# Patient Record
Sex: Male | Born: 1960 | Race: White | Hispanic: No | Marital: Married | Smoking: Former smoker
Health system: Southern US, Community
[De-identification: ages and names within clinical notes are randomized; demographics above are authoritative.]

## PROBLEM LIST (undated history)

## (undated) DIAGNOSIS — C801 Malignant (primary) neoplasm, unspecified: Secondary | ICD-10-CM

## (undated) DIAGNOSIS — Z803 Family history of malignant neoplasm of breast: Secondary | ICD-10-CM

## (undated) DIAGNOSIS — Z800 Family history of malignant neoplasm of digestive organs: Secondary | ICD-10-CM

## (undated) DIAGNOSIS — Z8371 Family history of colonic polyps: Secondary | ICD-10-CM

## (undated) DIAGNOSIS — K219 Gastro-esophageal reflux disease without esophagitis: Secondary | ICD-10-CM

## (undated) DIAGNOSIS — Z83719 Family history of colon polyps, unspecified: Secondary | ICD-10-CM

## (undated) DIAGNOSIS — Z8042 Family history of malignant neoplasm of prostate: Secondary | ICD-10-CM

## (undated) DIAGNOSIS — F419 Anxiety disorder, unspecified: Secondary | ICD-10-CM

## (undated) HISTORY — DX: Family history of malignant neoplasm of prostate: Z80.42

## (undated) HISTORY — DX: Family history of colon polyps, unspecified: Z83.719

## (undated) HISTORY — DX: Family history of colonic polyps: Z83.71

## (undated) HISTORY — DX: Family history of malignant neoplasm of digestive organs: Z80.0

## (undated) HISTORY — PX: VASECTOMY: SHX75

## (undated) HISTORY — DX: Family history of malignant neoplasm of breast: Z80.3

---

## 2018-06-06 DIAGNOSIS — C801 Malignant (primary) neoplasm, unspecified: Secondary | ICD-10-CM

## 2018-06-06 HISTORY — DX: Malignant (primary) neoplasm, unspecified: C80.1

## 2018-06-07 ENCOUNTER — Inpatient Hospital Stay (HOSPITAL_COMMUNITY)
Admission: EM | Admit: 2018-06-07 | Discharge: 2018-06-11 | DRG: 435 | Disposition: A | Discharge status: Home / Self Care | Payer: Managed Care, Other (non HMO) | Attending: Family Medicine | Admitting: Family Medicine

## 2018-06-07 ENCOUNTER — Encounter (HOSPITAL_COMMUNITY): Payer: Self-pay | Admitting: Emergency Medicine

## 2018-06-07 ENCOUNTER — Emergency Department (HOSPITAL_COMMUNITY): Payer: Managed Care, Other (non HMO)

## 2018-06-07 DIAGNOSIS — F17210 Nicotine dependence, cigarettes, uncomplicated: Secondary | ICD-10-CM | POA: Diagnosis present

## 2018-06-07 DIAGNOSIS — L298 Other pruritus: Secondary | ICD-10-CM | POA: Diagnosis present

## 2018-06-07 DIAGNOSIS — Z720 Tobacco use: Secondary | ICD-10-CM

## 2018-06-07 DIAGNOSIS — C250 Malignant neoplasm of head of pancreas: Secondary | ICD-10-CM | POA: Diagnosis not present

## 2018-06-07 DIAGNOSIS — Z800 Family history of malignant neoplasm of digestive organs: Secondary | ICD-10-CM

## 2018-06-07 DIAGNOSIS — K831 Obstruction of bile duct: Secondary | ICD-10-CM | POA: Diagnosis present

## 2018-06-07 DIAGNOSIS — Z23 Encounter for immunization: Secondary | ICD-10-CM

## 2018-06-07 DIAGNOSIS — T508X5A Adverse effect of diagnostic agents, initial encounter: Secondary | ICD-10-CM | POA: Diagnosis present

## 2018-06-07 DIAGNOSIS — R17 Unspecified jaundice: Secondary | ICD-10-CM | POA: Diagnosis present

## 2018-06-07 DIAGNOSIS — K8689 Other specified diseases of pancreas: Secondary | ICD-10-CM

## 2018-06-07 DIAGNOSIS — K859 Acute pancreatitis without necrosis or infection, unspecified: Secondary | ICD-10-CM | POA: Diagnosis present

## 2018-06-07 LAB — GAMMA GT: GGT: 1038 U/L — ABNORMAL HIGH (ref 7–50)

## 2018-06-07 LAB — COMPREHENSIVE METABOLIC PANEL
ALT: 541 U/L — ABNORMAL HIGH (ref 0–44)
ANION GAP: 8 (ref 5–15)
AST: 232 U/L — ABNORMAL HIGH (ref 15–41)
Albumin: 3.7 g/dL (ref 3.5–5.0)
Alkaline Phosphatase: 306 U/L — ABNORMAL HIGH (ref 38–126)
BUN: 9 mg/dL (ref 6–20)
CHLORIDE: 108 mmol/L (ref 98–111)
CO2: 22 mmol/L (ref 22–32)
Calcium: 9.3 mg/dL (ref 8.9–10.3)
Creatinine, Ser: 0.77 mg/dL (ref 0.61–1.24)
GFR calc non Af Amer: >60 mL/min (ref >60)
Glucose, Bld: 115 mg/dL — ABNORMAL HIGH (ref 70–99)
Potassium: 3.9 mmol/L (ref 3.5–5.1)
SODIUM: 138 mmol/L (ref 135–145)
Total Bilirubin: 10.7 mg/dL — ABNORMAL HIGH (ref 0.3–1.2)
Total Protein: 7.2 g/dL (ref 6.5–8.1)

## 2018-06-07 LAB — CBC
HEMATOCRIT: 44.5 % (ref 39.0–52.0)
Hemoglobin: 14.6 g/dL (ref 13.0–17.0)
MCH: 30 pg (ref 26.0–34.0)
MCHC: 32.8 g/dL (ref 30.0–36.0)
MCV: 91.6 fL (ref 80.0–100.0)
NRBC: 0 % (ref 0.0–0.2)
Platelets: 174 10*3/uL (ref 150–400)
RBC: 4.86 MIL/uL (ref 4.22–5.81)
RDW: 14.2 % (ref 11.5–15.5)
WBC: 5 10*3/uL (ref 4.0–10.5)

## 2018-06-07 LAB — BILIRUBIN, FRACTIONATED(TOT/DIR/INDIR)
BILIRUBIN DIRECT: 6.9 mg/dL — AB (ref 0.0–0.2)
BILIRUBIN INDIRECT: 4.3 mg/dL — AB (ref 0.3–0.9)
Total Bilirubin: 11.2 mg/dL — ABNORMAL HIGH (ref 0.3–1.2)

## 2018-06-07 LAB — URINALYSIS, ROUTINE W REFLEX MICROSCOPIC
BACTERIA UA: NONE SEEN
Bilirubin Urine: NEGATIVE
Glucose, UA: NEGATIVE mg/dL
Hgb urine dipstick: NEGATIVE
Ketones, ur: NEGATIVE mg/dL
LEUKOCYTES UA: NEGATIVE
Nitrite: NEGATIVE
PROTEIN: NEGATIVE mg/dL
Specific Gravity, Urine: 1.004 — ABNORMAL LOW (ref 1.005–1.030)
pH: 5 (ref 5.0–8.0)

## 2018-06-07 LAB — PROTIME-INR
INR: 0.98
Prothrombin Time: 12.9 seconds (ref 11.4–15.2)

## 2018-06-07 LAB — LIPASE, BLOOD: LIPASE: 41 U/L (ref 11–51)

## 2018-06-07 MED ORDER — METHYLPREDNISOLONE SODIUM SUCC 125 MG IJ SOLR
125.0000 mg | Freq: Once | INTRAMUSCULAR | Status: AC
  Administered 2018-06-07: 125 mg via INTRAVENOUS
  Filled 2018-06-07: qty 2

## 2018-06-07 MED ORDER — DIPHENHYDRAMINE HCL 50 MG/ML IJ SOLN
50.0000 mg | Freq: Once | INTRAMUSCULAR | Status: AC
  Administered 2018-06-07: 50 mg via INTRAVENOUS
  Filled 2018-06-07: qty 1

## 2018-06-07 MED ORDER — SODIUM CHLORIDE 0.9 % IV BOLUS
1000.0000 mL | Freq: Once | INTRAVENOUS | Status: AC
  Administered 2018-06-07: 1000 mL via INTRAVENOUS

## 2018-06-07 MED ORDER — IOHEXOL 300 MG/ML  SOLN
100.0000 mL | Freq: Once | INTRAMUSCULAR | Status: AC | PRN
  Administered 2018-06-07: 100 mL via INTRAVENOUS

## 2018-06-07 NOTE — ED Notes (Signed)
Family at bedside. 

## 2018-06-07 NOTE — ED Provider Notes (Signed)
[facility] EMERGENCY DEPARTMENT Provider Note   CSN: [id number] Arrival date & time: 06/07/18  1524     History   Chief Complaint Chief Complaint  Patient presents with  . Abdominal Pain    HPI Ernest Wu is a 57 y.o. male.  Patient is a 57 year old male presenting with 1 week of orange-tinged urine and concerns of jaundice after being seen at a UC.  Patient has an unremarkable medical history.  Patient is presenting today with acute onset jaundice over the last 24 hours.  He reports 2 weeks of generalized fatigue in 1 week of vague abdominal cramping, diarrhea and orange-tinged urine.  Patient also reports a 10 pound weight loss over unspecified time since patient does not "check his weight often."  He has diarrhea about 4 times per day but denies nausea or vomiting.  He does report some subjective fevers and chills but denies cough or hemoptysis.  He recently established with a PCP but no prior care during the majority of his lifetime.  Patient does report significant alcohol intake with approximately 4-6 beers daily.  He denies history of withdrawal.  Patient is a current everyday smoker with 1 pack/day since age 31.  He denies current or prior history of illicit drug use or IV drug use.  Patient does not have history of tattoos.  He has no recent travel outside of [place].  He currently works as a Games developer.  He denies prior history of jaundice, even as a child.     History reviewed. No pertinent past medical history.  Patient Active Problem List   Diagnosis Date Noted  . Jaundice 06/07/2018    Past Surgical History:  Procedure Laterality Date  . VASECTOMY          Home Medications    Prior to Admission medications   Medication Sig Start Date End Date Taking? Authorizing Provider  ibuprofen (ADVIL,MOTRIN) 200 MG tablet Take 800 mg by mouth every 6 (six) hours as needed (for pain or headaches).   Yes [provider]  omeprazole  (PRILOSEC OTC) 20 MG tablet Take 20 mg by mouth daily as needed (for heartburn).   Yes [provider]    Family History History reviewed. No pertinent family history.  Social History Social History   Tobacco Use  . Smoking status: Current Every Day Smoker    Packs/day: 1.00  . Smokeless tobacco: Never Used  Substance Use Topics  . Alcohol use: Yes    Alcohol/week: 4.0 - 5.0 standard drinks    Types: 4 - 5 Cans of beer per week  . Drug use: Never     Allergies   Iohexol and Contrast media [iodinated diagnostic agents]   Review of Systems Review of Systems  Constitutional: Positive for appetite change, chills, fatigue, fever and unexpected weight change.  HENT: Negative for congestion and sore throat.   Eyes: Negative for photophobia and visual disturbance.  Respiratory: Negative for cough and shortness of breath.   Cardiovascular: Negative for chest pain and palpitations.  Gastrointestinal: Positive for abdominal pain, blood in stool and diarrhea. Negative for nausea and vomiting.  Genitourinary: Positive for frequency. Negative for dysuria and hematuria.  Musculoskeletal: Negative for myalgias and neck pain.  Skin: Positive for color change. Negative for rash.  Neurological: Positive for headaches. Negative for dizziness, syncope and light-headedness.     Physical Exam Updated Vital Signs BP 139/84   Pulse 87   Temp 98.2 F (36.8 C) (Oral)  Resp 18   SpO2 99%   Physical Exam  Constitutional: He is oriented to person, place, and time. He appears well-developed and well-nourished.  Non-toxic appearance. No distress.  HENT:  Head: Normocephalic and atraumatic.  Eyes: Conjunctivae are normal. Scleral icterus is present.  Neck: Neck supple.  Cardiovascular: Normal rate and regular rhythm.  No murmur heard. Pulmonary/Chest: Effort normal and breath sounds normal. No respiratory distress.  Abdominal: Soft. Bowel sounds are normal. He exhibits distension.  He exhibits no mass. There is no hepatosplenomegaly. There is no tenderness. There is no rebound, no guarding, no tenderness at McBurney's point and negative Murphy's sign.  Musculoskeletal: He exhibits no edema.  Neurological: He is alert and oriented to person, place, and time.  Skin: Skin is warm and dry.  Jaundice  Psychiatric: He has a normal mood and affect.  Nursing note and vitals reviewed.    ED Treatments / Results  Labs (all labs ordered are listed, but only abnormal results are displayed) Labs Reviewed  COMPREHENSIVE METABOLIC PANEL - Abnormal; Notable for the following components:      Result Value   Glucose, Bld 115 (*)    AST 232 (*)    ALT 541 (*)    Alkaline Phosphatase 306 (*)    Total Bilirubin 10.7 (*)    All other components within normal limits  URINALYSIS, ROUTINE W REFLEX MICROSCOPIC - Abnormal; Notable for the following components:   Color, Urine AMBER (*)    Specific Gravity, Urine 1.004 (*)    All other components within normal limits  BILIRUBIN, FRACTIONATED(TOT/DIR/INDIR) - Abnormal; Notable for the following components:   Total Bilirubin 11.2 (*)    Bilirubin, Direct 6.9 (*)    Indirect Bilirubin 4.3 (*)    All other components within normal limits  GAMMA GT - Abnormal; Notable for the following components:   GGT 1,038 (*)    All other components within normal limits  LIPASE, BLOOD  CBC  PROTIME-INR  HEPATITIS PANEL, ACUTE  HIV ANTIBODY (ROUTINE TESTING W REFLEX)    EKG None  Radiology No results found.  Procedures Procedures (including critical care time)  Medications Ordered in ED Medications  iohexol (OMNIPAQUE) 300 MG/ML solution 100 mL (100 mLs Intravenous Contrast Given 06/07/18 2239)  diphenhydrAMINE (BENADRYL) injection 50 mg (50 mg Intravenous Given 06/07/18 2303)  methylPREDNISolone sodium succinate (SOLU-MEDROL) 125 mg/2 mL injection 125 mg (125 mg Intravenous Given 06/07/18 2307)     Initial Impression / Assessment and  Plan / ED Course  I have reviewed the triage vital signs and the nursing notes.  Pertinent labs & imaging results that were available during my care of the patient were reviewed by me and considered in my medical decision making (see chart for details).  Patient is a 57 year old male presenting with 1 week of orange-tinged urine and concerns of jaundice after being seen at a UC.  Patient has an unremarkable medical history.  Patient clearly jaundiced with nontender abdomen without palpable masses or hepatosplenomegaly.  Mentation appropriate.  He appears euvolemic despite significant history of diarrhea over the last week is without nausea or vomiting at present.  CBC WNL without signs of infection, anemia or thrombocytopenia.  CMET significant for elevated transaminases at AST 232, ALT 541, alk phos 306, total bili 10.7, albumin 3.7.  Kidney function preserved with creatinine 0.77.  Electrolytes WNL.  Lipase negative at 41.  Work-up concerning for GI malignancy given significant alcohol and tobacco use along with nontender abdomen and jaundice.  Will evaluate with CT of abdomen and pelvis with contrast.  Potential also includes hepatitis.  Will check hepatic panel HIV.  Checking fractionated bilirubin to help differentiate source.  Elevated alk phos related to alcohol use with GGT 1038.  INR 0.98.  Bilirubin seems to be mixed with direct 6.9 and indirect 4.3 for a total of 11.2.  This is concerning for obstruction.  Awaiting CT/pelvis results.  HIV and hepatitis panel pending.    Patient did have reaction to CT contrast dye his symptoms of burning sensation throughout and pruritus.  Patient given IV fluid bolus, dose of Solu-Medrol, and Benadryl.    Patient reports improvement of contrast reaction but does have some associated pruritus.  Decision was made to admit due to concern for malignancy and possible GI work-up in the morning.  FPTS consulted for possible admission for direct hyperbilirubinemia  and jaundice.  Final Clinical Impressions(s) / ED Diagnoses   Final diagnoses:  Direct hyperbilirubinemia    ED Discharge Orders    None       Ellsworth Bing, DO 06/07/18 2338    Elnora Morrison, MD 06/07/18 2350

## 2018-06-07 NOTE — ED Notes (Signed)
Patient transported to CT 

## 2018-06-07 NOTE — ED Triage Notes (Addendum)
Pt presents to ED for assessment after going to [facility] this morning for orange colored urine x 1+ weeks.  Patient sent here for further evaluation of jaundice.  Patient's skin appears slightly orange.  Patient c/o diarrhea for sam period of time, some burning with urination.  Denies nausea or vomiting.

## 2018-06-07 NOTE — H&P (Signed)
[facility] Admission History and Physical Service Pager: [phone]  Patient name: Ernest Wu Medical record number: [id number] Date of birth: [birth year] Age: 57 y.o. Gender: male  Primary Care Provider: System, Pcp Not In Consultants: None Code Status: Full  Chief Complaint: jaundice   Assessment and Plan: Ernest Wu is a 57 y.o. male presenting with jaundice . No significant PMH.  Pancreatic mass Patient presenting with symptoms of jaundice, tea colored urine, clay colored stools, 10 pound weight loss.  Symptoms likely related to mass around the neck of the pancreas.  CT abdomen in ED revealed low-attenuation mass centered around the neck of the pancreas concerning for neoplasm, pancreatic adenocarcinoma favored.  CT also showed common bile duct obstruction with mild intrahepatic biliary ductal dilatation, and involvement of the portal venous confluence.  Lab work also consistent with these findings with elevated direct bilirubin of 6.9 and total bili elevated to 11.2 showing likely obstructed source.  GGT also elevated to 1038. PT/INR normal. UA negative but amber color.  CT findings giving source of likely obstructive jaundice. -admit to med-surg, attending Dr. Gwendlyn Deutscher -AM consult to GI, may need to involve IR as well -AM CBC, CMP -can consider non-contrast MRI pending GI consult  -vitals per routine  -NPO at midnight to prepare for possible AM procedures by GI/IR -ibuprofen PRN, avoid tylenol   Contrast allergy With allergy to contrast dye from CT scan.  Patient has never had contrast in the past.  I have added this to his allergy list.  Status post Benadryl, Solu-Medrol, fluid bolus in the ED. Vitals stable -monitor closely  -can give additional dose of benadryl if needed -can consider epi pen if worsening  Tobacco use 1ppd tobacco use since age 68  -nicotine patch -encourage cessation   Alcohol use Drinks 4-6 beers/day   -CIWA -thiamine -folic acid  -MVN  FEN/GI: NPO Prophylaxis: lovenox   Disposition: admit to med-surg, attending Dr. Gwendlyn Deutscher   History of Present Illness:  Ernest Wu is a 57 y.o. male presenting with jaundice  Patient presenting today with complaints of skin turning yellow since Friday morning.  Patient also presenting with dark urine for the past week and a half.  States that his urine looks like "a glass of tea".  Along with symptoms of change in urine color patient also had change in stool color.  States it looks like "like colored clay".  Patient notes that the inside of stool had a reddish color to it.  Patient reports associated abdominal pain as well.  States that it is in the lower center of his stomach.  No other past medical history.  Patient has never had any blood transfusions, no recent travel, no tattoos, no shared needles, no illicit drug use.  Patient does drink 4-6 beers a day and smokes 1 pack/day cigarettes since 57 years old.  In the ED patient reported that he had 10 pound weight loss.  Lab work in ED showed elevated bilirubin to 11.2 with direct elevated to 6.9 and indirect elevated to 4.3.  GGT elevated to 1038.  PT and INR within normal limits.  CT abdomen pelvis showed a low-attenuation mass centered around the neck of the pancreas concerning for neoplasm.  Pancreatic adenocarcinoma favored.  This results and common bile duct obstruction with mild intrahepatic biliary ductal dilation.  There was also involvement of the portal venous confluence.  Recommended nonemergent contrast-enhanced MRI of the pancreas. In ED patient had allergic reaction to contrast  from CT scan. S/p Benadryl, Solu-Medrol, fluid bolus in the ED.  Review Of Systems: Per HPI with the following additions:    Review of Systems  Constitutional: Positive for weight loss.  Respiratory: Negative for shortness of breath.   Cardiovascular: Negative for chest pain.  Gastrointestinal: Positive for  abdominal pain.  Genitourinary: Negative for dysuria.  Skin: Positive for itching.    Patient Active Problem List   Diagnosis Date Noted  . Jaundice 06/07/2018    Past Medical History: History reviewed. No pertinent past medical history.  Past Surgical History: Past Surgical History:  Procedure Laterality Date  . VASECTOMY      Social History: Social History   Tobacco Use  . Smoking status: Current Every Day Smoker    Packs/day: 1.00  . Smokeless tobacco: Never Used  Substance Use Topics  . Alcohol use: Yes    Alcohol/week: 4.0 - 5.0 standard drinks    Types: 4 - 5 Cans of beer per week  . Drug use: Never   Additional social history: 1ppd tobacco since 57 y.o., 4-6 beers/day, no illicit drug use   Please also refer to relevant sections of EMR.  Family History: History reviewed. No pertinent family history.  Allergies and Medications: Allergies  Allergen Reactions  . Iohexol Anaphylaxis, Itching and Rash  . Contrast Media [Iodinated Diagnostic Agents]     Burning sensation   No current facility-administered medications on file prior to encounter.    Current Outpatient Medications on File Prior to Encounter  Medication Sig Dispense Refill  . ibuprofen (ADVIL,MOTRIN) 200 MG tablet Take 800 mg by mouth every 6 (six) hours as needed (for pain or headaches).    Marland Kitchen omeprazole (PRILOSEC OTC) 20 MG tablet Take 20 mg by mouth daily as needed (for heartburn).      Objective: BP (!) 141/88 (BP Location: Right Arm)   Pulse 81   Temp 98.3 F (36.8 C) (Oral)   Resp 18   Ht 6' (1.829 m)   Wt 88.3 kg   SpO2 98%   BMI 26.40 kg/m  Exam: General: awake and alert, laying in bed, family at bedside  Eyes: PERRL, EOMI, scleral icterus  ENTM: moist mucous membranes Neck: supple, no LAD Cardiovascular: RRR, no MRG  Respiratory: CTAB, no wheezes, rales, or rhonchi  Gastrointestinal: soft, non tender, non distended, no HSM, bowel sounds normal  MSK: no edema, 5/5 muscle  strength, normal grip strength  Derm: jaundice throughout body, no rashes Neuro: no focal deficits, CN2-12 intact, oriented x3 Psych: normal affect   Labs and Imaging: CBC BMET  Recent Labs  Lab 06/07/18 1604  WBC 5.0  HGB 14.6  HCT 44.5  PLT 174   Recent Labs  Lab 06/07/18 1604  NA 138  K 3.9  CL 108  CO2 22  BUN 9  CREATININE 0.77  GLUCOSE 115*  CALCIUM 9.3     CMP Latest Ref Rng & Units 06/07/2018 06/07/2018  Glucose 70 - 99 mg/dL - 115(H)  BUN 6 - 20 mg/dL - 9  Creatinine 0.61 - 1.24 mg/dL - 0.77  Sodium 135 - 145 mmol/L - 138  Potassium 3.5 - 5.1 mmol/L - 3.9  Chloride 98 - 111 mmol/L - 108  CO2 22 - 32 mmol/L - 22  Calcium 8.9 - 10.3 mg/dL - 9.3  Total Protein 6.5 - 8.1 g/dL - 7.2  Total Bilirubin 0.3 - 1.2 mg/dL 11.2(H) 10.7(H)  Alkaline Phos 38 - 126 U/L - 306(H)  AST 15 -  41 U/L - 232(H)  ALT 0 - 44 U/L - 541(H)     Ref. Range 06/07/2018 19:15  Prothrombin Time Latest Ref Range: 11.4 - 15.2 seconds 12.9  INR Unknown 0.98     Ref. Range 06/07/2018 19:15  Bilirubin, Direct Latest Ref Range: 0.0 - 0.2 mg/dL 6.9 (H)  Indirect Bilirubin Latest Ref Range: 0.3 - 0.9 mg/dL 4.3 (H)  Total Bilirubin Latest Ref Range: 0.3 - 1.2 mg/dL 11.2 (H)  GGT Latest Ref Range: 7 - 50 U/L 1,038 (H)   Urinalysis    Component Value Date/Time   COLORURINE AMBER (A) 06/07/2018 1604   APPEARANCEUR CLEAR 06/07/2018 1604   LABSPEC 1.004 (L) 06/07/2018 1604   PHURINE 5.0 06/07/2018 1604   GLUCOSEU NEGATIVE 06/07/2018 1604   HGBUR NEGATIVE 06/07/2018 1604   BILIRUBINUR NEGATIVE 06/07/2018 1604   KETONESUR NEGATIVE 06/07/2018 1604   PROTEINUR NEGATIVE 06/07/2018 1604   NITRITE NEGATIVE 06/07/2018 1604   LEUKOCYTESUR NEGATIVE 06/07/2018 1604     Ct Abdomen Pelvis W Contrast  Result Date: 06/07/2018 CLINICAL DATA:  Oranged colored urine.  Jaundice EXAM: CT ABDOMEN AND PELVIS WITH CONTRAST TECHNIQUE: Multidetector CT imaging of the abdomen and pelvis was performed using the  standard protocol following bolus administration of intravenous contrast. CONTRAST:  12mL OMNIPAQUE IOHEXOL 300 MG/ML  SOLN COMPARISON:  None FINDINGS: Lower chest: No acute abnormality. Hepatobiliary: Small intermediate attenuating structure within segment 4a measures 8 mm, image 15/3. Indeterminate. There is mild intrahepatic bile duct dilatation. Moderate distension of the gallbladder. The common bile duct is increased in caliber measuring 1.6 cm. The dilated CBD terminates abruptly at the level of the head of pancreas, image 45/6. Pancreas: There is a mass centered around the neck of pancreas which measures 3.5 by 2.3 by 2.1 cm. Atrophy of the body and tail of pancreas with increase caliber of the main duct is noted. Encasement of the portal venous confluence is suspected. The main portal vein and the splenic vein remain patent. The celiac trunk and SMA appear uninvolved. Spleen: Normal in size without focal abnormality. Adrenals/Urinary Tract: The adrenal glands appear normal. Small hypodensity within the posterior cortex of the right kidney measures 6 mm, image 45/3. Too small to characterize. No hydronephrosis. Urinary bladder appears normal. Stomach/Bowel: The stomach and the small bowel loops are unremarkable. The appendix is visualized and appears normal. Unremarkable appearance of the colon. Vascular/Lymphatic: Aortic atherosclerosis without aneurysm. No adenopathy. Reproductive: Prostate is unremarkable. Other: No ascites.  No peritoneal nodule or mass Musculoskeletal: No acute or significant osseous findings. IMPRESSION: 1. There is a low attenuation mass centered around the neck of pancreas which is concerning for neoplasm. Pancreatic adenocarcinoma favored. This results in common bile duct obstruction with mild intrahepatic biliary ductal dilatation. There also is involvement of the portal venous confluence. Further evaluation with nonemergent contrast enhanced MRI of the pancreas is recommended. 2.  Small indeterminate low-attenuation structure is noted within segment 4 a of the liver. This could be better addressed at MRI. Electronically Signed   By: Kerby Moors M.D.   On: 06/07/2018 23:37     Caroline More, DO 06/08/2018, 12:44 AM PGY-2, [place] [facility] pager: [phone], text pages welcome

## 2018-06-08 ENCOUNTER — Other Ambulatory Visit: Payer: Self-pay

## 2018-06-08 DIAGNOSIS — R17 Unspecified jaundice: Secondary | ICD-10-CM

## 2018-06-08 LAB — CBC
HCT: 41.6 % (ref 39.0–52.0)
HEMOGLOBIN: 13.5 g/dL (ref 13.0–17.0)
MCH: 29.4 pg (ref 26.0–34.0)
MCHC: 32.5 g/dL (ref 30.0–36.0)
MCV: 90.6 fL (ref 80.0–100.0)
NRBC: 0 % (ref 0.0–0.2)
PLATELETS: 161 10*3/uL (ref 150–400)
RBC: 4.59 MIL/uL (ref 4.22–5.81)
RDW: 14.2 % (ref 11.5–15.5)
WBC: 3.5 10*3/uL — ABNORMAL LOW (ref 4.0–10.5)

## 2018-06-08 LAB — COMPREHENSIVE METABOLIC PANEL
ALK PHOS: 274 U/L — AB (ref 38–126)
ALT: 569 U/L — AB (ref 0–44)
AST: 277 U/L — AB (ref 15–41)
Albumin: 3.2 g/dL — ABNORMAL LOW (ref 3.5–5.0)
Anion gap: 8 (ref 5–15)
BUN: 7 mg/dL (ref 6–20)
CALCIUM: 8.8 mg/dL — AB (ref 8.9–10.3)
CHLORIDE: 107 mmol/L (ref 98–111)
CO2: 21 mmol/L — AB (ref 22–32)
CREATININE: 0.76 mg/dL (ref 0.61–1.24)
GFR calc Af Amer: >60 mL/min (ref >60)
GFR calc non Af Amer: >60 mL/min (ref >60)
GLUCOSE: 182 mg/dL — AB (ref 70–99)
Potassium: 3.7 mmol/L (ref 3.5–5.1)
SODIUM: 136 mmol/L (ref 135–145)
Total Bilirubin: 10 mg/dL — ABNORMAL HIGH (ref 0.3–1.2)
Total Protein: 6.3 g/dL — ABNORMAL LOW (ref 6.5–8.1)

## 2018-06-08 LAB — HIV ANTIBODY (ROUTINE TESTING W REFLEX): HIV Screen 4th Generation wRfx: NONREACTIVE

## 2018-06-08 MED ORDER — LORAZEPAM 2 MG/ML IJ SOLN: 0.5000 mg | Freq: Once | INTRAMUSCULAR | Status: DC

## 2018-06-08 MED ORDER — DIPHENHYDRAMINE HCL 50 MG/ML IJ SOLN: 50.0000 mg | Freq: Once | INTRAMUSCULAR | Status: AC

## 2018-06-08 MED ORDER — THIAMINE HCL 100 MG/ML IJ SOLN
100.0000 mg | Freq: Every day | INTRAMUSCULAR | Status: DC
  Filled 2018-06-08: qty 2

## 2018-06-08 MED ORDER — VITAMIN B-1 100 MG PO TABS
100.0000 mg | ORAL_TABLET | Freq: Every day | ORAL | Status: DC
  Administered 2018-06-08 – 2018-06-11 (×4): 100 mg via ORAL
  Filled 2018-06-08 (×4): qty 1

## 2018-06-08 MED ORDER — NICOTINE 7 MG/24HR TD PT24
7.0000 mg | MEDICATED_PATCH | Freq: Every day | TRANSDERMAL | Status: DC
  Filled 2018-06-08 (×4): qty 1

## 2018-06-08 MED ORDER — ADULT MULTIVITAMIN W/MINERALS CH
1.0000 | ORAL_TABLET | Freq: Every day | ORAL | Status: DC
  Administered 2018-06-08 – 2018-06-11 (×4): 1 via ORAL
  Filled 2018-06-08 (×4): qty 1

## 2018-06-08 MED ORDER — IBUPROFEN 400 MG PO TABS
400.0000 mg | ORAL_TABLET | Freq: Four times a day (QID) | ORAL | Status: DC | PRN
  Administered 2018-06-10 – 2018-06-11 (×2): 400 mg via ORAL
  Filled 2018-06-08 (×2): qty 1

## 2018-06-08 MED ORDER — PREDNISONE 20 MG PO TABS
50.0000 mg | ORAL_TABLET | Freq: Four times a day (QID) | ORAL | Status: AC
  Administered 2018-06-08 – 2018-06-09 (×3): 50 mg via ORAL
  Filled 2018-06-08 (×3): qty 2

## 2018-06-08 MED ORDER — DIPHENHYDRAMINE HCL 50 MG/ML IJ SOLN: 25.0000 mg | Freq: Once | INTRAMUSCULAR | Status: DC | PRN

## 2018-06-08 MED ORDER — DIPHENHYDRAMINE HCL 25 MG PO CAPS
50.0000 mg | ORAL_CAPSULE | Freq: Once | ORAL | Status: AC
  Administered 2018-06-09: 50 mg via ORAL
  Filled 2018-06-08: qty 2

## 2018-06-08 MED ORDER — ENOXAPARIN SODIUM 40 MG/0.4ML ~~LOC~~ SOLN
40.0000 mg | SUBCUTANEOUS | Status: DC
  Administered 2018-06-08 – 2018-06-09 (×2): 40 mg via SUBCUTANEOUS
  Filled 2018-06-08 (×2): qty 0.4

## 2018-06-08 MED ORDER — SODIUM CHLORIDE 0.9 % IV SOLN
INTRAVENOUS | Status: DC
  Administered 2018-06-08 – 2018-06-11 (×3): via INTRAVENOUS

## 2018-06-08 MED ORDER — POLYETHYLENE GLYCOL 3350 17 G PO PACK: 17.0000 g | PACK | Freq: Every day | ORAL | Status: DC | PRN

## 2018-06-08 MED ORDER — FOLIC ACID 1 MG PO TABS
1.0000 mg | ORAL_TABLET | Freq: Every day | ORAL | Status: DC
  Administered 2018-06-08 – 2018-06-11 (×4): 1 mg via ORAL
  Filled 2018-06-08 (×4): qty 1

## 2018-06-08 NOTE — Progress Notes (Signed)
FPTS Interim Progress Note  Spoke to Dr. Leonia Reeves, radiology, to discuss further imaging. Per Dr. Leonia Reeves patient will not need further imaging with MRI to avoid possible reaction from contrast. No further imaging needed at this time, future imaging per GI recommendations.   Caroline More, DO 06/08/2018, 7:36 AM PGY-2, [facility] Service pager [phone]

## 2018-06-08 NOTE — Progress Notes (Addendum)
Family Medicine Teaching Service Daily Progress Note Intern Pager: [phone]  Patient name: Ernest Wu Medical record number: [id number] Date of birth: January 27, 1961 Age: 57 y.o. Gender: male  Primary Care Provider: System, Pcp Not In Consultants: GI Code Status: full  Pt Overview and Major Events to Date:  11/2 Admitted, pancreatic mass visualized on CT  Assessment and Plan:  Ernest Wu is a 57 y.o. male presenting with jaundice . No significant PMH.  Pancreatic mass Likely pancreatic adenocarcinoma visualized on CT abdomen/pelvis with contrast on admission.  Patient has had weight loss and jaundice, likely due to obstruction of common bile duct from mass.  Mixed direct and indirect hyperbilirubinemia also indicative of obstructive process. -GI consult: recommend MRI and MRCP per conversation over the phone, will await note for further instructions -daily CBC, CMP -follow up HIV, hepatitis panel, although source of hyperbilirubinemia is known -vitals per routine  -continue NPO status pending plan for possible GI/IR procedure -ibuprofen PRN, avoid tylenol   Iodine contrast allergy Burning and pruritus shortly after administration of the CT contrast.  Patient given Solu-Medrol, fluid bolus, and Benadryl.  Contrast allergy added to problem list.  According to multiple literature sources, patient does not require premedication for gadolinium based contrast prior to his MRI.  Tobacco use 1ppd tobacco use since age 71  -nicotine patch -encourage cessation   Alcohol use Drinks 4-6 beers/day.  GGT 1038 on admission, indicating recent alcohol use.  CIWA scores have been 0 overnight -CIWA -thiamine -folic acid  -multivitamin  FEN/GI: NPO PPx: lovenox  Disposition: continue on med-surg  Subjective:  Patient has no complaints this morning.  He and his wife has been told that there is a spot on his pancreas that is concerning and that was the likely cause of his jaundice.   They understand that this spot may signify a cancerous lesion.  Objective: Temp:  [97.6 F (36.4 C)-98.3 F (36.8 C)] 97.6 F (36.4 C) (11/03 0550) Pulse Rate:  [61-95] 78 (11/03 0550) Resp:  [12-21] 16 (11/03 0550) BP: (120-157)/(75-100) 120/75 (11/03 0550) SpO2:  [97 %-99 %] 97 % (11/03 0550) Weight:  [88.3 kg] 88.3 kg (11/03 0010) Physical Exam: General: NAD, resting comfortably in bed, scleral icterus noted Cardiovascular: RRR, no MRG Respiratory: CTAP, no increased work of breathing Abdomen: Mildly tender in the RUQ, nondistended, soft, no hepatomegaly noted Extremities: Moves all extremities spontaneously, no pedal edema, warm and well-perfused  Laboratory: Recent Labs  Lab 06/07/18 1604 06/08/18 0550  WBC 5.0 3.5*  HGB 14.6 13.5  HCT 44.5 41.6  PLT 174 161   Recent Labs  Lab 06/07/18 1604 06/07/18 1915  NA 138  --   K 3.9  --   CL 108  --   CO2 22  --   BUN 9  --   CREATININE 0.77  --   CALCIUM 9.3  --   PROT 7.2  --   BILITOT 10.7* 11.2*  ALKPHOS 306*  --   ALT 541*  --   AST 232*  --   GLUCOSE 115*  --     Imaging/Diagnostic Tests: Ct Abdomen Pelvis W Contrast  Result Date: 06/07/2018 CLINICAL DATA:  Oranged colored urine.  Jaundice EXAM: CT ABDOMEN AND PELVIS WITH CONTRAST TECHNIQUE: Multidetector CT imaging of the abdomen and pelvis was performed using the standard protocol following bolus administration of intravenous contrast. CONTRAST:  116mL OMNIPAQUE IOHEXOL 300 MG/ML  SOLN COMPARISON:  None FINDINGS: Lower chest: No acute abnormality. Hepatobiliary: Small intermediate attenuating  structure within segment 4a measures 8 mm, image 15/3. Indeterminate. There is mild intrahepatic bile duct dilatation. Moderate distension of the gallbladder. The common bile duct is increased in caliber measuring 1.6 cm. The dilated CBD terminates abruptly at the level of the head of pancreas, image 45/6. Pancreas: There is a mass centered around the neck of pancreas which  measures 3.5 by 2.3 by 2.1 cm. Atrophy of the body and tail of pancreas with increase caliber of the main duct is noted. Encasement of the portal venous confluence is suspected. The main portal vein and the splenic vein remain patent. The celiac trunk and SMA appear uninvolved. Spleen: Normal in size without focal abnormality. Adrenals/Urinary Tract: The adrenal glands appear normal. Small hypodensity within the posterior cortex of the right kidney measures 6 mm, image 45/3. Too small to characterize. No hydronephrosis. Urinary bladder appears normal. Stomach/Bowel: The stomach and the small bowel loops are unremarkable. The appendix is visualized and appears normal. Unremarkable appearance of the colon. Vascular/Lymphatic: Aortic atherosclerosis without aneurysm. No adenopathy. Reproductive: Prostate is unremarkable. Other: No ascites.  No peritoneal nodule or mass Musculoskeletal: No acute or significant osseous findings. IMPRESSION: 1. There is a low attenuation mass centered around the neck of pancreas which is concerning for neoplasm. Pancreatic adenocarcinoma favored. This results in common bile duct obstruction with mild intrahepatic biliary ductal dilatation. There also is involvement of the portal venous confluence. Further evaluation with nonemergent contrast enhanced MRI of the pancreas is recommended. 2. Small indeterminate low-attenuation structure is noted within segment 4 a of the liver. This could be better addressed at MRI. Electronically Signed   By: Kerby Moors M.D.   On: 06/07/2018 23:37     Kathrene Alu, MD 06/08/2018, 7:29 AM PGY-2, [place] Intern pager: [phone], text pages welcome

## 2018-06-08 NOTE — Progress Notes (Signed)
Pt awake alert and denies c/o pain . Wife at bedside

## 2018-06-08 NOTE — Consult Note (Signed)
Referring Provider:  Dr. Ouida Sills Primary Care Physician:  System, Pcp Not In Primary Gastroenterologist: Althia Forts  Reason for Consultation: Pancreatic mass, obstructive jaundice  HPI: Ernest Wu is a 57 y.o. male presented to the hospital for further evaluation of jaundice.  Patient has been noticing abdominal bloating and some diarrhea for 2 weeks.  Subsequently started noticing dark urine around 1 week ago and yellowish discoloration of eyes 4 days ago.  He was seen in urgent care and was advised to come to the emergency room for further evaluation.  Upon initial evaluation, he was found to have jaundice with bilirubin of 10.7, and mild elevated AST ALT and alkaline phosphatase.  Normal lipase.  CT abdomen pelvis with IV contrast showed pancreatic neck mass 3.5 cm with possible encasement of the portal venous confluence, dilated CBD of up to 1.6 cm as well as indeterminant liver lesion.  GI is consulted for further evaluation.  Patient is a 8 now.  Diarrhea is resolved.  Now having 1-2 bowel movements.  Continues to have intermittent abdominal bloating.  He is complaining of 10 pound weight loss in the last 2 weeks.  He denies blood in the stool or black stool.  Family history of colon cancer or pancreatic cancer.  No previous colonoscopy.  History reviewed. No pertinent past medical history.  Past Surgical History:  Procedure Laterality Date  . VASECTOMY      Prior to Admission medications   Medication Sig Start Date End Date Taking? Authorizing Provider  ibuprofen (ADVIL,MOTRIN) 200 MG tablet Take 800 mg by mouth every 6 (six) hours as needed (for pain or headaches).   Yes [provider]  omeprazole (PRILOSEC OTC) 20 MG tablet Take 20 mg by mouth daily as needed (for heartburn).   Yes [provider]    Scheduled Meds: . enoxaparin (LOVENOX) injection  40 mg Subcutaneous Q24H  . folic acid  1 mg Oral Daily  . multivitamin with minerals  1 tablet Oral Daily   . nicotine  7 mg Transdermal Daily  . thiamine  100 mg Oral Daily   Or  . thiamine  100 mg Intravenous Daily   Continuous Infusions: . sodium chloride 75 mL/hr at 06/08/18 0557   PRN Meds:.diphenhydrAMINE, ibuprofen, polyethylene glycol  Allergies as of 06/07/2018 - Review Complete 06/07/2018  Allergen Reaction Noted  . Iohexol Anaphylaxis, Itching, and Rash 06/07/2018  . Contrast media [iodinated diagnostic agents]  06/07/2018    History reviewed. No pertinent family history.  Social History   Socioeconomic History  . Marital status: Married    Spouse name: Not on file  . Number of children: Not on file  . Years of education: Not on file  . Highest education level: Not on file  Occupational History  . Not on file  Social Needs  . Financial resource strain: Not on file  . Food insecurity:    Worry: Not on file    Inability: Not on file  . Transportation needs:    Medical: Not on file    Non-medical: Not on file  Tobacco Use  . Smoking status: Current Every Day Smoker    Packs/day: 1.00  . Smokeless tobacco: Never Used  Substance and Sexual Activity  . Alcohol use: Yes    Alcohol/week: 4.0 - 5.0 standard drinks    Types: 4 - 5 Cans of beer per week  . Drug use: Never  . Sexual activity: Not on file  Lifestyle  . Physical activity:  Days per week: Not on file    Minutes per session: Not on file  . Stress: Not on file  Relationships  . Social connections:    Talks on phone: Not on file    Gets together: Not on file    Attends religious service: Not on file    Active member of club or organization: Not on file    Attends meetings of clubs or organizations: Not on file    Relationship status: Not on file  . Intimate partner violence:    Fear of current or ex partner: Not on file    Emotionally abused: Not on file    Physically abused: Not on file    Forced sexual activity: Not on file  Other Topics Concern  . Not on file  Social History Narrative  .  Not on file    Review of Systems: Review of Systems  Constitutional: Positive for chills, malaise/fatigue and weight loss. Negative for fever.  HENT: Negative for hearing loss and tinnitus.   Eyes: Negative for blurred vision and double vision.  Respiratory: Negative for cough and hemoptysis.   Cardiovascular: Negative for chest pain and palpitations.  Gastrointestinal: Positive for diarrhea. Negative for blood in stool, constipation, heartburn, nausea and vomiting.  Genitourinary: Negative for dysuria and urgency.  Musculoskeletal: Positive for myalgias. Negative for neck pain.  Skin: Negative for itching and rash.  Neurological: Negative for sensory change and seizures.  Endo/Heme/Allergies: Does not bruise/bleed easily.  Psychiatric/Behavioral: Negative for hallucinations and suicidal ideas.    Physical Exam: Vital signs: Vitals:   06/08/18 0010 06/08/18 0550  BP: (!) 141/88 120/75  Pulse: 81 78  Resp: 18 16  Temp: 98.3 F (36.8 C) 97.6 F (36.4 C)  SpO2: 98% 97%   Last BM Date: 06/07/18 Physical Exam  Constitutional: He is oriented to person, place, and time. He appears well-developed and well-nourished. No distress.  HENT:  Head: Normocephalic and atraumatic.  Mouth/Throat: Oropharynx is clear and moist. No oropharyngeal exudate.  Eyes: EOM are normal. Scleral icterus is present.  Neck: Normal range of motion. Neck supple.  Cardiovascular: Normal rate, regular rhythm and normal heart sounds.  Pulmonary/Chest: Effort normal and breath sounds normal. No respiratory distress.  Abdominal: Soft. Bowel sounds are normal. He exhibits no distension. There is no tenderness. There is no rebound and no guarding.  Musculoskeletal: Normal range of motion. He exhibits no edema.  Neurological: He is alert and oriented to person, place, and time.  Skin: Skin is warm. No erythema.  Psychiatric: He has a normal mood and affect. Judgment and thought content normal.  Nursing note and  vitals reviewed.   GI:  Lab Results: Recent Labs    06/07/18 1604 06/08/18 0550  WBC 5.0 3.5*  HGB 14.6 13.5  HCT 44.5 41.6  PLT 174 161   BMET Recent Labs    06/07/18 1604 06/08/18 0550  NA 138 136  K 3.9 3.7  CL 108 107  CO2 22 21*  GLUCOSE 115* 182*  BUN 9 7  CREATININE 0.77 0.76  CALCIUM 9.3 8.8*   LFT Recent Labs    06/07/18 1915 06/08/18 0550  PROT  --  6.3*  ALBUMIN  --  3.2*  AST  --  277*  ALT  --  569*  ALKPHOS  --  274*  BILITOT 11.2* 10.0*  BILIDIR 6.9*  --   IBILI 4.3*  --    PT/INR Recent Labs    06/07/18 1915  LABPROT 12.9  INR 0.98     Studies/Results: Ct Abdomen Pelvis W Contrast  Result Date: 06/07/2018 CLINICAL DATA:  Oranged colored urine.  Jaundice EXAM: CT ABDOMEN AND PELVIS WITH CONTRAST TECHNIQUE: Multidetector CT imaging of the abdomen and pelvis was performed using the standard protocol following bolus administration of intravenous contrast. CONTRAST:  192mL OMNIPAQUE IOHEXOL 300 MG/ML  SOLN COMPARISON:  None FINDINGS: Lower chest: No acute abnormality. Hepatobiliary: Small intermediate attenuating structure within segment 4a measures 8 mm, image 15/3. Indeterminate. There is mild intrahepatic bile duct dilatation. Moderate distension of the gallbladder. The common bile duct is increased in caliber measuring 1.6 cm. The dilated CBD terminates abruptly at the level of the head of pancreas, image 45/6. Pancreas: There is a mass centered around the neck of pancreas which measures 3.5 by 2.3 by 2.1 cm. Atrophy of the body and tail of pancreas with increase caliber of the main duct is noted. Encasement of the portal venous confluence is suspected. The main portal vein and the splenic vein remain patent. The celiac trunk and SMA appear uninvolved. Spleen: Normal in size without focal abnormality. Adrenals/Urinary Tract: The adrenal glands appear normal. Small hypodensity within the posterior cortex of the right kidney measures 6 mm, image  45/3. Too small to characterize. No hydronephrosis. Urinary bladder appears normal. Stomach/Bowel: The stomach and the small bowel loops are unremarkable. The appendix is visualized and appears normal. Unremarkable appearance of the colon. Vascular/Lymphatic: Aortic atherosclerosis without aneurysm. No adenopathy. Reproductive: Prostate is unremarkable. Other: No ascites.  No peritoneal nodule or mass Musculoskeletal: No acute or significant osseous findings. IMPRESSION: 1. There is a low attenuation mass centered around the neck of pancreas which is concerning for neoplasm. Pancreatic adenocarcinoma favored. This results in common bile duct obstruction with mild intrahepatic biliary ductal dilatation. There also is involvement of the portal venous confluence. Further evaluation with nonemergent contrast enhanced MRI of the pancreas is recommended. 2. Small indeterminate low-attenuation structure is noted within segment 4 a of the liver. This could be better addressed at MRI. Electronically Signed   By: Kerby Moors M.D.   On: 06/07/2018 23:37    Impression/Plan: -Obstructive jaundice with pancreatic neck mass with CBD dilatation up to 1.6 cm and indeterminate liver lesion on CT scan.  Finding concerning for pancreatic cancer. -Allergic reaction to IV contrast used for CT scan  Recommendations ---------------------- -Start contrast allergy protocol with prednisone and Benadryl. -Get MRI MRCP with and without contrast for further evaluation of indeterminate liver lesion as well as further evaluation of common bile duct and pancreas. -He may need EUS as well as ERCP with possible stent placement which can be done as an outpatient. -CA 19-9. - GI will follow.    LOS: 0 days   Otis Brace  MD, FACP 06/08/2018, 9:21 AM  Contact #  [phone]

## 2018-06-09 ENCOUNTER — Encounter (HOSPITAL_COMMUNITY): Payer: Self-pay | Admitting: General Surgery

## 2018-06-09 ENCOUNTER — Observation Stay (HOSPITAL_COMMUNITY): Payer: Managed Care, Other (non HMO)

## 2018-06-09 DIAGNOSIS — L298 Other pruritus: Secondary | ICD-10-CM | POA: Diagnosis present

## 2018-06-09 DIAGNOSIS — Z720 Tobacco use: Secondary | ICD-10-CM | POA: Diagnosis not present

## 2018-06-09 DIAGNOSIS — R17 Unspecified jaundice: Secondary | ICD-10-CM | POA: Diagnosis not present

## 2018-06-09 DIAGNOSIS — K859 Acute pancreatitis without necrosis or infection, unspecified: Secondary | ICD-10-CM | POA: Diagnosis present

## 2018-06-09 DIAGNOSIS — Z23 Encounter for immunization: Secondary | ICD-10-CM | POA: Diagnosis not present

## 2018-06-09 DIAGNOSIS — D490 Neoplasm of unspecified behavior of digestive system: Secondary | ICD-10-CM | POA: Diagnosis not present

## 2018-06-09 DIAGNOSIS — C250 Malignant neoplasm of head of pancreas: Secondary | ICD-10-CM | POA: Diagnosis present

## 2018-06-09 DIAGNOSIS — Z800 Family history of malignant neoplasm of digestive organs: Secondary | ICD-10-CM | POA: Diagnosis not present

## 2018-06-09 DIAGNOSIS — T508X5A Adverse effect of diagnostic agents, initial encounter: Secondary | ICD-10-CM | POA: Diagnosis present

## 2018-06-09 DIAGNOSIS — F17210 Nicotine dependence, cigarettes, uncomplicated: Secondary | ICD-10-CM | POA: Diagnosis present

## 2018-06-09 DIAGNOSIS — K8689 Other specified diseases of pancreas: Secondary | ICD-10-CM | POA: Diagnosis not present

## 2018-06-09 DIAGNOSIS — K831 Obstruction of bile duct: Secondary | ICD-10-CM | POA: Diagnosis present

## 2018-06-09 LAB — COMPREHENSIVE METABOLIC PANEL
ALT: 584 U/L — AB (ref 0–44)
AST: 222 U/L — ABNORMAL HIGH (ref 15–41)
Albumin: 2.9 g/dL — ABNORMAL LOW (ref 3.5–5.0)
Alkaline Phosphatase: 242 U/L — ABNORMAL HIGH (ref 38–126)
Anion gap: 6 (ref 5–15)
BUN: <5 mg/dL — ABNORMAL LOW (ref 6–20)
CHLORIDE: 106 mmol/L (ref 98–111)
CO2: 25 mmol/L (ref 22–32)
CREATININE: 0.71 mg/dL (ref 0.61–1.24)
Calcium: 8.6 mg/dL — ABNORMAL LOW (ref 8.9–10.3)
GFR calc non Af Amer: >60 mL/min (ref >60)
Glucose, Bld: 150 mg/dL — ABNORMAL HIGH (ref 70–99)
Potassium: 3.3 mmol/L — ABNORMAL LOW (ref 3.5–5.1)
SODIUM: 137 mmol/L (ref 135–145)
Total Bilirubin: 6.3 mg/dL — ABNORMAL HIGH (ref 0.3–1.2)
Total Protein: 5.7 g/dL — ABNORMAL LOW (ref 6.5–8.1)

## 2018-06-09 LAB — CANCER ANTIGEN 19-9: CA 19-9: 105 U/mL — ABNORMAL HIGH (ref 0–35)

## 2018-06-09 MED ORDER — GADOBUTROL 1 MMOL/ML IV SOLN
8.0000 mL | Freq: Once | INTRAVENOUS | Status: AC | PRN
  Administered 2018-06-09: 8 mL via INTRAVENOUS

## 2018-06-09 MED ORDER — ENOXAPARIN SODIUM 40 MG/0.4ML ~~LOC~~ SOLN: 40.0000 mg | SUBCUTANEOUS | Status: DC

## 2018-06-09 MED ORDER — POTASSIUM CHLORIDE CRYS ER 20 MEQ PO TBCR
40.0000 meq | EXTENDED_RELEASE_TABLET | Freq: Two times a day (BID) | ORAL | Status: AC
  Administered 2018-06-09 (×2): 40 meq via ORAL
  Filled 2018-06-09 (×2): qty 2

## 2018-06-09 MED ORDER — SODIUM CHLORIDE 0.9 % IV SOLN: INTRAVENOUS | Status: DC

## 2018-06-09 NOTE — H&P (View-Only) (Signed)
Subjective: Patient was seen and examined at bedside. Denies abdominal pain, fever, chills or rigors.  Objective: Vital signs in last 24 hours: Temp:  [97.9 F (36.6 C)-98.5 F (36.9 C)] 98.1 F (36.7 C) (11/04 0848) Pulse Rate:  [64-69] 64 (11/04 0848) Resp:  [14-20] 20 (11/04 0848) BP: (118-133)/(74-83) 120/83 (11/04 0848) SpO2:  [94 %-96 %] 96 % (11/04 0848) Weight:  [88.7 kg] 88.7 kg (11/04 0540) Weight change: 0.4 kg Last BM Date: 06/08/18  ER:[id number] icterus GENERAL:not in distress ABDOMEN:soft, nondistended, nontender, normoactive bowel sounds EXTREMITIES:no deformity  Lab Results: Results for orders placed or performed during the hospital encounter of 06/07/18 (from the past 48 hour(s))  Lipase, blood     Status: None   Collection Time: 06/07/18  4:04 PM  Result Value Ref Range   Lipase 41 11 - 51 U/L    Comment: Performed at [facility], [place] [address] [place], [place] [address]  Comprehensive metabolic panel     Status: Abnormal   Collection Time: 06/07/18  4:04 PM  Result Value Ref Range   Sodium 138 135 - 145 mmol/L   Potassium 3.9 3.5 - 5.1 mmol/L   Chloride 108 98 - 111 mmol/L   CO2 22 22 - 32 mmol/L   Glucose, Bld 115 (H) 70 - 99 mg/dL   BUN 9 6 - 20 mg/dL   Creatinine, Ser 0.77 0.61 - 1.24 mg/dL   Calcium 9.3 8.9 - 10.3 mg/dL   Total Protein 7.2 6.5 - 8.1 g/dL   Albumin 3.7 3.5 - 5.0 g/dL   AST 232 (H) 15 - 41 U/L   ALT 541 (H) 0 - 44 U/L   Alkaline Phosphatase 306 (H) 38 - 126 U/L   Total Bilirubin 10.7 (H) 0.3 - 1.2 mg/dL   GFR calc non Af Amer >60 >60 mL/min   GFR calc Af Amer >60 >60 mL/min    Comment: (NOTE) The eGFR has been calculated using the CKD EPI equation. This calculation has not been validated in all clinical situations. eGFR's persistently <60 mL/min signify possible Chronic Kidney Disease.    Anion gap 8 5 - 15    Comment: Performed at [facility] [address], [place] [address]  CBC     Status: None    Collection Time: 06/07/18  4:04 PM  Result Value Ref Range   WBC 5.0 4.0 - 10.5 K/uL   RBC 4.86 4.22 - 5.81 MIL/uL   Hemoglobin 14.6 13.0 - 17.0 g/dL   HCT 44.5 39.0 - 52.0 %   MCV 91.6 80.0 - 100.0 fL   MCH 30.0 26.0 - 34.0 pg   MCHC 32.8 30.0 - 36.0 g/dL   RDW 14.2 11.5 - 15.5 %   Platelets 174 150 - 400 K/uL   nRBC 0.0 0.0 - 0.2 %    Comment: Performed at [facility], [place] [address], [place], [place] [address]  Urinalysis, Routine w reflex microscopic     Status: Abnormal   Collection Time: 06/07/18  4:04 PM  Result Value Ref Range   Color, Urine AMBER (A) YELLOW    Comment: BIOCHEMICALS MAY BE AFFECTED BY COLOR   APPearance CLEAR CLEAR   Specific Gravity, Urine 1.004 (L) 1.005 - 1.030   pH 5.0 5.0 - 8.0   Glucose, UA NEGATIVE NEGATIVE mg/dL   Hgb urine dipstick NEGATIVE NEGATIVE   Bilirubin Urine NEGATIVE NEGATIVE   Ketones, ur NEGATIVE NEGATIVE mg/dL   Protein, ur  NEGATIVE NEGATIVE mg/dL   Nitrite NEGATIVE NEGATIVE   Leukocytes, UA NEGATIVE NEGATIVE   RBC / HPF 0-5 0 - 5 RBC/hpf   WBC, UA 0-5 0 - 5 WBC/hpf   Bacteria, UA NONE SEEN NONE SEEN   Mucus PRESENT     Comment: Performed at [facility] [address] [place], [place] [address]  Bilirubin, fractionated(tot/dir/indir)     Status: Abnormal   Collection Time: 06/07/18  7:15 PM  Result Value Ref Range   Total Bilirubin 11.2 (H) 0.3 - 1.2 mg/dL   Bilirubin, Direct 6.9 (H) 0.0 - 0.2 mg/dL   Indirect Bilirubin 4.3 (H) 0.3 - 0.9 mg/dL    Comment: Performed at [facility] [address], [place], [place] [address]  Gamma GT     Status: Abnormal   Collection Time: 06/07/18  7:15 PM  Result Value Ref Range   GGT 1,038 (H) 7 - 50 U/L    Comment: Performed at [facility], [place] [address] [place], [place] [address]  HIV antibody     Status: None   Collection Time: 06/07/18  7:15 PM  Result Value Ref Range   HIV Screen 4th Generation wRfx Non Reactive Non Reactive    Comment:  (NOTE) Performed At: [facility] [place], [place] [id number] Rush Farmer MD SP:[id number]   Protime-INR     Status: None   Collection Time: 06/07/18  7:15 PM  Result Value Ref Range   Prothrombin Time 12.9 11.4 - 15.2 seconds   INR 0.98     Comment: Performed at [facility], [place] [address] [place], [place] [address]  CBC     Status: Abnormal   Collection Time: 06/08/18  5:50 AM  Result Value Ref Range   WBC 3.5 (L) 4.0 - 10.5 K/uL   RBC 4.59 4.22 - 5.81 MIL/uL   Hemoglobin 13.5 13.0 - 17.0 g/dL   HCT 41.6 39.0 - 52.0 %   MCV 90.6 80.0 - 100.0 fL   MCH 29.4 26.0 - 34.0 pg   MCHC 32.5 30.0 - 36.0 g/dL   RDW 14.2 11.5 - 15.5 %   Platelets 161 150 - 400 K/uL   nRBC 0.0 0.0 - 0.2 %    Comment: Performed at [facility], [place] [address] [place], [place] [address]  Comprehensive metabolic panel     Status: Abnormal   Collection Time: 06/08/18  5:50 AM  Result Value Ref Range   Sodium 136 135 - 145 mmol/L   Potassium 3.7 3.5 - 5.1 mmol/L   Chloride 107 98 - 111 mmol/L   CO2 21 (L) 22 - 32 mmol/L   Glucose, Bld 182 (H) 70 - 99 mg/dL   BUN 7 6 - 20 mg/dL   Creatinine, Ser 0.76 0.61 - 1.24 mg/dL   Calcium 8.8 (L) 8.9 - 10.3 mg/dL   Total Protein 6.3 (L) 6.5 - 8.1 g/dL   Albumin 3.2 (L) 3.5 - 5.0 g/dL   AST 277 (H) 15 - 41 U/L   ALT 569 (H) 0 - 44 U/L   Alkaline Phosphatase 274 (H) 38 - 126 U/L   Total Bilirubin 10.0 (H) 0.3 - 1.2 mg/dL   GFR calc non Af Amer >60 >60 mL/min   GFR calc Af Amer >60 >60 mL/min    Comment: (NOTE) The eGFR has been calculated using the CKD EPI equation. This calculation has not been validated in all clinical situations. eGFR's  persistently <60 mL/min signify possible Chronic Kidney Disease.    Anion gap 8 5 - 15    Comment: Performed at [facility] [address], [place], [place] [address]  Comprehensive metabolic panel     Status: Abnormal   Collection Time: 06/09/18  6:05 AM  Result Value Ref  Range   Sodium 137 135 - 145 mmol/L   Potassium 3.3 (L) 3.5 - 5.1 mmol/L   Chloride 106 98 - 111 mmol/L   CO2 25 22 - 32 mmol/L   Glucose, Bld 150 (H) 70 - 99 mg/dL   BUN <5 (L) 6 - 20 mg/dL   Creatinine, Ser 0.71 0.61 - 1.24 mg/dL   Calcium 8.6 (L) 8.9 - 10.3 mg/dL   Total Protein 5.7 (L) 6.5 - 8.1 g/dL   Albumin 2.9 (L) 3.5 - 5.0 g/dL   AST 222 (H) 15 - 41 U/L   ALT 584 (H) 0 - 44 U/L   Alkaline Phosphatase 242 (H) 38 - 126 U/L   Total Bilirubin 6.3 (H) 0.3 - 1.2 mg/dL   GFR calc non Af Amer >60 >60 mL/min   GFR calc Af Amer >60 >60 mL/min    Comment: (NOTE) The eGFR has been calculated using the CKD EPI equation. This calculation has not been validated in all clinical situations. eGFR's persistently <60 mL/min signify possible Chronic Kidney Disease.    Anion gap 6 5 - 15    Comment: Performed at [facility] [address] [place], [place] [address]    Studies/Results: Ct Abdomen Pelvis W Contrast  Result Date: 06/07/2018 CLINICAL DATA:  Oranged colored urine.  Jaundice EXAM: CT ABDOMEN AND PELVIS WITH CONTRAST TECHNIQUE: Multidetector CT imaging of the abdomen and pelvis was performed using the standard protocol following bolus administration of intravenous contrast. CONTRAST:  142m OMNIPAQUE IOHEXOL 300 MG/ML  SOLN COMPARISON:  None FINDINGS: Lower chest: No acute abnormality. Hepatobiliary: Small intermediate attenuating structure within segment 4a measures 8 mm, image 15/3. Indeterminate. There is mild intrahepatic bile duct dilatation. Moderate distension of the gallbladder. The common bile duct is increased in caliber measuring 1.6 cm. The dilated CBD terminates abruptly at the level of the head of pancreas, image 45/6. Pancreas: There is a mass centered around the neck of pancreas which measures 3.5 by 2.3 by 2.1 cm. Atrophy of the body and tail of pancreas with increase caliber of the main duct is noted. Encasement of the portal venous confluence is suspected. The  main portal vein and the splenic vein remain patent. The celiac trunk and SMA appear uninvolved. Spleen: Normal in size without focal abnormality. Adrenals/Urinary Tract: The adrenal glands appear normal. Small hypodensity within the posterior cortex of the right kidney measures 6 mm, image 45/3. Too small to characterize. No hydronephrosis. Urinary bladder appears normal. Stomach/Bowel: The stomach and the small bowel loops are unremarkable. The appendix is visualized and appears normal. Unremarkable appearance of the colon. Vascular/Lymphatic: Aortic atherosclerosis without aneurysm. No adenopathy. Reproductive: Prostate is unremarkable. Other: No ascites.  No peritoneal nodule or mass Musculoskeletal: No acute or significant osseous findings. IMPRESSION: 1. There is a low attenuation mass centered around the neck of pancreas which is concerning for neoplasm. Pancreatic adenocarcinoma favored. This results in common bile duct obstruction with mild intrahepatic biliary ductal dilatation. There also is involvement of the portal venous confluence. Further evaluation with nonemergent contrast enhanced MRI of the pancreas is recommended. 2. Small indeterminate low-attenuation structure is noted within segment 4 a  of the liver. This could be better addressed at MRI. Electronically Signed   By: Kerby Moors M.D.   On: 06/07/2018 23:37   Mr Abdomen Mrcp Moise Boring Contast  Addendum Date: 06/09/2018   ADDENDUM REPORT: 06/09/2018 09:47 ADDENDUM: Request made by Dr. Ronald Lobo GI service to evaluate for vascular involvement by the pancreatic mass. The pancreatic mass involves approximately 40 percent of the main portal vein circumference at the portal splenic venous confluence, with associated mild narrowing of the portal splenic venous confluence. The SMV, splenic vein and main, right and left portal veins remain patent. The celiac trunk and SMA are not involved by the pancreatic mass. These addended results were called  by telephone at the time of interpretation on 06/09/2018 at 9:45 am to Dr. Cristina Gong, who verbally acknowledged these results. Electronically Signed   By: Ilona Sorrel M.D.   On: 06/09/2018 09:47   Result Date: 06/09/2018 CLINICAL DATA:  Jaundice and weight loss. Pancreatic mass seen on CT. EXAM: MRI ABDOMEN WITHOUT AND WITH CONTRAST (INCLUDING MRCP) TECHNIQUE: Multiplanar multisequence MR imaging of the abdomen was performed both before and after the administration of intravenous contrast. Heavily T2-weighted images of the biliary and pancreatic ducts were obtained, and three-dimensional MRCP images were rendered by post processing. CONTRAST:  8 mL Gadavist COMPARISON:  CT abdomen and pelvis 06/07/2018 FINDINGS: Motion artifact limits examination. Lower chest: Atelectasis in the lung bases. Hepatobiliary: Intra and extrahepatic bile duct dilatation with abrupt change in caliber of the mid common bile duct suggesting an obstructing mass or stricture. Gallbladder is distended without wall thickening or edema. No stones identified. 9 mm focal lesion in segment 4 appears to enhance in the periphery. This could represent a small hemangioma. Metastasis less likely. Pancreas: Hypoenhancing mass at the junction of the head and neck of the pancreas measuring 2.3 cm diameter. Pancreatic ductal dilatation. Likely adenocarcinoma. No significant infiltration or vascular involvement. Spleen:  Within normal limits in size and appearance. Adrenals/Urinary Tract: No masses identified. No evidence of hydronephrosis. Stomach/Bowel: Visualized portions within the abdomen are unremarkable. Vascular/Lymphatic: No pathologically enlarged lymph nodes identified. No abdominal aortic aneurysm demonstrated. Other:  None. Musculoskeletal: No focal bone lesions identified. IMPRESSION: 2.3 cm diameter hypoenhancing mass at the junction of the head and neck of pancreas with pancreatic ductal dilatation, likely adenocarcinoma. Intra and  extrahepatic bile duct dilatation with abrupt change in caliber at the mid common bile duct. This could indicate an obstructing mass or stricture. Electronically Signed: By: Lucienne Capers M.D. On: 06/09/2018 05:15    Medications: I have reviewed the patient's current medications.  Assessment: Pancreatic neck mass suspicious for neoplasm with CBD obstruction and intrahepatic dilatation. 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 Paulita Fujita tomorrow at 1 PM, followed by ERCP with me with possible metal stent placement. The risks(infection, bleeding, perforation requiring surgery, pancreatitis and even death) and benefits of the procedure were discussed with the patient and his wife at bedside in details. They understand and verbalized consent. Patient will be started on regular diet today and kept nothing by mouth postmidnight. Will hold morning dose of Lovenox.   Ronnette Juniper 06/09/2018, 9:57 AM   Pager [phone] If no answer or after 5 PM call [phone]

## 2018-06-09 NOTE — Progress Notes (Signed)
Radiographic findings and options for diagnostic work-up were reviewed with the patient and his family earlier this morning.  Dr. Encarnacion Slates note and plans are noted.  In the meantime, I have requested surgical consultation.  The initial MRI report indicated that the liver lesion is probably nonmetastatic, but there was no comment on vascular involvement.  I spoke with the radiologist about this and an addendum has been made, which indicates that there is radiographic evidence for involvement of the portal vein at the confluence with the splenic vein, although it is not frankly obstructing.  I have requested surgical consultation to address whether they feel this patient is a surgical candidate which might help Dr. Therisa Doyne plan her approach tomorrow.  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  Cleotis Nipper, M.D. Pager [phone] If no answer or after 5 PM call [phone]

## 2018-06-09 NOTE — Progress Notes (Signed)
Subjective: Patient was seen and examined at bedside. Denies abdominal pain, fever, chills or rigors.  Objective: Vital signs in last 24 hours: Temp:  [97.9 F (36.6 C)-98.5 F (36.9 C)] 98.1 F (36.7 C) (11/04 0848) Pulse Rate:  [64-69] 64 (11/04 0848) Resp:  [14-20] 20 (11/04 0848) BP: (118-133)/(74-83) 120/83 (11/04 0848) SpO2:  [94 %-96 %] 96 % (11/04 0848) Weight:  [88.7 kg] 88.7 kg (11/04 0540) Weight change: 0.4 kg Last BM Date: 06/08/18  TK:[id number] icterus GENERAL:not in distress ABDOMEN:soft, nondistended, nontender, normoactive bowel sounds EXTREMITIES:no deformity  Lab Results: Results for orders placed or performed during the hospital encounter of 06/07/18 (from the past 48 hour(s))  Lipase, blood     Status: None   Collection Time: 06/07/18  4:04 PM  Result Value Ref Range   Lipase 41 11 - 51 U/L    Comment: Performed at [facility], [place] [address], [place], [place] [address]  Comprehensive metabolic panel     Status: Abnormal   Collection Time: 06/07/18  4:04 PM  Result Value Ref Range   Sodium 138 135 - 145 mmol/L   Potassium 3.9 3.5 - 5.1 mmol/L   Chloride 108 98 - 111 mmol/L   CO2 22 22 - 32 mmol/L   Glucose, Bld 115 (H) 70 - 99 mg/dL   BUN 9 6 - 20 mg/dL   Creatinine, Ser 0.77 0.61 - 1.24 mg/dL   Calcium 9.3 8.9 - 10.3 mg/dL   Total Protein 7.2 6.5 - 8.1 g/dL   Albumin 3.7 3.5 - 5.0 g/dL   AST 232 (H) 15 - 41 U/L   ALT 541 (H) 0 - 44 U/L   Alkaline Phosphatase 306 (H) 38 - 126 U/L   Total Bilirubin 10.7 (H) 0.3 - 1.2 mg/dL   GFR calc non Af Amer >60 >60 mL/min   GFR calc Af Amer >60 >60 mL/min    Comment: (NOTE) The eGFR has been calculated using the CKD EPI equation. This calculation has not been validated in all clinical situations. eGFR's persistently <60 mL/min signify possible Chronic Kidney Disease.    Anion gap 8 5 - 15    Comment: Performed at [facility] [address], [place] [address]  CBC     Status: None    Collection Time: 06/07/18  4:04 PM  Result Value Ref Range   WBC 5.0 4.0 - 10.5 K/uL   RBC 4.86 4.22 - 5.81 MIL/uL   Hemoglobin 14.6 13.0 - 17.0 g/dL   HCT 44.5 39.0 - 52.0 %   MCV 91.6 80.0 - 100.0 fL   MCH 30.0 26.0 - 34.0 pg   MCHC 32.8 30.0 - 36.0 g/dL   RDW 14.2 11.5 - 15.5 %   Platelets 174 150 - 400 K/uL   nRBC 0.0 0.0 - 0.2 %    Comment: Performed at [facility], [place] [address], [place], [place] [address]  Urinalysis, Routine w reflex microscopic     Status: Abnormal   Collection Time: 06/07/18  4:04 PM  Result Value Ref Range   Color, Urine AMBER (A) YELLOW    Comment: BIOCHEMICALS MAY BE AFFECTED BY COLOR   APPearance CLEAR CLEAR   Specific Gravity, Urine 1.004 (L) 1.005 - 1.030   pH 5.0 5.0 - 8.0   Glucose, UA NEGATIVE NEGATIVE mg/dL   Hgb urine dipstick NEGATIVE NEGATIVE   Bilirubin Urine NEGATIVE NEGATIVE   Ketones, ur NEGATIVE NEGATIVE mg/dL   Protein, ur  NEGATIVE NEGATIVE mg/dL   Nitrite NEGATIVE NEGATIVE   Leukocytes, UA NEGATIVE NEGATIVE   RBC / HPF 0-5 0 - 5 RBC/hpf   WBC, UA 0-5 0 - 5 WBC/hpf   Bacteria, UA NONE SEEN NONE SEEN   Mucus PRESENT     Comment: Performed at [facility] [address], [place], [place] [address]  Bilirubin, fractionated(tot/dir/indir)     Status: Abnormal   Collection Time: 06/07/18  7:15 PM  Result Value Ref Range   Total Bilirubin 11.2 (H) 0.3 - 1.2 mg/dL   Bilirubin, Direct 6.9 (H) 0.0 - 0.2 mg/dL   Indirect Bilirubin 4.3 (H) 0.3 - 0.9 mg/dL    Comment: Performed at [facility] [address] [place], [place] [address]  Gamma GT     Status: Abnormal   Collection Time: 06/07/18  7:15 PM  Result Value Ref Range   GGT 1,038 (H) 7 - 50 U/L    Comment: Performed at [facility], [place] [address], [place], [place] [address]  HIV antibody     Status: None   Collection Time: 06/07/18  7:15 PM  Result Value Ref Range   HIV Screen 4th Generation wRfx Non Reactive Non Reactive    Comment:  (NOTE) Performed At: [facility] [place], [place] [id number] Rush Farmer MD WY:[id number]   Protime-INR     Status: None   Collection Time: 06/07/18  7:15 PM  Result Value Ref Range   Prothrombin Time 12.9 11.4 - 15.2 seconds   INR 0.98     Comment: Performed at [facility], [place] [address], [place], [place] [address]  CBC     Status: Abnormal   Collection Time: 06/08/18  5:50 AM  Result Value Ref Range   WBC 3.5 (L) 4.0 - 10.5 K/uL   RBC 4.59 4.22 - 5.81 MIL/uL   Hemoglobin 13.5 13.0 - 17.0 g/dL   HCT 41.6 39.0 - 52.0 %   MCV 90.6 80.0 - 100.0 fL   MCH 29.4 26.0 - 34.0 pg   MCHC 32.5 30.0 - 36.0 g/dL   RDW 14.2 11.5 - 15.5 %   Platelets 161 150 - 400 K/uL   nRBC 0.0 0.0 - 0.2 %    Comment: Performed at [facility], [place] [address] [place], [place] [address]  Comprehensive metabolic panel     Status: Abnormal   Collection Time: 06/08/18  5:50 AM  Result Value Ref Range   Sodium 136 135 - 145 mmol/L   Potassium 3.7 3.5 - 5.1 mmol/L   Chloride 107 98 - 111 mmol/L   CO2 21 (L) 22 - 32 mmol/L   Glucose, Bld 182 (H) 70 - 99 mg/dL   BUN 7 6 - 20 mg/dL   Creatinine, Ser 0.76 0.61 - 1.24 mg/dL   Calcium 8.8 (L) 8.9 - 10.3 mg/dL   Total Protein 6.3 (L) 6.5 - 8.1 g/dL   Albumin 3.2 (L) 3.5 - 5.0 g/dL   AST 277 (H) 15 - 41 U/L   ALT 569 (H) 0 - 44 U/L   Alkaline Phosphatase 274 (H) 38 - 126 U/L   Total Bilirubin 10.0 (H) 0.3 - 1.2 mg/dL   GFR calc non Af Amer >60 >60 mL/min   GFR calc Af Amer >60 >60 mL/min    Comment: (NOTE) The eGFR has been calculated using the CKD EPI equation. This calculation has not been validated in all clinical situations. eGFR's  persistently <60 mL/min signify possible Chronic Kidney Disease.    Anion gap 8 5 - 15    Comment: Performed at [facility] [address], [place], [place] [address]  Comprehensive metabolic panel     Status: Abnormal   Collection Time: 06/09/18  6:05 AM  Result Value Ref  Range   Sodium 137 135 - 145 mmol/L   Potassium 3.3 (L) 3.5 - 5.1 mmol/L   Chloride 106 98 - 111 mmol/L   CO2 25 22 - 32 mmol/L   Glucose, Bld 150 (H) 70 - 99 mg/dL   BUN <5 (L) 6 - 20 mg/dL   Creatinine, Ser 0.71 0.61 - 1.24 mg/dL   Calcium 8.6 (L) 8.9 - 10.3 mg/dL   Total Protein 5.7 (L) 6.5 - 8.1 g/dL   Albumin 2.9 (L) 3.5 - 5.0 g/dL   AST 222 (H) 15 - 41 U/L   ALT 584 (H) 0 - 44 U/L   Alkaline Phosphatase 242 (H) 38 - 126 U/L   Total Bilirubin 6.3 (H) 0.3 - 1.2 mg/dL   GFR calc non Af Amer >60 >60 mL/min   GFR calc Af Amer >60 >60 mL/min    Comment: (NOTE) The eGFR has been calculated using the CKD EPI equation. This calculation has not been validated in all clinical situations. eGFR's persistently <60 mL/min signify possible Chronic Kidney Disease.    Anion gap 6 5 - 15    Comment: Performed at [facility] [address], [place], [place] [address]    Studies/Results: Ct Abdomen Pelvis W Contrast  Result Date: 06/07/2018 CLINICAL DATA:  Oranged colored urine.  Jaundice EXAM: CT ABDOMEN AND PELVIS WITH CONTRAST TECHNIQUE: Multidetector CT imaging of the abdomen and pelvis was performed using the standard protocol following bolus administration of intravenous contrast. CONTRAST:  118m OMNIPAQUE IOHEXOL 300 MG/ML  SOLN COMPARISON:  None FINDINGS: Lower chest: No acute abnormality. Hepatobiliary: Small intermediate attenuating structure within segment 4a measures 8 mm, image 15/3. Indeterminate. There is mild intrahepatic bile duct dilatation. Moderate distension of the gallbladder. The common bile duct is increased in caliber measuring 1.6 cm. The dilated CBD terminates abruptly at the level of the head of pancreas, image 45/6. Pancreas: There is a mass centered around the neck of pancreas which measures 3.5 by 2.3 by 2.1 cm. Atrophy of the body and tail of pancreas with increase caliber of the main duct is noted. Encasement of the portal venous confluence is suspected. The  main portal vein and the splenic vein remain patent. The celiac trunk and SMA appear uninvolved. Spleen: Normal in size without focal abnormality. Adrenals/Urinary Tract: The adrenal glands appear normal. Small hypodensity within the posterior cortex of the right kidney measures 6 mm, image 45/3. Too small to characterize. No hydronephrosis. Urinary bladder appears normal. Stomach/Bowel: The stomach and the small bowel loops are unremarkable. The appendix is visualized and appears normal. Unremarkable appearance of the colon. Vascular/Lymphatic: Aortic atherosclerosis without aneurysm. No adenopathy. Reproductive: Prostate is unremarkable. Other: No ascites.  No peritoneal nodule or mass Musculoskeletal: No acute or significant osseous findings. IMPRESSION: 1. There is a low attenuation mass centered around the neck of pancreas which is concerning for neoplasm. Pancreatic adenocarcinoma favored. This results in common bile duct obstruction with mild intrahepatic biliary ductal dilatation. There also is involvement of the portal venous confluence. Further evaluation with nonemergent contrast enhanced MRI of the pancreas is recommended. 2. Small indeterminate low-attenuation structure is noted within segment 4 a  of the liver. This could be better addressed at MRI. Electronically Signed   By: Kerby Moors M.D.   On: 06/07/2018 23:37   Mr Abdomen Mrcp Moise Boring Contast  Addendum Date: 06/09/2018   ADDENDUM REPORT: 06/09/2018 09:47 ADDENDUM: Request made by Dr. Ronald Lobo GI service to evaluate for vascular involvement by the pancreatic mass. The pancreatic mass involves approximately 40 percent of the main portal vein circumference at the portal splenic venous confluence, with associated mild narrowing of the portal splenic venous confluence. The SMV, splenic vein and main, right and left portal veins remain patent. The celiac trunk and SMA are not involved by the pancreatic mass. These addended results were called  by telephone at the time of interpretation on 06/09/2018 at 9:45 am to Dr. Cristina Gong, who verbally acknowledged these results. Electronically Signed   By: Ilona Sorrel M.D.   On: 06/09/2018 09:47   Result Date: 06/09/2018 CLINICAL DATA:  Jaundice and weight loss. Pancreatic mass seen on CT. EXAM: MRI ABDOMEN WITHOUT AND WITH CONTRAST (INCLUDING MRCP) TECHNIQUE: Multiplanar multisequence MR imaging of the abdomen was performed both before and after the administration of intravenous contrast. Heavily T2-weighted images of the biliary and pancreatic ducts were obtained, and three-dimensional MRCP images were rendered by post processing. CONTRAST:  8 mL Gadavist COMPARISON:  CT abdomen and pelvis 06/07/2018 FINDINGS: Motion artifact limits examination. Lower chest: Atelectasis in the lung bases. Hepatobiliary: Intra and extrahepatic bile duct dilatation with abrupt change in caliber of the mid common bile duct suggesting an obstructing mass or stricture. Gallbladder is distended without wall thickening or edema. No stones identified. 9 mm focal lesion in segment 4 appears to enhance in the periphery. This could represent a small hemangioma. Metastasis less likely. Pancreas: Hypoenhancing mass at the junction of the head and neck of the pancreas measuring 2.3 cm diameter. Pancreatic ductal dilatation. Likely adenocarcinoma. No significant infiltration or vascular involvement. Spleen:  Within normal limits in size and appearance. Adrenals/Urinary Tract: No masses identified. No evidence of hydronephrosis. Stomach/Bowel: Visualized portions within the abdomen are unremarkable. Vascular/Lymphatic: No pathologically enlarged lymph nodes identified. No abdominal aortic aneurysm demonstrated. Other:  None. Musculoskeletal: No focal bone lesions identified. IMPRESSION: 2.3 cm diameter hypoenhancing mass at the junction of the head and neck of pancreas with pancreatic ductal dilatation, likely adenocarcinoma. Intra and  extrahepatic bile duct dilatation with abrupt change in caliber at the mid common bile duct. This could indicate an obstructing mass or stricture. Electronically Signed: By: Lucienne Capers M.D. On: 06/09/2018 05:15    Medications: I have reviewed the patient's current medications.  Assessment: Pancreatic neck mass suspicious for neoplasm with CBD obstruction and intrahepatic dilatation. 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 Paulita Fujita tomorrow at 1 PM, followed by ERCP with me with possible metal stent placement. The risks(infection, bleeding, perforation requiring surgery, pancreatitis and even death) and benefits of the procedure were discussed with the patient and his wife at bedside in details. They understand and verbalized consent. Patient will be started on regular diet today and kept nothing by mouth postmidnight. Will hold morning dose of Lovenox.   Ernest Wu 06/09/2018, 9:57 AM   Pager [phone] If no answer or after 5 PM call [phone]

## 2018-06-09 NOTE — Consult Note (Signed)
   Ernest Wu 06/09/1961  [id number].    Requesting MD: Dr. Robert Buccini Chief Complaint/Reason for Consult: pancreatic mass  HPI:  This is a 57 yo otherwise healthy white male who smokes about a pack of cigarettes a day and drinks about 15 beers a week who has had some abdominal bloating dating back to July; however, intermittent.  About 2 weeks ago he has some upper abdominal pain along with this bloating again.  That seemed to improve after about a week.  He then noticed for the last week that he has had clay colored diarrhea, dark, amber urine, and yellowing of his skin and eyes.  He has been eating well with no nausea or vomiting.  He went to an urgent care on Saturday and his TB was found to be elevated.  He was sent to the ED where he was admitted after a CT scan revealed a mass around the neck of the pancreas concerning for a neoplasm with CBD obstruction.  His TB was 10 on admission.  He then underwent an MRI which confirmed CBD obstruction secondary to pancreatic head and neck mass measuring about 2.3cm.  He was found to have a lesion in segment 4 of the liver, but this was likely a hemangioma and not a met.  A portion of his portal vein was involved with this mass as well.  GI plans to proceed with EUS, Bx, and ERCP with stent placement tomorrow.  We have been asked to see the patient for surgical recommendations prior to these procedures.  Of note, his grandfather on his father's side died of pancreatic cancer.  ROS: ROS: Please see HPI, otherwise all other systems have been reviewed and are negative.  He doesn't weight, but does think he has lost a little bit of weight in the last week or so since having diarrhea.  History reviewed. No pertinent family history.  History reviewed. No pertinent past medical history.  Past Surgical History:  Procedure Laterality Date  . VASECTOMY      Social History:  reports that he has been smoking. He has been smoking about 1.00 pack per  day. He has never used smokeless tobacco. He reports that he drinks about 15.0 standard drinks of alcohol per week. He reports that he does not use drugs.  Allergies:  Allergies  Allergen Reactions  . Iohexol Anaphylaxis, Itching and Rash  . Contrast Media [Iodinated Diagnostic Agents]     Burning sensation    Medications Prior to Admission  Medication Sig Dispense Refill  . ibuprofen (ADVIL,MOTRIN) 200 MG tablet Take 800 mg by mouth every 6 (six) hours as needed (for pain or headaches).    . omeprazole (PRILOSEC OTC) 20 MG tablet Take 20 mg by mouth daily as needed (for heartburn).       Physical Exam: Blood pressure 120/83, pulse 64, temperature 98.1 F (36.7 C), temperature source Oral, resp. rate 20, height 6' (1.829 m), weight 88.7 kg, SpO2 96 %. General: pleasant, WD, WN white male who is laying in bed in NAD HEENT: head is normocephalic, atraumatic.  Sclera are icteric.  PERRL.  Ears and nose without any masses or lesions.  Mouth is pink and moist Heart: regular, rate, and rhythm.  Normal s1,s2. No obvious murmurs, gallops, or rubs noted.  Palpable radial and pedal pulses bilaterally Lungs: CTAB, no wheezes, rhonchi, or rales noted.  Respiratory effort nonlabored Abd: soft, NT, ND, +BS, no masses, hernias, or organomegaly MS: all 4 extremities are   symmetrical with no cyanosis, clubbing, or edema. Skin: warm and dry with no masses, lesions, or rashes.  He is tan so jaundice is hard to truly see well. Psych: A&Ox3 with an appropriate affect.   Results for orders placed or performed during the hospital encounter of 06/07/18 (from the past 48 hour(s))  Lipase, blood     Status: None   Collection Time: 06/07/18  4:04 PM  Result Value Ref Range   Lipase 41 11 - 51 U/L    Comment: Performed at [facility], [place] [address], [place], [place] [address]  Comprehensive metabolic panel     Status: Abnormal   Collection Time: 06/07/18  4:04 PM  Result Value Ref Range   Sodium  138 135 - 145 mmol/L   Potassium 3.9 3.5 - 5.1 mmol/L   Chloride 108 98 - 111 mmol/L   CO2 22 22 - 32 mmol/L   Glucose, Bld 115 (H) 70 - 99 mg/dL   BUN 9 6 - 20 mg/dL   Creatinine, Ser 0.77 0.61 - 1.24 mg/dL   Calcium 9.3 8.9 - 10.3 mg/dL   Total Protein 7.2 6.5 - 8.1 g/dL   Albumin 3.7 3.5 - 5.0 g/dL   AST 232 (H) 15 - 41 U/L   ALT 541 (H) 0 - 44 U/L   Alkaline Phosphatase 306 (H) 38 - 126 U/L   Total Bilirubin 10.7 (H) 0.3 - 1.2 mg/dL   GFR calc non Af Amer >60 >60 mL/min   GFR calc Af Amer >60 >60 mL/min    Comment: (NOTE) The eGFR has been calculated using the CKD EPI equation. This calculation has not been validated in all clinical situations. eGFR's persistently <60 mL/min signify possible Chronic Kidney Disease.    Anion gap 8 5 - 15    Comment: Performed at [facility] [address] [place] [address]  CBC     Status: None   Collection Time: 06/07/18  4:04 PM  Result Value Ref Range   WBC 5.0 4.0 - 10.5 K/uL   RBC 4.86 4.22 - 5.81 MIL/uL   Hemoglobin 14.6 13.0 - 17.0 g/dL   HCT 44.5 39.0 - 52.0 %   MCV 91.6 80.0 - 100.0 fL   MCH 30.0 26.0 - 34.0 pg   MCHC 32.8 30.0 - 36.0 g/dL   RDW 14.2 11.5 - 15.5 %   Platelets 174 150 - 400 K/uL   nRBC 0.0 0.0 - 0.2 %    Comment: Performed at [facility], [place] [address], [place], [place] [address]  Urinalysis, Routine w reflex microscopic     Status: Abnormal   Collection Time: 06/07/18  4:04 PM  Result Value Ref Range   Color, Urine AMBER (A) YELLOW    Comment: BIOCHEMICALS MAY BE AFFECTED BY COLOR   APPearance CLEAR CLEAR   Specific Gravity, Urine 1.004 (L) 1.005 - 1.030   pH 5.0 5.0 - 8.0   Glucose, UA NEGATIVE NEGATIVE mg/dL   Hgb urine dipstick NEGATIVE NEGATIVE   Bilirubin Urine NEGATIVE NEGATIVE   Ketones, ur NEGATIVE NEGATIVE mg/dL   Protein, ur NEGATIVE NEGATIVE mg/dL   Nitrite NEGATIVE NEGATIVE   Leukocytes, UA NEGATIVE NEGATIVE   RBC / HPF 0-5 0 - 5 RBC/hpf   WBC, UA 0-5 0 - 5  WBC/hpf   Bacteria, UA NONE SEEN NONE SEEN   Mucus PRESENT     Comment: Performed at [facility], [place] [address],  [place], [place] [address]  Bilirubin, fractionated(tot/dir/indir)     Status: Abnormal   Collection Time: 06/07/18  7:15 PM  Result Value Ref Range   Total Bilirubin 11.2 (H) 0.3 - 1.2 mg/dL   Bilirubin, Direct 6.9 (H) 0.0 - 0.2 mg/dL   Indirect Bilirubin 4.3 (H) 0.3 - 0.9 mg/dL    Comment: Performed at [facility], [address] [place], [place] [address]  Gamma GT     Status: Abnormal   Collection Time: 06/07/18  7:15 PM  Result Value Ref Range   GGT 1,038 (H) 7 - 50 U/L    Comment: Performed at [facility], [address] [place], [place] [address]  HIV antibody     Status: None   Collection Time: 06/07/18  7:15 PM  Result Value Ref Range   HIV Screen 4th Generation wRfx Non Reactive Non Reactive    Comment: (NOTE) Performed At: [facility] [address] [place], [place] [id number] Nagendra Sanjai MD Ph:[phone]   Protime-INR     Status: None   Collection Time: 06/07/18  7:15 PM  Result Value Ref Range   Prothrombin Time 12.9 11.4 - 15.2 seconds   INR 0.98     Comment: Performed at [facility], [address] [place], [place] [address]  CBC     Status: Abnormal   Collection Time: 06/08/18  5:50 AM  Result Value Ref Range   WBC 3.5 (L) 4.0 - 10.5 K/uL   RBC 4.59 4.22 - 5.81 MIL/uL   Hemoglobin 13.5 13.0 - 17.0 g/dL   HCT 41.6 39.0 - 52.0 %   MCV 90.6 80.0 - 100.0 fL   MCH 29.4 26.0 - 34.0 pg   MCHC 32.5 30.0 - 36.0 g/dL   RDW 14.2 11.5 - 15.5 %   Platelets 161 150 - 400 K/uL   nRBC 0.0 0.0 - 0.2 %    Comment: Performed at [facility], [address] [place], [place] [address]  Comprehensive metabolic panel     Status: Abnormal   Collection Time: 06/08/18  5:50 AM  Result Value Ref Range   Sodium 136 135 - 145 mmol/L   Potassium 3.7 3.5 - 5.1 mmol/L   Chloride 107 98 - 111 mmol/L   CO2 21 (L) 22 - 32  mmol/L   Glucose, Bld 182 (H) 70 - 99 mg/dL   BUN 7 6 - 20 mg/dL   Creatinine, Ser 0.76 0.61 - 1.24 mg/dL   Calcium 8.8 (L) 8.9 - 10.3 mg/dL   Total Protein 6.3 (L) 6.5 - 8.1 g/dL   Albumin 3.2 (L) 3.5 - 5.0 g/dL   AST 277 (H) 15 - 41 U/L   ALT 569 (H) 0 - 44 U/L   Alkaline Phosphatase 274 (H) 38 - 126 U/L   Total Bilirubin 10.0 (H) 0.3 - 1.2 mg/dL   GFR calc non Af Amer >60 >60 mL/min   GFR calc Af Amer >60 >60 mL/min    Comment: (NOTE) The eGFR has been calculated using the CKD EPI equation. This calculation has not been validated in all clinical situations. eGFR's persistently <60 mL/min signify possible Chronic Kidney Disease.    Anion gap 8 5 - 15    Comment: Performed at [facility], [address] [place], [place] [address]  Comprehensive metabolic panel     Status: Abnormal   Collection Time: 06/09/18  6:05 AM  Result Value Ref Range   Sodium   137 135 - 145 mmol/L   Potassium 3.3 (L) 3.5 - 5.1 mmol/L   Chloride 106 98 - 111 mmol/L   CO2 25 22 - 32 mmol/L   Glucose, Bld 150 (H) 70 - 99 mg/dL   BUN <5 (L) 6 - 20 mg/dL   Creatinine, Ser 0.71 0.61 - 1.24 mg/dL   Calcium 8.6 (L) 8.9 - 10.3 mg/dL   Total Protein 5.7 (L) 6.5 - 8.1 g/dL   Albumin 2.9 (L) 3.5 - 5.0 g/dL   AST 222 (H) 15 - 41 U/L   ALT 584 (H) 0 - 44 U/L   Alkaline Phosphatase 242 (H) 38 - 126 U/L   Total Bilirubin 6.3 (H) 0.3 - 1.2 mg/dL   GFR calc non Af Amer >60 >60 mL/min   GFR calc Af Amer >60 >60 mL/min    Comment: (NOTE) The eGFR has been calculated using the CKD EPI equation. This calculation has not been validated in all clinical situations. eGFR's persistently <60 mL/min signify possible Chronic Kidney Disease.    Anion gap 6 5 - 15    Comment: Performed at [facility], [address] [place], [place] [address]   Ct Abdomen Pelvis W Contrast  Result Date: 06/07/2018 CLINICAL DATA:  Oranged colored urine.  Jaundice EXAM: CT ABDOMEN AND PELVIS WITH CONTRAST TECHNIQUE:  Multidetector CT imaging of the abdomen and pelvis was performed using the standard protocol following bolus administration of intravenous contrast. CONTRAST:  100mL OMNIPAQUE IOHEXOL 300 MG/ML  SOLN COMPARISON:  None FINDINGS: Lower chest: No acute abnormality. Hepatobiliary: Small intermediate attenuating structure within segment 4a measures 8 mm, image 15/3. Indeterminate. There is mild intrahepatic bile duct dilatation. Moderate distension of the gallbladder. The common bile duct is increased in caliber measuring 1.6 cm. The dilated CBD terminates abruptly at the level of the head of pancreas, image 45/6. Pancreas: There is a mass centered around the neck of pancreas which measures 3.5 by 2.3 by 2.1 cm. Atrophy of the body and tail of pancreas with increase caliber of the main duct is noted. Encasement of the portal venous confluence is suspected. The main portal vein and the splenic vein remain patent. The celiac trunk and SMA appear uninvolved. Spleen: Normal in size without focal abnormality. Adrenals/Urinary Tract: The adrenal glands appear normal. Small hypodensity within the posterior cortex of the right kidney measures 6 mm, image 45/3. Too small to characterize. No hydronephrosis. Urinary bladder appears normal. Stomach/Bowel: The stomach and the small bowel loops are unremarkable. The appendix is visualized and appears normal. Unremarkable appearance of the colon. Vascular/Lymphatic: Aortic atherosclerosis without aneurysm. No adenopathy. Reproductive: Prostate is unremarkable. Other: No ascites.  No peritoneal nodule or mass Musculoskeletal: No acute or significant osseous findings. IMPRESSION: 1. There is a low attenuation mass centered around the neck of pancreas which is concerning for neoplasm. Pancreatic adenocarcinoma favored. This results in common bile duct obstruction with mild intrahepatic biliary ductal dilatation. There also is involvement of the portal venous confluence. Further evaluation  with nonemergent contrast enhanced MRI of the pancreas is recommended. 2. Small indeterminate low-attenuation structure is noted within segment 4 a of the liver. This could be better addressed at MRI. Electronically Signed   By: Taylor  Stroud M.D.   On: 06/07/2018 23:37   Mr Abdomen Mrcp W Wo Contast  Addendum Date: 06/09/2018   ADDENDUM REPORT: 06/09/2018 09:47 ADDENDUM: Request made by Dr. Robert Buccini GI service to evaluate for vascular involvement by the pancreatic mass. The pancreatic   mass involves approximately 40 percent of the main portal vein circumference at the portal splenic venous confluence, with associated mild narrowing of the portal splenic venous confluence. The SMV, splenic vein and main, right and left portal veins remain patent. The celiac trunk and SMA are not involved by the pancreatic mass. These addended results were called by telephone at the time of interpretation on 06/09/2018 at 9:45 am to Dr. BUCCINI, who verbally acknowledged these results. Electronically Signed   By: Jason A Poff M.D.   On: 06/09/2018 09:47   Result Date: 06/09/2018 CLINICAL DATA:  Jaundice and weight loss. Pancreatic mass seen on CT. EXAM: MRI ABDOMEN WITHOUT AND WITH CONTRAST (INCLUDING MRCP) TECHNIQUE: Multiplanar multisequence MR imaging of the abdomen was performed both before and after the administration of intravenous contrast. Heavily T2-weighted images of the biliary and pancreatic ducts were obtained, and three-dimensional MRCP images were rendered by post processing. CONTRAST:  8 mL Gadavist COMPARISON:  CT abdomen and pelvis 06/07/2018 FINDINGS: Motion artifact limits examination. Lower chest: Atelectasis in the lung bases. Hepatobiliary: Intra and extrahepatic bile duct dilatation with abrupt change in caliber of the mid common bile duct suggesting an obstructing mass or stricture. Gallbladder is distended without wall thickening or edema. No stones identified. 9 mm focal lesion in segment 4  appears to enhance in the periphery. This could represent a small hemangioma. Metastasis less likely. Pancreas: Hypoenhancing mass at the junction of the head and neck of the pancreas measuring 2.3 cm diameter. Pancreatic ductal dilatation. Likely adenocarcinoma. No significant infiltration or vascular involvement. Spleen:  Within normal limits in size and appearance. Adrenals/Urinary Tract: No masses identified. No evidence of hydronephrosis. Stomach/Bowel: Visualized portions within the abdomen are unremarkable. Vascular/Lymphatic: No pathologically enlarged lymph nodes identified. No abdominal aortic aneurysm demonstrated. Other:  None. Musculoskeletal: No focal bone lesions identified. IMPRESSION: 2.3 cm diameter hypoenhancing mass at the junction of the head and neck of pancreas with pancreatic ductal dilatation, likely adenocarcinoma. Intra and extrahepatic bile duct dilatation with abrupt change in caliber at the mid common bile duct. This could indicate an obstructing mass or stricture. Electronically Signed: By: William  Stevens M.D. 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 Byerly who has reviewed the patient's imaging.  She does feel that the possibility of resection is present despite some involvement of the portal vein.  She states that either a plastic or metal stent is fine and would not interfere with surgical resection if able to perform.  However, because of this invasion, she would recommend oncology to see him for neoadjuvant therapy prior to any type of resection, if his biopsies come back positive, which is highly suspected.  She will see him in the office about 1-2 weeks after discharge and after oncology sees the patient to discuss neoadjuvant treatment.  No plans for surgery during this hospitalization.  All of this has been discussed with the patient and his family (wife and son) who  are present.  All questions were answered.  Tobacco abuse - needs to stop smoking!  Kelly E Osborne, PA-C [facility] 06/09/2018, 2:12 PM Pager: [phone]  

## 2018-06-09 NOTE — Progress Notes (Addendum)
Family Medicine Teaching Service Daily Progress Note Intern Pager: [phone]  Patient name: Ernest Wu Medical record number: [id number] Date of birth: 05-20-1961 Age: 57 y.o. Gender: male  Primary Care Provider: System, Pcp Not In Consultants: GI Code Status: full  Pt Overview and Major Events to Date:  11/2 Admitted, pancreatic mass visualized on CT  Assessment and Plan:  Ernest Wu a 57 y.o.malepresenting with jaundice. History of tobacco use disorder.  Pancreatic mass Likely pancreatic adenocarcinoma visualized on CT abdomen/pelvis with contrast on admission.  Patient has had weight loss and jaundice, likely due to obstruction of common bile duct from mass.  Mixed direct and indirect hyperbilirubinemia also indicative of obstructive process. MRCP (11/4) shows mass (most likely adenocarcinoma) of Pancreatic head and neck with common bile duct obstruction.  -GI consult: recommend MRI and MRCP, planned for EUS at 1pm with ERCP on 11/5. - appreciate recs and guidance! -daily CBC, CMP -NPO at midnight -vitals per routine  -ibuprofen PRN, avoid tylenol  Iodine contrast allergy, stable, chronic: Burning and pruritus shortly after administration of the CT contrast.  Patient given Solu-Medrol, fluid bolus, and Benadryl.  Contrast allergy added to problem list.  According to multiple literature sources, patient does not require premedication for gadolinium based contrast prior to his MRI. - No report of reaction to MRI/MCRP.  Tobacco use: 1ppd tobacco use since age 60  -nicotine patch -encourage cessation   Alcohol use: Drinks 4-6 beers/day. GGT 1038 on admission, indicating recent alcohol use. CIWA scores have been 0 overnight (11/4). -CIWA, continue to monitor -thiamine -folic acid  -multivitamin  FEN/GI: clears PPx: lovenox  Disposition: continue on med-surg  Subjective:  Patient was in MRI this morning. He and his wife has been told that there is a spot  on his pancreas that is concerning and that was the likely cause of his jaundice.  They understand that this spot may signify a cancerous lesion. He is very tearful this morning since the results of the scan. He has continued minor RUQ abdominal pain but otherwise reports feeling well. He denies nausea, vomiting, abdominal pain, and diarrhea.    Objective: Temp:  [97.9 F (36.6 C)-98.5 F (36.9 C)] 98.1 F (36.7 C) (11/04 0540) Pulse Rate:  [64-69] 64 (11/04 0540) Resp:  [14-17] 14 (11/04 0540) BP: (118-133)/(74-81) 118/81 (11/04 0540) SpO2:  [94 %-96 %] 94 % (11/04 0540) Weight:  [88.7 kg] 88.7 kg (11/04 0540)  Physical Exam  Constitutional: He appears well-developed and well-nourished.  HENT:  Head: Normocephalic.  Eyes: EOM are normal.  Cardiovascular: Normal rate and regular rhythm.  Pulmonary/Chest: Effort normal.  Abdominal: Normal appearance.  Mild tenderness to palpation of RUQ  Neurological: He is alert.  Skin: Skin is warm and dry. Capillary refill takes less than 2 seconds.  Psychiatric: He has a normal mood and affect.   Laboratory: Recent Labs  Lab 06/07/18 1604 06/08/18 0550  WBC 5.0 3.5*  HGB 14.6 13.5  HCT 44.5 41.6  PLT 174 161   Recent Labs  Lab 06/07/18 1604 06/07/18 1915 06/08/18 0550 06/09/18 0605  NA 138  --  136 137  K 3.9  --  3.7 3.3*  CL 108  --  107 106  CO2 22  --  21* 25  BUN 9  --  7 <5*  CREATININE 0.77  --  0.76 0.71  CALCIUM 9.3  --  8.8* 8.6*  PROT 7.2  --  6.3* 5.7*  BILITOT 10.7* 11.2* 10.0* 6.3*  ALKPHOS  306*  --  274* 242*  ALT 541*  --  569* 584*  AST 232*  --  277* 222*  GLUCOSE 115*  --  182* 150*   CA 19-9: pending HIV: non-reactive  Imaging/Diagnostic Tests: Ct Abdomen Pelvis W Contrast  Result Date: 06/07/2018 CLINICAL DATA:  Oranged colored urine.  Jaundice EXAM: CT ABDOMEN AND PELVIS WITH CONTRAST TECHNIQUE: Multidetector CT imaging of the abdomen and pelvis was performed using the standard protocol following  bolus administration of intravenous contrast. CONTRAST:  166mL OMNIPAQUE IOHEXOL 300 MG/ML  SOLN COMPARISON:  None FINDINGS: Lower chest: No acute abnormality. Hepatobiliary: Small intermediate attenuating structure within segment 4a measures 8 mm, image 15/3. Indeterminate. There is mild intrahepatic bile duct dilatation. Moderate distension of the gallbladder. The common bile duct is increased in caliber measuring 1.6 cm. The dilated CBD terminates abruptly at the level of the head of pancreas, image 45/6. Pancreas: There is a mass centered around the neck of pancreas which measures 3.5 by 2.3 by 2.1 cm. Atrophy of the body and tail of pancreas with increase caliber of the main duct is noted. Encasement of the portal venous confluence is suspected. The main portal vein and the splenic vein remain patent. The celiac trunk and SMA appear uninvolved. Spleen: Normal in size without focal abnormality. Adrenals/Urinary Tract: The adrenal glands appear normal. Small hypodensity within the posterior cortex of the right kidney measures 6 mm, image 45/3. Too small to characterize. No hydronephrosis. Urinary bladder appears normal. Stomach/Bowel: The stomach and the small bowel loops are unremarkable. The appendix is visualized and appears normal. Unremarkable appearance of the colon. Vascular/Lymphatic: Aortic atherosclerosis without aneurysm. No adenopathy. Reproductive: Prostate is unremarkable. Other: No ascites.  No peritoneal nodule or mass Musculoskeletal: No acute or significant osseous findings. IMPRESSION: 1. There is a low attenuation mass centered around the neck of pancreas which is concerning for neoplasm. Pancreatic adenocarcinoma favored. This results in common bile duct obstruction with mild intrahepatic biliary ductal dilatation. There also is involvement of the portal venous confluence. Further evaluation with nonemergent contrast enhanced MRI of the pancreas is recommended. 2. Small indeterminate  low-attenuation structure is noted within segment 4 a of the liver. This could be better addressed at MRI. Electronically Signed   By: Kerby Moors M.D.   On: 06/07/2018 23:37   Mr Abdomen Mrcp Moise Boring Contast  Result Date: 06/09/2018 CLINICAL DATA:  Jaundice and weight loss. Pancreatic mass seen on CT. EXAM: MRI ABDOMEN WITHOUT AND WITH CONTRAST (INCLUDING MRCP) TECHNIQUE: Multiplanar multisequence MR imaging of the abdomen was performed both before and after the administration of intravenous contrast. Heavily T2-weighted images of the biliary and pancreatic ducts were obtained, and three-dimensional MRCP images were rendered by post processing. CONTRAST:  8 mL Gadavist COMPARISON:  CT abdomen and pelvis 06/07/2018 FINDINGS: Motion artifact limits examination. Lower chest: Atelectasis in the lung bases. Hepatobiliary: Intra and extrahepatic bile duct dilatation with abrupt change in caliber of the mid common bile duct suggesting an obstructing mass or stricture. Gallbladder is distended without wall thickening or edema. No stones identified. 9 mm focal lesion in segment 4 appears to enhance in the periphery. This could represent a small hemangioma. Metastasis less likely. Pancreas: Hypoenhancing mass at the junction of the head and neck of the pancreas measuring 2.3 cm diameter. Pancreatic ductal dilatation. Likely adenocarcinoma. No significant infiltration or vascular involvement. Spleen:  Within normal limits in size and appearance. Adrenals/Urinary Tract: No masses identified. No evidence of hydronephrosis. Stomach/Bowel: Visualized portions  within the abdomen are unremarkable. Vascular/Lymphatic: No pathologically enlarged lymph nodes identified. No abdominal aortic aneurysm demonstrated. Other:  None. Musculoskeletal: No focal bone lesions identified. IMPRESSION: 2.3 cm diameter hypoenhancing mass at the junction of the head and neck of pancreas with pancreatic ductal dilatation, likely adenocarcinoma.  Intra and extrahepatic bile duct dilatation with abrupt change in caliber at the mid common bile duct. This could indicate an obstructing mass or stricture. Electronically Signed   By: Lucienne Capers M.D.   On: 06/09/2018 05:15   Daisy Floro, DO 06/09/2018, 8:03 AM PGY-1, [facility] Intern pager: [phone], text pages welcome

## 2018-06-10 ENCOUNTER — Inpatient Hospital Stay (HOSPITAL_COMMUNITY): Payer: Managed Care, Other (non HMO) | Admitting: Certified Registered Nurse Anesthetist

## 2018-06-10 ENCOUNTER — Encounter (HOSPITAL_COMMUNITY): Payer: Self-pay | Admitting: *Deleted

## 2018-06-10 ENCOUNTER — Inpatient Hospital Stay (HOSPITAL_COMMUNITY): Payer: Managed Care, Other (non HMO)

## 2018-06-10 ENCOUNTER — Encounter (HOSPITAL_COMMUNITY)
Admission: EM | Disposition: A | Discharge status: Home / Self Care | Payer: Self-pay | Source: Home / Self Care | Attending: Family Medicine

## 2018-06-10 DIAGNOSIS — D490 Neoplasm of unspecified behavior of digestive system: Secondary | ICD-10-CM

## 2018-06-10 DIAGNOSIS — Z720 Tobacco use: Secondary | ICD-10-CM

## 2018-06-10 DIAGNOSIS — K8689 Other specified diseases of pancreas: Secondary | ICD-10-CM

## 2018-06-10 HISTORY — PX: ESOPHAGOGASTRODUODENOSCOPY (EGD) WITH PROPOFOL: SHX5813

## 2018-06-10 HISTORY — PX: SPHINCTEROTOMY: SHX5544

## 2018-06-10 HISTORY — PX: ERCP: SHX5425

## 2018-06-10 HISTORY — PX: UPPER ESOPHAGEAL ENDOSCOPIC ULTRASOUND (EUS): SHX6562

## 2018-06-10 HISTORY — PX: FINE NEEDLE ASPIRATION: SHX5430

## 2018-06-10 HISTORY — PX: BILIARY STENT PLACEMENT: SHX5538

## 2018-06-10 LAB — COMPREHENSIVE METABOLIC PANEL
ALBUMIN: 2.9 g/dL — AB (ref 3.5–5.0)
ALT: 561 U/L — ABNORMAL HIGH (ref 0–44)
ANION GAP: 5 (ref 5–15)
AST: 207 U/L — ABNORMAL HIGH (ref 15–41)
Alkaline Phosphatase: 211 U/L — ABNORMAL HIGH (ref 38–126)
BILIRUBIN TOTAL: 7.8 mg/dL — AB (ref 0.3–1.2)
BUN: 11 mg/dL (ref 6–20)
CO2: 25 mmol/L (ref 22–32)
Calcium: 8.5 mg/dL — ABNORMAL LOW (ref 8.9–10.3)
Chloride: 107 mmol/L (ref 98–111)
Creatinine, Ser: 0.74 mg/dL (ref 0.61–1.24)
GFR calc Af Amer: >60 mL/min (ref >60)
GFR calc non Af Amer: >60 mL/min (ref >60)
GLUCOSE: 110 mg/dL — AB (ref 70–99)
POTASSIUM: 4 mmol/L (ref 3.5–5.1)
Sodium: 137 mmol/L (ref 135–145)
TOTAL PROTEIN: 5.5 g/dL — AB (ref 6.5–8.1)

## 2018-06-10 LAB — HEPATITIS PANEL, ACUTE
HCV Ab: <0.1 s/co ratio (ref 0.0–0.9)
HEP A IGM: NEGATIVE
Hep B C IgM: NEGATIVE
Hepatitis B Surface Ag: NEGATIVE

## 2018-06-10 SURGERY — UPPER ESOPHAGEAL ENDOSCOPIC ULTRASOUND (EUS)
Anesthesia: General

## 2018-06-10 MED ORDER — DIPHENHYDRAMINE HCL 50 MG/ML IJ SOLN
INTRAMUSCULAR | Status: AC
  Filled 2018-06-10: qty 1

## 2018-06-10 MED ORDER — INDOMETHACIN 50 MG RE SUPP
RECTAL | Status: AC
  Filled 2018-06-10: qty 2

## 2018-06-10 MED ORDER — IOPAMIDOL (ISOVUE-300) INJECTION 61%
INTRAVENOUS | Status: AC
  Filled 2018-06-10: qty 100

## 2018-06-10 MED ORDER — MIDAZOLAM HCL 5 MG/5ML IJ SOLN
INTRAMUSCULAR | Status: DC | PRN
  Administered 2018-06-10: 2 mg via INTRAVENOUS

## 2018-06-10 MED ORDER — LIDOCAINE 2% (20 MG/ML) 5 ML SYRINGE
INTRAMUSCULAR | Status: DC | PRN
  Administered 2018-06-10: 10 mg via INTRAVENOUS

## 2018-06-10 MED ORDER — FENTANYL CITRATE (PF) 100 MCG/2ML IJ SOLN
INTRAMUSCULAR | Status: DC | PRN
  Administered 2018-06-10 (×2): 50 ug via INTRAVENOUS

## 2018-06-10 MED ORDER — LACTATED RINGERS IV SOLN
INTRAVENOUS | Status: DC | PRN
  Administered 2018-06-10 (×2): via INTRAVENOUS

## 2018-06-10 MED ORDER — ONDANSETRON HCL 4 MG/2ML IJ SOLN
INTRAMUSCULAR | Status: DC | PRN
  Administered 2018-06-10: 4 mg via INTRAVENOUS

## 2018-06-10 MED ORDER — CIPROFLOXACIN IN D5W 400 MG/200ML IV SOLN
INTRAVENOUS | Status: AC
  Filled 2018-06-10: qty 200

## 2018-06-10 MED ORDER — METHYLPREDNISOLONE SODIUM SUCC 125 MG IJ SOLR
40.0000 mg | Freq: Once | INTRAMUSCULAR | Status: AC
  Administered 2018-06-10: 40 mg via INTRAVENOUS

## 2018-06-10 MED ORDER — SODIUM CHLORIDE 0.9 % IV SOLN
INTRAVENOUS | Status: DC | PRN
  Administered 2018-06-10: 30 ug/min via INTRAVENOUS

## 2018-06-10 MED ORDER — INDOMETHACIN 50 MG RE SUPP
RECTAL | Status: DC | PRN
  Administered 2018-06-10: 100 mg via RECTAL

## 2018-06-10 MED ORDER — GLUCAGON HCL RDNA (DIAGNOSTIC) 1 MG IJ SOLR
INTRAMUSCULAR | Status: DC | PRN
  Administered 2018-06-10: .5 mg via INTRAVENOUS

## 2018-06-10 MED ORDER — PHENYLEPHRINE 40 MCG/ML (10ML) SYRINGE FOR IV PUSH (FOR BLOOD PRESSURE SUPPORT)
PREFILLED_SYRINGE | INTRAVENOUS | Status: DC | PRN
  Administered 2018-06-10 (×2): 80 ug via INTRAVENOUS
  Administered 2018-06-10: 40 ug via INTRAVENOUS
  Administered 2018-06-10: 80 ug via INTRAVENOUS

## 2018-06-10 MED ORDER — IOPAMIDOL (ISOVUE-300) INJECTION 61%
INTRAVENOUS | Status: DC | PRN
  Administered 2018-06-10: 30 mL

## 2018-06-10 MED ORDER — PROPOFOL 10 MG/ML IV BOLUS
INTRAVENOUS | Status: DC | PRN
  Administered 2018-06-10: 200 mg via INTRAVENOUS

## 2018-06-10 MED ORDER — SUGAMMADEX SODIUM 200 MG/2ML IV SOLN
INTRAVENOUS | Status: DC | PRN
  Administered 2018-06-10: 200 mg via INTRAVENOUS

## 2018-06-10 MED ORDER — ROCURONIUM BROMIDE 10 MG/ML (PF) SYRINGE
PREFILLED_SYRINGE | INTRAVENOUS | Status: DC | PRN
  Administered 2018-06-10: 50 mg via INTRAVENOUS
  Administered 2018-06-10 (×2): 20 mg via INTRAVENOUS

## 2018-06-10 MED ORDER — CIPROFLOXACIN IN D5W 400 MG/200ML IV SOLN
INTRAVENOUS | Status: DC | PRN
  Administered 2018-06-10: 400 mg via INTRAVENOUS

## 2018-06-10 MED ORDER — DIPHENHYDRAMINE HCL 50 MG/ML IJ SOLN
25.0000 mg | Freq: Once | INTRAMUSCULAR | Status: AC
  Administered 2018-06-10: 50 mg via INTRAVENOUS

## 2018-06-10 MED ORDER — GLUCAGON HCL RDNA (DIAGNOSTIC) 1 MG IJ SOLR
INTRAMUSCULAR | Status: AC
  Filled 2018-06-10: qty 2

## 2018-06-10 MED ORDER — ALUM & MAG HYDROXIDE-SIMETH 200-200-20 MG/5ML PO SUSP
30.0000 mL | ORAL | Status: DC | PRN
  Administered 2018-06-09 – 2018-06-11 (×2): 30 mL via ORAL
  Filled 2018-06-10 (×2): qty 30

## 2018-06-10 SURGICAL SUPPLY — 15 items

## 2018-06-10 NOTE — Progress Notes (Signed)
Family Medicine Teaching Service Daily Progress Note Intern Pager: [phone]  Patient name: Ernest Wu Medical record number: [id number] Date of birth: 05-10-1961 Age: 57 y.o. Gender: male  Primary Care Provider: System, Pcp Not In Consultants:GI, Surgery  Code Status:full  Pt Overview and Major Events to Date: 11/2 - Admitted, pancreatic mass visualized on CT 11/4 - MRI, MRCP 11/5 - EUS, Biopsy, Stent placement  Assessment and Plan:  Shawnn Bouillon Luckis a 57 y.o.malepresenting with jaundice. History of tobacco use disorder.  Pancreatic mass Likely pancreatic adenocarcinoma visualized on CT abdomen/pelvis with contrast on admission. Patient has had weight loss and jaundice, likely due to obstruction of common bile duct from mass. Mixed direct and indirect hyperbilirubinemia also indicative of obstructive process. MRCP (11/4) shows mass (most likely adenocarcinoma) of Pancreatic head and neck with common bile duct obstruction.  -GI consult: recommend MRI and MRCP, planned for EUS at 1pm with ERCP and Biopsy on 11/5, consulted surgery to discuss possibility of resection, if biopsy returns positive can consult oncology - appreciate recs and guidance! -dailyCBC, CMP -vitals per routine  -ibuprofen PRN, avoid tylenol  Tobacco use: 1ppd tobacco use since age 31  -nicotine patch -encourage cessation   Alcohol use: Drinks 4-6 beers/day.GGT 1038 on admission, indicating recent alcohol use.CIWAscores have been 0 overnight (11/4). -CIWA, continue to monitor -thiamine -folic acid  -multivitamin  Iodine contrast allergy, stable, chronic, resolved: Burning and pruritus shortly after administration of the CT contrast. Patient given Solu-Medrol, fluid bolus, and Benadryl. Contrast allergy added to problem list.According to multiple literature sources, patient does not require premedication for gadolinium based contrast prior to his MRI. - No report of reaction to  MRI/MCRP.  FEN/GI:clears YFV:CBSWHQP  Disposition:continue on med-surg  Subjective:  Patient is seen this morning resting in bed with his wife at bedside. He was explaining the plan for imaging, biopsy and stent placement today which makes him hopeful. He denies nausea, vomiting, and abdominal pain. He is asking if he can go home after the procedures.  Objective: Temp:  [97.5 F (36.4 C)-98.3 F (36.8 C)] 97.5 F (36.4 C) (11/05 0542) Pulse Rate:  [54-64] 54 (11/05 0542) Resp:  [18-20] 18 (11/05 0542) BP: (116-128)/(78-86) 116/86 (11/05 0542) SpO2:  [95 %-96 %] 95 % (11/05 0542) Weight:  [88.4 kg] 88.4 kg (11/04 2128)  Physical Exam  Constitutional: He appears well-developed and well-nourished.  HENT:  Head: Normocephalic.  Eyes: EOM are normal.  Cardiovascular: Normal rate and regular rhythm.  Pulmonary/Chest: Effort normal.  Abdominal: Normal appearance.  Mild tenderness to palpation of RUQ  Neurological: He is alert.  Skin: Skin is warm and dry. Capillary refill takes less than 2 seconds.  Psychiatric: He has a normal mood and affect.   Laboratory: Recent Labs  Lab 06/07/18 1604 06/08/18 0550  WBC 5.0 3.5*  HGB 14.6 13.5  HCT 44.5 41.6  PLT 174 161   Recent Labs  Lab 06/08/18 0550 06/09/18 0605 06/10/18 0539  NA 136 137 137  K 3.7 3.3* 4.0  CL 107 106 107  CO2 21* 25 25  BUN 7 <5* 11  CREATININE 0.76 0.71 0.74  CALCIUM 8.8* 8.6* 8.5*  PROT 6.3* 5.7* 5.5*  BILITOT 10.0* 6.3* 7.8*  ALKPHOS 274* 242* 211*  ALT 569* 584* 561*  AST 277* 222* 207*  GLUCOSE 182* 150* 110*   CA 19-9: elevated at 105 (rr 0-35) HIV: non-reactive  Imaging/Diagnostic Tests: Ct Abdomen Pelvis W Contrast  Result Date: 06/07/2018 CLINICAL DATA:  Oranged colored urine.  Jaundice EXAM: CT ABDOMEN AND PELVIS WITH CONTRAST TECHNIQUE: Multidetector CT imaging of the abdomen and pelvis was performed using the standard protocol following bolus administration of intravenous  contrast. CONTRAST:  145mL OMNIPAQUE IOHEXOL 300 MG/ML  SOLN COMPARISON:  None FINDINGS: Lower chest: No acute abnormality. Hepatobiliary: Small intermediate attenuating structure within segment 4a measures 8 mm, image 15/3. Indeterminate. There is mild intrahepatic bile duct dilatation. Moderate distension of the gallbladder. The common bile duct is increased in caliber measuring 1.6 cm. The dilated CBD terminates abruptly at the level of the head of pancreas, image 45/6. Pancreas: There is a mass centered around the neck of pancreas which measures 3.5 by 2.3 by 2.1 cm. Atrophy of the body and tail of pancreas with increase caliber of the main duct is noted. Encasement of the portal venous confluence is suspected. The main portal vein and the splenic vein remain patent. The celiac trunk and SMA appear uninvolved. Spleen: Normal in size without focal abnormality. Adrenals/Urinary Tract: The adrenal glands appear normal. Small hypodensity within the posterior cortex of the right kidney measures 6 mm, image 45/3. Too small to characterize. No hydronephrosis. Urinary bladder appears normal. Stomach/Bowel: The stomach and the small bowel loops are unremarkable. The appendix is visualized and appears normal. Unremarkable appearance of the colon. Vascular/Lymphatic: Aortic atherosclerosis without aneurysm. No adenopathy. Reproductive: Prostate is unremarkable. Other: No ascites.  No peritoneal nodule or mass Musculoskeletal: No acute or significant osseous findings. IMPRESSION: 1. There is a low attenuation mass centered around the neck of pancreas which is concerning for neoplasm. Pancreatic adenocarcinoma favored. This results in common bile duct obstruction with mild intrahepatic biliary ductal dilatation. There also is involvement of the portal venous confluence. Further evaluation with nonemergent contrast enhanced MRI of the pancreas is recommended. 2. Small indeterminate low-attenuation structure is noted within  segment 4 a of the liver. This could be better addressed at MRI. Electronically Signed   By: Kerby Moors M.D.   On: 06/07/2018 23:37   Mr Abdomen Mrcp Moise Boring Contast  Addendum Date: 06/09/2018   ADDENDUM REPORT: 06/09/2018 09:47 ADDENDUM: Request made by Dr. Ronald Lobo GI service to evaluate for vascular involvement by the pancreatic mass. The pancreatic mass involves approximately 40 percent of the main portal vein circumference at the portal splenic venous confluence, with associated mild narrowing of the portal splenic venous confluence. The SMV, splenic vein and main, right and left portal veins remain patent. The celiac trunk and SMA are not involved by the pancreatic mass. These addended results were called by telephone at the time of interpretation on 06/09/2018 at 9:45 am to Dr. Cristina Gong, who verbally acknowledged these results. Electronically Signed   By: Ilona Sorrel M.D.   On: 06/09/2018 09:47   Result Date: 06/09/2018 CLINICAL DATA:  Jaundice and weight loss. Pancreatic mass seen on CT. EXAM: MRI ABDOMEN WITHOUT AND WITH CONTRAST (INCLUDING MRCP) TECHNIQUE: Multiplanar multisequence MR imaging of the abdomen was performed both before and after the administration of intravenous contrast. Heavily T2-weighted images of the biliary and pancreatic ducts were obtained, and three-dimensional MRCP images were rendered by post processing. CONTRAST:  8 mL Gadavist COMPARISON:  CT abdomen and pelvis 06/07/2018 FINDINGS: Motion artifact limits examination. Lower chest: Atelectasis in the lung bases. Hepatobiliary: Intra and extrahepatic bile duct dilatation with abrupt change in caliber of the mid common bile duct suggesting an obstructing mass or stricture. Gallbladder is distended without wall thickening or edema. No stones identified. 9 mm focal lesion in segment  4 appears to enhance in the periphery. This could represent a small hemangioma. Metastasis less likely. Pancreas: Hypoenhancing mass at the  junction of the head and neck of the pancreas measuring 2.3 cm diameter. Pancreatic ductal dilatation. Likely adenocarcinoma. No significant infiltration or vascular involvement. Spleen:  Within normal limits in size and appearance. Adrenals/Urinary Tract: No masses identified. No evidence of hydronephrosis. Stomach/Bowel: Visualized portions within the abdomen are unremarkable. Vascular/Lymphatic: No pathologically enlarged lymph nodes identified. No abdominal aortic aneurysm demonstrated. Other:  None. Musculoskeletal: No focal bone lesions identified. IMPRESSION: 2.3 cm diameter hypoenhancing mass at the junction of the head and neck of pancreas with pancreatic ductal dilatation, likely adenocarcinoma. Intra and extrahepatic bile duct dilatation with abrupt change in caliber at the mid common bile duct. This could indicate an obstructing mass or stricture. Electronically Signed: By: Lucienne Capers M.D. On: 06/09/2018 05:15     Daisy Floro, DO 06/10/2018, 7:42 AM PGY-1, [place] Intern pager: [phone], text pages welcome

## 2018-06-10 NOTE — Brief Op Note (Signed)
06/07/2018 - 06/10/2018  3:36 PM  PATIENT:  Ernest Wu  57 y.o. male  PRE-OPERATIVE DIAGNOSIS:  CBD obstruction,pancreatic neck mass  POST-OPERATIVE DIAGNOSIS:  FNA pancreatic mass, sphincterotomy, ballon extraction, metal stent placement  PROCEDURE:  Procedure(s): UPPER ESOPHAGEAL ENDOSCOPIC ULTRASOUND (EUS) (N/A) ENDOSCOPIC RETROGRADE CHOLANGIOPANCREATOGRAPHY (ERCP) (N/A) ESOPHAGOGASTRODUODENOSCOPY (EGD) WITH PROPOFOL (N/A) FINE NEEDLE ASPIRATION (FNA) LINEAR  SURGEON:  Surgeon(s) and Role:    Ronnette Juniper, MD - Primary  PHYSICIAN ASSISTANT:   ASSISTANTS: Kingsley Plan, RN, Tinnie Gens, Tech  ANESTHESIA:   MAC  EBL:  None  BLOOD ADMINISTERED:none  DRAINS: none   LOCAL MEDICATIONS USED:  NONE  SPECIMEN:  No Specimen  DISPOSITION OF SPECIMEN:  N/A  COUNTS:  YES  TOURNIQUET:  * No tourniquets in log *  DICTATION: .Dragon Dictation  PLAN OF CARE: Admit to inpatient   PATIENT DISPOSITION:  PACU - hemodynamically stable.   Delay start of Pharmacological VTE agent (>24hrs) due to surgical blood loss or risk of bleeding: no

## 2018-06-10 NOTE — Progress Notes (Signed)
Ca 19-9 level of 105 (mildly elevated) noted.  Cleotis Nipper, M.D. Pager [phone] If no answer or after 5 PM call [phone]

## 2018-06-10 NOTE — Op Note (Signed)
[facility] Patient Name: Ernest Wu Procedure Date : 06/10/2018 MRN: [id number] Attending MD: Ronnette Juniper , MD Date of Birth: 1961-04-18 CSN: [id number] Age: 57 Admit Type: Inpatient Procedure:                ERCP Indications:              Jaundice, Malignant tumor of the head of pancreas Providers:                Ronnette Juniper, MD, Carlyn Reichert, RN, Tinnie Gens,                            Technician, Lance Coon, CRNA Referring MD:              Medicines:                Monitored Anesthesia Care Complications:            No immediate complications. Estimated blood loss:                            None Estimated Blood Loss:     Estimated blood loss: none. Procedure:                Pre-Anesthesia Assessment:                           - Prior to the procedure, a History and Physical                            was performed, and patient medications and                            allergies were reviewed. The patient's tolerance of                            previous anesthesia was also reviewed. The risks                            and benefits of the procedure and the sedation                            options and risks were discussed with the patient.                            All questions were answered, and informed consent                            was obtained. Prior Anticoagulants: The patient has                            taken no previous anticoagulant or antiplatelet                            agents. ASA Grade Assessment: II - A patient with  mild systemic disease. After reviewing the risks                            and benefits, the patient was deemed in                            satisfactory condition to undergo the procedure.                           - Prior to the procedure, a History and Physical                            was performed, and patient medications and                            allergies were reviewed. The  patient's tolerance of                            previous anesthesia was also reviewed. The risks                            and benefits of the procedure and the sedation                            options and risks were discussed with the patient.                            All questions were answered, and informed consent                            was obtained. Prior Anticoagulants: The patient has                            taken no previous anticoagulant or antiplatelet                            agents. ASA Grade Assessment: II - A patient with                            mild systemic disease. After reviewing the risks                            and benefits, the patient was deemed in                            satisfactory condition to undergo the procedure.                           After obtaining informed consent, the scope was                            passed under direct vision. Throughout the  procedure, the patient's blood pressure, pulse, and                            oxygen saturations were monitored continuously. The                            [id number] ([id number]) Olympus ERCP was introduced                            through the mouth, and used to inject contrast into                            and used to inject contrast into the bile duct. The                            ERCP was accomplished without difficulty. The                            patient tolerated the procedure well. Scope In: Scope Out: Findings:      The scout film was normal. The esophagus was successfully intubated       under direct vision. The scope was advanced to a normal major papilla in       the descending duodenum without detailed examination of the pharynx,       larynx and associated structures, and upper GI tract. The upper GI tract       was grossly normal. The pancreatic duct was canulated several times in       an attempt to canulate the CBD. The wire was left  in the pancreatic       duct, no injection was performed. The sphinctertome was reloaded with a       wire and the CBD was canulated after multiple attempts and changing the       angle of the tip with the bow. The bile duct was subsequently deeply       cannulated with the sphincterotome. Contrast was injected. I personally       interpreted the bile duct images. There was brisk flow of contrast       through the ducts. Image quality was excellent. Contrast extended to the       entire biliary tree,however, the lower third of the main bile duct did       not opacify, likely due to extrinsic compression from pancreatic mass,       it appeared to involve the lower 5cm of (distal) CBD. The middle third       of the main bile duct and upper third of the main bile duct were       moderately dilated. A straight Roadrunner wire was passed into the       biliary tree. A 10 mm biliary sphincterotomy was made with a braided       sphincterotome using ERBE electrocautery. There was no       post-sphincterotomy bleeding. The biliary tree was swept with a 12 mm       balloon starting at the bifurcation up until the narrowing in distal       CBD, then it was deflated before sweeping the distal CBD post stenotic  area. Nothing was found. One 10 mm by 6 cm covered metal stent was       placed 5.5 cm into the common bile duct with the proximal end beyond the       strictured segment. Bile flowed through the stent. The stent was in good       position. Impression:               - Distal biliary stricture likely related to                            extrinsic compression from malignant pancreatic                            mass.                           - The upper third of the main bile duct and middle                            third of the main bile duct were moderately dilated.                           - A biliary sphincterotomy was performed.                           - The biliary tree was  swept and nothing was found.                           - One covered metal stent was placed into the                            common bile duct. Recommendation:           - Advance diet as tolerated.                           - Check liver enzymes (AST, ALT, alkaline                            phosphatase, bilirubin) tomorrow.                           - Refer to an oncologist at appointment to be                            scheduled.                           - Refer to a Psychologist, sport and exercise. Procedure Code(s):        --- Professional ---                           [id number], Endoscopic retrograde                            cholangiopancreatography (ERCP); with placement of  endoscopic stent into biliary or pancreatic duct,                            including pre- and post-dilation and guide wire                            passage, when performed, including sphincterotomy,                            when performed, each stent                           [id number], Endoscopic catheterization of the biliary                            ductal system, radiological supervision and                            interpretation Diagnosis Code(s):        --- Professional ---                           K83.1, Obstruction of bile duct                           R17, Unspecified jaundice                           C25.0, Malignant neoplasm of head of pancreas                           K83.8, Other specified diseases of biliary tract CPT copyright 2018 American [employer] Association. All rights reserved. The codes documented in this report are preliminary and upon coder review may  be revised to meet current compliance requirements. Ronnette Juniper, MD 06/10/2018 3:35:33 PM This report has been signed electronically. Number of Addenda: 0

## 2018-06-10 NOTE — Op Note (Signed)
[facility] Patient Name: Ernest Wu Procedure Date : 06/10/2018 MRN: [id number] Attending MD: Arta Silence , MD Date of Birth: 07/17/1961 CSN: [id number] Age: 57 Admit Type: Inpatient Procedure:                Upper EUS Indications:              Dilated pancreatic duct on CT scan, Suspected mass                            in pancreas on CT scan, Obstruction of bile duct on                            CT, Elevated liver enzymes Providers:                Carlyn Reichert, RN, Tinnie Gens, Technician, Lance Coon, CRNA, Arta Silence, MD Referring MD:              Medicines:                General Anesthesia Complications:            No immediate complications. Estimated Blood Loss:     Estimated blood loss was minimal. Procedure:                Pre-Anesthesia Assessment:                           - Prior to the procedure, a History and Physical                            was performed, and patient medications and                            allergies were reviewed. The patient's tolerance of                            previous anesthesia was also reviewed. The risks                            and benefits of the procedure and the sedation                            options and risks were discussed with the patient.                            All questions were answered, and informed consent                            was obtained. Prior Anticoagulants: The patient has                            taken no previous anticoagulant or antiplatelet  agents. ASA Grade Assessment: II - A patient with                            mild systemic disease. After reviewing the risks                            and benefits, the patient was deemed in                            satisfactory condition to undergo the procedure.                           After obtaining informed consent, the endoscope was                            passed  under direct vision. Throughout the                            procedure, the patient's blood pressure, pulse, and                            oxygen saturations were monitored continuously. The                            [id number] ([id number]) Olympus Linear EUS was                            introduced through the mouth, and advanced to the                            second part of duodenum. The upper EUS was                            accomplished without difficulty. The patient                            tolerated the procedure well. Scope In: Scope Out: Findings:      ENDOSONOGRAPHIC FINDING: :      There was dilation in the common bile duct which measured up to 12 mm.      A round mass was identified in the pancreatic head. The mass was       hypoechoic. The mass measured 17 mm by 25 mm in maximal cross-sectional       diameter. The endosonographic borders were well-defined. There was       sonographic evidence suggesting invasion into the portal vein       (manifested by invasion), the superior mesenteric vein (manifested by       abutment), the splenic vein (manifested by abutment) and the       splenoportal confluence (manifested by invasion). The remainder of the       pancreas was examined. The endosonographic appearance of parenchyma and       the upstream pancreatic duct indicated duct dilation. Fine needle       aspiration for cytology was performed. Color Doppler imaging was  utilized prior to needle puncture to confirm a lack of significant       vascular structures within the needle path. Six passes were made with       the 25 gauge needle using a transduodenal approach. A stylet was used. A       cytotechnologist was present to evaluate the adequacy of the specimen.       Final cytology results are pending.      A few lymph nodes were visualized with the ultrasound probe in the       peripancreatic region. The nodes were round, hypoechoic and had well       defined  margins.      There was no sign of significant endosonographic abnormality in the left       lobe of the liver. Impression:               - There was dilation in the common bile duct which                            measured up to 12 mm.                           - A mass was identified in the pancreatic head.                            This was staged T3 N1 Mx by endosonographic                            criteria. Fine needle aspiration performed.                           - A few lymph nodes were visualized and measured in                            the peripancreatic region.                           - There was no evidence of significant pathology in                            the left lobe of the liver. Moderate Sedation:      None Recommendation:           - Await cytology results.                           - Perform an ERCP today with Dr. Therisa Doyne. Procedure Code(s):        --- Professional ---                           [phone], Esophagogastroduodenoscopy, flexible,                            transoral; with transendoscopic ultrasound-guided                            intramural or transmural fine needle  aspiration/biopsy(s) (includes endoscopic                            ultrasound examination of the esophagus, stomach,                            and either the duodenum or a surgically altered                            stomach where the jejunum is examined distal to the                            anastomosis) Diagnosis Code(s):        --- Professional ---                           K86.89, Other specified diseases of pancreas                           K83.1, Obstruction of bile duct                           R74.8, Abnormal levels of other serum enzymes                           K83.8, Other specified diseases of biliary tract                           R93.3, Abnormal findings on diagnostic imaging of                            other parts of digestive  tract CPT copyright 2018 American Medical Association. All rights reserved. The codes documented in this report are preliminary and upon coder review may  be revised to meet current compliance requirements. Arta Silence, MD 06/10/2018 2:25:13 PM This report has been signed electronically. Number of Addenda: 0

## 2018-06-10 NOTE — Transfer of Care (Signed)
Immediate Anesthesia Transfer of Care Note  Patient: Ernest Wu  Procedure(s) Performed: UPPER ESOPHAGEAL ENDOSCOPIC ULTRASOUND (EUS) (N/A ) ENDOSCOPIC RETROGRADE CHOLANGIOPANCREATOGRAPHY (ERCP) (N/A ) ESOPHAGOGASTRODUODENOSCOPY (EGD) WITH PROPOFOL (N/A ) FINE NEEDLE ASPIRATION (FNA) LINEAR  Patient Location: PACU  Anesthesia Type:General  Level of Consciousness: awake and patient cooperative  Airway & Oxygen Therapy: Patient Spontanous Breathing and Patient connected to nasal cannula oxygen  Post-op Assessment: Report given to RN, Post -op Vital signs reviewed and stable and Patient moving all extremities  Post vital signs: Reviewed and stable  Last Vitals:  Vitals Value Taken Time  BP 123/88 06/10/2018  3:40 PM  Temp 36.4 C 06/10/2018  3:40 PM  Pulse 68 06/10/2018  3:40 PM  Resp 17 06/10/2018  3:40 PM  SpO2 96 % 06/10/2018  3:40 PM    Last Pain:  Vitals:   06/10/18 1540  TempSrc: Axillary  PainSc: 0-No pain         Complications: No apparent anesthesia complications

## 2018-06-10 NOTE — Anesthesia Procedure Notes (Signed)
Procedure Name: Intubation Performed by: Milford Cage, CRNA Pre-anesthesia Checklist: Patient identified, Emergency Drugs available, Suction available and Patient being monitored Patient Re-evaluated:Patient Re-evaluated prior to induction Oxygen Delivery Method: Circle System Utilized Preoxygenation: Pre-oxygenation with 100% oxygen Induction Type: IV induction Ventilation: Mask ventilation without difficulty and Oral airway inserted - appropriate to patient size Laryngoscope Size: Mac and 4 Grade View: Grade II Tube type: Oral Tube size: 7.5 mm Number of attempts: 1 Airway Equipment and Method: Stylet and Oral airway Placement Confirmation: ETT inserted through vocal cords under direct vision,  positive ETCO2 and breath sounds checked- equal and bilateral Secured at: 23 cm Tube secured with: Tape Dental Injury: Teeth and Oropharynx as per pre-operative assessment

## 2018-06-10 NOTE — Anesthesia Postprocedure Evaluation (Signed)
Anesthesia Post Note  Patient: Ernest Wu  Procedure(s) Performed: UPPER ESOPHAGEAL ENDOSCOPIC ULTRASOUND (EUS) (N/A ) ENDOSCOPIC RETROGRADE CHOLANGIOPANCREATOGRAPHY (ERCP) (N/A ) ESOPHAGOGASTRODUODENOSCOPY (EGD) WITH PROPOFOL (N/A ) FINE NEEDLE ASPIRATION (FNA) LINEAR     Patient location during evaluation: PACU Anesthesia Type: General Level of consciousness: awake and alert Pain management: pain level controlled Vital Signs Assessment: post-procedure vital signs reviewed and stable Respiratory status: spontaneous breathing, nonlabored ventilation, respiratory function stable and patient connected to nasal cannula oxygen Cardiovascular status: blood pressure returned to baseline and stable Postop Assessment: no apparent nausea or vomiting Anesthetic complications: no    Last Vitals:  Vitals:   06/10/18 1615 06/10/18 1642  BP: (!) 118/91 (!) 142/89  Pulse: 61 (!) 53  Resp: 16 19  Temp:  36.6 C  SpO2: 97% 97%    Last Pain:  Vitals:   06/10/18 1642  TempSrc: Oral  PainSc:                  [name]

## 2018-06-10 NOTE — Anesthesia Preprocedure Evaluation (Signed)
Anesthesia Evaluation  Patient identified by MRN, date of birth, ID band Patient awake    Reviewed: Allergy & Precautions, NPO status , Patient's Chart, lab work & pertinent test results  Airway Mallampati: II  TM Distance: >3 FB Neck ROM: Full    Dental no notable dental hx.    Pulmonary Current Smoker,    Pulmonary exam normal breath sounds clear to auscultation       Cardiovascular negative cardio ROS Normal cardiovascular exam Rhythm:Regular Rate:Normal     Neuro/Psych negative neurological ROS  negative psych ROS   GI/Hepatic negative GI ROS, Neg liver ROS,   Endo/Other  negative endocrine ROS  Renal/GU negative Renal ROS  negative genitourinary   Musculoskeletal negative musculoskeletal ROS (+)   Abdominal   Peds negative pediatric ROS (+)  Hematology negative hematology ROS (+)   Anesthesia Other Findings   Reproductive/Obstetrics negative OB ROS                             Anesthesia Physical Anesthesia Plan  ASA: II  Anesthesia Plan: General   Post-op Pain Management:    Induction: Intravenous  PONV Risk Score and Plan: 1 and Ondansetron and Treatment may vary due to age or medical condition  Airway Management Planned: Oral ETT  Additional Equipment:   Intra-op Plan:   Post-operative Plan: Extubation in OR  Informed Consent: I have reviewed the patients History and Physical, chart, labs and discussed the procedure including the risks, benefits and alternatives for the proposed anesthesia with the patient or authorized representative who has indicated his/her understanding and acceptance.   Dental advisory given  Plan Discussed with: CRNA  Anesthesia Plan Comments:         Anesthesia Quick Evaluation

## 2018-06-10 NOTE — Interval H&P Note (Signed)
History and Physical Interval Note: 57/male with pancreatic neck mass causing obstructive jaundice for EUS with FNA and ERCP for stent placement. 06/10/2018 12:43 PM  Ernest Wu  has presented today for EUS and ERCP, with the diagnosis of CBD obstruction,pancreatic neck mass  The various methods of treatment have been discussed with the patient and family. After consideration of risks, benefits and other options for treatment, the patient has consented to  Procedure(s): UPPER ESOPHAGEAL ENDOSCOPIC ULTRASOUND (EUS) (N/A) ENDOSCOPIC RETROGRADE CHOLANGIOPANCREATOGRAPHY (ERCP) (N/A) ESOPHAGOGASTRODUODENOSCOPY (EGD) WITH PROPOFOL (N/A) as a surgical intervention .  The patient's history has been reviewed, patient examined, no change in status, stable for surgery.  I have reviewed the patient's chart and labs.  Questions were answered to the patient's satisfaction.     Ronnette Juniper

## 2018-06-10 NOTE — Op Note (Signed)
ERCP findings:  Distal 5 cm of common bile duct did not opacify with contrast injection likely due to extrinsic compression from malignant pancreatic mass. A fully covered 10 mm 6 cm metal stent was deployed in the common bile duct with the proximal extent beyond the strictured segment. Good flow of bile was noted post-stent placement and the stent appeared to be in good position on x-ray.  Wire was advanced into the pancreatic duct several times during attempted CBD cannulation. Pancreatic duct was not injected. Patient was given a dose of rectal indomethacin post procedure.  Recommendations: Advance diet as tolerated Liver enzymes in a.m. Oncology evaluation, likely as an outpatient Follow-up with Dr. Barry Dienes as scheduled. Okay to discharge in a.m.if postop course remains uneventful.  Ronnette Juniper, M.D.

## 2018-06-11 ENCOUNTER — Encounter (HOSPITAL_COMMUNITY): Payer: Self-pay | Admitting: Gastroenterology

## 2018-06-11 DIAGNOSIS — K8689 Other specified diseases of pancreas: Secondary | ICD-10-CM

## 2018-06-11 LAB — COMPREHENSIVE METABOLIC PANEL
ALT: 599 U/L — AB (ref 0–44)
AST: 197 U/L — AB (ref 15–41)
Albumin: 3 g/dL — ABNORMAL LOW (ref 3.5–5.0)
Alkaline Phosphatase: 218 U/L — ABNORMAL HIGH (ref 38–126)
Anion gap: 5 (ref 5–15)
BUN: 7 mg/dL (ref 6–20)
CHLORIDE: 106 mmol/L (ref 98–111)
CO2: 27 mmol/L (ref 22–32)
CREATININE: 0.77 mg/dL (ref 0.61–1.24)
Calcium: 8.5 mg/dL — ABNORMAL LOW (ref 8.9–10.3)
Glucose, Bld: 115 mg/dL — ABNORMAL HIGH (ref 70–99)
POTASSIUM: 3.7 mmol/L (ref 3.5–5.1)
SODIUM: 138 mmol/L (ref 135–145)
Total Bilirubin: 4.4 mg/dL — ABNORMAL HIGH (ref 0.3–1.2)
Total Protein: 5.7 g/dL — ABNORMAL LOW (ref 6.5–8.1)

## 2018-06-11 LAB — CBC
HCT: 40.5 % (ref 39.0–52.0)
Hemoglobin: 13.1 g/dL (ref 13.0–17.0)
MCH: 29.9 pg (ref 26.0–34.0)
MCHC: 32.3 g/dL (ref 30.0–36.0)
MCV: 92.5 fL (ref 80.0–100.0)
PLATELETS: 155 10*3/uL (ref 150–400)
RBC: 4.38 MIL/uL (ref 4.22–5.81)
RDW: 13.7 % (ref 11.5–15.5)
WBC: 8.9 10*3/uL (ref 4.0–10.5)
nRBC: 0 % (ref 0.0–0.2)

## 2018-06-11 MED ORDER — INFLUENZA VAC SPLIT QUAD 0.5 ML IM SUSY
0.5000 mL | PREFILLED_SYRINGE | INTRAMUSCULAR | Status: AC | PRN
  Administered 2018-06-11: 0.5 mL via INTRAMUSCULAR
  Filled 2018-06-11: qty 0.5

## 2018-06-11 MED ORDER — LACTATED RINGERS IV BOLUS
1000.0000 mL | Freq: Once | INTRAVENOUS | Status: AC
  Administered 2018-06-11: 1000 mL via INTRAVENOUS

## 2018-06-11 MED ORDER — PANTOPRAZOLE SODIUM 40 MG PO TBEC
40.0000 mg | DELAYED_RELEASE_TABLET | Freq: Every day | ORAL | Status: DC
  Administered 2018-06-11: 40 mg via ORAL
  Filled 2018-06-11: qty 1

## 2018-06-11 MED ORDER — NICOTINE 7 MG/24HR TD PT24: 7.0000 mg | MEDICATED_PATCH | Freq: Every day | TRANSDERMAL | 0 refills | Status: DC

## 2018-06-11 MED ORDER — SIMETHICONE 80 MG PO CHEW
80.0000 mg | CHEWABLE_TABLET | Freq: Once | ORAL | Status: AC
  Administered 2018-06-11: 80 mg via ORAL
  Filled 2018-06-11: qty 1

## 2018-06-11 MED ORDER — THIAMINE HCL 100 MG PO TABS: 100.0000 mg | ORAL_TABLET | Freq: Every day | ORAL | 0 refills | Status: AC

## 2018-06-11 MED ORDER — ADULT MULTIVITAMIN W/MINERALS CH: 1.0000 | ORAL_TABLET | Freq: Every day | ORAL | 0 refills | Status: DC

## 2018-06-11 MED ORDER — FOLIC ACID 1 MG PO TABS: 1.0000 mg | ORAL_TABLET | Freq: Every day | ORAL | 0 refills | Status: DC

## 2018-06-11 NOTE — Discharge Instructions (Signed)
1. Follow-up with Dr. Barry Dienes as scheduled at 11 AM on 11/22. 2. Follow-up with the primary care physician (Dr. [name]) for hospital follow-up to review labs and to make referral for oncology in [place].  3. 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 4. Please abstain from smoking, consumption use and Tylenol.  It was a pleasure to treat you here in the hospital and I wish you the best of [place] in the future! - Milus Banister, [place], PGY-1 06/11/2018 11:51 AM

## 2018-06-11 NOTE — Progress Notes (Addendum)
Subjective: The patient was seen and examined at bedside. He reports upper abdominal discomfort, bloating, sensation of excessive gas and mild pain in epigastrium radiating to right upper quadrant and to his back. He was able to tolerate solid meals without associated nausea or vomiting and has been passing flatus.  Objective: Vital signs in last 24 hours: Temp:  [97.4 F (36.3 C)-98.7 F (37.1 C)] 97.4 F (36.3 C) (11/06 0807) Pulse Rate:  [53-79] 57 (11/06 0807) Resp:  [15-20] 18 (11/06 0807) BP: (118-146)/(78-92) 146/86 (11/06 0807) SpO2:  [95 %-98 %] 98 % (11/06 0807) Weight:  [88.4 kg] 88.4 kg (11/05 1252) Weight change: 0 kg Last BM Date: 06/10/18  UX:NATFTDDUK GENERAL:nontoxic, not in distress ABDOMEN:soft, nondistended, nontender, sluggish bowel sounds EXTREMITIES:no deformity  Lab Results: Results for orders placed or performed during the hospital encounter of 06/07/18 (from the past 48 hour(s))  Comprehensive metabolic panel     Status: Abnormal   Collection Time: 06/10/18  5:39 AM  Result Value Ref Range   Sodium 137 135 - 145 mmol/L   Potassium 4.0 3.5 - 5.1 mmol/L   Chloride 107 98 - 111 mmol/L   CO2 25 22 - 32 mmol/L   Glucose, Bld 110 (H) 70 - 99 mg/dL   BUN 11 6 - 20 mg/dL   Creatinine, Ser 0.74 0.61 - 1.24 mg/dL   Calcium 8.5 (L) 8.9 - 10.3 mg/dL   Total Protein 5.5 (L) 6.5 - 8.1 g/dL   Albumin 2.9 (L) 3.5 - 5.0 g/dL   AST 207 (H) 15 - 41 U/L   ALT 561 (H) 0 - 44 U/L   Alkaline Phosphatase 211 (H) 38 - 126 U/L   Total Bilirubin 7.8 (H) 0.3 - 1.2 mg/dL   GFR calc non Af Amer >60 >60 mL/min   GFR calc Af Amer >60 >60 mL/min    Comment: (NOTE) The eGFR has been calculated using the CKD EPI equation. This calculation has not been validated in all clinical situations. eGFR's persistently <60 mL/min signify possible Chronic Kidney Disease.    Anion gap 5 5 - 15    Comment: Performed at [facility] [address], [place] [address]  CBC      Status: None   Collection Time: 06/11/18  5:49 AM  Result Value Ref Range   WBC 8.9 4.0 - 10.5 K/uL   RBC 4.38 4.22 - 5.81 MIL/uL   Hemoglobin 13.1 13.0 - 17.0 g/dL   HCT 40.5 39.0 - 52.0 %   MCV 92.5 80.0 - 100.0 fL   MCH 29.9 26.0 - 34.0 pg   MCHC 32.3 30.0 - 36.0 g/dL   RDW 13.7 11.5 - 15.5 %   Platelets 155 150 - 400 K/uL   nRBC 0.0 0.0 - 0.2 %    Comment: Performed at [facility], [place] [address] [place], [place] [address]  Comprehensive metabolic panel     Status: Abnormal   Collection Time: 06/11/18  5:49 AM  Result Value Ref Range   Sodium 138 135 - 145 mmol/L   Potassium 3.7 3.5 - 5.1 mmol/L   Chloride 106 98 - 111 mmol/L   CO2 27 22 - 32 mmol/L   Glucose, Bld 115 (H) 70 - 99 mg/dL   BUN 7 6 - 20 mg/dL   Creatinine, Ser 0.77 0.61 - 1.24 mg/dL   Calcium 8.5 (L) 8.9 - 10.3 mg/dL   Total Protein 5.7 (L) 6.5 - 8.1 g/dL   Albumin  3.0 (L) 3.5 - 5.0 g/dL   AST 197 (H) 15 - 41 U/L   ALT 599 (H) 0 - 44 U/L   Alkaline Phosphatase 218 (H) 38 - 126 U/L   Total Bilirubin 4.4 (H) 0.3 - 1.2 mg/dL   GFR calc non Af Amer >60 >60 mL/min   GFR calc Af Amer >60 >60 mL/min    Comment: (NOTE) The eGFR has been calculated using the CKD EPI equation. This calculation has not been validated in all clinical situations. eGFR's persistently <60 mL/min signify possible Chronic Kidney Disease.    Anion gap 5 5 - 15    Comment: Performed at [facility] [address] [place], [place] [address]    Studies/Results: Dg Ercp Biliary & Pancreatic Ducts  Result Date: 06/10/2018 CLINICAL DATA:  57 year old male with suspected malignant obstructed jaundice. EXAM: ERCP TECHNIQUE: Multiple spot images obtained with the fluoroscopic device and submitted for interpretation post-procedure. FLUOROSCOPY TIME:  Fluoroscopy Time:  6 minutes 33 seconds reported COMPARISON:  MRCP 06/09/2018 FINDINGS: A total of 6 saved intra procedural images are submitted for review. The images demonstrate a  flexible endoscope in the descending duodenum with wire cannulation of the main pancreatic duct followed by wire cannulation of the right intrahepatic ducts. Subsequent balloon occluded cholangiogram demonstrates dilation of the intrahepatic, proximal and mid common bile duct. There is a high-grade stricture at the junction of the mid and distal common bile ducts. The final images document placement of a metallic biliary stent. IMPRESSION: 1. 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 2. Placement of a metallic biliary stent. These images were submitted for radiologic interpretation only. Please see the procedural report for the amount of contrast and the fluoroscopy time utilized. Electronically Signed   By: Jacqulynn Cadet M.D.   On: 06/10/2018 15:43    Medications: I have reviewed the patient's current medications.  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  During CBD cannulation, the pancreatic duct was cannulated several times but never injected, there is possibility of mild postoperative pancreatitis as patient complains of upper abdominal discomfort.He was given 1 dose of indomethacin suppository postprocedure.  Total bilirubin has trended down from 7.4-4.4, AST/ALT/ALP still elevated at 197/599/218    Plan: Ringer's lactate 1000 mL IV bolus 1 dose. PPI once daily, patient was given IV Solu-Medrol 1 dose prior to ERCP as he complained of allergic reaction with dye and had also received prednisone prior to getting imaging with IV contrast.  Okay to discharge patient from GI standpoint today, if there is improvement in abdominal pain. I rediscussed findings of EUS, FNA, ERCP and stent placement  with the patient and his wife at bedside. They seem to understand.  Patient needs oncology follow-up and follow-up with Dr. Dellie Burns on discharge.    Ronnette Juniper 06/11/2018, 9:41 AM   Pager [phone] If no answer or after 5 PM call [phone]

## 2018-06-11 NOTE — Discharge Summary (Signed)
[facility] Discharge Summary  Patient name: Ernest Wu Medical record number: [id number] Date of birth: 1961/03/14 Age: 57 y.o. Gender: male Date of Admission: 06/07/2018  Date of Discharge: 06/11/2018 Admitting Physician: Kinnie Feil, MD  Primary Care Provider: System, Pcp Not In Consultants: GI, surgery  Indication for Hospitalization: Pain, changes in urine and stool color  Discharge Diagnoses/Problem List:  Pancreatic tumor Tobacco use disorder  Disposition: Discharge home  Discharge Condition: Stable  Discharge Exam:  Physical Exam  Constitutional: He appears well-developed and well-nourished.  HENT:  Head: Normocephalic.  Eyes: EOM are normal.  Cardiovascular: Normal rate and regular rhythm.  Pulmonary/Chest: Effort normal.  Abdominal: Normal appearance.  Mild tenderness to palpation of RUQ  Neurological: He is alert.  Skin: Skin is warm and dry. Capillary refill takes less than 2 seconds.  Psychiatric: He has a normal mood and affect.   Brief Hospital Course:  Ernest Wu was admitted on 06/07/2018 with abdominal pain, 10 pound weight loss, and color changes to his urine and stool.  CT abdomen showed common bile duct obstruction due to pancreatic mass with involvement of the portal venous confluence.  Gastroenterology was consulted and recommended further imaging with MRI and MRCP, which showed 2.3 cm diameter hypoenhancing mass at the junction of the head and neck of the pancreas with pancreatic ductal dilatation, suggested to be adenocarcinoma.  Surgery was consulted to weigh in on potential for future resection.  On 11/5 ERCP was conducted, biopsy was taken, metallic biliary stent was placed.  Throughout hospital admission the patient's vitals and other labs were monitored and the patient remained stable.  On day 1 status post stent placement the patient's bilirubin largely decreased and patient was declared medically stable for  discharge on 06/11/2017 with close outpatient follow-up.  Issues for Follow Up:  1. Follow-up with Dr. Barry Dienes as scheduled at 11 AM on 11/22. 2. Follow-up with the primary care physician for hospital follow-up to review labs and to make referral for oncology in [place], per patient's request.  3. Follow-up with oncology evaluation outpatient for further management of pancreatic mass, pending biopsy results. 4. Encourage smoking cessation and abstinence from alcohol and Tylenol.  Significant Procedures:  11/4 - MRI, MRCP 11/5 - EUS, Biopsy, Stent placement  Significant Labs and Imaging:  Recent Labs  Lab 06/07/18 1604 06/08/18 0550 06/11/18 0549  WBC 5.0 3.5* 8.9  HGB 14.6 13.5 13.1  HCT 44.5 41.6 40.5  PLT 174 161 155   Recent Labs  Lab 06/07/18 1604 06/08/18 0550 06/09/18 0605 06/10/18 0539 06/11/18 0549  NA 138 136 137 137 138  K 3.9 3.7 3.3* 4.0 3.7  CL 108 107 106 107 106  CO2 22 21* 25 25 27   GLUCOSE 115* 182* 150* 110* 115*  BUN 9 7 <5* 11 7  CREATININE 0.77 0.76 0.71 0.74 0.77  CALCIUM 9.3 8.8* 8.6* 8.5* 8.5*  ALKPHOS 306* 274* 242* 211* 218*  AST 232* 277* 222* 207* 197*  ALT 541* 569* 584* 561* 599*  ALBUMIN 3.7 3.2* 2.9* 2.9* 3.0*   CA 19-9: 105 HIV: Nonreactive  Results/Tests Pending at Time of Discharge: Surgical pathology 11/5  Discharge Medications:  Allergies as of 06/11/2018      Reactions   Iohexol Anaphylaxis, Itching, Rash   Contrast Media [iodinated Diagnostic Agents]    Burning sensation      Medication List    TAKE these medications   folic acid 1 MG tablet Commonly known as:  FOLVITE Take 1 tablet (1 mg total) by mouth daily. Start taking on:  06/12/2018   ibuprofen 200 MG tablet Commonly known as:  ADVIL,MOTRIN Take 800 mg by mouth every 6 (six) hours as needed (for pain or headaches).   multivitamin with minerals Tabs tablet Take 1 tablet by mouth daily. Start taking on:  06/12/2018   nicotine 7 mg/24hr patch Commonly  known as:  NICODERM CQ - dosed in mg/24 hr Place 1 patch (7 mg total) onto the skin daily. Start taking on:  06/12/2018   omeprazole 20 MG tablet Commonly known as:  PRILOSEC OTC Take 20 mg by mouth daily as needed (for heartburn).   thiamine 100 MG tablet Take 1 tablet (100 mg total) by mouth daily. Start taking on:  06/12/2018       Discharge Instructions: Please refer to Patient Instructions section of EMR for full details.  Patient was counseled important signs and symptoms that should prompt return to medical care, changes in medications, dietary instructions, activity restrictions, and follow up appointments.   Follow-Up Appointments: Follow-up Information    Stark Klein, MD Follow up on 06/27/2018.   Specialty:  General Surgery Why:  11:30am, arrive by 11:00am for check in and paperwork Contact information: [place] [id number] [phone]        [address], [name], MD. Schedule an appointment as soon as possible for a visit in 1 week(s).   Specialty:  Family Medicine Why:  Please make a hospital follow up appointment within the first week after you are discharged from the hospital. Contact information: [place] [address] [address], [place], DO 06/11/2018, 11:54 AM PGY-1, [place]

## 2018-06-11 NOTE — Progress Notes (Signed)
Family Medicine Teaching Service Daily Progress Note Intern Pager: [phone]  Patient name: Ernest Wu Medical record number: [id number] Date of birth: [birth year] Age: 57 y.o. Gender: male  Primary Care Provider: System, Pcp Not In Consultants:GI, Surgery  Code Status:full  Pt Overview and Major Events to Date: 11/2 - Admitted, pancreatic mass visualized on CT 11/4 - MRI, MRCP 11/5 - EUS, Biopsy, Stent placement  Assessment and Plan:  Ernest Wu a 57 y.o.malepresenting with jaundice.History of tobacco use disorder.  Pancreatic mass: Patient currently day 1 s/p ERCP with Biopsy and stent placement. Likely pancreatic adenocarcinoma visualized on CT abdomen/pelvis with contrast on admission. Patient has had weight loss and jaundice, likely due to obstruction of common bile duct from mass. Mixed direct and indirect hyperbilirubinemia also indicative of obstructive process.MRCP (11/4) showsmass (most likelyadenocarcinoma)of Pancreatic head and neck with common bile duct obstruction. T bili down from 7.8 to 4.4 on 11/6. -GI consult: Advance diet as tolerated. Liver enzymes in a.m., Oncology evaluation, likely as an outpatient; Follow-up with Dr. Barry Dienes as scheduled; Okay to discharge in a.m.if postop course remains uneventful. - appreciate recs -dailyCBC, CMP -vitals per routine  -ibuprofen PRN, avoid tylenol  Tobacco use:1ppd tobacco use since age 52  -nicotine patch -encourage cessation   Alcohol use, resolved:Drinks 4-6 beers/day.GGT 1038 on admission, indicating recent alcohol use.CIWAscores have been 0. -CIWA, continue to monitor -thiamine -folic acid  -multivitamin  FEN/GI:clears XBM:[id number]  Disposition:continue on med-surg  Subjective:  Patient seen this morning sitting upright in bed eating breakfast.  He states he feels bloated, but otherwise denies nausea, vomiting, and abdominal pain.  He feels he is ready to go home and is asking  about follow-up appointments with surgery and oncology.  He was informed he will have to follow-up with his primary care physician, Dr. Venetia Wu, to then be referred for oncology in [place].  Objective: Temp:  [97.5 F (36.4 C)-98.7 F (37.1 C)] 98.1 F (36.7 C) (11/06 0429) Pulse Rate:  [53-79] 53 (11/06 0429) Resp:  [15-20] 16 (11/06 0429) BP: (118-145)/(78-92) 140/89 (11/06 0429) SpO2:  [95 %-98 %] 98 % (11/06 0429) Weight:  [88.4 kg] 88.4 kg (11/05 1252)  Physical Exam  Constitutional: He appears well-developed and well-nourished.  HENT:  Head: Normocephalic.  Eyes: EOM are normal.  Cardiovascular: Normal rate and regular rhythm.  Pulmonary/Chest: Effort normal.  Abdominal: Normal appearance.  Mild tenderness to palpation of RUQ  Neurological: He is alert.  Skin: Skin is warm and dry. Capillary refill takes less than 2 seconds.  Psychiatric: He has a normal mood and affect.   Laboratory: Recent Labs  Lab 06/07/18 1604 06/08/18 0550 06/11/18 0549  WBC 5.0 3.5* 8.9  HGB 14.6 13.5 13.1  HCT 44.5 41.6 40.5  PLT 174 161 155   Recent Labs  Lab 06/09/18 0605 06/10/18 0539 06/11/18 0549  NA 137 137 138  K 3.3* 4.0 3.7  CL 106 107 106  CO2 25 25 27   BUN <5* 11 7  CREATININE 0.71 0.74 0.77  CALCIUM 8.6* 8.5* 8.5*  PROT 5.7* 5.5* 5.7*  BILITOT 6.3* 7.8* 4.4*  ALKPHOS 242* 211* 218*  ALT 584* 561* 599*  AST 222* 207* 197*  GLUCOSE 150* 110* 115*   CA 19-9: elevated at 105 (rr 0-35)  HIV: non-reactive  Imaging/Diagnostic Tests: Ct Abdomen Pelvis W Contrast  Result Date: 06/07/2018 CLINICAL DATA:  Oranged colored urine.  Jaundice EXAM: CT ABDOMEN AND PELVIS WITH CONTRAST TECHNIQUE: Multidetector CT imaging of the abdomen and pelvis  was performed using the standard protocol following bolus administration of intravenous contrast. CONTRAST:  159mL OMNIPAQUE IOHEXOL 300 MG/ML  SOLN COMPARISON:  None FINDINGS: Lower chest: No acute abnormality. Hepatobiliary: Small  intermediate attenuating structure within segment 4a measures 8 mm, image 15/3. Indeterminate. There is mild intrahepatic bile duct dilatation. Moderate distension of the gallbladder. The common bile duct is increased in caliber measuring 1.6 cm. The dilated CBD terminates abruptly at the level of the head of pancreas, image 45/6. Pancreas: There is a mass centered around the neck of pancreas which measures 3.5 by 2.3 by 2.1 cm. Atrophy of the body and tail of pancreas with increase caliber of the main duct is noted. Encasement of the portal venous confluence is suspected. The main portal vein and the splenic vein remain patent. The celiac trunk and SMA appear uninvolved. Spleen: Normal in size without focal abnormality. Adrenals/Urinary Tract: The adrenal glands appear normal. Small hypodensity within the posterior cortex of the right kidney measures 6 mm, image 45/3. Too small to characterize. No hydronephrosis. Urinary bladder appears normal. Stomach/Bowel: The stomach and the small bowel loops are unremarkable. The appendix is visualized and appears normal. Unremarkable appearance of the colon. Vascular/Lymphatic: Aortic atherosclerosis without aneurysm. No adenopathy. Reproductive: Prostate is unremarkable. Other: No ascites.  No peritoneal nodule or mass Musculoskeletal: No acute or significant osseous findings. IMPRESSION: 1. There is a low attenuation mass centered around the neck of pancreas which is concerning for neoplasm. Pancreatic adenocarcinoma favored. This results in common bile duct obstruction with mild intrahepatic biliary ductal dilatation. There also is involvement of the portal venous confluence. Further evaluation with nonemergent contrast enhanced MRI of the pancreas is recommended. 2. Small indeterminate low-attenuation structure is noted within segment 4 a of the liver. This could be better addressed at MRI. Electronically Signed   By: Kerby Moors M.D.   On: 06/07/2018 23:37   Dg Ercp  Biliary & Pancreatic Ducts  Result Date: 06/10/2018 CLINICAL DATA:  57 year old male with suspected malignant obstructed jaundice. EXAM: ERCP TECHNIQUE: Multiple spot images obtained with the fluoroscopic device and submitted for interpretation post-procedure. FLUOROSCOPY TIME:  Fluoroscopy Time:  6 minutes 33 seconds reported COMPARISON:  MRCP 06/09/2018 FINDINGS: A total of 6 saved intra procedural images are submitted for review. The images demonstrate a flexible endoscope in the descending duodenum with wire cannulation of the main pancreatic duct followed by wire cannulation of the right intrahepatic ducts. Subsequent balloon occluded cholangiogram demonstrates dilation of the intrahepatic, proximal and mid common bile duct. There is a high-grade stricture at the junction of the mid and distal common bile ducts. The final images document placement of a metallic biliary stent. IMPRESSION: 1. 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 2. Placement of a metallic biliary stent. These images were submitted for radiologic interpretation only. Please see the procedural report for the amount of contrast and the fluoroscopy time utilized. Electronically Signed   By: Jacqulynn Cadet M.D.   On: 06/10/2018 15:43   Mr Abdomen Mrcp Moise Boring Contast  Addendum Date: 06/09/2018   ADDENDUM REPORT: 06/09/2018 09:47 ADDENDUM: Request made by Dr. Ronald Lobo GI service to evaluate for vascular involvement by the pancreatic mass. The pancreatic mass involves approximately 40 percent of the main portal vein circumference at the portal splenic venous confluence, with associated mild narrowing of the portal splenic venous confluence. The SMV, splenic vein and main, right and left portal veins remain patent. The celiac  trunk and SMA are not involved by the pancreatic mass. These addended results were called by telephone at the time of interpretation on 06/09/2018 at 9:45 am to Dr.  Cristina Gong, who verbally acknowledged these results. Electronically Signed   By: Ilona Sorrel M.D.   On: 06/09/2018 09:47   Result Date: 06/09/2018 CLINICAL DATA:  Jaundice and weight loss. Pancreatic mass seen on CT. EXAM: MRI ABDOMEN WITHOUT AND WITH CONTRAST (INCLUDING MRCP) TECHNIQUE: Multiplanar multisequence MR imaging of the abdomen was performed both before and after the administration of intravenous contrast. Heavily T2-weighted images of the biliary and pancreatic ducts were obtained, and three-dimensional MRCP images were rendered by post processing. CONTRAST:  8 mL Gadavist COMPARISON:  CT abdomen and pelvis 06/07/2018 FINDINGS: Motion artifact limits examination. Lower chest: Atelectasis in the lung bases. Hepatobiliary: Intra and extrahepatic bile duct dilatation with abrupt change in caliber of the mid common bile duct suggesting an obstructing mass or stricture. Gallbladder is distended without wall thickening or edema. No stones identified. 9 mm focal lesion in segment 4 appears to enhance in the periphery. This could represent a small hemangioma. Metastasis less likely. Pancreas: Hypoenhancing mass at the junction of the head and neck of the pancreas measuring 2.3 cm diameter. Pancreatic ductal dilatation. Likely adenocarcinoma. No significant infiltration or vascular involvement. Spleen:  Within normal limits in size and appearance. Adrenals/Urinary Tract: No masses identified. No evidence of hydronephrosis. Stomach/Bowel: Visualized portions within the abdomen are unremarkable. Vascular/Lymphatic: No pathologically enlarged lymph nodes identified. No abdominal aortic aneurysm demonstrated. Other:  None. Musculoskeletal: No focal bone lesions identified. IMPRESSION: 2.3 cm diameter hypoenhancing mass at the junction of the head and neck of pancreas with pancreatic ductal dilatation, likely adenocarcinoma. Intra and extrahepatic bile duct dilatation with abrupt change in caliber at the mid common  bile duct. This could indicate an obstructing mass or stricture. Electronically Signed: By: Lucienne Capers M.D. On: 06/09/2018 05:15    Daisy Floro, DO 06/11/2018, 7:54 AM PGY-1, [facility] Intern pager: [phone], text pages welcome

## 2018-06-27 ENCOUNTER — Other Ambulatory Visit: Payer: Self-pay | Admitting: Hematology

## 2018-06-27 ENCOUNTER — Encounter: Payer: Self-pay | Admitting: Nurse Practitioner

## 2018-06-27 ENCOUNTER — Inpatient Hospital Stay (HOSPITAL_BASED_OUTPATIENT_CLINIC_OR_DEPARTMENT_OTHER): Payer: Managed Care, Other (non HMO) | Admitting: Nurse Practitioner

## 2018-06-27 ENCOUNTER — Inpatient Hospital Stay: Payer: Managed Care, Other (non HMO) | Attending: Hematology

## 2018-06-27 ENCOUNTER — Other Ambulatory Visit: Payer: Self-pay | Admitting: General Surgery

## 2018-06-27 ENCOUNTER — Telehealth: Payer: Self-pay | Admitting: Nurse Practitioner

## 2018-06-27 VITALS — BP 129/86 | HR 68 | Temp 98.7°F | Resp 17 | Ht 72.0 in | Wt 192.0 lb

## 2018-06-27 DIAGNOSIS — Z5111 Encounter for antineoplastic chemotherapy: Secondary | ICD-10-CM | POA: Insufficient documentation

## 2018-06-27 DIAGNOSIS — Z803 Family history of malignant neoplasm of breast: Secondary | ICD-10-CM

## 2018-06-27 DIAGNOSIS — R109 Unspecified abdominal pain: Secondary | ICD-10-CM | POA: Diagnosis not present

## 2018-06-27 DIAGNOSIS — Z800 Family history of malignant neoplasm of digestive organs: Secondary | ICD-10-CM | POA: Diagnosis not present

## 2018-06-27 DIAGNOSIS — C259 Malignant neoplasm of pancreas, unspecified: Secondary | ICD-10-CM

## 2018-06-27 DIAGNOSIS — R5383 Other fatigue: Secondary | ICD-10-CM | POA: Diagnosis not present

## 2018-06-27 DIAGNOSIS — C257 Malignant neoplasm of other parts of pancreas: Secondary | ICD-10-CM | POA: Diagnosis present

## 2018-06-27 DIAGNOSIS — R140 Abdominal distension (gaseous): Secondary | ICD-10-CM

## 2018-06-27 DIAGNOSIS — Z79899 Other long term (current) drug therapy: Secondary | ICD-10-CM | POA: Diagnosis not present

## 2018-06-27 DIAGNOSIS — F17210 Nicotine dependence, cigarettes, uncomplicated: Secondary | ICD-10-CM

## 2018-06-27 DIAGNOSIS — Z807 Family history of other malignant neoplasms of lymphoid, hematopoietic and related tissues: Secondary | ICD-10-CM | POA: Insufficient documentation

## 2018-06-27 DIAGNOSIS — Z791 Long term (current) use of non-steroidal anti-inflammatories (NSAID): Secondary | ICD-10-CM | POA: Diagnosis not present

## 2018-06-27 DIAGNOSIS — D490 Neoplasm of unspecified behavior of digestive system: Secondary | ICD-10-CM

## 2018-06-27 LAB — CMP (CANCER CENTER ONLY)
ALT: 56 U/L — AB (ref 0–44)
ANION GAP: 10 (ref 5–15)
AST: 22 U/L (ref 15–41)
Albumin: 3.9 g/dL (ref 3.5–5.0)
Alkaline Phosphatase: 125 U/L (ref 38–126)
BUN: 6 mg/dL (ref 6–20)
CHLORIDE: 106 mmol/L (ref 98–111)
CO2: 26 mmol/L (ref 22–32)
CREATININE: 0.82 mg/dL (ref 0.61–1.24)
Calcium: 9.7 mg/dL (ref 8.9–10.3)
Glucose, Bld: 101 mg/dL — ABNORMAL HIGH (ref 70–99)
POTASSIUM: 4.2 mmol/L (ref 3.5–5.1)
SODIUM: 142 mmol/L (ref 135–145)
Total Bilirubin: 1.5 mg/dL — ABNORMAL HIGH (ref 0.3–1.2)
Total Protein: 7 g/dL (ref 6.5–8.1)

## 2018-06-27 LAB — CBC WITH DIFFERENTIAL (CANCER CENTER ONLY)
Abs Immature Granulocytes: 0 10*3/uL (ref 0.00–0.07)
Basophils Absolute: 0.1 10*3/uL (ref 0.0–0.1)
Basophils Relative: 1 %
EOS PCT: 4 %
Eosinophils Absolute: 0.2 10*3/uL (ref 0.0–0.5)
HEMATOCRIT: 44.2 % (ref 39.0–52.0)
HEMOGLOBIN: 14.7 g/dL (ref 13.0–17.0)
Immature Granulocytes: 0 %
LYMPHS ABS: 1.4 10*3/uL (ref 0.7–4.0)
LYMPHS PCT: 26 %
MCH: 29.8 pg (ref 26.0–34.0)
MCHC: 33.3 g/dL (ref 30.0–36.0)
MCV: 89.5 fL (ref 80.0–100.0)
Monocytes Absolute: 0.5 10*3/uL (ref 0.1–1.0)
Monocytes Relative: 9 %
NEUTROS ABS: 3.2 10*3/uL (ref 1.7–7.7)
NRBC: 0 % (ref 0.0–0.2)
Neutrophils Relative %: 60 %
Platelet Count: 189 10*3/uL (ref 150–400)
RBC: 4.94 MIL/uL (ref 4.22–5.81)
RDW: 12 % (ref 11.5–15.5)
WBC: 5.3 10*3/uL (ref 4.0–10.5)

## 2018-06-27 MED ORDER — DIPHENOXYLATE-ATROPINE 2.5-0.025 MG PO TABS: 2.0000 | ORAL_TABLET | Freq: Four times a day (QID) | ORAL | 0 refills | Status: DC | PRN

## 2018-06-27 NOTE — Progress Notes (Addendum)
[place]  [phone] Fax:([phone]) [place] Note   Patient Care Team: Street, [name], MD as PCP - General (Family Medicine) 06/27/2018  CHIEF COMPLAINTS/PURPOSE OF CONSULTATION:  Pancreas cancer; consult requested by Dr. Barry Dienes   SUMMARY OF ONCOLOGY HISTORY    Adenocarcinoma of pancreas (Ernest Wu)   06/07/2018 Imaging    CT AP w Contrast IMPRESSION: 1. There is a low attenuation mass centered around the neck of pancreas which is concerning for neoplasm. Pancreatic adenocarcinoma favored. This results in common bile duct obstruction with mild intrahepatic biliary ductal dilatation. There also is involvement of the portal venous confluence. Further evaluation with nonemergent contrast enhanced MRI of the pancreas is recommended. 2. Small indeterminate low-attenuation structure is noted within segment 4 a of the liver. This could be better addressed at MRI.    06/09/2018 Imaging    MR ABD MRCP  The pancreatic mass involves approximately 40 percent of the main portal vein circumference at the portal splenic venous confluence, with associated mild narrowing of the portal splenic venous confluence. The SMV, splenic vein and main, right and left portal veins remain patent. The celiac trunk and SMA are not involved by the pancreatic mass.  IMPRESSION: 2.3 cm diameter hypoenhancing mass at the junction of the head and neck of pancreas with pancreatic ductal dilatation, likely adenocarcinoma.  Intra and extrahepatic bile duct dilatation with abrupt change in caliber at the mid common bile duct. This could indicate an obstructing mass or stricture.     06/10/2018 Initial Biopsy    Diagnosis PANCREAS, FINE NEEDLE ASPIRATION (SPECIMEN 1 OF 1 COLLECTED 06/10/18): MALIGNANT CELLS CONSISTENT WITH ADENOCARCINOMA.    06/10/2018 Procedure    IMPRESSION: 1. High-grade stricture in the common bile duct at the junction of the middle and distal  thirds. 2. Placement of a metallic biliary stent.    06/10/2018 Procedure    EUS per Dr. Paulita Fujita Impression:  - There was dilation in the common bile duct which measured up to 12 mm. - A mass was identified in the pancreatic head. This was staged T3 N1 Mx by endosonographic criteria. Fine needle aspiration performed. - A few lymph nodes were visualized and measured in the peripancreatic region. - There was no evidence of significant pathology in the left lobe of the liver.    06/27/2018 Initial Diagnosis    Adenocarcinoma of pancreas (Ernest Wu)     HISTORY OF PRESENTING ILLNESS:  Ernest Wu 57 y.o. male is here because of newly diagnosed pancreas cancer. He presented to ED on 11/2 with 2 week history of dark urine, and juandice. He had associated abdominal pain and bloating, loose stool, and fatigue. He notes 10 lbs weight loss. Bili was elevated to 10.7; he was admitted for hyperbilirubinemia and work up. He underwent CT AP with contrast and developed subsequent anaphylaxis to contrast media. Scan showed a mass centered around the neck of the pancreas measuring 3.5 x2.3 x2.1 cm with dilated CBD that terminates abruptly at the level of the pancreatic head. No evidence of adenopathy. MR ABD with MRCP was done for further characterization and assessment of vasculature. A small 9 mm focal lesion in the liver likely represents a small hemangioma. On MRCP the pancreatic mass involves approx 40 % of the main portal vein circumference at the poral splenic venous confluence. [facility] splenic vein and main, right, and left portal veins remain patent. The celiac trunk and SMA are uninvolved by tumor. On 11/5 he underwent EUS  biopsy per Dr. Paulita Fujita which identified the mass in the pancreatic head and showed a few lymph nodes in the peripancreatic region; staged as T3N1 by endosonographic criteria. He then underwent ERCP for high grade stricture in the CBD at the junction of the middle and distal thirds; a biliary  stent was placed. Pathology confirmed adenocarcinoma. He was discharged home on 11/6 with improving transaminitis and hyperbilirubinemia. He was seen by Dr. Barry Dienes in outpatient consult today and sent to med onc.   He has no significant past medical history. Socially he is married, has 5 healthy children. 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 He denies drug use. He smokes cigarettes, 1 PPD x30 years. Prior to his diagnosis he drank 4-5 beers per day. He lives in [place], he is independent of ADLs and drives himself. He has family history of cancer in [facility] with pancreas cancer, paternal uncle with lymphoma, and maternal aunt with breast.   Today, he feels well. His energy level has improved since hospital discharge. His appetite is normal. Juandice normalized. His urine and stool are normal color and caliber. He denies n/v. His abdominal pain is nearly resolved, no longer taking NSAIDs.    MEDICAL HISTORY:  History reviewed. No pertinent past medical history.  SURGICAL HISTORY: Past Surgical History:  Procedure Laterality Date  . BILIARY STENT PLACEMENT  06/10/2018   Procedure: BILIARY STENT PLACEMENT;  Surgeon: Ronnette Juniper, MD;  Location: [facility] ENDOSCOPY;  Service: Gastroenterology;;  . ERCP N/A 06/10/2018   Procedure: ENDOSCOPIC RETROGRADE CHOLANGIOPANCREATOGRAPHY (ERCP);  Surgeon: Ronnette Juniper, MD;  Location: [place];  Service: Gastroenterology;  Laterality: N/A;  . ESOPHAGOGASTRODUODENOSCOPY (EGD) WITH PROPOFOL N/A 06/10/2018   Procedure: ESOPHAGOGASTRODUODENOSCOPY (EGD) WITH PROPOFOL;  Surgeon: Ronnette Juniper, MD;  Location: [place];  Service: Gastroenterology;  Laterality: N/A;  . FINE NEEDLE ASPIRATION  06/10/2018   Procedure: FINE NEEDLE ASPIRATION (FNA) LINEAR;  Surgeon: Ronnette Juniper, MD;  Location: [facility] ENDOSCOPY;  Service: Gastroenterology;;  . Joan Mayans  06/10/2018   Procedure: Joan Mayans;  Surgeon: Ronnette Juniper, MD;  Location: [facility] ENDOSCOPY;  Service: Gastroenterology;;  . UPPER  ESOPHAGEAL ENDOSCOPIC ULTRASOUND (EUS) N/A 06/10/2018   Procedure: UPPER ESOPHAGEAL ENDOSCOPIC ULTRASOUND (EUS);  Surgeon: Ronnette Juniper, MD;  Location: [place];  Service: Gastroenterology;  Laterality: N/A;  . VASECTOMY      SOCIAL HISTORY: Social History   Socioeconomic History  . Marital status: Married    Spouse name: Not on file  . Number of children: 5  . Years of education: Not on file  . Highest education level: Not on file  Occupational History  . Not on file  Social Needs  . Financial resource strain: Not on file  . Food insecurity:    Worry: Not on file    Inability: Not on file  . Transportation needs:    Medical: Not on file    Non-medical: Not on file  Tobacco Use  . Smoking status: Current Every Day Smoker    Packs/day: 1.00    Years: 30.00    Pack years: 30.00  . Smokeless tobacco: Never Used  Substance and Sexual Activity  . Alcohol use: Yes    Alcohol/week: 15.0 standard drinks    Types: 15 Cans of beer per week    Comment: 4-5 beers per day before diagnosis   . Drug use: Never  . Sexual activity: Not on file  Lifestyle  . Physical activity:    Days per week: Not on file    Minutes per session: Not  on file  . Stress: Not on file  Relationships  . Social connections:    Talks on phone: Not on file    Gets together: Not on file    Attends religious service: Not on file    Active member of club or organization: Not on file    Attends meetings of clubs or organizations: Not on file    Relationship status: Not on file  . Intimate partner violence:    Fear of current or ex partner: Not on file    Emotionally abused: Not on file    Physically abused: Not on file    Forced sexual activity: Not on file  Other Topics Concern  . Not on file  Social History Narrative  . Not on file    FAMILY HISTORY: History reviewed. No pertinent family history.  ALLERGIES:  is allergic to iohexol and contrast media [iodinated diagnostic  agents].  MEDICATIONS:  Current Outpatient Medications  Medication Sig Dispense Refill  . esomeprazole (NEXIUM) 20 MG capsule Take 20 mg by mouth daily at 12 noon.    . folic acid (FOLVITE) 1 MG tablet Take 1 tablet (1 mg total) by mouth daily. 30 tablet 0  . thiamine 100 MG tablet Take 1 tablet (100 mg total) by mouth daily. 30 tablet 0  . diphenoxylate-atropine (LOMOTIL) 2.5-0.025 MG tablet Take 2 tablets by mouth 4 (four) times daily as needed for diarrhea or loose stools. 40 tablet 0  . ibuprofen (ADVIL,MOTRIN) 200 MG tablet Take 800 mg by mouth every 6 (six) hours as needed (for pain or headaches).    . Multiple Vitamin (MULTIVITAMIN WITH MINERALS) TABS tablet Take 1 tablet by mouth daily. 30 tablet 0   No current facility-administered medications for this visit.     REVIEW OF SYSTEMS:   Constitutional: Denies fevers, chills or abnormal night sweats (+) weight loss  Eyes: Denies blurriness of vision, double vision or watery eyes (+) juandice, improving  Ears, nose, mouth, throat, and face: Denies mucositis or sore throat Respiratory: Denies cough, dyspnea or wheezes Cardiovascular: Denies palpitation, chest discomfort or lower extremity swelling Gastrointestinal:  Denies nausea, vomiting, constipation, diarrhea, heartburn (+) loose stool prior to diagnosis, normal now (+) bloating, improved  Skin: Denies abnormal skin rashes Lymphatics: Denies new lymphadenopathy or easy bruising Neurological:Denies numbness, tingling or new weaknesses Behavioral/Psych: Mood is stable, no new changes  All other systems were reviewed with the patient and are negative.  PHYSICAL EXAMINATION: ECOG PERFORMANCE STATUS: 1 - Symptomatic but completely ambulatory  Vitals:   06/27/18 1344  BP: 129/86  Pulse: 68  Resp: 17  Temp: 98.7 F (37.1 C)  SpO2: 99%   Filed Weights   06/27/18 1344  Weight: 192 lb (87.1 kg)    GENERAL:alert, no distress and comfortable SKIN: no rashes or significant  lesions EYES: sclera anicteric  OROPHARYNX:no thrush or ulcers  LYMPH:  no palpable cervical or supraclavicular lymphadenopathy LUNGS: clear to auscultation with normal breathing effort HEART: regular rate & rhythm, no lower extremity edema ABDOMEN:abdomen soft, non-tender and normal bowel sounds Musculoskeletal: no cyanosis of digits and no clubbing  PSYCH: alert & oriented x 3 with fluent speech NEURO: no focal motor/sensory deficits  LABORATORY DATA:  I have reviewed the data as listed CBC Latest Ref Rng & Units 06/27/2018 06/11/2018 06/08/2018  WBC 4.0 - 10.5 K/uL 5.3 8.9 3.5(L)  Hemoglobin 13.0 - 17.0 g/dL 14.7 13.1 13.5  Hematocrit 39.0 - 52.0 % 44.2 40.5 41.6  Platelets 150 - 400  K/uL 189 155 161   CMP Latest Ref Rng & Units 06/27/2018 06/11/2018 06/10/2018  Glucose 70 - 99 mg/dL 101(H) 115(H) 110(H)  BUN 6 - 20 mg/dL 6 7 11   Creatinine 0.61 - 1.24 mg/dL 0.82 0.77 0.74  Sodium 135 - 145 mmol/L 142 138 137  Potassium 3.5 - 5.1 mmol/L 4.2 3.7 4.0  Chloride 98 - 111 mmol/L 106 106 107  CO2 22 - 32 mmol/L 26 27 25   Calcium 8.9 - 10.3 mg/dL 9.7 8.5(L) 8.5(L)  Total Protein 6.5 - 8.1 g/dL 7.0 5.7(L) 5.5(L)  Total Bilirubin 0.3 - 1.2 mg/dL 1.5(H) 4.4(H) 7.8(H)  Alkaline Phos 38 - 126 U/L 125 218(H) 211(H)  AST 15 - 41 U/L 22 197(H) 207(H)  ALT 0 - 44 U/L 56(H) 599(H) 561(H)    PATHOLOGY  Diagnosis 06/10/18 PANCREAS, FINE NEEDLE ASPIRATION (SPECIMEN 1 OF 1 COLLECTED 06/10/18): MALIGNANT CELLS CONSISTENT WITH ADENOCARCINOMA.  RADIOGRAPHIC STUDIES: I have personally reviewed the radiological images as listed and agreed with the findings in the report. Ct Abdomen Pelvis W Contrast  Result Date: 06/07/2018 CLINICAL DATA:  Oranged colored urine.  Jaundice EXAM: CT ABDOMEN AND PELVIS WITH CONTRAST TECHNIQUE: Multidetector CT imaging of the abdomen and pelvis was performed using the standard protocol following bolus administration of intravenous contrast. CONTRAST:  152mL OMNIPAQUE  IOHEXOL 300 MG/ML  SOLN COMPARISON:  None FINDINGS: Lower chest: No acute abnormality. Hepatobiliary: Small intermediate attenuating structure within segment 4a measures 8 mm, image 15/3. Indeterminate. There is mild intrahepatic bile duct dilatation. Moderate distension of the gallbladder. The common bile duct is increased in caliber measuring 1.6 cm. The dilated CBD terminates abruptly at the level of the head of pancreas, image 45/6. Pancreas: There is a mass centered around the neck of pancreas which measures 3.5 by 2.3 by 2.1 cm. Atrophy of the body and tail of pancreas with increase caliber of the main duct is noted. Encasement of the portal venous confluence is suspected. The main portal vein and the splenic vein remain patent. The celiac trunk and SMA appear uninvolved. Spleen: Normal in size without focal abnormality. Adrenals/Urinary Tract: The adrenal glands appear normal. Small hypodensity within the posterior cortex of the right kidney measures 6 mm, image 45/3. Too small to characterize. No hydronephrosis. Urinary bladder appears normal. Stomach/Bowel: The stomach and the small bowel loops are unremarkable. The appendix is visualized and appears normal. Unremarkable appearance of the colon. Vascular/Lymphatic: Aortic atherosclerosis without aneurysm. No adenopathy. Reproductive: Prostate is unremarkable. Other: No ascites.  No peritoneal nodule or mass Musculoskeletal: No acute or significant osseous findings. IMPRESSION: 1. There is a low attenuation mass centered around the neck of pancreas which is concerning for neoplasm. Pancreatic adenocarcinoma favored. This results in common bile duct obstruction with mild intrahepatic biliary ductal dilatation. There also is involvement of the portal venous confluence. Further evaluation with nonemergent contrast enhanced MRI of the pancreas is recommended. 2. Small indeterminate low-attenuation structure is noted within segment 4 a of the liver. This could be  better addressed at MRI. Electronically Signed   By: Kerby Moors M.D.   On: 06/07/2018 23:37   Dg Ercp Biliary & Pancreatic Ducts  Result Date: 06/10/2018 CLINICAL DATA:  57 year old male with suspected malignant obstructed jaundice. EXAM: ERCP TECHNIQUE: Multiple spot images obtained with the fluoroscopic device and submitted for interpretation post-procedure. FLUOROSCOPY TIME:  Fluoroscopy Time:  6 minutes 33 seconds reported COMPARISON:  MRCP 06/09/2018 FINDINGS: A total of 6 saved intra procedural images are submitted for review.  The images demonstrate a flexible endoscope in the descending duodenum with wire cannulation of the main pancreatic duct followed by wire cannulation of the right intrahepatic ducts. Subsequent balloon occluded cholangiogram demonstrates dilation of the intrahepatic, proximal and mid common bile duct. There is a high-grade stricture at the junction of the mid and distal common bile ducts. The final images document placement of a metallic biliary stent. IMPRESSION: 1. 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 2. Placement of a metallic biliary stent. These images were submitted for radiologic interpretation only. Please see the procedural report for the amount of contrast and the fluoroscopy time utilized. Electronically Signed   By: Jacqulynn Cadet M.D.   On: 06/10/2018 15:43   Mr Abdomen Mrcp Moise Boring Contast  Addendum Date: 06/09/2018   ADDENDUM REPORT: 06/09/2018 09:47 ADDENDUM: Request made by Dr. Ronald Lobo GI service to evaluate for vascular involvement by the pancreatic mass. The pancreatic mass involves approximately 40 percent of the main portal vein circumference at the portal splenic venous confluence, with associated mild narrowing of the portal splenic venous confluence. The SMV, splenic vein and main, right and left portal veins remain patent. The celiac trunk and SMA are not involved by the pancreatic mass.  These addended results were called by telephone at the time of interpretation on 06/09/2018 at 9:45 am to Dr. Cristina Gong, who verbally acknowledged these results. Electronically Signed   By: Ilona Sorrel M.D.   On: 06/09/2018 09:47   Result Date: 06/09/2018 CLINICAL DATA:  Jaundice and weight loss. Pancreatic mass seen on CT. EXAM: MRI ABDOMEN WITHOUT AND WITH CONTRAST (INCLUDING MRCP) TECHNIQUE: Multiplanar multisequence MR imaging of the abdomen was performed both before and after the administration of intravenous contrast. Heavily T2-weighted images of the biliary and pancreatic ducts were obtained, and three-dimensional MRCP images were rendered by post processing. CONTRAST:  8 mL Gadavist COMPARISON:  CT abdomen and pelvis 06/07/2018 FINDINGS: Motion artifact limits examination. Lower chest: Atelectasis in the lung bases. Hepatobiliary: Intra and extrahepatic bile duct dilatation with abrupt change in caliber of the mid common bile duct suggesting an obstructing mass or stricture. Gallbladder is distended without wall thickening or edema. No stones identified. 9 mm focal lesion in segment 4 appears to enhance in the periphery. This could represent a small hemangioma. Metastasis less likely. Pancreas: Hypoenhancing mass at the junction of the head and neck of the pancreas measuring 2.3 cm diameter. Pancreatic ductal dilatation. Likely adenocarcinoma. No significant infiltration or vascular involvement. Spleen:  Within normal limits in size and appearance. Adrenals/Urinary Tract: No masses identified. No evidence of hydronephrosis. Stomach/Bowel: Visualized portions within the abdomen are unremarkable. Vascular/Lymphatic: No pathologically enlarged lymph nodes identified. No abdominal aortic aneurysm demonstrated. Other:  None. Musculoskeletal: No focal bone lesions identified. IMPRESSION: 2.3 cm diameter hypoenhancing mass at the junction of the head and neck of pancreas with pancreatic ductal dilatation, likely  adenocarcinoma. Intra and extrahepatic bile duct dilatation with abrupt change in caliber at the mid common bile duct. This could indicate an obstructing mass or stricture. Electronically Signed: By: Lucienne Capers M.D. On: 06/09/2018 05:15    ASSESSMENT & PLAN: 57 year old male with no significant past medical history presented with juandice found to have pancreatic mass   1. 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 He has what appears to be localized pancreatic cancer. Will obtain  CT chest wo contrast to complete staging work up  -He saw Dr. Barry Dienes who considers him to be borderline resectable due to the vascular involvement; she favors neoadjuvant chemotherapy  -Dr. Burr Medico explained the aggressive nature of pancreatic adenocarcinoma and the need for different treatment modalities including chemotherapy, possibly radiation, and surgery. It was explained to the patient there is a high risk of recurrence even after surgery. However, the only way to potentially cure his cancer is surgical resection.  -Dr. Burr Medico explained the goal and benefit of neoadjuvant chemo is to shrink the tumor and help make surgery more successful; after a large surgery such as whipple, he is less likely to tolerate intensive chemo; she discussed gemcitabine and abraxane vs FOLFIRINOX  -The patient is young and fit, no significant comorbidities, he would likely tolerate more intensive chemotherapy well -Dr. Barry Dienes already discussed [facility], the patient agrees -Chemotherapy consent: Side effects including but not limited to fatigue, nausea, vomiting, diarrhea, hair thinning/loss, neuropathy, cold sensitivity, vasospasm, fluid retention, renal and kidney dysfunction, neutropenic fever, need for blood transfusion, bleeding, were discussed with patient in great detail. He agrees to proceed. -He will attend chemo education class.  -I  prescribed lomotil for anticipated diarrhea. He currently denies abdominal pain, no need for pain meds currently.  -Plan to begin neoadjuvant chemo with FOLFIRINOX q2 weeks for approximately 4 months, to start next week -will restage after 2 months with MRI, given his anaphylactic reaction to CT contrast. The role for neoadjuvant radiation will be determined after chemotherapy. -He lives in [place], [place] consider pump d/c with home care agency if he has difficulty with travel. -CBC normal. CA 19-9 pending  -Will refer to dietician  -F/u with cycle 2  2. Obstructive juandice s/p biliary stent in CBD  -He presented with juandice, bili 10.7  -transaminases and hyperbilirubinemia are improved; bili 1.5 today, AST 22 ALT 56  3. Smoking and alcohol cessation -I strongly encouraged him to quit drinking alcohol and smoking cigarettes, to help him better tolerate chemotherapy and eventually surgery. He agrees to try.   4. Genetics  -He has personal and family history of pancreas cancer. He qualifies for genetics. He has 5 children. He seemed interested but did not agree. Will revisit and refer if he agrees.   PLAN: -CT chest next week to complete work up -Chemo class, PAC placement next week    -Lab, flush, FOLFIRINOX q2 weeks, starting next week  -F/u with cycle 2 -dietician referral    Orders Placed This Encounter  Procedures  . CT Chest Wo Contrast    Standing Status:   Future    Standing Expiration Date:   06/27/2019    Order Specific Question:   Preferred imaging location?    Answer:   [facility]    Order Specific Question:   Radiology Contrast Protocol - do NOT remove file path    Answer:   [email or web address]  . Ambulatory referral to Nutrition and Diabetic E    Referral Priority:   Routine    Referral Type:   Consultation    Referral Reason:   Specialty Services Required    Number of Visits Requested:   1    All questions were answered. The  patient knows to call the clinic with any problems, questions or concerns.     Alla Feeling, NP 06/27/2018   Addendum  I have seen the patient, examined him. I agree with the assessment and and plan and have edited the notes.  I have reviewed Mr. Fabiano CAT scan in person, and discussed his biopsy results with patient and his wife. will complete staging with CT chest.  We discussed the nature of pancreatic adenocarcinoma, and high risk of recurrence after surgery.  Neoadjuvant or adjuvant chemotherapy is usually part of the treatment plan in addition to surgery.  Given his borderline resectable disease, I recommend neoadjuvant chemotherapy, which was also preferred by his surgeon Dr. Barry Dienes.  Patient is otherwise healthy and fit, with good performance status, I recommend FOLFIRINOX every 2 weeks for 2-4 months, and repeat staging scan after 2 months chemo.  Consent was obtained today.  We will repeat these lab, and arrange chemo class and chemo. All questions were answered.   [name]  06/27/2018

## 2018-06-27 NOTE — Telephone Encounter (Signed)
Printed calendar and avs. °

## 2018-06-27 NOTE — Progress Notes (Signed)
  Oncology Nurse Navigator Documentation    Met Ernest Wu, wife and adult son. Explained role of nurse navigator. Educational information from [employer] provided on pancreatic cancer.    Contact names and phone numbers were provided for entire [facility] team. Pot-A-Cath insturction provided. 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 Wife is concerned about finances.   I provided phone number for Ivin Poot LPN for assistance with wife's FMLA.  Will continue to follow and offer support. My contact information provided for questions or concerns.

## 2018-06-28 LAB — CANCER ANTIGEN 19-9: CAN 19-9: 20 U/mL (ref 0–35)

## 2018-06-29 ENCOUNTER — Other Ambulatory Visit: Payer: Self-pay | Admitting: Hematology

## 2018-06-29 ENCOUNTER — Telehealth: Payer: Self-pay | Admitting: Hematology

## 2018-06-29 DIAGNOSIS — C259 Malignant neoplasm of pancreas, unspecified: Secondary | ICD-10-CM

## 2018-06-29 MED ORDER — PROCHLORPERAZINE MALEATE 10 MG PO TABS: 10.0000 mg | ORAL_TABLET | Freq: Four times a day (QID) | ORAL | 1 refills | Status: DC | PRN

## 2018-06-29 MED ORDER — LIDOCAINE-PRILOCAINE 2.5-2.5 % EX CREA: TOPICAL_CREAM | CUTANEOUS | 3 refills | Status: DC

## 2018-06-29 MED ORDER — ONDANSETRON HCL 8 MG PO TABS: 8.0000 mg | ORAL_TABLET | Freq: Two times a day (BID) | ORAL | 1 refills | Status: DC | PRN

## 2018-06-29 NOTE — Progress Notes (Signed)
START ON PATHWAY REGIMEN - Pancreatic     A cycle is every 14 days:     Oxaliplatin      Leucovorin      Irinotecan      5-Fluorouracil      5-Fluorouracil   **Always confirm dose/schedule in your pharmacy ordering system**  Patient Characteristics: Adenocarcinoma, Borderline Resectable or High Risk, Potentially Resectable, Primary Neoadjuvant Therapy, PS = 0, 1 Histology: Adenocarcinoma Current evidence of distant metastases<= No AJCC T Category: T2 AJCC N Category: N0 AJCC M Category: M0 AJCC 8 Stage Grouping: IB Intent of Therapy: Curative Intent, Discussed with Patient 

## 2018-06-30 ENCOUNTER — Telehealth: Payer: Self-pay | Admitting: Nurse Practitioner

## 2018-06-30 ENCOUNTER — Telehealth: Payer: Self-pay | Admitting: *Deleted

## 2018-06-30 ENCOUNTER — Other Ambulatory Visit: Payer: Self-pay

## 2018-06-30 DIAGNOSIS — C259 Malignant neoplasm of pancreas, unspecified: Secondary | ICD-10-CM

## 2018-06-30 NOTE — H&P (Signed)
[name] Documented: 06/27/2018 11:50 AM Location: [facility] Patient #: [id number] DOB: 06-12-1961 Married / Language: English / Race: Refused to Report/Unreported Male   History of Present Illness Stark Klein MD; 06/27/2018 1:09 PM) The patient is a 57 year old male who presents for a follow-up for Pancreatic cancer. Patient is a 57 year old male that was referred to the acute care general surgery service at [facility] in early November when the patient presented with obstructive jaundice. The patient had significant epigastric and right upper quadrant pain. His bilirubin was found to be elevated and imaging showed dilated intra-and extrahepatic bile ducts. He underwent ERCP with stenting as well as an endoscopic ultrasound. Imaging and EUS demonstrated approximately a 2.5 cm pancreatic head mass with invasion into around 40% of the portal vein circumference and invasion at the porta splenic confluence.  The patient is feeling significantly better post stenting. His urine and stool color has returned to normal. He is no longer requiring ibuprofen for discomfort. He denies diarrhea or issues with blood sugar. He has been able to work. He does work in [employer]. He still feels a bit fatigued. He thinks his appetite has returned to normal and he is no longer losing weight. He had no personal history of cancer and no family history of cancer of which he is aware.   CT abd/pelvis 06/07/18 IMPRESSION: 1. There is a low attenuation mass centered around the neck of pancreas which is concerning for neoplasm. Pancreatic adenocarcinoma favored. This results in common bile duct obstruction with mild intrahepatic biliary ductal dilatation. There also is involvement of the portal venous confluence. Further evaluation with nonemergent contrast enhanced MRI of the pancreas is recommended. 2. Small indeterminate low-attenuation structure is noted within segment 4 a of the  liver. This could be better addressed at MRI.  MR 06/09/2018 ADDENDUM: Request made by Dr. Ronald Lobo GI service to evaluate for vascular involvement by the pancreatic mass.  The pancreatic mass involves approximately 40 percent of the main portal vein circumference at the portal splenic venous confluence, with associated mild narrowing of the portal splenic venous confluence. The SMV, splenic vein and main, right and left portal veins remain patent. The celiac trunk and SMA are not involved by the pancreatic mass. IMPRESSION: 2.3 cm diameter hypoenhancing mass at the junction of the head and neck of pancreas with pancreatic ductal dilatation, likely adenocarcinoma.  Intra and extrahepatic bile duct dilatation with abrupt change in caliber at the mid common bile duct. This could indicate an obstructing mass or stricture.  ERCP Therisa Doyne 06/10/18 Distal biliary stricture likely related to extrinsic compression from malignant pancreatic mass. - The upper third of the main bile duct and middle third of the main bile duct were moderately dilated. - A biliary sphincterotomy was performed. - The biliary tree was swept and nothing was found. - One covered metal stent was placed into the common bile duct.  EUS [place] 06/10/18  There was dilation in the common bile duct which measured up to 12 mm. - A mass was identified in the pancreatic head. This was staged T3 N1 Mx by endosonographic criteria. Fine needle aspiration performed. - A few lymph nodes were visualized and measured in the peripancreatic region. - There was no evidence of significant pathology in the left lobe of the liver There was sonographic evidence suggesting invasion into the portal vein (manifested by invasion), the superior mesenteric vein (manifested by abutment), the splenic vein (manifested by abutment) and the splenoportal confluence (  manifested by invasion).  cytology 06/10/2018 Diagnosis PANCREAS, FINE NEEDLE  ASPIRATION (SPECIMEN 1 OF 1 COLLECTED 06/10/18): MALIGNANT CELLS CONSISTENT WITH ADENOCARCINOMA   Past Surgical History (Tanisha A. Owens Shark, [place]; 06/27/2018 11:50 AM) Vasectomy   Diagnostic Studies History (Tanisha A. Owens Shark, [place]; 06/27/2018 11:50 AM) Colonoscopy  never  Allergies (Tanisha A. Owens Shark, [place]; 06/27/2018 11:51 AM) No Known Drug Allergies [06/27/2018]: Allergies Reconciled   Medication History (Tanisha A. Owens Shark, [name]; 21/19/4174 08:14 AM) Folic Acid (1MG  Tablet, Oral) Active. NexIUM 24HR (20MG  Capsule DR, Oral) Active. traMADol HCl (50MG  Tablet, Oral) Active. Multi-Vitamin (Oral) Active. Omeprazole (20MG  Capsule DR, Oral) Active. Medications Reconciled  Social History (Tanisha A. Owens Shark, [place]; 06/27/2018 11:50 AM) Alcohol use  Occasional alcohol use. Caffeine use  Coffee. No drug use  Tobacco use  Current every day smoker.  Family History (Tanisha A. Owens Shark, [place]; 06/27/2018 11:50 AM) Malignant Neoplasm Of Pancreas  Family Members In General.  Other Problems (Tanisha A. Owens Shark, [place]; 06/27/2018 11:50 AM) Gastroesophageal Reflux Disease     Review of Systems (Tanisha A. Brown RMA; 06/27/2018 11:50 AM) General Present- Weight Loss. Not Present- Appetite Loss, Chills, Fatigue, Fever, Night Sweats and Weight Gain. Skin Not Present- Change in Wart/Mole, Dryness, Hives, Jaundice, New Lesions, Non-Healing Wounds, Rash and Ulcer. HEENT Present- Wears glasses/contact lenses. Not Present- Earache, Hearing Loss, Hoarseness, Nose Bleed, Oral Ulcers, Ringing in the Ears, Seasonal Allergies, Sinus Pain, Sore Throat, Visual Disturbances and Yellow Eyes. Respiratory Present- Snoring. Not Present- Bloody sputum, Chronic Cough, Difficulty Breathing and Wheezing. Breast Not Present- Breast Mass, Breast Pain, Nipple Discharge and Skin Changes. Cardiovascular Not Present- Chest Pain, Difficulty Breathing Lying Down, Leg Cramps, Palpitations, Rapid Heart Rate, Shortness of Breath  and Swelling of Extremities. Gastrointestinal Present- Abdominal Pain, Change in Bowel Habits and Excessive gas. Not Present- Bloating, Bloody Stool, Chronic diarrhea, Constipation, Difficulty Swallowing, Gets full quickly at meals, Hemorrhoids, Indigestion, Nausea, Rectal Pain and Vomiting. Male Genitourinary Not Present- Blood in Urine, Change in Urinary Stream, Frequency, Impotence, Nocturia, Painful Urination, Urgency and Urine Leakage. Musculoskeletal Not Present- Back Pain, Joint Pain, Joint Stiffness, Muscle Pain, Muscle Weakness and Swelling of Extremities. Neurological Not Present- Decreased Memory, Fainting, Headaches, Numbness, Seizures, Tingling, Tremor, Trouble walking and Weakness. Psychiatric Present- Change in Sleep Pattern. Not Present- Anxiety, Bipolar, Depression, Fearful and Frequent crying. Endocrine Not Present- Cold Intolerance, Excessive Hunger, Hair Changes, Heat Intolerance, Hot flashes and New Diabetes. Hematology Not Present- Blood Thinners, Easy Bruising, Excessive bleeding, Gland problems, HIV and Persistent Infections.  Vitals (Tanisha A. Brown RMA; 06/27/2018 11:51 AM) 06/27/2018 11:50 AM Weight: 194.6 lb Height: 72in Body Surface Area: 2.11 m Body Mass Index: 26.39 kg/m  Temp.: 98.71F  Pulse: 78 (Regular)  BP: 134/82 (Sitting, Left Arm, Standard)       Physical Exam Stark Klein MD; 06/27/2018 1:09 PM) General Mental Status-Alert. General Appearance-Consistent with stated age. Hydration-Well hydrated. Voice-Normal.  Head and Neck Head-normocephalic, atraumatic with no lesions or palpable masses. Trachea-midline. Thyroid Gland Characteristics - normal size and consistency.  Eye Eyeball - Bilateral-Extraocular movements intact. Sclera/Conjunctiva - Bilateral-No scleral icterus.  Chest and Lung Exam Chest and lung exam reveals -quiet, even and easy respiratory effort with no use of accessory muscles and on  auscultation, normal breath sounds, no adventitious sounds and normal vocal resonance. Inspection Chest Wall - Normal. Back - normal.  Cardiovascular Cardiovascular examination reveals -normal heart sounds, regular rate and rhythm with no murmurs and normal pedal pulses bilaterally.  Abdomen Inspection Inspection of the abdomen reveals - No Hernias.  Palpation/Percussion Palpation and Percussion of the abdomen reveal - Soft, Non Tender, No Rebound tenderness, No Rigidity (guarding) and No hepatosplenomegaly. Auscultation Auscultation of the abdomen reveals - Bowel sounds normal.  Neurologic Neurologic evaluation reveals -alert and oriented x 3 with no impairment of recent or remote memory. Mental Status-Normal.  Musculoskeletal Global Assessment -Note: no gross deformities.  Normal Exam - Left-Upper Extremity Strength Normal and Lower Extremity Strength Normal. Normal Exam - Right-Upper Extremity Strength Normal and Lower Extremity Strength Normal.  Lymphatic Head & Neck  General Head & Neck Lymphatics: Bilateral - Description - Normal. Axillary  General Axillary Region: Bilateral - Description - Normal. Tenderness - Non Tender. Femoral & Inguinal  Generalized Femoral & Inguinal Lymphatics: Bilateral - Description - No Generalized lymphadenopathy.    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 Because of the venous invasion, I would call this borderline resectable. Because of the vascular invasion as well as the lymphadenopathy, I would recommend neoadjuvant chemotherapy. I discussed port placement with the patient and his family. I will plan to coordination with oncology. The patient lives in [place], but would like to have tx in [place].  He may need neoadjuvant stereotactic radiation as well.  I  reviewed risks of port.  I spoke to oncology to expedite appt. He will be seen this afternoon. Current Plans Pt Education - ccs port insertion education You are being scheduled for surgery- Our schedulers will call you.  You should hear from our office's scheduling department within 5 working days about the location, date, and time of surgery. We try to make accommodations for patient's preferences in scheduling surgery, but sometimes the OR schedule or the surgeon's schedule prevents [name] from making those accommodations.  If you have not heard from our office [phone] in 5 working days, call the office and ask for your surgeon's nurse.  If you have other questions about your diagnosis, plan, or surgery, call the office and ask for your surgeon's nurse.  Pt Education - [facility]b whipple pt info   Signed by Stark Klein, MD (06/27/2018 1:15 PM)

## 2018-06-30 NOTE — Telephone Encounter (Signed)
Spoke with patient wife and verified the patients appointment.

## 2018-06-30 NOTE — Progress Notes (Signed)
  Oncology Nurse Navigator Documentation  Message sent to Dr. Barry Dienes and Illene Regulus at [facility] requesting PORT placement with target date of chemo on 07/04/18. Called Jovon Winterhalter to let her know that we are working on  [facility] placement.

## 2018-06-30 NOTE — Telephone Encounter (Signed)
TCT pt-spoke with his wife. Advised her that her husband's chest CT scan has been scheduled sooner in the week to Wednesday, 07/02/18 @ 12:45 pm. She voiced understanding. Reviewed other appts for this week, including chemo on Saturday 07/05/18. Wife states that pt has not had port placed yet. She states that Dr. Barry Dienes is to put that in. Discussed this with Arna Snipe, RN GI  Navigator. Dawn will contact Dr. Marlowe Aschoff office. Advised wife of this and that I would call her back regarding port placement and then subsequent plans for Chemotherapy.

## 2018-07-01 ENCOUNTER — Other Ambulatory Visit: Payer: Self-pay

## 2018-07-01 ENCOUNTER — Other Ambulatory Visit: Payer: Managed Care, Other (non HMO)

## 2018-07-01 ENCOUNTER — Encounter (HOSPITAL_COMMUNITY): Payer: Self-pay | Admitting: *Deleted

## 2018-07-01 ENCOUNTER — Telehealth: Payer: Self-pay | Admitting: Nurse Practitioner

## 2018-07-01 NOTE — Telephone Encounter (Signed)
Tried to reach regarding 11/29 I did leave a message

## 2018-07-02 ENCOUNTER — Ambulatory Visit (HOSPITAL_COMMUNITY): Payer: Managed Care, Other (non HMO)

## 2018-07-02 ENCOUNTER — Ambulatory Visit (HOSPITAL_COMMUNITY): Payer: Managed Care, Other (non HMO) | Admitting: Anesthesiology

## 2018-07-02 ENCOUNTER — Ambulatory Visit (HOSPITAL_COMMUNITY)
Admission: RE | Admit: 2018-07-02 | Discharge: 2018-07-02 | Disposition: A | Discharge status: Home / Self Care | Payer: Managed Care, Other (non HMO) | Source: Ambulatory Visit | Attending: General Surgery | Admitting: General Surgery

## 2018-07-02 ENCOUNTER — Encounter (HOSPITAL_COMMUNITY)
Admission: RE | Disposition: A | Discharge status: Home / Self Care | Payer: Self-pay | Source: Ambulatory Visit | Attending: General Surgery

## 2018-07-02 ENCOUNTER — Other Ambulatory Visit: Payer: Self-pay

## 2018-07-02 ENCOUNTER — Telehealth: Payer: Self-pay | Admitting: Hematology

## 2018-07-02 ENCOUNTER — Other Ambulatory Visit: Payer: Self-pay | Admitting: Hematology

## 2018-07-02 ENCOUNTER — Encounter (HOSPITAL_COMMUNITY): Payer: Self-pay

## 2018-07-02 DIAGNOSIS — I700 Atherosclerosis of aorta: Secondary | ICD-10-CM | POA: Insufficient documentation

## 2018-07-02 DIAGNOSIS — C259 Malignant neoplasm of pancreas, unspecified: Secondary | ICD-10-CM | POA: Diagnosis present

## 2018-07-02 DIAGNOSIS — Z800 Family history of malignant neoplasm of digestive organs: Secondary | ICD-10-CM | POA: Insufficient documentation

## 2018-07-02 DIAGNOSIS — K219 Gastro-esophageal reflux disease without esophagitis: Secondary | ICD-10-CM | POA: Diagnosis not present

## 2018-07-02 DIAGNOSIS — Z95828 Presence of other vascular implants and grafts: Secondary | ICD-10-CM

## 2018-07-02 DIAGNOSIS — K8681 Exocrine pancreatic insufficiency: Secondary | ICD-10-CM | POA: Diagnosis not present

## 2018-07-02 DIAGNOSIS — I2510 Atherosclerotic heart disease of native coronary artery without angina pectoris: Secondary | ICD-10-CM | POA: Diagnosis not present

## 2018-07-02 DIAGNOSIS — F17200 Nicotine dependence, unspecified, uncomplicated: Secondary | ICD-10-CM | POA: Diagnosis not present

## 2018-07-02 DIAGNOSIS — Z79899 Other long term (current) drug therapy: Secondary | ICD-10-CM | POA: Diagnosis not present

## 2018-07-02 DIAGNOSIS — I712 Thoracic aortic aneurysm, without rupture: Secondary | ICD-10-CM | POA: Insufficient documentation

## 2018-07-02 DIAGNOSIS — C250 Malignant neoplasm of head of pancreas: Secondary | ICD-10-CM | POA: Insufficient documentation

## 2018-07-02 HISTORY — DX: Gastro-esophageal reflux disease without esophagitis: K21.9

## 2018-07-02 HISTORY — DX: Malignant (primary) neoplasm, unspecified: C80.1

## 2018-07-02 HISTORY — PX: PORTACATH PLACEMENT: SHX2246

## 2018-07-02 SURGERY — INSERTION, TUNNELED CENTRAL VENOUS DEVICE, WITH PORT
Anesthesia: General | Site: Chest

## 2018-07-02 MED ORDER — FENTANYL CITRATE (PF) 250 MCG/5ML IJ SOLN
INTRAMUSCULAR | Status: AC
  Filled 2018-07-02: qty 5

## 2018-07-02 MED ORDER — LIDOCAINE HCL 1 % IJ SOLN
INTRAMUSCULAR | Status: AC
  Filled 2018-07-02: qty 20

## 2018-07-02 MED ORDER — BUPIVACAINE-EPINEPHRINE (PF) 0.25% -1:200000 IJ SOLN
INTRAMUSCULAR | Status: AC
  Filled 2018-07-02: qty 30

## 2018-07-02 MED ORDER — 0.9 % SODIUM CHLORIDE (POUR BTL) OPTIME
TOPICAL | Status: DC | PRN
  Administered 2018-07-02: 1000 mL

## 2018-07-02 MED ORDER — LIDOCAINE 2% (20 MG/ML) 5 ML SYRINGE
INTRAMUSCULAR | Status: AC
  Filled 2018-07-02: qty 5

## 2018-07-02 MED ORDER — DEXAMETHASONE SODIUM PHOSPHATE 10 MG/ML IJ SOLN
INTRAMUSCULAR | Status: AC
  Filled 2018-07-02: qty 3

## 2018-07-02 MED ORDER — CELECOXIB 200 MG PO CAPS
ORAL_CAPSULE | ORAL | Status: AC
  Administered 2018-07-02: 200 mg via ORAL
  Filled 2018-07-02: qty 1

## 2018-07-02 MED ORDER — ROCURONIUM BROMIDE 50 MG/5ML IV SOSY
PREFILLED_SYRINGE | INTRAVENOUS | Status: AC
  Filled 2018-07-02: qty 15

## 2018-07-02 MED ORDER — ACETAMINOPHEN 500 MG PO TABS
ORAL_TABLET | ORAL | Status: AC
  Administered 2018-07-02: 1000 mg via ORAL
  Filled 2018-07-02: qty 2

## 2018-07-02 MED ORDER — LACTATED RINGERS IV SOLN
INTRAVENOUS | Status: DC | PRN
  Administered 2018-07-02: 11:00:00 via INTRAVENOUS

## 2018-07-02 MED ORDER — LACTATED RINGERS IV SOLN
INTRAVENOUS | Status: DC
  Administered 2018-07-02: 11:00:00 via INTRAVENOUS

## 2018-07-02 MED ORDER — CELECOXIB 200 MG PO CAPS
200.0000 mg | ORAL_CAPSULE | ORAL | Status: AC
  Administered 2018-07-02: 200 mg via ORAL

## 2018-07-02 MED ORDER — PROPOFOL 10 MG/ML IV BOLUS
INTRAVENOUS | Status: AC
  Filled 2018-07-02: qty 20

## 2018-07-02 MED ORDER — CHLORHEXIDINE GLUCONATE CLOTH 2 % EX PADS: 6.0000 | MEDICATED_PAD | Freq: Once | CUTANEOUS | Status: DC

## 2018-07-02 MED ORDER — PHENYLEPHRINE HCL 10 MG/ML IJ SOLN
INTRAMUSCULAR | Status: DC | PRN
  Administered 2018-07-02 (×3): 80 ug via INTRAVENOUS

## 2018-07-02 MED ORDER — EPHEDRINE 5 MG/ML INJ
INTRAVENOUS | Status: AC
  Filled 2018-07-02: qty 20

## 2018-07-02 MED ORDER — ACETAMINOPHEN 325 MG PO TABS: 325.0000 mg | ORAL_TABLET | ORAL | Status: DC | PRN

## 2018-07-02 MED ORDER — GABAPENTIN 300 MG PO CAPS
ORAL_CAPSULE | ORAL | Status: AC
  Administered 2018-07-02: 300 mg via ORAL
  Filled 2018-07-02: qty 1

## 2018-07-02 MED ORDER — FENTANYL CITRATE (PF) 100 MCG/2ML IJ SOLN
INTRAMUSCULAR | Status: DC | PRN
  Administered 2018-07-02: 50 ug via INTRAVENOUS

## 2018-07-02 MED ORDER — CEFAZOLIN SODIUM-DEXTROSE 2-4 GM/100ML-% IV SOLN
2.0000 g | INTRAVENOUS | Status: AC
  Administered 2018-07-02: 2 g via INTRAVENOUS

## 2018-07-02 MED ORDER — PROPOFOL 10 MG/ML IV BOLUS
INTRAVENOUS | Status: DC | PRN
  Administered 2018-07-02: 200 mg via INTRAVENOUS

## 2018-07-02 MED ORDER — LIDOCAINE HCL 1 % IJ SOLN
INTRAMUSCULAR | Status: DC | PRN
  Administered 2018-07-02: 13 mL

## 2018-07-02 MED ORDER — ONDANSETRON HCL 4 MG/2ML IJ SOLN
INTRAMUSCULAR | Status: AC
  Filled 2018-07-02: qty 4

## 2018-07-02 MED ORDER — MEPERIDINE HCL 50 MG/ML IJ SOLN: 6.2500 mg | INTRAMUSCULAR | Status: DC | PRN

## 2018-07-02 MED ORDER — ARTIFICIAL TEARS OPHTHALMIC OINT
TOPICAL_OINTMENT | OPHTHALMIC | Status: AC
  Filled 2018-07-02: qty 3.5

## 2018-07-02 MED ORDER — ACETAMINOPHEN 500 MG PO TABS
1000.0000 mg | ORAL_TABLET | ORAL | Status: AC
  Administered 2018-07-02: 1000 mg via ORAL

## 2018-07-02 MED ORDER — FENTANYL CITRATE (PF) 100 MCG/2ML IJ SOLN: 25.0000 ug | INTRAMUSCULAR | Status: DC | PRN

## 2018-07-02 MED ORDER — GABAPENTIN 300 MG PO CAPS
300.0000 mg | ORAL_CAPSULE | ORAL | Status: AC
  Administered 2018-07-02: 300 mg via ORAL

## 2018-07-02 MED ORDER — LIDOCAINE 2% (20 MG/ML) 5 ML SYRINGE
INTRAMUSCULAR | Status: DC | PRN
  Administered 2018-07-02: 100 mg via INTRAVENOUS

## 2018-07-02 MED ORDER — SODIUM CHLORIDE 0.9 % IV SOLN
INTRAVENOUS | Status: DC | PRN
  Administered 2018-07-02: 500 mL

## 2018-07-02 MED ORDER — ACETAMINOPHEN 160 MG/5ML PO SOLN: 325.0000 mg | ORAL | Status: DC | PRN

## 2018-07-02 MED ORDER — ONDANSETRON HCL 4 MG/2ML IJ SOLN: 4.0000 mg | Freq: Once | INTRAMUSCULAR | Status: DC | PRN

## 2018-07-02 MED ORDER — OXYCODONE HCL 5 MG/5ML PO SOLN: 5.0000 mg | Freq: Once | ORAL | Status: DC | PRN

## 2018-07-02 MED ORDER — SODIUM CHLORIDE 0.9 % IV SOLN
INTRAVENOUS | Status: AC
  Filled 2018-07-02: qty 1.2

## 2018-07-02 MED ORDER — MIDAZOLAM HCL 5 MG/5ML IJ SOLN
INTRAMUSCULAR | Status: DC | PRN
  Administered 2018-07-02: 2 mg via INTRAVENOUS

## 2018-07-02 MED ORDER — HEPARIN SOD (PORK) LOCK FLUSH 100 UNIT/ML IV SOLN
INTRAVENOUS | Status: AC
  Filled 2018-07-02: qty 5

## 2018-07-02 MED ORDER — OXYCODONE HCL 5 MG PO TABS: 5.0000 mg | ORAL_TABLET | Freq: Four times a day (QID) | ORAL | 0 refills | Status: DC | PRN

## 2018-07-02 MED ORDER — CEFAZOLIN SODIUM-DEXTROSE 2-4 GM/100ML-% IV SOLN
INTRAVENOUS | Status: AC
  Filled 2018-07-02: qty 100

## 2018-07-02 MED ORDER — OXYCODONE HCL 5 MG PO TABS: 5.0000 mg | ORAL_TABLET | Freq: Once | ORAL | Status: DC | PRN

## 2018-07-02 MED ORDER — MIDAZOLAM HCL 2 MG/2ML IJ SOLN
INTRAMUSCULAR | Status: AC
  Filled 2018-07-02: qty 2

## 2018-07-02 MED ORDER — HEPARIN SOD (PORK) LOCK FLUSH 100 UNIT/ML IV SOLN
INTRAVENOUS | Status: DC | PRN
  Administered 2018-07-02: 500 [IU]

## 2018-07-02 SURGICAL SUPPLY — 42 items
BAG DECANTER FOR FLEXI CONT (MISCELLANEOUS) ×3 IMPLANT
BLADE HEX COATED 2.75 (ELECTRODE) ×3 IMPLANT
CANISTER SUCT 3000ML PPV (MISCELLANEOUS) IMPLANT
CHLORAPREP W/TINT 10.5 ML (MISCELLANEOUS) ×3 IMPLANT
COVER SURGICAL LIGHT HANDLE (MISCELLANEOUS) ×3 IMPLANT
COVER TRANSDUCER ULTRASND GEL (DRAPE) IMPLANT
COVER WAND RF STERILE (DRAPES) ×3 IMPLANT
CRADLE DONUT ADULT HEAD (MISCELLANEOUS) ×3 IMPLANT
DECANTER SPIKE VIAL GLASS SM (MISCELLANEOUS) ×6 IMPLANT
DERMABOND ADVANCED (GAUZE/BANDAGES/DRESSINGS) ×2
DERMABOND ADVANCED .7 DNX12 (GAUZE/BANDAGES/DRESSINGS) ×1 IMPLANT
DRAPE C-ARM 42X72 X-RAY (DRAPES) ×3 IMPLANT
DRAPE CHEST BREAST 15X10 FENES (DRAPES) ×3 IMPLANT
DRAPE WARM FLUID 44X44 (DRAPE) IMPLANT
DRSG TEGADERM 4X4.75 (GAUZE/BANDAGES/DRESSINGS) ×6 IMPLANT
ELECT REM PT RETURN 9FT ADLT (ELECTROSURGICAL) ×3
ELECTRODE REM PT RTRN 9FT ADLT (ELECTROSURGICAL) ×1 IMPLANT
GAUZE 4X4 16PLY RFD (DISPOSABLE) ×3 IMPLANT
GAUZE SPONGE 4X4 12PLY STRL (GAUZE/BANDAGES/DRESSINGS) ×3 IMPLANT
GEL ULTRASOUND 20GR AQUASONIC (MISCELLANEOUS) IMPLANT
GLOVE BIO SURGEON STRL SZ 6 (GLOVE) ×3 IMPLANT
GLOVE INDICATOR 6.5 STRL GRN (GLOVE) ×3 IMPLANT
GOWN STRL REUS W/ TWL LRG LVL3 (GOWN DISPOSABLE) ×1 IMPLANT
GOWN STRL REUS W/TWL 2XL LVL3 (GOWN DISPOSABLE) ×3 IMPLANT
GOWN STRL REUS W/TWL LRG LVL3 (GOWN DISPOSABLE) ×2
KIT BASIN OR (CUSTOM PROCEDURE TRAY) ×3 IMPLANT
KIT PORT POWER 8FR ISP CVUE (Port) ×3 IMPLANT
KIT TURNOVER KIT B (KITS) ×3 IMPLANT
NS IRRIG 1000ML POUR BTL (IV SOLUTION) ×3 IMPLANT
PAD ARMBOARD 7.5X6 YLW CONV (MISCELLANEOUS) ×3 IMPLANT
PENCIL BUTTON HOLSTER BLD 10FT (ELECTRODE) ×3 IMPLANT
SUT MON AB 4-0 PC3 18 (SUTURE) ×3 IMPLANT
SUT PROLENE 2 0 SH DA (SUTURE) ×6 IMPLANT
SUT VIC AB 3-0 SH 27 (SUTURE) ×2
SUT VIC AB 3-0 SH 27X BRD (SUTURE) ×1 IMPLANT
SYR 5ML LUER SLIP (SYRINGE) ×3 IMPLANT
TOWEL OR 17X24 6PK STRL BLUE (TOWEL DISPOSABLE) ×3 IMPLANT
TOWEL OR 17X26 10 PK STRL BLUE (TOWEL DISPOSABLE) ×3 IMPLANT
TRAY LAPAROSCOPIC MC (CUSTOM PROCEDURE TRAY) ×3 IMPLANT
TUBE CONNECTING 12'X1/4 (SUCTIONS)
TUBE CONNECTING 12X1/4 (SUCTIONS) IMPLANT
YANKAUER SUCT BULB TIP NO VENT (SUCTIONS) IMPLANT

## 2018-07-02 NOTE — Interval H&P Note (Signed)
History and Physical Interval Note:  07/02/2018 12:07 PM  [name]  has presented today for surgery, with the diagnosis of pancreatic cancer  The various methods of treatment have been discussed with the patient and family. After consideration of risks, benefits and other options for treatment, the patient has consented to  Procedure(s): Dendron (N/A) as a surgical intervention .  The patient's history has been reviewed, patient examined, no change in status, stable for surgery.  I have reviewed the patient's chart and labs.  Questions were answered to the patient's satisfaction.     Stark Klein

## 2018-07-02 NOTE — Anesthesia Preprocedure Evaluation (Addendum)
Anesthesia Evaluation  Patient identified by MRN, date of birth, ID band Patient awake    Reviewed: Allergy & Precautions, NPO status , Patient's Chart, lab work & pertinent test results  Airway Mallampati: II  TM Distance: >3 FB Neck ROM: Full    Dental no notable dental hx. (+) Dental Advidsory Given, Edentulous Upper, Upper Dentures   Pulmonary Current Smoker,    Pulmonary exam normal breath sounds clear to auscultation       Cardiovascular negative cardio ROS Normal cardiovascular exam Rhythm:Regular Rate:Normal     Neuro/Psych negative neurological ROS  negative psych ROS   GI/Hepatic Neg liver ROS, GERD  Medicated and Controlled,  Endo/Other  negative endocrine ROS  Renal/GU negative Renal ROS  negative genitourinary   Musculoskeletal negative musculoskeletal ROS (+)   Abdominal   Peds negative pediatric ROS (+)  Hematology negative hematology ROS (+)   Anesthesia Other Findings   Reproductive/Obstetrics negative OB ROS                           Anesthesia Physical  Anesthesia Plan  ASA: II  Anesthesia Plan: General   Post-op Pain Management:    Induction: Intravenous  PONV Risk Score and Plan: 1 and Ondansetron and Treatment may vary due to age or medical condition  Airway Management Planned: LMA  Additional Equipment:   Intra-op Plan:   Post-operative Plan: Extubation in OR  Informed Consent: I have reviewed the patients History and Physical, chart, labs and discussed the procedure including the risks, benefits and alternatives for the proposed anesthesia with the patient or authorized representative who has indicated his/her understanding and acceptance.   Dental advisory given and Dental Advisory Given  Plan Discussed with: CRNA, Anesthesiologist and Surgeon  Anesthesia Plan Comments: ( )       Anesthesia Quick Evaluation

## 2018-07-02 NOTE — Telephone Encounter (Signed)
I called pt and briefly discussed his CT chest findings. I plan to meet him on Monday 12/2 to review in person, when he comes in for pump D/C, he agrees. scheduling message sent.   [name]  07/02/2018

## 2018-07-02 NOTE — Op Note (Signed)
PREOPERATIVE DIAGNOSIS:  Pancreatic cancer     POSTOPERATIVE DIAGNOSIS:  Same     PROCEDURE: Left subclavian port placement, Bard ClearVue  Power Port, MRI safe, 8-French.      SURGEON:  Stark Klein, MD      ANESTHESIA:  General   FINDINGS:  Good venous return, easy flush, and tip of the catheter and   SVC 23.5 cm.      SPECIMEN:  None.      ESTIMATED BLOOD LOSS:  Minimal.      COMPLICATIONS:  None known.      PROCEDURE:  Pt was identified in the holding area and taken to   the operating room, where patient was placed supine on the operating room   table.  General anesthesia was induced.  Patient's arms were tucked and the upper   chest and neck were prepped and draped in sterile fashion.  Time-out was   performed according to the surgical safety check list.  When all was   correct, we continued.   Local anesthetic was administered over this   area at the angle of the clavicle.  The vein was accessed with 1 pass(es) of the needle. There was good venous return and the wire passed easily with no ectopy.   Fluoroscopy was used to confirm that the wire was in the vena cava.      The patient was placed back level and the area for the pocket was anethetized   with local anesthetic.  A 3-cm transverse incision was made with a #15   blade.  Cautery was used to divide the subcutaneous tissues down to the   pectoralis muscle.  An [employer] retractor was used to elevate the skin   while a pocket was created on top of the pectoralis fascia.  The port   was placed into the pocket to confirm that it was of adequate size.  The   catheter was preattached to the port.  The port was then secured to the   pectoralis fascia with four 2-0 Prolene sutures.  These were clamped and   not tied down yet.    The catheter was tunneled through to the wire exit   site.  The catheter was placed along the wire to determine what length it should be to be in the SVC.  The catheter was cut at 23.5 cm.  The  tunneler sheath and dilator were passed over the wire and the dilator and wire were removed.  The catheter was advanced through the tunneler sheath and the tunneler sheath was pulled away.  Care was taken to keep the catheter in the tunneler sheath as this occurred. This was advanced and the tunneler sheath was removed.  There was good venous   return and easy flush of the catheter.  The Prolene sutures were tied   down to the pectoral fascia.  The skin was reapproximated using 3-0   Vicryl interrupted deep dermal sutures.    Fluoroscopy was used to re-confirm good position of the catheter.  The skin   was then closed using 4-0 Monocryl in a subcuticular fashion.  The port was flushed with concentrated heparin flush as well.  The wounds were then cleaned, dried, and dressed with Dermabond.  The patient was awakened from anesthesia and taken to the PACU in stable condition.  Needle, sponge, and instrument counts were correct.               Stark Klein, MD

## 2018-07-02 NOTE — Transfer of Care (Signed)
Immediate Anesthesia Transfer of Care Note  Patient: Ernest Wu  Procedure(s) Performed: INSERTION PORT-A-CATH (N/A Chest)  Patient Location: PACU  Anesthesia Type:General  Level of Consciousness: awake, alert  and oriented  Airway & Oxygen Therapy: Patient Spontanous Breathing and Patient connected to nasal cannula oxygen  Post-op Assessment: Report given to RN, Post -op Vital signs reviewed and stable and Patient moving all extremities X 4  Post vital signs: Reviewed and stable  Last Vitals:  Vitals Value Taken Time  BP 143/88 07/02/2018  1:14 PM  Temp    Pulse 86 07/02/2018  1:14 PM  Resp 17 07/02/2018  1:14 PM  SpO2 100 % 07/02/2018  1:14 PM  Vitals shown include unvalidated device data.  Last Pain:  Vitals:   07/02/18 1108  TempSrc: Oral  PainSc: 0-No pain         Complications: No apparent anesthesia complications

## 2018-07-02 NOTE — Anesthesia Procedure Notes (Signed)
Procedure Name: LMA Insertion Date/Time: 07/02/2018 12:21 PM Performed by: Neldon Newport, CRNA Pre-anesthesia Checklist: Timeout performed, Patient being monitored, Suction available, Emergency Drugs available and Patient identified Patient Re-evaluated:Patient Re-evaluated prior to induction Oxygen Delivery Method: Circle system utilized Preoxygenation: Pre-oxygenation with 100% oxygen Induction Type: IV induction Ventilation: Mask ventilation without difficulty LMA: LMA inserted LMA Size: 5.0 Number of attempts: 1 Placement Confirmation: breath sounds checked- equal and bilateral and positive ETCO2 Tube secured with: Tape Dental Injury: Teeth and Oropharynx as per pre-operative assessment

## 2018-07-04 ENCOUNTER — Inpatient Hospital Stay: Payer: Managed Care, Other (non HMO)

## 2018-07-04 ENCOUNTER — Encounter (HOSPITAL_COMMUNITY): Payer: Self-pay | Admitting: General Surgery

## 2018-07-04 ENCOUNTER — Ambulatory Visit (HOSPITAL_COMMUNITY): Payer: Managed Care, Other (non HMO)

## 2018-07-04 DIAGNOSIS — C257 Malignant neoplasm of other parts of pancreas: Secondary | ICD-10-CM | POA: Diagnosis not present

## 2018-07-04 LAB — CMP (CANCER CENTER ONLY)
ALT: 44 U/L (ref 0–44)
AST: 22 U/L (ref 15–41)
Albumin: 3.7 g/dL (ref 3.5–5.0)
Alkaline Phosphatase: 108 U/L (ref 38–126)
Anion gap: 9 (ref 5–15)
BUN: 8 mg/dL (ref 6–20)
CHLORIDE: 106 mmol/L (ref 98–111)
CO2: 27 mmol/L (ref 22–32)
CREATININE: 0.75 mg/dL (ref 0.61–1.24)
Calcium: 9.4 mg/dL (ref 8.9–10.3)
Glucose, Bld: 109 mg/dL — ABNORMAL HIGH (ref 70–99)
POTASSIUM: 4 mmol/L (ref 3.5–5.1)
SODIUM: 142 mmol/L (ref 135–145)
Total Bilirubin: 1 mg/dL (ref 0.3–1.2)
Total Protein: 6.8 g/dL (ref 6.5–8.1)

## 2018-07-04 LAB — CBC WITH DIFFERENTIAL (CANCER CENTER ONLY)
ABS IMMATURE GRANULOCYTES: 0.01 10*3/uL (ref 0.00–0.07)
BASOS ABS: 0 10*3/uL (ref 0.0–0.1)
Basophils Relative: 1 %
Eosinophils Absolute: 0.3 10*3/uL (ref 0.0–0.5)
Eosinophils Relative: 5 %
HEMATOCRIT: 42.2 % (ref 39.0–52.0)
HEMOGLOBIN: 13.9 g/dL (ref 13.0–17.0)
IMMATURE GRANULOCYTES: 0 %
LYMPHS ABS: 1.3 10*3/uL (ref 0.7–4.0)
Lymphocytes Relative: 27 %
MCH: 29.7 pg (ref 26.0–34.0)
MCHC: 32.9 g/dL (ref 30.0–36.0)
MCV: 90.2 fL (ref 80.0–100.0)
Monocytes Absolute: 0.4 10*3/uL (ref 0.1–1.0)
Monocytes Relative: 8 %
NEUTROS ABS: 2.9 10*3/uL (ref 1.7–7.7)
NRBC: 0 % (ref 0.0–0.2)
Neutrophils Relative %: 59 %
Platelet Count: 141 10*3/uL — ABNORMAL LOW (ref 150–400)
RBC: 4.68 MIL/uL (ref 4.22–5.81)
RDW: 12.5 % (ref 11.5–15.5)
WBC: 5 10*3/uL (ref 4.0–10.5)

## 2018-07-04 NOTE — Anesthesia Postprocedure Evaluation (Signed)
Anesthesia Post Note  Patient: MEGHAN TIEMANN  Procedure(s) Performed: INSERTION PORT-A-CATH (N/A Chest)     Patient location during evaluation: PACU Anesthesia Type: General Level of consciousness: awake and alert Pain management: pain level controlled Vital Signs Assessment: post-procedure vital signs reviewed and stable Respiratory status: spontaneous breathing, nonlabored ventilation, respiratory function stable and patient connected to nasal cannula oxygen Cardiovascular status: blood pressure returned to baseline and stable Postop Assessment: no apparent nausea or vomiting Anesthetic complications: no    Last Vitals:  Vitals:   07/02/18 1335 07/02/18 1436  BP: (!) 130/95 (!) 141/95  Pulse:  63  Resp: 16 17  Temp: 36.8 C   SpO2: 100% 100%    Last Pain:  Vitals:   07/02/18 1436  TempSrc:   PainSc: 0-No pain                 Clio Gerhart

## 2018-07-05 ENCOUNTER — Inpatient Hospital Stay: Payer: Managed Care, Other (non HMO)

## 2018-07-05 VITALS — BP 137/88 | HR 67 | Temp 97.7°F | Resp 17

## 2018-07-05 DIAGNOSIS — C259 Malignant neoplasm of pancreas, unspecified: Secondary | ICD-10-CM

## 2018-07-05 DIAGNOSIS — C257 Malignant neoplasm of other parts of pancreas: Secondary | ICD-10-CM | POA: Diagnosis not present

## 2018-07-05 LAB — CANCER ANTIGEN 19-9: CAN 19-9: 17 U/mL (ref 0–35)

## 2018-07-05 MED ORDER — OXALIPLATIN CHEMO INJECTION 100 MG/20ML
85.0000 mg/m2 | Freq: Once | INTRAVENOUS | Status: AC
  Administered 2018-07-05: 180 mg via INTRAVENOUS
  Filled 2018-07-05: qty 36

## 2018-07-05 MED ORDER — LEUCOVORIN CALCIUM INJECTION 350 MG
400.0000 mg/m2 | Freq: Once | INTRAVENOUS | Status: AC
  Administered 2018-07-05: 840 mg via INTRAVENOUS
  Filled 2018-07-05: qty 42

## 2018-07-05 MED ORDER — ATROPINE SULFATE 1 MG/ML IJ SOLN: 0.5000 mg | Freq: Once | INTRAMUSCULAR | Status: DC | PRN

## 2018-07-05 MED ORDER — DEXTROSE 5 % IV SOLN
Freq: Once | INTRAVENOUS | Status: AC
  Administered 2018-07-05: 08:00:00 via INTRAVENOUS
  Filled 2018-07-05: qty 250

## 2018-07-05 MED ORDER — PALONOSETRON HCL INJECTION 0.25 MG/5ML
0.2500 mg | Freq: Once | INTRAVENOUS | Status: AC
  Administered 2018-07-05: 0.25 mg via INTRAVENOUS

## 2018-07-05 MED ORDER — IRINOTECAN HCL CHEMO INJECTION 100 MG/5ML
150.0000 mg/m2 | Freq: Once | INTRAVENOUS | Status: AC
  Administered 2018-07-05: 320 mg via INTRAVENOUS
  Filled 2018-07-05: qty 15

## 2018-07-05 MED ORDER — DEXTROSE 5 % IV SOLN
Freq: Once | INTRAVENOUS | Status: AC
  Administered 2018-07-05: 09:00:00 via INTRAVENOUS
  Filled 2018-07-05: qty 250

## 2018-07-05 MED ORDER — SODIUM CHLORIDE 0.9 % IV SOLN: 10.0000 mg | Freq: Once | INTRAVENOUS | Status: DC

## 2018-07-05 MED ORDER — SODIUM CHLORIDE 0.9 % IV SOLN
5000.0000 mg | INTRAVENOUS | Status: DC
  Administered 2018-07-05: 5000 mg via INTRAVENOUS
  Filled 2018-07-05: qty 100

## 2018-07-05 MED ORDER — PALONOSETRON HCL INJECTION 0.25 MG/5ML
INTRAVENOUS | Status: AC
  Filled 2018-07-05: qty 5

## 2018-07-05 MED ORDER — DEXAMETHASONE SODIUM PHOSPHATE 10 MG/ML IJ SOLN
INTRAMUSCULAR | Status: AC
  Filled 2018-07-05: qty 1

## 2018-07-05 MED ORDER — DEXAMETHASONE SODIUM PHOSPHATE 10 MG/ML IJ SOLN
10.0000 mg | Freq: Once | INTRAMUSCULAR | Status: AC
  Administered 2018-07-05: 10 mg via INTRAVENOUS

## 2018-07-05 NOTE — Patient Instructions (Signed)
[place] Discharge Instructions for Patients Receiving Chemotherapy  Today you received the following chemotherapy agents:  Oxaliplatin, Lecovorin, Irinotecan, and 5FU.  To help prevent nausea and vomiting after your treatment, we encourage you to take your nausea medication as directed.   If you develop nausea and vomiting that is not controlled by your nausea medication, call the clinic.   BELOW ARE SYMPTOMS THAT SHOULD BE REPORTED IMMEDIATELY:  *FEVER GREATER THAN 100.5 F  *CHILLS WITH OR WITHOUT FEVER  NAUSEA AND VOMITING THAT IS NOT CONTROLLED WITH YOUR NAUSEA MEDICATION  *UNUSUAL SHORTNESS OF BREATH  *UNUSUAL BRUISING OR BLEEDING  TENDERNESS IN MOUTH AND THROAT WITH OR WITHOUT PRESENCE OF ULCERS  *URINARY PROBLEMS  *BOWEL PROBLEMS  UNUSUAL RASH Items with * indicate a potential emergency and should be followed up as soon as possible.  Feel free to call the clinic should you have any questions or concerns. The clinic phone number is [phone].  Please show the [place] at check-in to the Emergency Department and triage nurse.   Oxaliplatin Injection What is this medicine? OXALIPLATIN (ox AL i PLA tin) is a chemotherapy drug. It targets fast dividing cells, like cancer cells, and causes these cells to die. This medicine is used to treat cancers of the colon and rectum, and many other cancers. This medicine may be used for other purposes; ask your health care provider or pharmacist if you have questions. COMMON BRAND NAME(S): Eloxatin What should I tell my health care provider before I take this medicine? They need to know if you have any of these conditions: -kidney disease -an unusual or allergic reaction to oxaliplatin, other chemotherapy, other medicines, foods, dyes, or preservatives -pregnant or trying to get pregnant -breast-feeding How should I use this medicine? This drug is given as an infusion into a vein. It is administered  in a hospital or clinic by a specially trained health care professional. Talk to your pediatrician regarding the use of this medicine in children. Special care may be needed. Overdosage: If you think you have taken too much of this medicine contact a poison control center or emergency room at once. NOTE: This medicine is only for you. Do not share this medicine with others. What if I miss a dose? It is important not to miss a dose. Call your doctor or health care professional if you are unable to keep an appointment. What may interact with this medicine? -medicines to increase blood counts like filgrastim, pegfilgrastim, sargramostim -probenecid -some antibiotics like amikacin, gentamicin, neomycin, polymyxin B, streptomycin, tobramycin -zalcitabine Talk to your doctor or health care professional before taking any of these medicines: -acetaminophen -aspirin -ibuprofen -ketoprofen -naproxen This list may not describe all possible interactions. Give your health care provider a list of all the medicines, herbs, non-prescription drugs, or dietary supplements you use. Also tell them if you smoke, drink alcohol, or use illegal drugs. Some items may interact with your medicine. What should I watch for while using this medicine? Your condition will be monitored carefully while you are receiving this medicine. You will need important blood work done while you are taking this medicine. This medicine can make you more sensitive to cold. Do not drink cold drinks or use ice. Cover exposed skin before coming in contact with cold temperatures or cold objects. When out in cold weather wear warm clothing and cover your mouth and nose to warm the air that goes into your lungs. Tell your doctor if you get sensitive  to the cold. This drug may make you feel generally unwell. This is not uncommon, as chemotherapy can affect healthy cells as well as cancer cells. Report any side effects. Continue your course of  treatment even though you feel ill unless your doctor tells you to stop. In some cases, you may be given additional medicines to help with side effects. Follow all directions for their use. Call your doctor or health care professional for advice if you get a fever, chills or sore throat, or other symptoms of a cold or flu. Do not treat yourself. This drug decreases your body's ability to fight infections. Try to avoid being around people who are sick. This medicine may increase your risk to bruise or bleed. Call your doctor or health care professional if you notice any unusual bleeding. Be careful brushing and flossing your teeth or using a toothpick because you may get an infection or bleed more easily. If you have any dental work done, tell your dentist you are receiving this medicine. Avoid taking products that contain aspirin, acetaminophen, ibuprofen, naproxen, or ketoprofen unless instructed by your doctor. These medicines may hide a fever. Do not become pregnant while taking this medicine. Women should inform their doctor if they wish to become pregnant or think they might be pregnant. There is a potential for serious side effects to an unborn child. Talk to your health care professional or pharmacist for more information. Do not breast-feed an infant while taking this medicine. Call your doctor or health care professional if you get diarrhea. Do not treat yourself. What side effects may I notice from receiving this medicine? Side effects that you should report to your doctor or health care professional as soon as possible: -allergic reactions like skin rash, itching or hives, swelling of the face, lips, or tongue -low blood counts - This drug may decrease the number of white blood cells, red blood cells and platelets. You may be at increased risk for infections and bleeding. -signs of infection - fever or chills, cough, sore throat, pain or difficulty passing urine -signs of decreased platelets  or bleeding - bruising, pinpoint red spots on the skin, black, tarry stools, nosebleeds -signs of decreased red blood cells - unusually weak or tired, fainting spells, lightheadedness -breathing problems -chest pain, pressure -cough -diarrhea -jaw tightness -mouth sores -nausea and vomiting -pain, swelling, redness or irritation at the injection site -pain, tingling, numbness in the hands or feet -problems with balance, talking, walking -redness, blistering, peeling or loosening of the skin, including inside the mouth -trouble passing urine or change in the amount of urine Side effects that usually do not require medical attention (report to your doctor or health care professional if they continue or are bothersome): -changes in vision -constipation -hair loss -loss of appetite -metallic taste in the mouth or changes in taste -stomach pain This list may not describe all possible side effects. Call your doctor for medical advice about side effects. You may report side effects to FDA at 1-800-FDA-1088. Where should I keep my medicine? This drug is given in a hospital or clinic and will not be stored at home. NOTE: This sheet is a summary. It may not cover all possible information. If you have questions about this medicine, talk to your doctor, pharmacist, or health care provider.  2018 Elsevier/Gold Standard (2008-02-17 17:22:47)  Leucovorin injection What is this medicine? LEUCOVORIN (loo koe VOR in) is used to prevent or treat the harmful effects of some medicines. This medicine is  used to treat anemia caused by a low amount of folic acid in the body. It is also used with 5-fluorouracil (5-FU) to treat colon cancer. This medicine may be used for other purposes; ask your health care provider or pharmacist if you have questions. What should I tell my health care provider before I take this medicine? They need to know if you have any of these conditions: -anemia from low levels of  vitamin B-12 in the blood -an unusual or allergic reaction to leucovorin, folic acid, other medicines, foods, dyes, or preservatives -pregnant or trying to get pregnant -breast-feeding How should I use this medicine? This medicine is for injection into a muscle or into a vein. It is given by a health care professional in a hospital or clinic setting. Talk to your pediatrician regarding the use of this medicine in children. Special care may be needed. Overdosage: If you think you have taken too much of this medicine contact a poison control center or emergency room at once. NOTE: This medicine is only for you. Do not share this medicine with others. What if I miss a dose? This does not apply. What may interact with this medicine? -capecitabine -fluorouracil -phenobarbital -phenytoin -primidone -trimethoprim-sulfamethoxazole This list may not describe all possible interactions. Give your health care provider a list of all the medicines, herbs, non-prescription drugs, or dietary supplements you use. Also tell them if you smoke, drink alcohol, or use illegal drugs. Some items may interact with your medicine. What should I watch for while using this medicine? Your condition will be monitored carefully while you are receiving this medicine. This medicine may increase the side effects of 5-fluorouracil, 5-FU. Tell your doctor or health care professional if you have diarrhea or mouth sores that do not get better or that get worse. What side effects may I notice from receiving this medicine? Side effects that you should report to your doctor or health care professional as soon as possible: -allergic reactions like skin rash, itching or hives, swelling of the face, lips, or tongue -breathing problems -fever, infection -mouth sores -unusual bleeding or bruising -unusually weak or tired Side effects that usually do not require medical attention (report to your doctor or health care professional if  they continue or are bothersome): -constipation or diarrhea -loss of appetite -nausea, vomiting This list may not describe all possible side effects. Call your doctor for medical advice about side effects. You may report side effects to FDA at 1-800-FDA-1088. Where should I keep my medicine? This drug is given in a hospital or clinic and will not be stored at home. NOTE: This sheet is a summary. It may not cover all possible information. If you have questions about this medicine, talk to your doctor, pharmacist, or health care provider.  2018 Elsevier/Gold Standard (2008-01-27 16:50:29)  Irinotecan injection What is this medicine? IRINOTECAN (ir in oh TEE kan ) is a chemotherapy drug. It is used to treat colon and rectal cancer. This medicine may be used for other purposes; ask your health care provider or pharmacist if you have questions. COMMON BRAND NAME(S): Camptosar What should I tell my health care provider before I take this medicine? They need to know if you have any of these conditions: -blood disorders -dehydration -diarrhea -infection (especially a virus infection such as chickenpox, cold sores, or herpes) -liver disease -low blood counts, like low white cell, platelet, or red cell counts -recent or ongoing radiation therapy -an unusual or allergic reaction to irinotecan, sorbitol, other chemotherapy,  other medicines, foods, dyes, or preservatives -pregnant or trying to get pregnant -breast-feeding How should I use this medicine? This drug is given as an infusion into a vein. It is administered in a hospital or clinic by a specially trained health care professional. Talk to your pediatrician regarding the use of this medicine in children. Special care may be needed. Overdosage: If you think you have taken too much of this medicine contact a poison control center or emergency room at once. NOTE: This medicine is only for you. Do not share this medicine with others. What if  I miss a dose? It is important not to miss your dose. Call your doctor or health care professional if you are unable to keep an appointment. What may interact with this medicine? Do not take this medicine with any of the following medications: -atazanavir -certain medicines for fungal infections like itraconazole and ketoconazole -St. John's Wort This medicine may also interact with the following medications: -dexamethasone -diuretics -laxatives -medicines for seizures like carbamazepine, mephobarbital, phenobarbital, phenytoin, primidone -medicines to increase blood counts like filgrastim, pegfilgrastim, sargramostim -prochlorperazine -vaccines This list may not describe all possible interactions. Give your health care provider a list of all the medicines, herbs, non-prescription drugs, or dietary supplements you use. Also tell them if you smoke, drink alcohol, or use illegal drugs. Some items may interact with your medicine. What should I watch for while using this medicine? Your condition will be monitored carefully while you are receiving this medicine. You will need important blood work done while you are taking this medicine. This drug may make you feel generally unwell. This is not uncommon, as chemotherapy can affect healthy cells as well as cancer cells. Report any side effects. Continue your course of treatment even though you feel ill unless your doctor tells you to stop. In some cases, you may be given additional medicines to help with side effects. Follow all directions for their use. You may get drowsy or dizzy. Do not drive, use machinery, or do anything that needs mental alertness until you know how this medicine affects you. Do not stand or sit up quickly, especially if you are an older patient. This reduces the risk of dizzy or fainting spells. Call your doctor or health care professional for advice if you get a fever, chills or sore throat, or other symptoms of a cold or flu.  Do not treat yourself. This drug decreases your body's ability to fight infections. Try to avoid being around people who are sick. This medicine may increase your risk to bruise or bleed. Call your doctor or health care professional if you notice any unusual bleeding. Be careful brushing and flossing your teeth or using a toothpick because you may get an infection or bleed more easily. If you have any dental work done, tell your dentist you are receiving this medicine. Avoid taking products that contain aspirin, acetaminophen, ibuprofen, naproxen, or ketoprofen unless instructed by your doctor. These medicines may hide a fever. Do not become pregnant while taking this medicine. Women should inform their doctor if they wish to become pregnant or think they might be pregnant. There is a potential for serious side effects to an unborn child. Talk to your health care professional or pharmacist for more information. Do not breast-feed an infant while taking this medicine. What side effects may I notice from receiving this medicine? Side effects that you should report to your doctor or health care professional as soon as possible: -allergic reactions like skin  rash, itching or hives, swelling of the face, lips, or tongue -low blood counts - this medicine may decrease the number of white blood cells, red blood cells and platelets. You may be at increased risk for infections and bleeding. -signs of infection - fever or chills, cough, sore throat, pain or difficulty passing urine -signs of decreased platelets or bleeding - bruising, pinpoint red spots on the skin, black, tarry stools, blood in the urine -signs of decreased red blood cells - unusually weak or tired, fainting spells, lightheadedness -breathing problems -chest pain -diarrhea -feeling faint or lightheaded, falls -flushing, runny nose, sweating during infusion -mouth sores or pain -pain, swelling, redness or irritation where injected -pain,  swelling, warmth in the leg -pain, tingling, numbness in the hands or feet -problems with balance, talking, walking -stomach cramps, pain -trouble passing urine or change in the amount of urine -vomiting as to be unable to hold down drinks or food -yellowing of the eyes or skinFluorouracil, 5-FU injection What is this medicine? FLUOROURACIL, 5-FU (flure oh YOOR a sil) is a chemotherapy drug. It slows the growth of cancer cells. This medicine is used to treat many types of cancer like breast cancer, colon or rectal cancer, pancreatic cancer, and stomach cancer. This medicine may be used for other purposes; ask your health care provider or pharmacist if you have questions. COMMON BRAND NAME(S): Adrucil What should I tell my health care provider before I take this medicine? They need to know if you have any of these conditions: -blood disorders -dihydropyrimidine dehydrogenase (DPD) deficiency -infection (especially a virus infection such as chickenpox, cold sores, or herpes) -kidney disease -liver disease -malnourished, poor nutrition -recent or ongoing radiation therapy -an unusual or allergic reaction to fluorouracil, other chemotherapy, other medicines, foods, dyes, or preservatives -pregnant or trying to get pregnant -breast-feeding How should I use this medicine? This drug is given as an infusion or injection into a vein. It is administered in a hospital or clinic by a specially trained health care professional. Talk to your pediatrician regarding the use of this medicine in children. Special care may be needed. Overdosage: If you think you have taken too much of this medicine contact a poison control center or emergency room at once. NOTE: This medicine is only for you. Do not share this medicine with others. What if I miss a dose? It is important not to miss your dose. Call your doctor or health care professional if you are unable to keep an appointment. What may interact with this  medicine? -allopurinol -cimetidine -dapsone -digoxin -hydroxyurea -leucovorin -levamisole -medicines for seizures like ethotoin, fosphenytoin, phenytoin -medicines to increase blood counts like filgrastim, pegfilgrastim, sargramostim -medicines that treat or prevent blood clots like warfarin, enoxaparin, and dalteparin -methotrexate -metronidazole -pyrimethamine -some other chemotherapy drugs like busulfan, cisplatin, estramustine, vinblastine -trimethoprim -trimetrexate -vaccines Talk to your doctor or health care professional before taking any of these medicines: -acetaminophen -aspirin -ibuprofen -ketoprofen -naproxen This list may not describe all possible interactions. Give your health care provider a list of all the medicines, herbs, non-prescription drugs, or dietary supplements you use. Also tell them if you smoke, drink alcohol, or use illegal drugs. Some items may interact with your medicine. What should I watch for while using this medicine? Visit your doctor for checks on your progress. This drug may make you feel generally unwell. This is not uncommon, as chemotherapy can affect healthy cells as well as cancer cells. Report any side effects. Continue your course of treatment even though  you feel ill unless your doctor tells you to stop. In some cases, you may be given additional medicines to help with side effects. Follow all directions for their use. Call your doctor or health care professional for advice if you get a fever, chills or sore throat, or other symptoms of a cold or flu. Do not treat yourself. This drug decreases your body's ability to fight infections. Try to avoid being around people who are sick. This medicine may increase your risk to bruise or bleed. Call your doctor or health care professional if you notice any unusual bleeding. Be careful brushing and flossing your teeth or using a toothpick because you may get an infection or bleed more easily. If you  have any dental work done, tell your dentist you are receiving this medicine. Avoid taking products that contain aspirin, acetaminophen, ibuprofen, naproxen, or ketoprofen unless instructed by your doctor. These medicines may hide a fever. Do not become pregnant while taking this medicine. Women should inform their doctor if they wish to become pregnant or think they might be pregnant. There is a potential for serious side effects to an unborn child. Talk to your health care professional or pharmacist for more information. Do not breast-feed an infant while taking this medicine. Men should inform their doctor if they wish to father a child. This medicine may lower sperm counts. Do not treat diarrhea with over the counter products. Contact your doctor if you have diarrhea that lasts more than 2 days or if it is severe and watery. This medicine can make you more sensitive to the sun. Keep out of the sun. If you cannot avoid being in the sun, wear protective clothing and use sunscreen. Do not use sun lamps or tanning beds/booths. What side effects may I notice from receiving this medicine? Side effects that you should report to your doctor or health care professional as soon as possible: -allergic reactions like skin rash, itching or hives, swelling of the face, lips, or tongue -low blood counts - this medicine may decrease the number of white blood cells, red blood cells and platelets. You may be at increased risk for infections and bleeding. -signs of infection - fever or chills, cough, sore throat, pain or difficulty passing urine -signs of decreased platelets or bleeding - bruising, pinpoint red spots on the skin, black, tarry stools, blood in the urine -signs of decreased red blood cells - unusually weak or tired, fainting spells, lightheadedness -breathing problems -changes in vision -chest pain -mouth sores -nausea and vomiting -pain, swelling, redness at site where injected -pain, tingling,  numbness in the hands or feet -redness, swelling, or sores on hands or feet -stomach pain -unusual bleeding Side effects that usually do not require medical attention (report to your doctor or health care professional if they continue or are bothersome): -changes in finger or toe nails -diarrhea -dry or itchy skin -hair loss -headache -loss of appetite -sensitivity of eyes to the light -stomach upset -unusually teary eyes This list may not describe all possible side effects. Call your doctor for medical advice about side effects. You may report side effects to FDA at 1-800-FDA-1088. Where should I keep my medicine? This drug is given in a hospital or clinic and will not be stored at home. NOTE: This sheet is a summary. It may not cover all possible information. If you have questions about this medicine, talk to your doctor, pharmacist, or health care provider.  2018 Elsevier/Gold Standard (2007-11-26 13:53:16)  Side effects  that usually do not require medical attention (report to your doctor or health care professional if they continue or are bothersome): -constipation -hair loss -headache -loss of appetite -nausea, vomiting -stomach upset This list may not describe all possible side effects. Call your doctor for medical advice about side effects. You may report side effects to FDA at 1-800-FDA-1088. Where should I keep my medicine? This drug is given in a hospital or clinic and will not be stored at home. NOTE: This sheet is a summary. It may not cover all possible information. If you have questions about this medicine, talk to your doctor, pharmacist, or health care provider.  2018 Elsevier/Gold Standard (2013-01-19 16:29:32)

## 2018-07-05 NOTE — Progress Notes (Signed)
Per Larene Beach in pharmacy OK to run Leucovorin over 1.5 hours.

## 2018-07-06 NOTE — Progress Notes (Signed)
[place]  [phone] [phone]  Clinic Follow up Note   Patient Care Team: Street, [name], MD as PCP - General (Family Medicine) 07/07/2018  Chief Complaint: F/u on pancreatic cancer  SUMMARY OF ONCOLOGIC HISTORY: Oncology History   Cancer Staging Adenocarcinoma of pancreas [facility]) Staging form: Exocrine Pancreas, AJCC 8th Edition - Clinical stage from 06/10/2018: Stage IB (cT2, cN0, cM0) - Signed by [name], MD on 06/29/2018       Adenocarcinoma of pancreas ([place])   06/07/2018 Imaging    CT AP w Contrast IMPRESSION: 1. There is a low attenuation mass centered around the neck of pancreas which is concerning for neoplasm. Pancreatic adenocarcinoma favored. This results in common bile duct obstruction with mild intrahepatic biliary ductal dilatation. There also is involvement of the portal venous confluence. Further evaluation with nonemergent contrast enhanced MRI of the pancreas is recommended. 2. Small indeterminate low-attenuation structure is noted within segment 4 a of the liver. This could be better addressed at MRI.    06/09/2018 Imaging    MR ABD MRCP  The pancreatic mass involves approximately 40 percent of the main portal vein circumference at the portal splenic venous confluence, with associated mild narrowing of the portal splenic venous confluence. The SMV, splenic vein and main, right and left portal veins remain patent. The celiac trunk and SMA are not involved by the pancreatic mass.  IMPRESSION: 2.3 cm diameter hypoenhancing mass at the junction of the head and neck of pancreas with pancreatic ductal dilatation, likely adenocarcinoma.  Intra and extrahepatic bile duct dilatation with abrupt change in caliber at the mid common bile duct. This could indicate an obstructing mass or stricture.     06/10/2018 Initial Biopsy    Diagnosis PANCREAS, FINE NEEDLE ASPIRATION (SPECIMEN 1 OF 1 COLLECTED 06/10/18): MALIGNANT  CELLS CONSISTENT WITH ADENOCARCINOMA.    06/10/2018 Procedure    IMPRESSION: 1. 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 2. Placement of a metallic biliary stent.    06/10/2018 Procedure    EUS per Dr. Paulita Fujita Impression:  - There was dilation in the common bile duct which measured up to 12 mm. - A mass was identified in the pancreatic head. This was staged T3 N1 Mx by endosonographic criteria. Fine needle aspiration performed. - A few lymph nodes were visualized and measured in the peripancreatic region. - There was no evidence of significant pathology in the left lobe of the liver.    06/10/2018 Cancer Staging    Staging form: Exocrine Pancreas, AJCC 8th Edition - Clinical stage from 06/10/2018: Stage IB (cT2, cN0, cM0) - Signed by [name], MD on 06/29/2018    06/27/2018 Initial Diagnosis    Adenocarcinoma of pancreas ([place])    07/02/2018 Imaging    IMPRESSION: 1. Multiple small pulmonary nodules scattered throughout the lungs measuring up to 7 mm in size. These are nonspecific, but the possibility of metastatic disease should be considered, and close attention at time of routine followups is recommended. 2. In addition, today's study demonstrates new and enlarging low-attenuation lesions in the liver. This is poorly evaluated on today's noncontrast CT examination, but is concerning for potential metastatic disease. Further evaluation with repeat nonemergent MRI of the abdomen with and without IV gadolinium is suggested in the near future to better evaluate these findings. 3. Aortic atherosclerosis, in addition to left main and 2 vessel coronary artery disease. Please note that although the presence of coronary  artery calcium documents the presence of coronary artery disease, the severity of this disease and any potential stenosis cannot be assessed on this non-gated CT examination. Assessment for potential risk factor  modification, dietary therapy or pharmacologic therapy may be warranted, if clinically indicated. 4. Mild aneurysmal dilatation of the ascending thoracic aorta (4.8 cm in diameter). Ascending thoracic aortic aneurysm. Recommend semi-annual imaging followup by CTA or MRA and referral to cardiothoracic surgery if not already obtained. This recommendation follows 2010 ACCF/AHA/AATS/ACR/ASA/SCA/SCAI/SIR/STS/SVM Guidelines for the Diagnosis and Management of Patients With Thoracic Aortic Disease. Circulation. 2010; 121: Z009-Q330.  Aortic Atherosclerosis (ICD10-I70.0). Aortic aneurysm NOS (ICD10-I71.9).    07/04/2018 -  Chemotherapy     FOLFIRINOX q2 weeks    CURRENT THERAPY FOLFIRINOX q2 weeks, started on 07/05/2018  INTERVAL HISTORY: Ernest Wu is a 57 y.o. male who is here for follow-up. He is here for chemo pump disconnection, and review his recent CT scan.  He is accompanied by his wife to the clinic today.  He started first cycle chemotherapy 2 days ago, has been tolerating well so far, had a mild fatigue, low appetite, some cold sensitivity, no significant nausea, vomiting or diarrhea.  He noticed some chest tightness, which started around 1:00am last night, he described as a heaviness, in the left front chest, radiates to his neck, no dyspnea or cough.  It has been there most the time, with some intermittent relief.  He did not sleep much after he woke up with chest discomfort this morning, feels more fatigued today.  Pertinent positives and negatives of review of systems are listed and detailed within the above HPI.  REVIEW OF SYSTEMS:   Constitutional: Denies fevers, chills or abnormal weight loss Eyes: Denies blurriness of vision, (+) fatigue  Ears, nose, mouth, throat, and face: Denies mucositis or sore throat Respiratory: Denies cough, dyspnea or wheezes Cardiovascular: Denies palpitation, (+) chest heaviness since 1am today, no lower extremity swelling Gastrointestinal:   Denies nausea, heartburn or change in bowel habits Skin: Denies abnormal skin rashes Lymphatics: Denies new lymphadenopathy or easy bruising Neurological:Denies numbness, tingling or new weaknesses Behavioral/Psych: Mood is stable, no new changes  All other systems were reviewed with the patient and are negative.  MEDICAL HISTORY:  Past Medical History:  Diagnosis Date  . Cancer [facility])    Pancreatic  . GERD (gastroesophageal reflux disease)     SURGICAL HISTORY: Past Surgical History:  Procedure Laterality Date  . BILIARY STENT PLACEMENT  06/10/2018   Procedure: BILIARY STENT PLACEMENT;  Surgeon: Ronnette Juniper, MD;  Location: [facility] ENDOSCOPY;  Service: Gastroenterology;;  . ERCP N/A 06/10/2018   Procedure: ENDOSCOPIC RETROGRADE CHOLANGIOPANCREATOGRAPHY (ERCP);  Surgeon: Ronnette Juniper, MD;  Location: [place];  Service: Gastroenterology;  Laterality: N/A;  . ESOPHAGOGASTRODUODENOSCOPY (EGD) WITH PROPOFOL N/A 06/10/2018   Procedure: ESOPHAGOGASTRODUODENOSCOPY (EGD) WITH PROPOFOL;  Surgeon: Ronnette Juniper, MD;  Location: [place];  Service: Gastroenterology;  Laterality: N/A;  . FINE NEEDLE ASPIRATION  06/10/2018   Procedure: FINE NEEDLE ASPIRATION (FNA) LINEAR;  Surgeon: Ronnette Juniper, MD;  Location: [facility] ENDOSCOPY;  Service: Gastroenterology;;  . Sol Passer PLACEMENT N/A 07/02/2018   Procedure: INSERTION PORT-A-CATH;  Surgeon: Stark Klein, MD;  Location: [place];  Service: General;  Laterality: N/A;  . SPHINCTEROTOMY  06/10/2018   Procedure: SPHINCTEROTOMY;  Surgeon: Ronnette Juniper, MD;  Location: [facility] ENDOSCOPY;  Service: Gastroenterology;;  . UPPER ESOPHAGEAL ENDOSCOPIC ULTRASOUND (EUS) N/A 06/10/2018   Procedure: UPPER ESOPHAGEAL ENDOSCOPIC ULTRASOUND (EUS);  Surgeon: Ronnette Juniper, MD;  Location: [place];  Service: Gastroenterology;  Laterality: N/A;  . VASECTOMY      I have reviewed the social history and family history with the patient and they are unchanged from previous note.  ALLERGIES:   is allergic to contrast media [iodinated diagnostic agents] and iohexol.  MEDICATIONS:  Current Outpatient Medications  Medication Sig Dispense Refill  . diphenoxylate-atropine (LOMOTIL) 2.5-0.025 MG tablet Take 2 tablets by mouth 4 (four) times daily as needed for diarrhea or loose stools. 40 tablet 0  . esomeprazole (NEXIUM) 20 MG capsule Take 20 mg by mouth daily.     . folic acid (FOLVITE) 1 MG tablet Take 1 tablet (1 mg total) by mouth daily. 30 tablet 0  . ibuprofen (ADVIL,MOTRIN) 200 MG tablet Take 400 mg by mouth daily as needed for headache or moderate pain.     Marland Kitchen lidocaine-prilocaine (EMLA) cream Apply to affected area once 30 g 3  . ondansetron (ZOFRAN) 8 MG tablet Take 1 tablet (8 mg total) by mouth 2 (two) times daily as needed for refractory nausea / vomiting. Start on day 3 after chemotherapy. 30 tablet 1  . oxyCODONE (OXY IR/ROXICODONE) 5 MG immediate release tablet Take 1 tablet (5 mg total) by mouth every 6 (six) hours as needed for severe pain. 20 tablet 0  . prochlorperazine (COMPAZINE) 10 MG tablet Take 1 tablet (10 mg total) by mouth every 6 (six) hours as needed (NAUSEA). 30 tablet 1  . thiamine 100 MG tablet Take 1 tablet (100 mg total) by mouth daily. 30 tablet 0   No current facility-administered medications for this visit.     PHYSICAL EXAMINATION: ECOG PERFORMANCE STATUS: 1 - Symptomatic but completely ambulatory  Vitals:   07/07/18 1223 07/07/18 1225  BP: (!) 135/94 (!) 144/88  Pulse: 66   Resp: 18   Temp: 98.1 F (36.7 C)   SpO2: 98%    Filed Weights   07/07/18 1223  Weight: 197 lb 9.6 oz (89.6 kg)    GENERAL:alert, no distress and comfortable SKIN: skin color, texture, turgor are normal, no rashes or significant lesions EYES: normal, Conjunctiva are pink and non-injected, sclera clear OROPHARYNX:no exudate, no erythema and lips, buccal mucosa, and tongue normal  NECK: supple, thyroid normal size, non-tender, without nodularity LYMPH:  no palpable  lymphadenopathy in the cervical, axillary or inguinal LUNGS: clear to auscultation and percussion with normal breathing effort HEART: regular rate & rhythm and no murmurs and no lower extremity edema ABDOMEN:abdomen soft, non-tender and normal bowel sounds Musculoskeletal:no cyanosis of digits and no clubbing  NEURO: alert & oriented x 3 with fluent speech, no focal motor/sensory deficits  LABORATORY DATA:  I have reviewed the data as listed CBC Latest Ref Rng & Units 07/04/2018 06/27/2018 06/11/2018  WBC 4.0 - 10.5 K/uL 5.0 5.3 8.9  Hemoglobin 13.0 - 17.0 g/dL 13.9 14.7 13.1  Hematocrit 39.0 - 52.0 % 42.2 44.2 40.5  Platelets 150 - 400 K/uL 141(L) 189 155     CMP Latest Ref Rng & Units 07/04/2018 06/27/2018 06/11/2018  Glucose 70 - 99 mg/dL 109(H) 101(H) 115(H)  BUN 6 - 20 mg/dL 8 6 7   Creatinine 0.61 - 1.24 mg/dL 0.75 0.82 0.77  Sodium 135 - 145 mmol/L 142 142 138  Potassium 3.5 - 5.1 mmol/L 4.0 4.2 3.7  Chloride 98 - 111 mmol/L 106 106 106  CO2 22 - 32 mmol/L 27 26 27   Calcium 8.9 - 10.3 mg/dL 9.4 9.7 8.5(L)  Total Protein 6.5 - 8.1 g/dL 6.8 7.0 5.7(L)  Total Bilirubin 0.3 - 1.2 mg/dL 1.0 1.5(H) 4.4(H)  Alkaline Phos 38 - 126 U/L 108 125 218(H)  AST 15 - 41 U/L 22 22 197(H)  ALT 0 - 44 U/L 44 56(H) 599(H)      RADIOGRAPHIC STUDIES: I have personally reviewed the radiological images as listed and agreed with the findings in the report. No results found.   ASSESSMENT & PLAN:   Ernest Wu is a 57 y.o. male with history of  1. Adenocarcinoma of the pancreas, cT3N1Mx, borderline resectable,possble liver metastasis  -He presented with jaundice, status post MRCP and stent placement, jaundice has resolved now.   -His initial CT of abdomen and MRI showed an small indeterminate liver lesion, no other metastasis.  The pancreatic mass was borderline double due to the vascular invasion. -I discussed his CT chest findings, images reviewed with pt and his wife in person.   Unfortunately it showed 2 liver lesions, one is new, once enlarging from previous CT scan, highly concerning for liver metastasis. -I recommend a liver biopsy, if it is feasible.  We will discuss with interventional radiology -He has started chemo FOLFIRINOX 2 days ago, will continue  -He tolerated the first cycle well so far   2. Chest heaviness -Started around 1 AM this morning, still ongoing, remittent.  Significant chest pain, or dyspnea.  He had no history of coronary artery disease or other heart history in the past. -Patient appears to be anxious per his wife. -EKG was obtained in the clinic before and after pump DC, slightly ST change on V2, no other concerns. I reviewed the EKG with on-call cardiologist Dr. Ellyn Hack over the phone. -I discussed 5-FU induced coronary artery spasm, although it is not supported by his EKG. -I observed him in the clinic for 30 minutes, his chest heaviness most resolved after pump DC, will discharge him home. He knows to go to emergency room if he has recurrent chest heaviness or pain.  3 Alcohol and smoking -I previously encouraged him to quit completely. He is willing to try -he has quit alcohol, on folic acid supplement   Plan  5-fu pump d/c today with Udenyca  -two EKG reviewed by myself and with on-call cardiologist  -CT scan reviewed, will discuss with IR to see if they can biopsy his liver lesion next week -he will return next Thursday for cycle 2 chemo    No problem-specific Assessment & Plan notes found for this encounter.   No orders of the defined types were placed in this encounter.  All questions were answered. The patient knows to call the clinic with any problems, questions or concerns. No barriers to learning was detected. I spent 30 minutes counseling the patient face to face. The total time spent in the appointment was 40 minutes and more than 50% was on counseling and review of test results  I, Noor Dweik am acting as scribe for  Dr. [name].  I have reviewed the above documentation for accuracy and completeness, and I agree with the above.      [name], MD 07/07/2018

## 2018-07-07 ENCOUNTER — Inpatient Hospital Stay: Payer: Managed Care, Other (non HMO) | Admitting: Nutrition

## 2018-07-07 ENCOUNTER — Encounter: Payer: Self-pay | Admitting: Hematology

## 2018-07-07 ENCOUNTER — Other Ambulatory Visit: Payer: Self-pay

## 2018-07-07 ENCOUNTER — Inpatient Hospital Stay: Payer: Managed Care, Other (non HMO) | Attending: Hematology

## 2018-07-07 ENCOUNTER — Inpatient Hospital Stay (HOSPITAL_BASED_OUTPATIENT_CLINIC_OR_DEPARTMENT_OTHER): Payer: Managed Care, Other (non HMO) | Admitting: Hematology

## 2018-07-07 VITALS — BP 144/88 | HR 66 | Temp 98.1°F | Resp 18 | Ht 72.0 in | Wt 197.6 lb

## 2018-07-07 DIAGNOSIS — I700 Atherosclerosis of aorta: Secondary | ICD-10-CM

## 2018-07-07 DIAGNOSIS — F17210 Nicotine dependence, cigarettes, uncomplicated: Secondary | ICD-10-CM | POA: Diagnosis not present

## 2018-07-07 DIAGNOSIS — Z452 Encounter for adjustment and management of vascular access device: Secondary | ICD-10-CM | POA: Diagnosis not present

## 2018-07-07 DIAGNOSIS — R252 Cramp and spasm: Secondary | ICD-10-CM | POA: Diagnosis not present

## 2018-07-07 DIAGNOSIS — C259 Malignant neoplasm of pancreas, unspecified: Secondary | ICD-10-CM

## 2018-07-07 DIAGNOSIS — R203 Hyperesthesia: Secondary | ICD-10-CM | POA: Insufficient documentation

## 2018-07-07 DIAGNOSIS — R0789 Other chest pain: Secondary | ICD-10-CM

## 2018-07-07 DIAGNOSIS — K219 Gastro-esophageal reflux disease without esophagitis: Secondary | ICD-10-CM | POA: Diagnosis not present

## 2018-07-07 DIAGNOSIS — Z791 Long term (current) use of non-steroidal anti-inflammatories (NSAID): Secondary | ICD-10-CM

## 2018-07-07 DIAGNOSIS — R630 Anorexia: Secondary | ICD-10-CM | POA: Insufficient documentation

## 2018-07-07 DIAGNOSIS — K769 Liver disease, unspecified: Secondary | ICD-10-CM | POA: Diagnosis not present

## 2018-07-07 DIAGNOSIS — Z95828 Presence of other vascular implants and grafts: Secondary | ICD-10-CM | POA: Insufficient documentation

## 2018-07-07 DIAGNOSIS — R110 Nausea: Secondary | ICD-10-CM | POA: Insufficient documentation

## 2018-07-07 DIAGNOSIS — K5900 Constipation, unspecified: Secondary | ICD-10-CM | POA: Insufficient documentation

## 2018-07-07 DIAGNOSIS — Z79899 Other long term (current) drug therapy: Secondary | ICD-10-CM | POA: Diagnosis not present

## 2018-07-07 DIAGNOSIS — I2510 Atherosclerotic heart disease of native coronary artery without angina pectoris: Secondary | ICD-10-CM | POA: Diagnosis not present

## 2018-07-07 DIAGNOSIS — T451X5S Adverse effect of antineoplastic and immunosuppressive drugs, sequela: Secondary | ICD-10-CM | POA: Insufficient documentation

## 2018-07-07 DIAGNOSIS — R5383 Other fatigue: Secondary | ICD-10-CM | POA: Diagnosis not present

## 2018-07-07 DIAGNOSIS — I712 Thoracic aortic aneurysm, without rupture: Secondary | ICD-10-CM | POA: Insufficient documentation

## 2018-07-07 DIAGNOSIS — Z5111 Encounter for antineoplastic chemotherapy: Secondary | ICD-10-CM | POA: Diagnosis not present

## 2018-07-07 DIAGNOSIS — D6959 Other secondary thrombocytopenia: Secondary | ICD-10-CM | POA: Diagnosis not present

## 2018-07-07 DIAGNOSIS — R918 Other nonspecific abnormal finding of lung field: Secondary | ICD-10-CM | POA: Diagnosis not present

## 2018-07-07 DIAGNOSIS — Z7689 Persons encountering health services in other specified circumstances: Secondary | ICD-10-CM | POA: Diagnosis not present

## 2018-07-07 DIAGNOSIS — C257 Malignant neoplasm of other parts of pancreas: Secondary | ICD-10-CM

## 2018-07-07 MED ORDER — SODIUM CHLORIDE 0.9% FLUSH
10.0000 mL | INTRAVENOUS | Status: DC | PRN
  Filled 2018-07-07: qty 10

## 2018-07-07 MED ORDER — ALPRAZOLAM 0.25 MG PO TABS: 0.2500 mg | ORAL_TABLET | Freq: Every evening | ORAL | 0 refills | Status: DC | PRN

## 2018-07-07 MED ORDER — PEGFILGRASTIM-CBQV 6 MG/0.6ML ~~LOC~~ SOSY
6.0000 mg | PREFILLED_SYRINGE | Freq: Once | SUBCUTANEOUS | Status: AC
  Administered 2018-07-07: 6 mg via SUBCUTANEOUS

## 2018-07-07 MED ORDER — HEPARIN SOD (PORK) LOCK FLUSH 100 UNIT/ML IV SOLN
500.0000 [IU] | Freq: Once | INTRAVENOUS | Status: DC | PRN
  Filled 2018-07-07: qty 5

## 2018-07-07 MED ORDER — FOLIC ACID 1 MG PO TABS: 1.0000 mg | ORAL_TABLET | Freq: Every day | ORAL | 3 refills | Status: AC

## 2018-07-07 MED ORDER — PEGFILGRASTIM-CBQV 6 MG/0.6ML ~~LOC~~ SOSY
PREFILLED_SYRINGE | SUBCUTANEOUS | Status: AC
  Filled 2018-07-07: qty 0.6

## 2018-07-07 NOTE — Progress Notes (Signed)
Patient is a 57 year old male diagnosed with pancreas cancer.  He is a patient of Dr. Burr Medico.  Past medical history includes GERD, alcohol and tobacco usage.  Medications include Lomotil, Nexium, multivitamin, Zofran, Compazine, and thiamine.  Labs include glucose 109.  Height: 6 feet 0 inches. Weight: 197.4 pounds today. Usual body weight per patient 200 pounds. BMI: 26.8.  He states he may have had nausea but he is having a hard time distinguishing if it is nausea or not.  He denies vomiting. Patient denies diarrhea or constipation. He complains of tightness in his chest area "around his heart". He denies acid reflux.  He has been taking his Nexium.  Nutrition diagnosis: Food and nutrition related knowledge deficit related to new diagnosis of pancreas cancer and associated treatments as evidenced by no prior need for nutrition related information.  Intervention: Patient was educated to consume smaller amounts of food more often with a source of protein at each meal and snack. Discouraged weight loss during treatment. Recommended patient can try oral nutrition supplements and provided samples. Educated patient he may not tolerate high fat meals and to monitor tolerance of high fat foods. Spoke with RN to let her know of patient's tightness in his chest.  He is scheduled to see his physician today.  Monitoring, evaluation, goals: Patient will tolerate adequate calories and protein to minimize weight loss throughout treatment.   Next visit: Thursday, December 12 during infusion.  **Disclaimer: This note was dictated with voice recognition software. Similar sounding words can inadvertently be transcribed and this note may contain transcription errors which may not have been corrected upon publication of note.**

## 2018-07-07 NOTE — Progress Notes (Signed)
  Oncology Nurse Navigator Documentation  Called and spoke with patient and his wife to assess for education and emotional needs.  Was able to process the news that he has suspicious spots on his liver. Wife is overwhelmed and anxious about the bx. Reviewed use of PRN medications for nausea.  Encouraged them to call with questions or concerns.

## 2018-07-07 NOTE — Patient Instructions (Signed)
Pegfilgrastim injection What is this medicine? PEGFILGRASTIM (PEG fil gra stim) is a long-acting granulocyte colony-stimulating factor that stimulates the growth of neutrophils, a type of white blood cell important in the body's fight against infection. It is used to reduce the incidence of fever and infection in patients with certain types of cancer who are receiving chemotherapy that affects the bone marrow, and to increase survival after being exposed to high doses of radiation. This medicine may be used for other purposes; ask your health care provider or pharmacist if you have questions. COMMON BRAND NAME(S): Neulasta What should I tell my health care provider before I take this medicine? They need to know if you have any of these conditions: -kidney disease -latex allergy -ongoing radiation therapy -sickle cell disease -skin reactions to acrylic adhesives (On-Body Injector only) -an unusual or allergic reaction to pegfilgrastim, filgrastim, other medicines, foods, dyes, or preservatives -pregnant or trying to get pregnant -breast-feeding How should I use this medicine? This medicine is for injection under the skin. If you get this medicine at home, you will be taught how to prepare and give the pre-filled syringe or how to use the On-body Injector. Refer to the patient Instructions for Use for detailed instructions. Use exactly as directed. Tell your healthcare provider immediately if you suspect that the On-body Injector may not have performed as intended or if you suspect the use of the On-body Injector resulted in a missed or partial dose. It is important that you put your used needles and syringes in a special sharps container. Do not put them in a trash can. If you do not have a sharps container, call your pharmacist or healthcare provider to get one. Talk to your pediatrician regarding the use of this medicine in children. While this drug may be prescribed for selected conditions,  precautions do apply. Overdosage: If you think you have taken too much of this medicine contact a poison control center or emergency room at once. NOTE: This medicine is only for you. Do not share this medicine with others. What if I miss a dose? It is important not to miss your dose. Call your doctor or health care professional if you miss your dose. If you miss a dose due to an On-body Injector failure or leakage, a new dose should be administered as soon as possible using a single prefilled syringe for manual use. What may interact with this medicine? Interactions have not been studied. Give your health care provider a list of all the medicines, herbs, non-prescription drugs, or dietary supplements you use. Also tell them if you smoke, drink alcohol, or use illegal drugs. Some items may interact with your medicine. This list may not describe all possible interactions. Give your health care provider a list of all the medicines, herbs, non-prescription drugs, or dietary supplements you use. Also tell them if you smoke, drink alcohol, or use illegal drugs. Some items may interact with your medicine. What should I watch for while using this medicine? You may need blood work done while you are taking this medicine. If you are going to need a MRI, CT scan, or other procedure, tell your doctor that you are using this medicine (On-Body Injector only). What side effects may I notice from receiving this medicine? Side effects that you should report to your doctor or health care professional as soon as possible: -allergic reactions like skin rash, itching or hives, swelling of the face, lips, or tongue -dizziness -fever -pain, redness, or irritation at site   where injected -pinpoint red spots on the skin -red or dark-brown urine -shortness of breath or breathing problems -stomach or side pain, or pain at the shoulder -swelling -tiredness -trouble passing urine or change in the amount of urine Side  effects that usually do not require medical attention (report to your doctor or health care professional if they continue or are bothersome): -bone pain -muscle pain This list may not describe all possible side effects. Call your doctor for medical advice about side effects. You may report side effects to FDA at 1-800-FDA-1088. Where should I keep my medicine? Keep out of the reach of children. Store pre-filled syringes in a refrigerator between 2 and 8 degrees C (36 and 46 degrees F). Do not freeze. Keep in carton to protect from light. Throw away this medicine if it is left out of the refrigerator for more than 48 hours. Throw away any unused medicine after the expiration date. NOTE: This sheet is a summary. It may not cover all possible information. If you have questions about this medicine, talk to your doctor, pharmacist, or health care provider.  2018 Elsevier/Gold Standard (2016-07-19 12:58:03)  

## 2018-07-08 ENCOUNTER — Telehealth: Payer: Self-pay | Admitting: *Deleted

## 2018-07-08 ENCOUNTER — Other Ambulatory Visit: Payer: Self-pay

## 2018-07-08 NOTE — Progress Notes (Signed)
GI Tumor Board  07/02/18  Neoadjuvant chemotherapy Start Pancreas Folfirinox     07/05/18 MRI at restaging  Genetics

## 2018-07-08 NOTE — Telephone Encounter (Signed)
TCT patient to follow up with s/p his 1st chemo treatment on 07/05/18. Spoke with patient . He states he is doing pretty well. Denies fever, chills, nausea, vomiting, diarrhea.  He states he is drinking fluids well and appetite is ok at this time. He has been up and out of the house and overall, feeling well. He is aware of upcoming appts nest week.

## 2018-07-09 ENCOUNTER — Other Ambulatory Visit: Payer: Self-pay | Admitting: Hematology

## 2018-07-09 ENCOUNTER — Telehealth: Payer: Self-pay

## 2018-07-09 NOTE — Telephone Encounter (Signed)
Left voice message for Ernest Wu in scheduling regarding getting US liver biopsy scheduled asap either Friday or beginning of next week.  Requested she return my call with the scheduled date/time.

## 2018-07-10 ENCOUNTER — Other Ambulatory Visit: Payer: Self-pay

## 2018-07-10 NOTE — Progress Notes (Signed)
Tumor Board Discussion 07/09/18  US Liver BX Neoadjuvant Folfirinox

## 2018-07-13 ENCOUNTER — Other Ambulatory Visit: Payer: Self-pay | Admitting: Radiology

## 2018-07-15 ENCOUNTER — Encounter (HOSPITAL_COMMUNITY): Payer: Self-pay

## 2018-07-15 ENCOUNTER — Other Ambulatory Visit: Payer: Self-pay | Admitting: Hematology

## 2018-07-15 ENCOUNTER — Ambulatory Visit (HOSPITAL_COMMUNITY)
Admission: RE | Admit: 2018-07-15 | Discharge: 2018-07-15 | Disposition: A | Discharge status: Home / Self Care | Payer: Managed Care, Other (non HMO) | Source: Ambulatory Visit | Attending: Hematology | Admitting: Hematology

## 2018-07-15 DIAGNOSIS — K769 Liver disease, unspecified: Secondary | ICD-10-CM | POA: Insufficient documentation

## 2018-07-15 DIAGNOSIS — Z791 Long term (current) use of non-steroidal anti-inflammatories (NSAID): Secondary | ICD-10-CM | POA: Insufficient documentation

## 2018-07-15 DIAGNOSIS — I2510 Atherosclerotic heart disease of native coronary artery without angina pectoris: Secondary | ICD-10-CM | POA: Diagnosis not present

## 2018-07-15 DIAGNOSIS — C259 Malignant neoplasm of pancreas, unspecified: Secondary | ICD-10-CM | POA: Diagnosis not present

## 2018-07-15 DIAGNOSIS — Z9689 Presence of other specified functional implants: Secondary | ICD-10-CM | POA: Diagnosis not present

## 2018-07-15 DIAGNOSIS — I712 Thoracic aortic aneurysm, without rupture: Secondary | ICD-10-CM | POA: Diagnosis not present

## 2018-07-15 DIAGNOSIS — R918 Other nonspecific abnormal finding of lung field: Secondary | ICD-10-CM | POA: Insufficient documentation

## 2018-07-15 DIAGNOSIS — Z79899 Other long term (current) drug therapy: Secondary | ICD-10-CM | POA: Insufficient documentation

## 2018-07-15 DIAGNOSIS — K219 Gastro-esophageal reflux disease without esophagitis: Secondary | ICD-10-CM | POA: Diagnosis not present

## 2018-07-15 DIAGNOSIS — D490 Neoplasm of unspecified behavior of digestive system: Secondary | ICD-10-CM

## 2018-07-15 DIAGNOSIS — F17210 Nicotine dependence, cigarettes, uncomplicated: Secondary | ICD-10-CM | POA: Insufficient documentation

## 2018-07-15 DIAGNOSIS — I700 Atherosclerosis of aorta: Secondary | ICD-10-CM | POA: Insufficient documentation

## 2018-07-15 LAB — COMPREHENSIVE METABOLIC PANEL
ALBUMIN: 4 g/dL (ref 3.5–5.0)
ALT: 57 U/L — ABNORMAL HIGH (ref 0–44)
ANION GAP: 10 (ref 5–15)
AST: 27 U/L (ref 15–41)
Alkaline Phosphatase: 146 U/L — ABNORMAL HIGH (ref 38–126)
BUN: 7 mg/dL (ref 6–20)
CALCIUM: 9.1 mg/dL (ref 8.9–10.3)
CO2: 25 mmol/L (ref 22–32)
Chloride: 107 mmol/L (ref 98–111)
Creatinine, Ser: 0.81 mg/dL (ref 0.61–1.24)
GFR calc Af Amer: >60 mL/min (ref >60)
GFR calc non Af Amer: >60 mL/min (ref >60)
Glucose, Bld: 104 mg/dL — ABNORMAL HIGH (ref 70–99)
Potassium: 3.8 mmol/L (ref 3.5–5.1)
Sodium: 142 mmol/L (ref 135–145)
Total Bilirubin: 0.9 mg/dL (ref 0.3–1.2)
Total Protein: 6.4 g/dL — ABNORMAL LOW (ref 6.5–8.1)

## 2018-07-15 LAB — CBC WITH DIFFERENTIAL/PLATELET
Abs Immature Granulocytes: 0.24 10*3/uL — ABNORMAL HIGH (ref 0.00–0.07)
BASOS ABS: 0.1 10*3/uL (ref 0.0–0.1)
Basophils Relative: 0 %
Eosinophils Absolute: 0.1 10*3/uL (ref 0.0–0.5)
Eosinophils Relative: 1 %
HCT: 40.4 % (ref 39.0–52.0)
Hemoglobin: 13.2 g/dL (ref 13.0–17.0)
Immature Granulocytes: 2 %
Lymphocytes Relative: 8 %
Lymphs Abs: 1.1 10*3/uL (ref 0.7–4.0)
MCH: 30 pg (ref 26.0–34.0)
MCHC: 32.7 g/dL (ref 30.0–36.0)
MCV: 91.8 fL (ref 80.0–100.0)
MONO ABS: 0.9 10*3/uL (ref 0.1–1.0)
Monocytes Relative: 7 %
Neutro Abs: 11.1 10*3/uL — ABNORMAL HIGH (ref 1.7–7.7)
Neutrophils Relative %: 82 %
Platelets: 115 10*3/uL — ABNORMAL LOW (ref 150–400)
RBC: 4.4 MIL/uL (ref 4.22–5.81)
RDW: 12.9 % (ref 11.5–15.5)
WBC: 13.5 10*3/uL — AB (ref 4.0–10.5)
nRBC: 0 % (ref 0.0–0.2)

## 2018-07-15 LAB — PROTIME-INR
INR: 0.93
Prothrombin Time: 12.4 seconds (ref 11.4–15.2)

## 2018-07-15 MED ORDER — SODIUM CHLORIDE 0.9 % IV SOLN: INTRAVENOUS | Status: DC

## 2018-07-15 MED ORDER — HEPARIN SOD (PORK) LOCK FLUSH 100 UNIT/ML IV SOLN
500.0000 [IU] | INTRAVENOUS | Status: AC | PRN
  Administered 2018-07-15: 500 [IU]
  Filled 2018-07-15: qty 5

## 2018-07-15 NOTE — Consult Note (Signed)
Chief Complaint: Patient was seen in consultation today for image guided liver lesion biopsy  Referring Physician(s): Feng,Yan  Supervising Physician: Markus Daft  Patient Status: [facility] - Out-pt  History of Present Illness: Ernest Wu is a 57 y.o. male smoker with history of recently diagnosed pancreatic cancer and metal biliary stent placement on 06/10/2018.  He is currently on chemotherapy.  Recent imaging has revealed multiple small pulmonary nodules as well as new and enlarging liver lesions concerning for metastatic disease.  He presents today for image guided liver lesion biopsy for further evaluation.  Past Medical History:  Diagnosis Date  . Cancer ([place]) 06/2018   Pancreatic  . GERD (gastroesophageal reflux disease)     Past Surgical History:  Procedure Laterality Date  . BILIARY STENT PLACEMENT  06/10/2018   Procedure: BILIARY STENT PLACEMENT;  Surgeon: Ronnette Juniper, MD;  Location: [facility] ENDOSCOPY;  Service: Gastroenterology;;  . ERCP N/A 06/10/2018   Procedure: ENDOSCOPIC RETROGRADE CHOLANGIOPANCREATOGRAPHY (ERCP);  Surgeon: Ronnette Juniper, MD;  Location: [place];  Service: Gastroenterology;  Laterality: N/A;  . ESOPHAGOGASTRODUODENOSCOPY (EGD) WITH PROPOFOL N/A 06/10/2018   Procedure: ESOPHAGOGASTRODUODENOSCOPY (EGD) WITH PROPOFOL;  Surgeon: Ronnette Juniper, MD;  Location: [place];  Service: Gastroenterology;  Laterality: N/A;  . FINE NEEDLE ASPIRATION  06/10/2018   Procedure: FINE NEEDLE ASPIRATION (FNA) LINEAR;  Surgeon: Ronnette Juniper, MD;  Location: [facility] ENDOSCOPY;  Service: Gastroenterology;;  . Sol Passer PLACEMENT N/A 07/02/2018   Procedure: INSERTION PORT-A-CATH;  Surgeon: Stark Klein, MD;  Location: [place];  Service: General;  Laterality: N/A;  . SPHINCTEROTOMY  06/10/2018   Procedure: SPHINCTEROTOMY;  Surgeon: Ronnette Juniper, MD;  Location: [facility] ENDOSCOPY;  Service: Gastroenterology;;  . UPPER ESOPHAGEAL ENDOSCOPIC ULTRASOUND (EUS) N/A 06/10/2018   Procedure: UPPER  ESOPHAGEAL ENDOSCOPIC ULTRASOUND (EUS);  Surgeon: Ronnette Juniper, MD;  Location: [place];  Service: Gastroenterology;  Laterality: N/A;  . VASECTOMY      Allergies: Contrast media [iodinated diagnostic agents] and Iohexol  Medications: Prior to Admission medications   Medication Sig Start Date End Date Taking? Authorizing Provider  esomeprazole (NEXIUM) 20 MG capsule Take 20 mg by mouth daily.    Yes [provider]  folic acid (FOLVITE) 1 MG tablet Take 1 tablet (1 mg total) by mouth daily. 07/07/18 08/06/18 Yes [name], MD  ibuprofen (ADVIL,MOTRIN) 200 MG tablet Take 400 mg by mouth daily as needed for headache or moderate pain.    Yes [provider]  simethicone (MYLICON) 644 MG chewable tablet Chew 125 mg by mouth every 6 (six) hours as needed for flatulence.   Yes [provider]  ALPRAZolam (XANAX) 0.25 MG tablet Take 1 tablet (0.25 mg total) by mouth at bedtime as needed for anxiety. 07/07/18   [name], MD  diphenoxylate-atropine (LOMOTIL) 2.5-0.025 MG tablet Take 2 tablets by mouth 4 (four) times daily as needed for diarrhea or loose stools. 06/27/18   Alla Feeling, NP  lidocaine-prilocaine (EMLA) cream Apply to affected area once 06/29/18   [name], MD  ondansetron (ZOFRAN) 8 MG tablet Take 1 tablet (8 mg total) by mouth 2 (two) times daily as needed for refractory nausea / vomiting. Start on day 3 after chemotherapy. 06/29/18   [name], MD  oxyCODONE (OXY IR/ROXICODONE) 5 MG immediate release tablet Take 1 tablet (5 mg total) by mouth every 6 (six) hours as needed for severe pain. 07/02/18   Stark Klein, MD  prochlorperazine (COMPAZINE) 10 MG tablet Take 1 tablet (10 mg total) by mouth every 6 (  six) hours as needed (NAUSEA). 06/29/18   [name], MD     History reviewed. No pertinent family history.  Social History   Socioeconomic History  . Marital status: Married    Spouse name: Not on file  . Number of children: 5  . Years of  education: Not on file  . Highest education level: Not on file  Occupational History  . Not on file  Social Needs  . Financial resource strain: Not on file  . Food insecurity:    Worry: Not on file    Inability: Not on file  . Transportation needs:    Medical: Not on file    Non-medical: Not on file  Tobacco Use  . Smoking status: Current Every Day Smoker    Packs/day: 0.25    Years: 30.00    Pack years: 7.50  . Smokeless tobacco: Never Used  . Tobacco comment: cut back considerably  Substance and Sexual Activity  . Alcohol use: Yes    Alcohol/week: 21.0 standard drinks    Types: 21 Cans of beer per week  . Drug use: Never  . Sexual activity: Not on file  Lifestyle  . Physical activity:    Days per week: Not on file    Minutes per session: Not on file  . Stress: Not on file  Relationships  . Social connections:    Talks on phone: Not on file    Gets together: Not on file    Attends religious service: Not on file    Active member of club or organization: Not on file    Attends meetings of clubs or organizations: Not on file    Relationship status: Not on file  Other Topics Concern  . Not on file  Social History Narrative  . Not on file      Review of Systems denies fever, headache, chest pain, dyspnea, cough, abdominal/back pain, nausea, vomiting or bleeding.  Vital Signs: BP 140/86 (BP Location: Right Arm)   Pulse 75   Temp 98.6 F (37 C) (Oral)   Resp 18   SpO2 96%   Physical Exam awake, alert.  Chest clear to auscultation bilaterally.  Clean, intact left chest wall Port-A-Cath.  Heart with regular rate and rhythm.  Abdomen soft, positive bowel sounds, nontender.  No lower extremity edema.  Imaging: Ct Chest Wo Contrast  Result Date: 07/02/2018 CLINICAL DATA:  57 year old male with history of pancreatic neoplasm. Evaluate for metastatic disease. EXAM: CT CHEST WITHOUT CONTRAST TECHNIQUE: Multidetector CT imaging of the chest was performed following the  standard protocol without IV contrast. COMPARISON:  None. FINDINGS: Cardiovascular: Heart size is normal. There is no significant pericardial fluid, thickening or pericardial calcification. There is aortic atherosclerosis, as well as atherosclerosis of the great vessels of the mediastinum and the coronary arteries, including calcified atherosclerotic plaque in the left main, left anterior descending and right coronary arteries. Mild aneurysmal dilatation of the ascending thoracic aorta (4.8 cm in diameter). Left subclavian single-lumen porta cath with tip terminating in the distal superior vena cava. Mediastinum/Nodes: No pathologically enlarged mediastinal or hilar lymph nodes. Please note that accurate exclusion of hilar adenopathy is limited on noncontrast CT scans. Esophagus is unremarkable in appearance. No axillary lymphadenopathy. Lungs/Pleura: Multiple small pulmonary nodules are noted scattered throughout the lungs bilaterally measuring up to 7 mm in size in the posterior aspect of the right middle lobe abutting the major fissure (axial image 102 of series 4). No acute consolidative airspace disease. No pleural effusions.  Upper Abdomen: Common bile duct stent in position. Small amount of pneumobilia in the left lobe of the liver. Several low-attenuation lesions are noted in the liver, incompletely characterized on today's noncontrast CT examination, but new and enlarging when compared to prior study from 06/07/2018, highly concerning for metastatic lesions. The largest of these include a 2.1 x 1.7 cm lesion in segment 4A (axial image 136 of series 3), and a 2.1 x 1.5 cm lesion in segment 8 of the liver (axial image 136 of series 3). Aortic atherosclerosis. Musculoskeletal: There are no aggressive appearing lytic or blastic lesions noted in the visualized portions of the skeleton. IMPRESSION: 1. Multiple small pulmonary nodules scattered throughout the lungs measuring up to 7 mm in size. These are  nonspecific, but the possibility of metastatic disease should be considered, and close attention at time of routine followups is recommended. 2. In addition, today's study demonstrates new and enlarging low-attenuation lesions in the liver. This is poorly evaluated on today's noncontrast CT examination, but is concerning for potential metastatic disease. Further evaluation with repeat nonemergent MRI of the abdomen with and without IV gadolinium is suggested in the near future to better evaluate these findings. 3. Aortic atherosclerosis, in addition to left main and 2 vessel coronary artery disease. Please note that although the presence of coronary artery calcium documents the presence of coronary artery disease, the severity of this disease and any potential stenosis cannot be assessed on this non-gated CT examination. Assessment for potential risk factor modification, dietary therapy or pharmacologic therapy may be warranted, if clinically indicated. 4. Mild aneurysmal dilatation of the ascending thoracic aorta (4.8 cm in diameter). Ascending thoracic aortic aneurysm. Recommend semi-annual imaging followup by CTA or MRA and referral to cardiothoracic surgery if not already obtained. This recommendation follows 2010 ACCF/AHA/AATS/ACR/ASA/SCA/SCAI/SIR/STS/SVM Guidelines for the Diagnosis and Management of Patients With Thoracic Aortic Disease. Circulation. 2010; 121: D622-W979. Aortic Atherosclerosis (ICD10-I70.0). Aortic aneurysm NOS (ICD10-I71.9). Electronically Signed   By: Vinnie Langton M.D.   On: 07/02/2018 15:31   Dg Chest Port 1 View  Result Date: 07/02/2018 CLINICAL DATA:  Port-A-Cath placement. EXAM: PORTABLE CHEST 1 VIEW COMPARISON:  Abdominal CT 06/07/2018. FINDINGS: 1326 hours. Left subclavian Port-A-Cath extends to the level of the lower SVC. The catheter is slightly kinked where it passes under the clavicle. The heart size is normal. The lungs are clear. There is no pleural effusion or  pneumothorax. IMPRESSION: Port-A-Cath tip at the level of the lower SVC.  No pneumothorax. Electronically Signed   By: Richardean Sale M.D.   On: 07/02/2018 13:37   Dg Fluoro Guide Cv Line-no Report  Result Date: 07/02/2018 Fluoroscopy was utilized by the requesting physician.  No radiographic interpretation.    Labs:  CBC: Recent Labs    06/11/18 0549 06/27/18 1329 07/04/18 1332 07/15/18 1140  WBC 8.9 5.3 5.0 13.5*  HGB 13.1 14.7 13.9 13.2  HCT 40.5 44.2 42.2 40.4  PLT 155 189 141* PENDING    COAGS: Recent Labs    06/07/18 1915 07/15/18 1140  INR 0.98 0.93    BMP: Recent Labs    06/11/18 0549 06/27/18 1329 07/04/18 1332 07/15/18 1140  NA 138 142 142 142  K 3.7 4.2 4.0 3.8  CL 106 106 106 107  CO2 27 26 27 25   GLUCOSE 115* 101* 109* 104*  BUN 7 6 8 7   CALCIUM 8.5* 9.7 9.4 9.1  CREATININE 0.77 0.82 0.75 0.81  GFRNONAA >60 >60 >60 >60  GFRAA >60 >60 >60 >60  LIVER FUNCTION TESTS: Recent Labs    06/11/18 0549 06/27/18 1329 07/04/18 1332 07/15/18 1140  BILITOT 4.4* 1.5* 1.0 0.9  AST 197* 22 22 27   ALT 599* 56* 44 57*  ALKPHOS 218* 125 108 146*  PROT 5.7* 7.0 6.8 6.4*  ALBUMIN 3.0* 3.9 3.7 4.0    TUMOR MARKERS: No results for input(s): AFPTM, CEA, CA199, CHROMGRNA in the last 8760 hours.  Assessment and Plan: 57 y.o. male smoker with history of recently diagnosed pancreatic cancer and metal biliary stent placement on 06/10/2018.  He is currently on chemotherapy.  Recent imaging has revealed multiple small pulmonary nodules as well as new and enlarging liver lesions concerning for metastatic disease.  He presents today for image guided liver lesion biopsy for further evaluation.Risks and benefits discussed with the patient/family including, but not limited to bleeding, infection, damage to adjacent structures or low yield requiring additional tests.  All of the patient's questions were answered, patient is agreeable to proceed. Consent signed and in  chart.     Thank you for this interesting consult.  I greatly enjoyed meeting VICKY MCCANLESS and look forward to participating in their care.  A copy of this report was sent to the requesting provider on this date.  Electronically Signed: D. Rowe Robert, PA-C 07/15/2018, 12:15 PM   I spent a total of 25 minutes    in face to face in clinical consultation, greater than 50% of which was counseling/coordinating care for image guided liver lesion biopsy

## 2018-07-16 ENCOUNTER — Other Ambulatory Visit: Payer: Self-pay | Admitting: Hematology

## 2018-07-16 ENCOUNTER — Telehealth: Payer: Self-pay

## 2018-07-16 MED ORDER — LORAZEPAM 1 MG PO TABS: 1.0000 mg | ORAL_TABLET | Freq: Once | ORAL | 0 refills | Status: AC

## 2018-07-16 NOTE — Progress Notes (Signed)
[place]   [phone] [phone]   Clinic Follow up Note   Patient Care Team: Street, [name], MD as PCP - General (Family Medicine) 07/17/2018  CHIEF COMPLAINT: F/u on pancreatic cancer   SUMMARY OF ONCOLOGIC HISTORY: Oncology History   Cancer Staging Adenocarcinoma of pancreas [facility]) Staging form: Exocrine Pancreas, AJCC 8th Edition - Clinical stage from 06/10/2018: Stage IB (cT2, cN0, cM0) - Signed by [name], MD on 06/29/2018       Adenocarcinoma of pancreas ([place])   06/07/2018 Imaging    CT AP w Contrast IMPRESSION: 1. There is a low attenuation mass centered around the neck of pancreas which is concerning for neoplasm. Pancreatic adenocarcinoma favored. This results in common bile duct obstruction with mild intrahepatic biliary ductal dilatation. There also is involvement of the portal venous confluence. Further evaluation with nonemergent contrast enhanced MRI of the pancreas is recommended. 2. Small indeterminate low-attenuation structure is noted within segment 4 a of the liver. This could be better addressed at MRI.    06/09/2018 Imaging    MR ABD MRCP  The pancreatic mass involves approximately 40 percent of the main portal vein circumference at the portal splenic venous confluence, with associated mild narrowing of the portal splenic venous confluence. The SMV, splenic vein and main, right and left portal veins remain patent. The celiac trunk and SMA are not involved by the pancreatic mass.  IMPRESSION: 2.3 cm diameter hypoenhancing mass at the junction of the head and neck of pancreas with pancreatic ductal dilatation, likely adenocarcinoma.  Intra and extrahepatic bile duct dilatation with abrupt change in caliber at the mid common bile duct. This could indicate an obstructing mass or stricture.     06/10/2018 Initial Biopsy    Diagnosis PANCREAS, FINE NEEDLE ASPIRATION (SPECIMEN 1 OF 1 COLLECTED 06/10/18): MALIGNANT  CELLS CONSISTENT WITH ADENOCARCINOMA.    06/10/2018 Procedure    IMPRESSION: 1. 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 2. Placement of a metallic biliary stent.    06/10/2018 Procedure    EUS per Dr. Paulita Fujita Impression:  - There was dilation in the common bile duct which measured up to 12 mm. - A mass was identified in the pancreatic head. This was staged T3 N1 Mx by endosonographic criteria. Fine needle aspiration performed. - A few lymph nodes were visualized and measured in the peripancreatic region. - There was no evidence of significant pathology in the left lobe of the liver.    06/10/2018 Cancer Staging    Staging form: Exocrine Pancreas, AJCC 8th Edition - Clinical stage from 06/10/2018: Stage IB (cT2, cN0, cM0) - Signed by [name], MD on 06/29/2018    06/27/2018 Initial Diagnosis    Adenocarcinoma of pancreas ([place])    07/02/2018 Imaging    IMPRESSION: 1. Multiple small pulmonary nodules scattered throughout the lungs measuring up to 7 mm in size. These are nonspecific, but the possibility of metastatic disease should be considered, and close attention at time of routine followups is recommended. 2. In addition, today's study demonstrates new and enlarging low-attenuation lesions in the liver. This is poorly evaluated on today's noncontrast CT examination, but is concerning for potential metastatic disease. Further evaluation with repeat nonemergent MRI of the abdomen with and without IV gadolinium is suggested in the near future to better evaluate these findings. 3. Aortic atherosclerosis, in addition to left main and 2 vessel coronary artery disease. Please note that although the  presence of coronary artery calcium documents the presence of coronary artery disease, the severity of this disease and any potential stenosis cannot be assessed on this non-gated CT examination. Assessment for potential risk factor  modification, dietary therapy or pharmacologic therapy may be warranted, if clinically indicated. 4. Mild aneurysmal dilatation of the ascending thoracic aorta (4.8 cm in diameter). Ascending thoracic aortic aneurysm. Recommend semi-annual imaging followup by CTA or MRA and referral to cardiothoracic surgery if not already obtained. This recommendation follows 2010 ACCF/AHA/AATS/ACR/ASA/SCA/SCAI/SIR/STS/SVM Guidelines for the Diagnosis and Management of Patients With Thoracic Aortic Disease. Circulation. 2010; 121: T701-X793.  Aortic Atherosclerosis (ICD10-I70.0). Aortic aneurysm NOS (ICD10-I71.9).    07/04/2018 -  Chemotherapy     FOLFIRINOX q2 weeks    07/15/2018 Imaging    07/15/2018 Liver US IMPRESSION: No liver lesions identified with ultrasound. Ultrasound-guided biopsy was not performed. Recommend further characterization for liver lesions with a repeat MRI, with and without contrast.     CURRENT THERAPY:  FOLFIRINOX q2 weeks, started on 07/05/2018   INTERVAL HISTORY: Ernest Wu is a 57 y.o. male who is here for follow-up. Today, he is here with his wife. He tolerated last cycle chemo well and is gaining weight. His nausea is very mild and he denies vomiting or diarrhea. He states that he noticed the his stool habits have slowed down and he is slightly constipated and takes laxatives as needed. He noticed abdominal wall spasm, mainly on the left side.     Pertinent positives and negatives of review of systems are listed and detailed within the above HPI.  REVIEW OF SYSTEMS:   Constitutional: Denies fevers, chills or abnormal weight loss Eyes: Denies blurriness of vision Ears, nose, mouth, throat, and face: Denies mucositis or sore throat Respiratory: Denies cough, dyspnea or wheezes Cardiovascular: Denies palpitation, chest discomfort or lower extremity swelling Gastrointestinal:  Denies nausea, heartburn or change in bowel habits (+) left abdominal wall  cramps Skin: Denies abnormal skin rashes Lymphatics: Denies new lymphadenopathy or easy bruising Neurological:Denies numbness, tingling or new weaknesses Behavioral/Psych: Mood is stable, no new changes  All other systems were reviewed with the patient and are negative.  MEDICAL HISTORY:  Past Medical History:  Diagnosis Date  . Cancer ([place]) 06/2018   Pancreatic  . GERD (gastroesophageal reflux disease)     SURGICAL HISTORY: Past Surgical History:  Procedure Laterality Date  . BILIARY STENT PLACEMENT  06/10/2018   Procedure: BILIARY STENT PLACEMENT;  Surgeon: Ronnette Juniper, MD;  Location: [facility] ENDOSCOPY;  Service: Gastroenterology;;  . ERCP N/A 06/10/2018   Procedure: ENDOSCOPIC RETROGRADE CHOLANGIOPANCREATOGRAPHY (ERCP);  Surgeon: Ronnette Juniper, MD;  Location: [place];  Service: Gastroenterology;  Laterality: N/A;  . ESOPHAGOGASTRODUODENOSCOPY (EGD) WITH PROPOFOL N/A 06/10/2018   Procedure: ESOPHAGOGASTRODUODENOSCOPY (EGD) WITH PROPOFOL;  Surgeon: Ronnette Juniper, MD;  Location: [place];  Service: Gastroenterology;  Laterality: N/A;  . FINE NEEDLE ASPIRATION  06/10/2018   Procedure: FINE NEEDLE ASPIRATION (FNA) LINEAR;  Surgeon: Ronnette Juniper, MD;  Location: [facility] ENDOSCOPY;  Service: Gastroenterology;;  . Sol Passer PLACEMENT N/A 07/02/2018   Procedure: INSERTION PORT-A-CATH;  Surgeon: Stark Klein, MD;  Location: [place];  Service: General;  Laterality: N/A;  . SPHINCTEROTOMY  06/10/2018   Procedure: SPHINCTEROTOMY;  Surgeon: Ronnette Juniper, MD;  Location: [facility] ENDOSCOPY;  Service: Gastroenterology;;  . UPPER ESOPHAGEAL ENDOSCOPIC ULTRASOUND (EUS) N/A 06/10/2018   Procedure: UPPER ESOPHAGEAL ENDOSCOPIC ULTRASOUND (EUS);  Surgeon: Ronnette Juniper, MD;  Location: [place];  Service: Gastroenterology;  Laterality: N/A;  . VASECTOMY  I have reviewed the social history and family history with the patient and they are unchanged from previous note.  ALLERGIES:  is allergic to contrast media  [iodinated diagnostic agents] and iohexol.  MEDICATIONS:  Current Outpatient Medications  Medication Sig Dispense Refill  . ALPRAZolam (XANAX) 0.25 MG tablet Take 1 tablet (0.25 mg total) by mouth at bedtime as needed for anxiety. 30 tablet 0  . diphenoxylate-atropine (LOMOTIL) 2.5-0.025 MG tablet Take 2 tablets by mouth 4 (four) times daily as needed for diarrhea or loose stools. 40 tablet 0  . esomeprazole (NEXIUM) 20 MG capsule Take 20 mg by mouth daily.     . folic acid (FOLVITE) 1 MG tablet Take 1 tablet (1 mg total) by mouth daily. 30 tablet 3  . ibuprofen (ADVIL,MOTRIN) 200 MG tablet Take 400 mg by mouth daily as needed for headache or moderate pain.     Marland Kitchen lidocaine-prilocaine (EMLA) cream Apply to affected area once 30 g 3  . ondansetron (ZOFRAN) 8 MG tablet Take 1 tablet (8 mg total) by mouth 2 (two) times daily as needed for refractory nausea / vomiting. Start on day 3 after chemotherapy. 30 tablet 1  . oxyCODONE (OXY IR/ROXICODONE) 5 MG immediate release tablet Take 1 tablet (5 mg total) by mouth every 6 (six) hours as needed for severe pain. 20 tablet 0  . prochlorperazine (COMPAZINE) 10 MG tablet Take 1 tablet (10 mg total) by mouth every 6 (six) hours as needed (NAUSEA). 30 tablet 1  . simethicone (MYLICON) 941 MG chewable tablet Chew 125 mg by mouth every 6 (six) hours as needed for flatulence.     No current facility-administered medications for this visit.    Facility-Administered Medications Ordered in Other Visits  Medication Dose Route Frequency Provider Last Rate Last Dose  . heparin lock flush 100 unit/mL  500 Units Intracatheter Once PRN [name], MD      . sodium chloride flush (NS) 0.9 % injection 10 mL  10 mL Intracatheter PRN [name], MD        PHYSICAL EXAMINATION: ECOG PERFORMANCE STATUS: 1 - Symptomatic but completely ambulatory  Vitals:   07/17/18 1106  BP: 134/89  Pulse: 70  Resp: 18  Temp: 98.3 F (36.8 C)  SpO2: 97%   Filed Weights   07/17/18  1106  Weight: 200 lb 11.2 oz (91 kg)    GENERAL:alert, no distress and comfortable SKIN: skin color, texture, turgor are normal, no rashes or significant lesions EYES: normal, Conjunctiva are pink and non-injected, sclera clear OROPHARYNX:no exudate, no erythema and lips, buccal mucosa, and tongue normal  NECK: supple, thyroid normal size, non-tender, without nodularity LYMPH:  no palpable lymphadenopathy in the cervical, axillary or inguinal LUNGS: clear to auscultation and percussion with normal breathing effort HEART: regular rate & rhythm and no murmurs and no lower extremity edema ABDOMEN:abdomen soft, non-tender and normal bowel sounds Musculoskeletal:no cyanosis of digits and no clubbing  NEURO: alert & oriented x 3 with fluent speech, no focal motor/sensory deficits  LABORATORY DATA:  I have reviewed the data as listed CBC Latest Ref Rng & Units 07/17/2018 07/15/2018 07/04/2018  WBC 4.0 - 10.5 K/uL 9.6 13.5(H) 5.0  Hemoglobin 13.0 - 17.0 g/dL 13.9 13.2 13.9  Hematocrit 39.0 - 52.0 % 41.7 40.4 42.2  Platelets 150 - 400 K/uL 115(L) 115(L) 141(L)     CMP Latest Ref Rng & Units 07/17/2018 07/15/2018 07/04/2018  Glucose 70 - 99 mg/dL 77 104(H) 109(H)  BUN 6 - 20 mg/dL  11 7 8   Creatinine 0.61 - 1.24 mg/dL 0.93 0.81 0.75  Sodium 135 - 145 mmol/L 144 142 142  Potassium 3.5 - 5.1 mmol/L 4.4 3.8 4.0  Chloride 98 - 111 mmol/L 107 107 106  CO2 22 - 32 mmol/L 28 25 27   Calcium 8.9 - 10.3 mg/dL 9.5 9.1 9.4  Total Protein 6.5 - 8.1 g/dL 6.8 6.4(L) 6.8  Total Bilirubin 0.3 - 1.2 mg/dL 0.6 0.9 1.0  Alkaline Phos 38 - 126 U/L 162(H) 146(H) 108  AST 15 - 41 U/L 29 27 22   ALT 0 - 44 U/L 62(H) 57(H) 44      RADIOGRAPHIC STUDIES: I have personally reviewed the radiological images as listed and agreed with the findings in the report.  07/15/2018 Liver US IMPRESSION: No liver lesions identified with ultrasound. Ultrasound-guided biopsy was not performed. Recommend further  characterization for liver lesions with a repeat MRI, with and without contrast.  ASSESSMENT & PLAN:  Ernest Wu is a 57 y.o. male with history of  1. Adenocarcinoma of the pancreas, cT3N1Mx, borderline resectable,possible liver metastasis  -He presented with obstructive jaundice, status post MRCP and stent placement.  Diagnosed in 06/2018.  -He was seen by surgeon Dr. Barry Dienes, his pancreatic tumor was borderline resectable, neoadjuvant chemotherapy was recommended.   -He has started [facility]. q2 weeks. Tolerated the first cycle very well with mild fatigue, loss of appetite and cold sensitivity. He has recovered well  -I reviewed and discussed his liver US, that revealed no masses and therefore a biopsy was not done. I have ordered a abdomen w wo contrast to further evaluate his liver lesions, which is scheduled for later next week  -Labs reviewed, CBC is over all WNLs except for PLT 115K. CMP and CA 19.9 pending.  -f/u in 2 weeks   2. Alcohol and smoking cessation  -I previously encouraged him to quit completely. He is willing. -He had quit alcohol and is currently on folic acid.  3.  Mild thrombocytopenia -Due to chemotherapy, will continue monitoring closely  Plan  -Labs reviewed, good to proceed with chemo cycle 2 FOLFIRINOX  -f/u in 2 weeks  -He is scheduled for liver MRI later next week, I will call him after the scan, to see if we will proceed CT-guided biopsy, versus observation  No problem-specific Assessment & Plan notes found for this encounter.   No orders of the defined types were placed in this encounter.  All questions were answered. The patient knows to call the clinic with any problems, questions or concerns. No barriers to learning was detected. I spent 20 minutes counseling the patient face to face. The total time spent in the appointment was 25 minutes and more than 50% was on counseling and review of test results  I, Noor Dweik am acting as scribe for Dr.  [name].  I have reviewed the above documentation for accuracy and completeness, and I agree with the above.     [name], MD 07/17/2018

## 2018-07-16 NOTE — Telephone Encounter (Signed)
Patient's wife called stating that he has MRI scheduled for 12/19.  He is claustrophobic and requesting something to take prior to sent into [facility] in [place]. Called her back to let her know it was sent in.

## 2018-07-17 ENCOUNTER — Other Ambulatory Visit: Payer: Managed Care, Other (non HMO)

## 2018-07-17 ENCOUNTER — Inpatient Hospital Stay: Payer: Managed Care, Other (non HMO) | Admitting: Nutrition

## 2018-07-17 ENCOUNTER — Encounter: Payer: Self-pay | Admitting: Hematology

## 2018-07-17 ENCOUNTER — Ambulatory Visit: Payer: Managed Care, Other (non HMO) | Admitting: Nurse Practitioner

## 2018-07-17 ENCOUNTER — Telehealth: Payer: Self-pay | Admitting: Hematology

## 2018-07-17 ENCOUNTER — Inpatient Hospital Stay: Payer: Managed Care, Other (non HMO)

## 2018-07-17 ENCOUNTER — Inpatient Hospital Stay (HOSPITAL_BASED_OUTPATIENT_CLINIC_OR_DEPARTMENT_OTHER): Payer: Managed Care, Other (non HMO) | Admitting: Hematology

## 2018-07-17 VITALS — BP 134/89 | HR 70 | Temp 98.3°F | Resp 18 | Ht 72.0 in | Wt 200.7 lb

## 2018-07-17 DIAGNOSIS — R110 Nausea: Secondary | ICD-10-CM

## 2018-07-17 DIAGNOSIS — K769 Liver disease, unspecified: Secondary | ICD-10-CM

## 2018-07-17 DIAGNOSIS — I2510 Atherosclerotic heart disease of native coronary artery without angina pectoris: Secondary | ICD-10-CM

## 2018-07-17 DIAGNOSIS — Z95828 Presence of other vascular implants and grafts: Secondary | ICD-10-CM

## 2018-07-17 DIAGNOSIS — I712 Thoracic aortic aneurysm, without rupture: Secondary | ICD-10-CM

## 2018-07-17 DIAGNOSIS — D696 Thrombocytopenia, unspecified: Secondary | ICD-10-CM

## 2018-07-17 DIAGNOSIS — R252 Cramp and spasm: Secondary | ICD-10-CM

## 2018-07-17 DIAGNOSIS — C259 Malignant neoplasm of pancreas, unspecified: Secondary | ICD-10-CM

## 2018-07-17 DIAGNOSIS — R630 Anorexia: Secondary | ICD-10-CM

## 2018-07-17 DIAGNOSIS — K5900 Constipation, unspecified: Secondary | ICD-10-CM

## 2018-07-17 DIAGNOSIS — F17210 Nicotine dependence, cigarettes, uncomplicated: Secondary | ICD-10-CM

## 2018-07-17 DIAGNOSIS — R203 Hyperesthesia: Secondary | ICD-10-CM

## 2018-07-17 DIAGNOSIS — Z79899 Other long term (current) drug therapy: Secondary | ICD-10-CM

## 2018-07-17 DIAGNOSIS — Z791 Long term (current) use of non-steroidal anti-inflammatories (NSAID): Secondary | ICD-10-CM

## 2018-07-17 DIAGNOSIS — K219 Gastro-esophageal reflux disease without esophagitis: Secondary | ICD-10-CM

## 2018-07-17 DIAGNOSIS — R5383 Other fatigue: Secondary | ICD-10-CM

## 2018-07-17 DIAGNOSIS — R918 Other nonspecific abnormal finding of lung field: Secondary | ICD-10-CM | POA: Diagnosis not present

## 2018-07-17 DIAGNOSIS — C257 Malignant neoplasm of other parts of pancreas: Secondary | ICD-10-CM

## 2018-07-17 DIAGNOSIS — I700 Atherosclerosis of aorta: Secondary | ICD-10-CM

## 2018-07-17 LAB — CBC WITH DIFFERENTIAL (CANCER CENTER ONLY)
Abs Immature Granulocytes: 0.11 10*3/uL — ABNORMAL HIGH (ref 0.00–0.07)
Basophils Absolute: 0.1 10*3/uL (ref 0.0–0.1)
Basophils Relative: 1 %
EOS ABS: 0.2 10*3/uL (ref 0.0–0.5)
Eosinophils Relative: 2 %
HCT: 41.7 % (ref 39.0–52.0)
Hemoglobin: 13.9 g/dL (ref 13.0–17.0)
Immature Granulocytes: 1 %
LYMPHS ABS: 1.6 10*3/uL (ref 0.7–4.0)
Lymphocytes Relative: 16 %
MCH: 29.5 pg (ref 26.0–34.0)
MCHC: 33.3 g/dL (ref 30.0–36.0)
MCV: 88.5 fL (ref 80.0–100.0)
MONOS PCT: 7 %
Monocytes Absolute: 0.7 10*3/uL (ref 0.1–1.0)
Neutro Abs: 6.9 10*3/uL (ref 1.7–7.7)
Neutrophils Relative %: 73 %
Platelet Count: 115 10*3/uL — ABNORMAL LOW (ref 150–400)
RBC: 4.71 MIL/uL (ref 4.22–5.81)
RDW: 12.9 % (ref 11.5–15.5)
WBC Count: 9.6 10*3/uL (ref 4.0–10.5)
nRBC: 0 % (ref 0.0–0.2)

## 2018-07-17 LAB — CMP (CANCER CENTER ONLY)
ALT: 62 U/L — ABNORMAL HIGH (ref 0–44)
AST: 29 U/L (ref 15–41)
Albumin: 3.7 g/dL (ref 3.5–5.0)
Alkaline Phosphatase: 162 U/L — ABNORMAL HIGH (ref 38–126)
Anion gap: 9 (ref 5–15)
BILIRUBIN TOTAL: 0.6 mg/dL (ref 0.3–1.2)
BUN: 11 mg/dL (ref 6–20)
CO2: 28 mmol/L (ref 22–32)
Calcium: 9.5 mg/dL (ref 8.9–10.3)
Chloride: 107 mmol/L (ref 98–111)
Creatinine: 0.93 mg/dL (ref 0.61–1.24)
GFR, Estimated: >60 mL/min (ref >60)
Glucose, Bld: 77 mg/dL (ref 70–99)
POTASSIUM: 4.4 mmol/L (ref 3.5–5.1)
Sodium: 144 mmol/L (ref 135–145)
Total Protein: 6.8 g/dL (ref 6.5–8.1)

## 2018-07-17 MED ORDER — OXALIPLATIN CHEMO INJECTION 100 MG/20ML
85.0000 mg/m2 | Freq: Once | INTRAVENOUS | Status: AC
  Administered 2018-07-17: 180 mg via INTRAVENOUS
  Filled 2018-07-17: qty 36

## 2018-07-17 MED ORDER — SODIUM CHLORIDE 0.9% FLUSH
10.0000 mL | Freq: Once | INTRAVENOUS | Status: AC
  Administered 2018-07-17: 10 mL
  Filled 2018-07-17: qty 10

## 2018-07-17 MED ORDER — PALONOSETRON HCL INJECTION 0.25 MG/5ML
0.2500 mg | Freq: Once | INTRAVENOUS | Status: AC
  Administered 2018-07-17: 0.25 mg via INTRAVENOUS

## 2018-07-17 MED ORDER — ATROPINE SULFATE 1 MG/ML IJ SOLN: 0.5000 mg | Freq: Once | INTRAMUSCULAR | Status: DC | PRN

## 2018-07-17 MED ORDER — SODIUM CHLORIDE 0.9 % IV SOLN
2375.0000 mg/m2 | INTRAVENOUS | Status: DC
  Administered 2018-07-17: 5000 mg via INTRAVENOUS
  Filled 2018-07-17: qty 100

## 2018-07-17 MED ORDER — DEXTROSE 5 % IV SOLN
Freq: Once | INTRAVENOUS | Status: AC
  Administered 2018-07-17: 12:00:00 via INTRAVENOUS
  Filled 2018-07-17: qty 250

## 2018-07-17 MED ORDER — IRINOTECAN HCL CHEMO INJECTION 100 MG/5ML
150.0000 mg/m2 | Freq: Once | INTRAVENOUS | Status: AC
  Administered 2018-07-17: 320 mg via INTRAVENOUS
  Filled 2018-07-17: qty 15

## 2018-07-17 MED ORDER — PALONOSETRON HCL INJECTION 0.25 MG/5ML
INTRAVENOUS | Status: AC
  Filled 2018-07-17: qty 5

## 2018-07-17 MED ORDER — DEXAMETHASONE SODIUM PHOSPHATE 10 MG/ML IJ SOLN
10.0000 mg | Freq: Once | INTRAMUSCULAR | Status: AC
  Administered 2018-07-17: 10 mg via INTRAVENOUS

## 2018-07-17 MED ORDER — DEXAMETHASONE SODIUM PHOSPHATE 10 MG/ML IJ SOLN
INTRAMUSCULAR | Status: AC
  Filled 2018-07-17: qty 1

## 2018-07-17 MED ORDER — LEUCOVORIN CALCIUM INJECTION 350 MG
400.0000 mg/m2 | Freq: Once | INTRAVENOUS | Status: AC
  Administered 2018-07-17: 840 mg via INTRAVENOUS
  Filled 2018-07-17: qty 42

## 2018-07-17 NOTE — Telephone Encounter (Signed)
Printed calendar and avs. °

## 2018-07-17 NOTE — Patient Instructions (Signed)
[place] Discharge Instructions for Patients Receiving Chemotherapy  Today you received the following chemotherapy agents oxaliplatin, irinotecan, leucovorin, and 5FU  To help prevent nausea and vomiting after your treatment, we encourage you to take your nausea medication as directed   If you develop nausea and vomiting that is not controlled by your nausea medication, call the clinic.   BELOW ARE SYMPTOMS THAT SHOULD BE REPORTED IMMEDIATELY:  *FEVER GREATER THAN 100.5 F  *CHILLS WITH OR WITHOUT FEVER  NAUSEA AND VOMITING THAT IS NOT CONTROLLED WITH YOUR NAUSEA MEDICATION  *UNUSUAL SHORTNESS OF BREATH  *UNUSUAL BRUISING OR BLEEDING  TENDERNESS IN MOUTH AND THROAT WITH OR WITHOUT PRESENCE OF ULCERS  *URINARY PROBLEMS  *BOWEL PROBLEMS  UNUSUAL RASH Items with * indicate a potential emergency and should be followed up as soon as possible.  Feel free to call the clinic should you have any questions or concerns. The clinic phone number is [phone].  Please show the [place] at check-in to the Emergency Department and triage nurse.

## 2018-07-17 NOTE — Progress Notes (Signed)
Nutrition follow-up completed with patient while he receives treatment for pancreas cancer. Patient denies nutrition impact symptoms. Reports he feels well. Weight improved and documented as 200.7 pounds December 12.  Nutrition diagnosis: Food and nutrition related knowledge deficit has resolved.  Educated patient to continue strategies for adequate calorie and protein intake for weight maintenance and healing. Recommended he contact me for questions or concerns.  He has my contact information.  **Disclaimer: This note was dictated with voice recognition software. Similar sounding words can inadvertently be transcribed and this note may contain transcription errors which may not have been corrected upon publication of note.**

## 2018-07-17 NOTE — Progress Notes (Signed)
Went to infusion to meet with patient and spouse to introduce myself as Arboriculturist and to offer available resources.  Asked about insurance DED/OOP and they state they have been met. Advised of copay assistance available via [place] if needed. They state their plan doesn't renew til July so they are good for now.  [place] complete is available for [place] as well.  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 Patient currently not able to work but spouse was able to provide her income. Approved patient for grant and gave a copy of the approval letter as well as the expense sheet. He received a gas card today from grant.  Gave my card for any additional financial questions or concerns.

## 2018-07-18 ENCOUNTER — Emergency Department (HOSPITAL_COMMUNITY)
Admission: EM | Admit: 2018-07-18 | Discharge: 2018-07-18 | Disposition: A | Discharge status: Home / Self Care | Payer: Managed Care, Other (non HMO) | Attending: Emergency Medicine | Admitting: Emergency Medicine

## 2018-07-18 ENCOUNTER — Emergency Department (HOSPITAL_COMMUNITY): Payer: Managed Care, Other (non HMO)

## 2018-07-18 ENCOUNTER — Other Ambulatory Visit: Payer: Self-pay

## 2018-07-18 ENCOUNTER — Encounter (HOSPITAL_COMMUNITY): Payer: Self-pay

## 2018-07-18 DIAGNOSIS — J069 Acute upper respiratory infection, unspecified: Secondary | ICD-10-CM | POA: Diagnosis not present

## 2018-07-18 DIAGNOSIS — R0602 Shortness of breath: Secondary | ICD-10-CM | POA: Diagnosis present

## 2018-07-18 DIAGNOSIS — Z79899 Other long term (current) drug therapy: Secondary | ICD-10-CM | POA: Insufficient documentation

## 2018-07-18 DIAGNOSIS — F17200 Nicotine dependence, unspecified, uncomplicated: Secondary | ICD-10-CM | POA: Insufficient documentation

## 2018-07-18 LAB — CBC WITH DIFFERENTIAL/PLATELET
Abs Immature Granulocytes: 0.03 10*3/uL (ref 0.00–0.07)
BASOS PCT: 1 %
Basophils Absolute: 0 10*3/uL (ref 0.0–0.1)
Eosinophils Absolute: 0.2 10*3/uL (ref 0.0–0.5)
Eosinophils Relative: 6 %
HCT: 42 % (ref 39.0–52.0)
Hemoglobin: 13.9 g/dL (ref 13.0–17.0)
Immature Granulocytes: 1 %
Lymphocytes Relative: 14 %
Lymphs Abs: 0.5 10*3/uL — ABNORMAL LOW (ref 0.7–4.0)
MCH: 30 pg (ref 26.0–34.0)
MCHC: 33.1 g/dL (ref 30.0–36.0)
MCV: 90.5 fL (ref 80.0–100.0)
Monocytes Absolute: 0.4 10*3/uL (ref 0.1–1.0)
Monocytes Relative: 10 %
Neutro Abs: 2.7 10*3/uL (ref 1.7–7.7)
Neutrophils Relative %: 68 %
Platelets: 97 10*3/uL — ABNORMAL LOW (ref 150–400)
RBC: 4.64 MIL/uL (ref 4.22–5.81)
RDW: 13.2 % (ref 11.5–15.5)
WBC: 3.8 10*3/uL — ABNORMAL LOW (ref 4.0–10.5)
nRBC: 0 % (ref 0.0–0.2)

## 2018-07-18 LAB — BASIC METABOLIC PANEL
Anion gap: 6 (ref 5–15)
BUN: 12 mg/dL (ref 6–20)
CALCIUM: 8.6 mg/dL — AB (ref 8.9–10.3)
CO2: 27 mmol/L (ref 22–32)
Chloride: 104 mmol/L (ref 98–111)
Creatinine, Ser: 0.92 mg/dL (ref 0.61–1.24)
GFR calc Af Amer: >60 mL/min (ref >60)
GFR calc non Af Amer: >60 mL/min (ref >60)
Glucose, Bld: 104 mg/dL — ABNORMAL HIGH (ref 70–99)
Potassium: 3.7 mmol/L (ref 3.5–5.1)
Sodium: 137 mmol/L (ref 135–145)

## 2018-07-18 LAB — CANCER ANTIGEN 19-9: CAN 19-9: 26 U/mL (ref 0–35)

## 2018-07-18 LAB — I-STAT CG4 LACTIC ACID, ED: Lactic Acid, Venous: 0.64 mmol/L (ref 0.5–1.9)

## 2018-07-18 NOTE — ED Triage Notes (Signed)
Pt coming from home c/o shortness of breath and congestion that started last night.   Hx of pancreatic cancer. Currently having chemo in [place].

## 2018-07-18 NOTE — ED Notes (Signed)
Pt verbalized discharge instructions and follow up care. Pt leaving with family

## 2018-07-18 NOTE — ED Notes (Signed)
Patient transported to X-ray 

## 2018-07-18 NOTE — ED Provider Notes (Addendum)
[facility] DEPT Provider Note   CSN: [id number] Arrival date & time: 07/18/18  2014     History   Chief Complaint Chief Complaint  Patient presents with  . Nasal Congestion  . Shortness of Breath    HPI Ernest Wu is a 57 y.o. male.  57 year old male with history of pancreatic cancer who is currently receiving continuous IV chemotherapy presents with cough congestion x24 hours.  He is also had nasal congestion as well to.  Cough is been nonproductive and not associate with vomiting or diarrhea.  Temp at home yesterday was 101.  Self medicated with antipyretics.  Called his doctor this evening due to increased nasal congestion without postnasal drip.  Physician informed him to come here.     Past Medical History:  Diagnosis Date  . Cancer ([place]) 06/2018   Pancreatic  . GERD (gastroesophageal reflux disease)     Patient Active Problem List   Diagnosis Date Noted  . Port-A-Cath in place 07/17/2018  . Adenocarcinoma of pancreas ([place]) 06/27/2018  . Pancreatic mass   . Tobacco use   . Direct hyperbilirubinemia   . Jaundice 06/07/2018    Past Surgical History:  Procedure Laterality Date  . BILIARY STENT PLACEMENT  06/10/2018   Procedure: BILIARY STENT PLACEMENT;  Surgeon: Ronnette Juniper, MD;  Location: [facility] ENDOSCOPY;  Service: Gastroenterology;;  . ERCP N/A 06/10/2018   Procedure: ENDOSCOPIC RETROGRADE CHOLANGIOPANCREATOGRAPHY (ERCP);  Surgeon: Ronnette Juniper, MD;  Location: [place];  Service: Gastroenterology;  Laterality: N/A;  . ESOPHAGOGASTRODUODENOSCOPY (EGD) WITH PROPOFOL N/A 06/10/2018   Procedure: ESOPHAGOGASTRODUODENOSCOPY (EGD) WITH PROPOFOL;  Surgeon: Ronnette Juniper, MD;  Location: [place];  Service: Gastroenterology;  Laterality: N/A;  . FINE NEEDLE ASPIRATION  06/10/2018   Procedure: FINE NEEDLE ASPIRATION (FNA) LINEAR;  Surgeon: Ronnette Juniper, MD;  Location: [facility] ENDOSCOPY;  Service: Gastroenterology;;  . Sol Passer PLACEMENT N/A  07/02/2018   Procedure: INSERTION PORT-A-CATH;  Surgeon: Stark Klein, MD;  Location: [place];  Service: General;  Laterality: N/A;  . SPHINCTEROTOMY  06/10/2018   Procedure: SPHINCTEROTOMY;  Surgeon: Ronnette Juniper, MD;  Location: [facility] ENDOSCOPY;  Service: Gastroenterology;;  . UPPER ESOPHAGEAL ENDOSCOPIC ULTRASOUND (EUS) N/A 06/10/2018   Procedure: UPPER ESOPHAGEAL ENDOSCOPIC ULTRASOUND (EUS);  Surgeon: Ronnette Juniper, MD;  Location: [place];  Service: Gastroenterology;  Laterality: N/A;  . VASECTOMY          Home Medications    Prior to Admission medications   Medication Sig Start Date End Date Taking? Authorizing Provider  ALPRAZolam (XANAX) 0.25 MG tablet Take 1 tablet (0.25 mg total) by mouth at bedtime as needed for anxiety. 07/07/18   [name], MD  diphenoxylate-atropine (LOMOTIL) 2.5-0.025 MG tablet Take 2 tablets by mouth 4 (four) times daily as needed for diarrhea or loose stools. 06/27/18   Alla Feeling, NP  esomeprazole (NEXIUM) 20 MG capsule Take 20 mg by mouth daily.     [provider]  folic acid (FOLVITE) 1 MG tablet Take 1 tablet (1 mg total) by mouth daily. 07/07/18 08/06/18  [name], MD  ibuprofen (ADVIL,MOTRIN) 200 MG tablet Take 400 mg by mouth daily as needed for headache or moderate pain.     [provider]  lidocaine-prilocaine (EMLA) cream Apply to affected area once 06/29/18   [name], MD  ondansetron (ZOFRAN) 8 MG tablet Take 1 tablet (8 mg total) by mouth 2 (two) times daily as needed for refractory nausea / vomiting. Start on day 3 after chemotherapy. 06/29/18  [name], MD  oxyCODONE (OXY IR/ROXICODONE) 5 MG immediate release tablet Take 1 tablet (5 mg total) by mouth every 6 (six) hours as needed for severe pain. 07/02/18   Stark Klein, MD  prochlorperazine (COMPAZINE) 10 MG tablet Take 1 tablet (10 mg total) by mouth every 6 (six) hours as needed (NAUSEA). 06/29/18   [name], MD  simethicone (MYLICON) 034 MG chewable tablet Chew 125 mg  by mouth every 6 (six) hours as needed for flatulence.    [provider]    Family History No family history on file.  Social History Social History   Tobacco Use  . Smoking status: Current Every Day Smoker    Packs/day: 0.25    Years: 30.00    Pack years: 7.50  . Smokeless tobacco: Never Used  . Tobacco comment: cut back considerably  Substance Use Topics  . Alcohol use: Yes    Alcohol/week: 21.0 standard drinks    Types: 21 Cans of beer per week  . Drug use: Never     Allergies   Contrast media [iodinated diagnostic agents] and Iohexol   Review of Systems Review of Systems  All other systems reviewed and are negative.    Physical Exam Updated Vital Signs BP (!) 142/92 (BP Location: Left Arm)   Pulse 80   Temp 97.9 F (36.6 C) (Oral)   Resp (!) 21   Ht 1.829 m (6')   Wt 89.4 kg   SpO2 98%   BMI 26.72 kg/m   Physical Exam Vitals signs and nursing note reviewed.  Constitutional:      General: He is not in acute distress.    Appearance: Normal appearance. He is well-developed. He is not toxic-appearing.  HENT:     Head: Normocephalic and atraumatic.  Eyes:     General: Lids are normal.     Conjunctiva/sclera: Conjunctivae normal.     Pupils: Pupils are equal, round, and reactive to light.  Neck:     Musculoskeletal: Normal range of motion and neck supple.     Thyroid: No thyroid mass.     Trachea: No tracheal deviation.  Cardiovascular:     Rate and Rhythm: Normal rate and regular rhythm.     Heart sounds: Normal heart sounds. No murmur. No gallop.   Pulmonary:     Effort: Pulmonary effort is normal. No respiratory distress.     Breath sounds: Normal breath sounds. No stridor. No decreased breath sounds, wheezing, rhonchi or rales.  Abdominal:     General: Bowel sounds are normal. There is no distension.     Palpations: Abdomen is soft.     Tenderness: There is no abdominal tenderness. There is no rebound.  Musculoskeletal: Normal range  of motion.        General: No tenderness.  Skin:    General: Skin is warm and dry.     Findings: No abrasion or rash.  Neurological:     Mental Status: He is alert and oriented to person, place, and time.     GCS: GCS eye subscore is 4. GCS verbal subscore is 5. GCS motor subscore is 6.     Cranial Nerves: No cranial nerve deficit.     Sensory: No sensory deficit.  Psychiatric:        Speech: Speech normal.        Behavior: Behavior normal.      ED Treatments / Results  Labs (all labs ordered are listed, but only abnormal results are displayed)  Labs Reviewed  CBC WITH DIFFERENTIAL/PLATELET  BASIC METABOLIC PANEL  I-STAT CG4 LACTIC ACID, ED    EKG None  Radiology No results found.  Procedures Procedures (including critical care time)  Medications Ordered in ED Medications - No data to display   Initial Impression / Assessment and Plan / ED Course  I have reviewed the triage vital signs and the nursing notes.  Pertinent labs & imaging results that were available during my care of the patient were reviewed by me and considered in my medical decision making (see chart for details).     Chest x-ray is negative.  Urinalysis negative as well too.  He is afebrile here.  No evidence of neutropenia on his CBC.  Lactate normal.  Suspect viral illness and patient stable for discharge  Final Clinical Impressions(s) / ED Diagnoses   Final diagnoses:  None    ED Discharge Orders    None       Lacretia Leigh, MD 07/18/18 2219    Lacretia Leigh, MD 07/18/18 2219

## 2018-07-19 ENCOUNTER — Inpatient Hospital Stay: Payer: Managed Care, Other (non HMO)

## 2018-07-19 VITALS — BP 129/76 | HR 74 | Temp 97.8°F | Resp 16

## 2018-07-19 DIAGNOSIS — C259 Malignant neoplasm of pancreas, unspecified: Secondary | ICD-10-CM

## 2018-07-19 DIAGNOSIS — C257 Malignant neoplasm of other parts of pancreas: Secondary | ICD-10-CM | POA: Diagnosis not present

## 2018-07-19 MED ORDER — HEPARIN SOD (PORK) LOCK FLUSH 100 UNIT/ML IV SOLN
500.0000 [IU] | Freq: Once | INTRAVENOUS | Status: AC | PRN
  Administered 2018-07-19: 500 [IU]
  Filled 2018-07-19: qty 5

## 2018-07-19 MED ORDER — PEGFILGRASTIM INJECTION 6 MG/0.6ML ~~LOC~~
PREFILLED_SYRINGE | SUBCUTANEOUS | Status: AC
  Filled 2018-07-19: qty 0.6

## 2018-07-19 MED ORDER — PEGFILGRASTIM-CBQV 6 MG/0.6ML ~~LOC~~ SOSY
6.0000 mg | PREFILLED_SYRINGE | Freq: Once | SUBCUTANEOUS | Status: AC
  Administered 2018-07-19: 6 mg via SUBCUTANEOUS

## 2018-07-19 MED ORDER — SODIUM CHLORIDE 0.9% FLUSH
10.0000 mL | INTRAVENOUS | Status: DC | PRN
  Administered 2018-07-19: 10 mL
  Filled 2018-07-19: qty 10

## 2018-07-19 NOTE — Patient Instructions (Addendum)
Compazine- any time (every 6 hours) Zofran- start Sunday (every 12 hours)  Pegfilgrastim injection What is this medicine? PEGFILGRASTIM (PEG fil gra stim) is a long-acting granulocyte colony-stimulating factor that stimulates the growth of neutrophils, a type of white blood cell important in the body's fight against infection. It is used to reduce the incidence of fever and infection in patients with certain types of cancer who are receiving chemotherapy that affects the bone marrow, and to increase survival after being exposed to high doses of radiation. This medicine may be used for other purposes; ask your health care provider or pharmacist if you have questions. COMMON BRAND NAME(S): Neulasta What should I tell my health care provider before I take this medicine? They need to know if you have any of these conditions: -kidney disease -latex allergy -ongoing radiation therapy -sickle cell disease -skin reactions to acrylic adhesives (On-Body Injector only) -an unusual or allergic reaction to pegfilgrastim, filgrastim, other medicines, foods, dyes, or preservatives -pregnant or trying to get pregnant -breast-feeding How should I use this medicine? This medicine is for injection under the skin. If you get this medicine at home, you will be taught how to prepare and give the pre-filled syringe or how to use the On-body Injector. Refer to the patient Instructions for Use for detailed instructions. Use exactly as directed. Tell your healthcare provider immediately if you suspect that the On-body Injector may not have performed as intended or if you suspect the use of the On-body Injector resulted in a missed or partial dose. It is important that you put your used needles and syringes in a special sharps container. Do not put them in a trash can. If you do not have a sharps container, call your pharmacist or healthcare provider to get one. Talk to your pediatrician regarding the use of this medicine  in children. While this drug may be prescribed for selected conditions, precautions do apply. Overdosage: If you think you have taken too much of this medicine contact a poison control center or emergency room at once. NOTE: This medicine is only for you. Do not share this medicine with others. What if I miss a dose? It is important not to miss your dose. Call your doctor or health care professional if you miss your dose. If you miss a dose due to an On-body Injector failure or leakage, a new dose should be administered as soon as possible using a single prefilled syringe for manual use. What may interact with this medicine? Interactions have not been studied. Give your health care provider a list of all the medicines, herbs, non-prescription drugs, or dietary supplements you use. Also tell them if you smoke, drink alcohol, or use illegal drugs. Some items may interact with your medicine. This list may not describe all possible interactions. Give your health care provider a list of all the medicines, herbs, non-prescription drugs, or dietary supplements you use. Also tell them if you smoke, drink alcohol, or use illegal drugs. Some items may interact with your medicine. What should I watch for while using this medicine? You may need blood work done while you are taking this medicine. If you are going to need a MRI, CT scan, or other procedure, tell your doctor that you are using this medicine (On-Body Injector only). What side effects may I notice from receiving this medicine? Side effects that you should report to your doctor or health care professional as soon as possible: -allergic reactions like skin rash, itching or hives, swelling of  the face, lips, or tongue -dizziness -fever -pain, redness, or irritation at site where injected -pinpoint red spots on the skin -red or dark-brown urine -shortness of breath or breathing problems -stomach or side pain, or pain at the  shoulder -swelling -tiredness -trouble passing urine or change in the amount of urine Side effects that usually do not require medical attention (report to your doctor or health care professional if they continue or are bothersome): -bone pain -muscle pain This list may not describe all possible side effects. Call your doctor for medical advice about side effects. You may report side effects to FDA at 1-800-FDA-1088. Where should I keep my medicine? Keep out of the reach of children. Store pre-filled syringes in a refrigerator between 2 and 8 degrees C (36 and 46 degrees F). Do not freeze. Keep in carton to protect from light. Throw away this medicine if it is left out of the refrigerator for more than 48 hours. Throw away any unused medicine after the expiration date. NOTE: This sheet is a summary. It may not cover all possible information. If you have questions about this medicine, talk to your doctor, pharmacist, or health care provider.  2018 Elsevier/Gold Standard (2016-07-19 12:58:03)

## 2018-07-24 ENCOUNTER — Ambulatory Visit (HOSPITAL_COMMUNITY)
Admission: RE | Admit: 2018-07-24 | Discharge: 2018-07-24 | Disposition: A | Discharge status: Home / Self Care | Payer: Managed Care, Other (non HMO) | Source: Ambulatory Visit | Attending: Hematology | Admitting: Hematology

## 2018-07-24 ENCOUNTER — Other Ambulatory Visit: Payer: Self-pay | Admitting: Hematology

## 2018-07-24 ENCOUNTER — Encounter (HOSPITAL_COMMUNITY): Payer: Self-pay | Admitting: Radiology

## 2018-07-24 DIAGNOSIS — I700 Atherosclerosis of aorta: Secondary | ICD-10-CM | POA: Insufficient documentation

## 2018-07-24 DIAGNOSIS — D490 Neoplasm of unspecified behavior of digestive system: Secondary | ICD-10-CM

## 2018-07-24 DIAGNOSIS — I513 Intracardiac thrombosis, not elsewhere classified: Secondary | ICD-10-CM | POA: Diagnosis not present

## 2018-07-24 MED ORDER — GADOBUTROL 1 MMOL/ML IV SOLN
8.0000 mL | Freq: Once | INTRAVENOUS | Status: AC | PRN
  Administered 2018-07-24: 8 mL via INTRAVENOUS

## 2018-07-28 ENCOUNTER — Telehealth: Payer: Self-pay

## 2018-07-28 NOTE — Telephone Encounter (Signed)
-----   Message from [name], MD sent at 07/27/2018  2:15 PM EST ----- Please let pt know his MRI results. His pancreatic tumor has slightly reduced in size, which is good, and liver lesions are probably benign, not related to his pancreatic cancer. Wish him Merry Christmas and we will see him after the holiday. Thanks  [name]  07/27/2018

## 2018-07-28 NOTE — Telephone Encounter (Signed)
Spoke with patient to let him know MRI results.  Per Dr. Burr Medico informed him pancreatic tumor has slightly reduced in size, which is good news, liver lesions are probably benign not related to pancreatic cancer.  Patient verbalized an understanding and appreciated the call.

## 2018-07-29 ENCOUNTER — Telehealth: Payer: Self-pay | Admitting: *Deleted

## 2018-07-29 ENCOUNTER — Other Ambulatory Visit: Payer: Self-pay

## 2018-07-29 ENCOUNTER — Telehealth: Payer: Self-pay

## 2018-07-29 DIAGNOSIS — C259 Malignant neoplasm of pancreas, unspecified: Secondary | ICD-10-CM

## 2018-07-29 MED ORDER — AMOXICILLIN-POT CLAVULANATE 875-125 MG PO TABS: 1.0000 | ORAL_TABLET | Freq: Two times a day (BID) | ORAL | 0 refills | Status: DC

## 2018-07-29 NOTE — Telephone Encounter (Signed)
"  Ramez Arrona Sedlacek's wife [name] [phone]).  My husband is having left ear drainage and sore throat.  What can he take over the counter?  Can't come in because we live in [place].  He does not have a fever.  Coughing brings up a little green so he thinks he has a sinus infection.  No headache, pain or pressure.  Yes, there may be an Urgent Care but please message Dr. Burr Medico, she may be able to order an antibiotic."

## 2018-07-29 NOTE — Telephone Encounter (Signed)
Spoke with [name] - patient's wife will send in Augmentin to [facility] in [place], he will take 1 tablet twice a day for 7 days, she verbalized an understanding.

## 2018-08-01 ENCOUNTER — Inpatient Hospital Stay: Payer: Managed Care, Other (non HMO)

## 2018-08-01 ENCOUNTER — Inpatient Hospital Stay (HOSPITAL_BASED_OUTPATIENT_CLINIC_OR_DEPARTMENT_OTHER): Payer: Managed Care, Other (non HMO) | Admitting: Medical

## 2018-08-01 VITALS — BP 129/82 | HR 72 | Temp 97.8°F | Resp 17 | Ht 72.0 in | Wt 202.7 lb

## 2018-08-01 DIAGNOSIS — K219 Gastro-esophageal reflux disease without esophagitis: Secondary | ICD-10-CM

## 2018-08-01 DIAGNOSIS — I700 Atherosclerosis of aorta: Secondary | ICD-10-CM

## 2018-08-01 DIAGNOSIS — F17210 Nicotine dependence, cigarettes, uncomplicated: Secondary | ICD-10-CM

## 2018-08-01 DIAGNOSIS — C257 Malignant neoplasm of other parts of pancreas: Secondary | ICD-10-CM | POA: Diagnosis not present

## 2018-08-01 DIAGNOSIS — Z95828 Presence of other vascular implants and grafts: Secondary | ICD-10-CM | POA: Diagnosis not present

## 2018-08-01 DIAGNOSIS — T451X5S Adverse effect of antineoplastic and immunosuppressive drugs, sequela: Secondary | ICD-10-CM

## 2018-08-01 DIAGNOSIS — R918 Other nonspecific abnormal finding of lung field: Secondary | ICD-10-CM | POA: Diagnosis not present

## 2018-08-01 DIAGNOSIS — I2510 Atherosclerotic heart disease of native coronary artery without angina pectoris: Secondary | ICD-10-CM

## 2018-08-01 DIAGNOSIS — Z79899 Other long term (current) drug therapy: Secondary | ICD-10-CM

## 2018-08-01 DIAGNOSIS — Z791 Long term (current) use of non-steroidal anti-inflammatories (NSAID): Secondary | ICD-10-CM

## 2018-08-01 DIAGNOSIS — I712 Thoracic aortic aneurysm, without rupture: Secondary | ICD-10-CM

## 2018-08-01 DIAGNOSIS — C259 Malignant neoplasm of pancreas, unspecified: Secondary | ICD-10-CM

## 2018-08-01 DIAGNOSIS — K769 Liver disease, unspecified: Secondary | ICD-10-CM

## 2018-08-01 DIAGNOSIS — D6959 Other secondary thrombocytopenia: Secondary | ICD-10-CM

## 2018-08-01 LAB — CBC WITH DIFFERENTIAL (CANCER CENTER ONLY)
Abs Immature Granulocytes: 0.05 10*3/uL (ref 0.00–0.07)
BASOS ABS: 0 10*3/uL (ref 0.0–0.1)
Basophils Relative: 1 %
Eosinophils Absolute: 0.2 10*3/uL (ref 0.0–0.5)
Eosinophils Relative: 2 %
HCT: 41.6 % (ref 39.0–52.0)
Hemoglobin: 13.6 g/dL (ref 13.0–17.0)
Immature Granulocytes: 1 %
Lymphocytes Relative: 19 %
Lymphs Abs: 1.4 10*3/uL (ref 0.7–4.0)
MCH: 29.6 pg (ref 26.0–34.0)
MCHC: 32.7 g/dL (ref 30.0–36.0)
MCV: 90.4 fL (ref 80.0–100.0)
Monocytes Absolute: 0.6 10*3/uL (ref 0.1–1.0)
Monocytes Relative: 8 %
NRBC: 0 % (ref 0.0–0.2)
Neutro Abs: 5 10*3/uL (ref 1.7–7.7)
Neutrophils Relative %: 69 %
Platelet Count: 147 10*3/uL — ABNORMAL LOW (ref 150–400)
RBC: 4.6 MIL/uL (ref 4.22–5.81)
RDW: 14 % (ref 11.5–15.5)
WBC Count: 7.2 10*3/uL (ref 4.0–10.5)

## 2018-08-01 LAB — CMP (CANCER CENTER ONLY)
ALT: 47 U/L — ABNORMAL HIGH (ref 0–44)
ANION GAP: 9 (ref 5–15)
AST: 24 U/L (ref 15–41)
Albumin: 3.4 g/dL — ABNORMAL LOW (ref 3.5–5.0)
Alkaline Phosphatase: 138 U/L — ABNORMAL HIGH (ref 38–126)
BUN: 8 mg/dL (ref 6–20)
CO2: 26 mmol/L (ref 22–32)
Calcium: 9 mg/dL (ref 8.9–10.3)
Chloride: 108 mmol/L (ref 98–111)
Creatinine: 0.83 mg/dL (ref 0.61–1.24)
GFR, Estimated: >60 mL/min (ref >60)
Glucose, Bld: 142 mg/dL — ABNORMAL HIGH (ref 70–99)
Potassium: 3.9 mmol/L (ref 3.5–5.1)
Sodium: 143 mmol/L (ref 135–145)
Total Bilirubin: 0.4 mg/dL (ref 0.3–1.2)
Total Protein: 6.2 g/dL — ABNORMAL LOW (ref 6.5–8.1)

## 2018-08-01 MED ORDER — SODIUM CHLORIDE 0.9% FLUSH
10.0000 mL | Freq: Once | INTRAVENOUS | Status: AC
  Administered 2018-08-01: 10 mL
  Filled 2018-08-01: qty 10

## 2018-08-01 MED ORDER — HEPARIN SOD (PORK) LOCK FLUSH 100 UNIT/ML IV SOLN
500.0000 [IU] | Freq: Once | INTRAVENOUS | Status: AC
  Administered 2018-08-01: 500 [IU]
  Filled 2018-08-01: qty 5

## 2018-08-01 NOTE — Progress Notes (Signed)
Ok to treat on 12/28 per MD Feng/PA Lucianne Lei, labs reviewed with MD.  Pt's port left accessed per pt request, appropriate dressing & antimicrobial disc in place.  Dressing reinforced.  Saline flushed, heparin flushed & locked.  Pt verbalized understanding of infusion time on 12/28.

## 2018-08-01 NOTE — Patient Instructions (Signed)

## 2018-08-02 ENCOUNTER — Inpatient Hospital Stay: Payer: Managed Care, Other (non HMO)

## 2018-08-02 VITALS — BP 124/86 | HR 75 | Temp 98.3°F | Resp 18

## 2018-08-02 DIAGNOSIS — C257 Malignant neoplasm of other parts of pancreas: Secondary | ICD-10-CM | POA: Diagnosis not present

## 2018-08-02 LAB — CANCER ANTIGEN 19-9: CA 19-9: 19 U/mL (ref 0–35)

## 2018-08-02 MED ORDER — DEXAMETHASONE SODIUM PHOSPHATE 10 MG/ML IJ SOLN
10.0000 mg | Freq: Once | INTRAMUSCULAR | Status: AC
  Administered 2018-08-02: 10 mg via INTRAVENOUS

## 2018-08-02 MED ORDER — SODIUM CHLORIDE 0.9 % IV SOLN
2370.0000 mg/m2 | INTRAVENOUS | Status: DC
  Administered 2018-08-02: 5000 mg via INTRAVENOUS
  Filled 2018-08-02: qty 100

## 2018-08-02 MED ORDER — LEUCOVORIN CALCIUM INJECTION 350 MG
400.0000 mg/m2 | Freq: Once | INTRAVENOUS | Status: AC
  Administered 2018-08-02: 840 mg via INTRAVENOUS
  Filled 2018-08-02: qty 25

## 2018-08-02 MED ORDER — DEXTROSE 5 % IV SOLN
Freq: Once | INTRAVENOUS | Status: DC
  Filled 2018-08-02: qty 250

## 2018-08-02 MED ORDER — SODIUM CHLORIDE 0.9 % IV SOLN
INTRAVENOUS | Status: DC
  Filled 2018-08-02: qty 250

## 2018-08-02 MED ORDER — IRINOTECAN HCL CHEMO INJECTION 100 MG/5ML
150.0000 mg/m2 | Freq: Once | INTRAVENOUS | Status: AC
  Administered 2018-08-02: 320 mg via INTRAVENOUS
  Filled 2018-08-02: qty 5

## 2018-08-02 MED ORDER — PALONOSETRON HCL INJECTION 0.25 MG/5ML
INTRAVENOUS | Status: AC
  Filled 2018-08-02: qty 5

## 2018-08-02 MED ORDER — SODIUM CHLORIDE 0.9 % IV SOLN
INTRAVENOUS | Status: DC
  Administered 2018-08-02 (×2): via INTRAVENOUS
  Filled 2018-08-02: qty 250

## 2018-08-02 MED ORDER — DEXTROSE 5 % IV SOLN
Freq: Once | INTRAVENOUS | Status: AC
  Administered 2018-08-02: 10:00:00 via INTRAVENOUS
  Filled 2018-08-02: qty 250

## 2018-08-02 MED ORDER — OXALIPLATIN CHEMO INJECTION 100 MG/20ML
85.0000 mg/m2 | Freq: Once | INTRAVENOUS | Status: AC
  Administered 2018-08-02: 180 mg via INTRAVENOUS
  Filled 2018-08-02: qty 36

## 2018-08-02 MED ORDER — PALONOSETRON HCL INJECTION 0.25 MG/5ML
0.2500 mg | Freq: Once | INTRAVENOUS | Status: AC
  Administered 2018-08-02: 0.25 mg via INTRAVENOUS

## 2018-08-02 MED ORDER — DEXAMETHASONE SODIUM PHOSPHATE 10 MG/ML IJ SOLN
INTRAMUSCULAR | Status: AC
  Filled 2018-08-02: qty 1

## 2018-08-02 NOTE — Patient Instructions (Signed)
[facility] Discharge Instructions for Patients Receiving Chemotherapy  Today you received the following chemotherapy agents :  Oxaliplatin,  Irinotecan, Leucovorin, Fluorouracil.  To help prevent nausea and vomiting after your treatment, we encourage you to take your nausea medication as prescribed.   If you develop nausea and vomiting that is not controlled by your nausea medication, call the clinic.   BELOW ARE SYMPTOMS THAT SHOULD BE REPORTED IMMEDIATELY:  *FEVER GREATER THAN 100.5 F  *CHILLS WITH OR WITHOUT FEVER  NAUSEA AND VOMITING THAT IS NOT CONTROLLED WITH YOUR NAUSEA MEDICATION  *UNUSUAL SHORTNESS OF BREATH  *UNUSUAL BRUISING OR BLEEDING  TENDERNESS IN MOUTH AND THROAT WITH OR WITHOUT PRESENCE OF ULCERS  *URINARY PROBLEMS  *BOWEL PROBLEMS  UNUSUAL RASH Items with * indicate a potential emergency and should be followed up as soon as possible.  Feel free to call the clinic should you have any questions or concerns. The clinic phone number is [phone].  Please show the CHEMO ALERT CARD at check-in to the Emergency Department and triage nurse.   

## 2018-08-04 ENCOUNTER — Inpatient Hospital Stay: Payer: Managed Care, Other (non HMO)

## 2018-08-04 DIAGNOSIS — C259 Malignant neoplasm of pancreas, unspecified: Secondary | ICD-10-CM

## 2018-08-04 DIAGNOSIS — C257 Malignant neoplasm of other parts of pancreas: Secondary | ICD-10-CM | POA: Diagnosis not present

## 2018-08-04 MED ORDER — PEGFILGRASTIM-CBQV 6 MG/0.6ML ~~LOC~~ SOSY
6.0000 mg | PREFILLED_SYRINGE | Freq: Once | SUBCUTANEOUS | Status: AC
  Administered 2018-08-04: 6 mg via SUBCUTANEOUS

## 2018-08-04 MED ORDER — HEPARIN SOD (PORK) LOCK FLUSH 100 UNIT/ML IV SOLN
500.0000 [IU] | Freq: Once | INTRAVENOUS | Status: AC | PRN
  Administered 2018-08-04: 500 [IU]
  Filled 2018-08-04: qty 5

## 2018-08-04 MED ORDER — SODIUM CHLORIDE 0.9% FLUSH
10.0000 mL | INTRAVENOUS | Status: DC | PRN
  Administered 2018-08-04: 10 mL
  Filled 2018-08-04: qty 10

## 2018-08-04 MED ORDER — PEGFILGRASTIM-CBQV 6 MG/0.6ML ~~LOC~~ SOSY
PREFILLED_SYRINGE | SUBCUTANEOUS | Status: AC
  Filled 2018-08-04: qty 0.6

## 2018-08-07 NOTE — Progress Notes (Signed)
[place]   [phone] Fax:([phone]) [phone]   Clinic Follow up Note   Patient Care Team: Street, [name], MD as PCP - General (Family Medicine) 08/07/2018  CHIEF COMPLAINT: F/u on pancreatic cancer   SUMMARY OF ONCOLOGIC HISTORY: Oncology History   Cancer Staging Adenocarcinoma of pancreas [facility]) Staging form: Exocrine Pancreas, AJCC 8th Edition - Clinical stage from 06/10/2018: Stage IB (cT2, cN0, cM0) - Signed by [name], MD on 06/29/2018       Adenocarcinoma of pancreas ([place])   06/07/2018 Imaging    CT AP w Contrast IMPRESSION: 1. There is a low attenuation mass centered around the neck of pancreas which is concerning for neoplasm. Pancreatic adenocarcinoma favored. This results in common bile duct obstruction with mild intrahepatic biliary ductal dilatation. There also is involvement of the portal venous confluence. Further evaluation with nonemergent contrast enhanced MRI of the pancreas is recommended. 2. Small indeterminate low-attenuation structure is noted within segment 4 a of the liver. This could be better addressed at MRI.    06/09/2018 Imaging    MR ABD MRCP  The pancreatic mass involves approximately 40 percent of the main portal vein circumference at the portal splenic venous confluence, with associated mild narrowing of the portal splenic venous confluence. The SMV, splenic vein and main, right and left portal veins remain patent. The celiac trunk and SMA are not involved by the pancreatic mass.  IMPRESSION: 2.3 cm diameter hypoenhancing mass at the junction of the head and neck of pancreas with pancreatic ductal dilatation, likely adenocarcinoma.  Intra and extrahepatic bile duct dilatation with abrupt change in caliber at the mid common bile duct. This could indicate an obstructing mass or stricture.     06/10/2018 Initial Biopsy    Diagnosis PANCREAS, FINE NEEDLE ASPIRATION (SPECIMEN 1 OF 1 COLLECTED 06/10/18): MALIGNANT  CELLS CONSISTENT WITH ADENOCARCINOMA.    06/10/2018 Procedure    IMPRESSION: 1. 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 2. Placement of a metallic biliary stent.    06/10/2018 Procedure    EUS per Dr. Paulita Fujita Impression:  - There was dilation in the common bile duct which measured up to 12 mm. - A mass was identified in the pancreatic head. This was staged T3 N1 Mx by endosonographic criteria. Fine needle aspiration performed. - A few lymph nodes were visualized and measured in the peripancreatic region. - There was no evidence of significant pathology in the left lobe of the liver.    06/10/2018 Cancer Staging    Staging form: Exocrine Pancreas, AJCC 8th Edition - Clinical stage from 06/10/2018: Stage IB (cT2, cN0, cM0) - Signed by [name], MD on 06/29/2018    06/27/2018 Initial Diagnosis    Adenocarcinoma of pancreas ([place])    07/02/2018 Imaging    IMPRESSION: 1. Multiple small pulmonary nodules scattered throughout the lungs measuring up to 7 mm in size. These are nonspecific, but the possibility of metastatic disease should be considered, and close attention at time of routine followups is recommended. 2. In addition, today's study demonstrates new and enlarging low-attenuation lesions in the liver. This is poorly evaluated on today's noncontrast CT examination, but is concerning for potential metastatic disease. Further evaluation with repeat nonemergent MRI of the abdomen with and without IV gadolinium is suggested in the near future to better evaluate these findings. 3. Aortic atherosclerosis, in addition to left main and 2 vessel coronary artery disease. Please note that although the  presence of coronary artery calcium documents the presence of coronary artery disease, the severity of this disease and any potential stenosis cannot be assessed on this non-gated CT examination. Assessment for potential risk factor  modification, dietary therapy or pharmacologic therapy may be warranted, if clinically indicated. 4. Mild aneurysmal dilatation of the ascending thoracic aorta (4.8 cm in diameter). Ascending thoracic aortic aneurysm. Recommend semi-annual imaging followup by CTA or MRA and referral to cardiothoracic surgery if not already obtained. This recommendation follows 2010 ACCF/AHA/AATS/ACR/ASA/SCA/SCAI/SIR/STS/SVM Guidelines for the Diagnosis and Management of Patients With Thoracic Aortic Disease. Circulation. 2010; 121: Y606-T016.  Aortic Atherosclerosis (ICD10-I70.0). Aortic aneurysm NOS (ICD10-I71.9).    07/04/2018 -  Chemotherapy     FOLFIRINOX q2 weeks    07/15/2018 Imaging    07/15/2018 Liver US IMPRESSION: No liver lesions identified with ultrasound. Ultrasound-guided biopsy was not performed. Recommend further characterization for liver lesions with a repeat MRI, with and without contrast.     CURRENT THERAPY:  FOLFIRINOX q2 weeks, started on 07/05/2018   INTERVAL HISTORY: Ernest Wu presents to clinic today prior to cycle 3, day 1 of FOLFIRINOX which is scheduled to be dosed on 08/02/2018.  He is currently on Augmentin which he has been on since Christmas Eve.  This was given to him for drainage from his left ear.  He reports that the drainage and fullness of his left ear has now resolved.  He reports that he is doing well overall.  He denies fevers, chills, sweats, headaches, constipation, diarrhea, nausea, vomiting, or anorexia.   REVIEW OF SYSTEMS:   Review of Systems  Constitutional: Negative for chills, diaphoresis and fever.  HENT:   Negative for hearing loss.   Eyes: Negative for eye problems.  Respiratory: Negative for chest tightness, cough and shortness of breath.   Cardiovascular: Negative for chest pain.  Gastrointestinal: Negative for constipation, diarrhea, nausea and vomiting.  Genitourinary: Negative for difficulty urinating and dysuria.     Musculoskeletal: Negative for arthralgias.  Neurological: Negative for headaches.    MEDICAL HISTORY:  Past Medical History:  Diagnosis Date  . Cancer ([place]) 06/2018   Pancreatic  . GERD (gastroesophageal reflux disease)     SURGICAL HISTORY: Past Surgical History:  Procedure Laterality Date  . BILIARY STENT PLACEMENT  06/10/2018   Procedure: BILIARY STENT PLACEMENT;  Surgeon: Ronnette Juniper, MD;  Location[facility] ENDOSCOPY;  Service: Gastroenterology;;  . ERCP N/A 06/10/2018   Procedure: ENDOSCOPIC RETROGRADE CHOLANGIOPANCREATOGRAPHY (ERCP);  Surgeon: Ronnette Juniper, MD;  Location: [place];  Service: Gastroenterology;  Laterality: N/A;  . ESOPHAGOGASTRODUODENOSCOPY (EGD) WITH PROPOFOL N/A 06/10/2018   Procedure: ESOPHAGOGASTRODUODENOSCOPY (EGD) WITH PROPOFOL;  Surgeon: Ronnette Juniper, MD;  Location: [place];  Service: Gastroenterology;  Laterality: N/A;  . FINE NEEDLE ASPIRATION  06/10/2018   Procedure: FINE NEEDLE ASPIRATION (FNA) LINEAR;  Surgeon: Ronnette Juniper, MD;  Location: [facility] ENDOSCOPY;  Service: Gastroenterology;;  . Sol Passer PLACEMENT N/A 07/02/2018   Procedure: INSERTION PORT-A-CATH;  Surgeon: Stark Klein, MD;  Location: [place];  Service: General;  Laterality: N/A;  . SPHINCTEROTOMY  06/10/2018   Procedure: SPHINCTEROTOMY;  Surgeon: Ronnette Juniper, MD;  Location: [facility] ENDOSCOPY;  Service: Gastroenterology;;  . UPPER ESOPHAGEAL ENDOSCOPIC ULTRASOUND (EUS) N/A 06/10/2018   Procedure: UPPER ESOPHAGEAL ENDOSCOPIC ULTRASOUND (EUS);  Surgeon: Ronnette Juniper, MD;  Location: [place];  Service: Gastroenterology;  Laterality: N/A;  . VASECTOMY      I have reviewed the social history and family history with the patient and they are unchanged from previous note.  ALLERGIES:  is allergic to contrast media [iodinated diagnostic agents] and iohexol.  MEDICATIONS:  Current Outpatient Medications  Medication Sig Dispense Refill  . ALPRAZolam (XANAX) 0.25 MG tablet Take 1 tablet (0.25 mg total)  by mouth at bedtime as needed for anxiety. 30 tablet 0  . amoxicillin-clavulanate (AUGMENTIN) 875-125 MG tablet Take 1 tablet by mouth 2 (two) times daily. For 7 days. 14 tablet 0  . diphenoxylate-atropine (LOMOTIL) 2.5-0.025 MG tablet Take 2 tablets by mouth 4 (four) times daily as needed for diarrhea or loose stools. 40 tablet 0  . esomeprazole (NEXIUM) 20 MG capsule Take 20 mg by mouth daily.     Marland Kitchen ibuprofen (ADVIL,MOTRIN) 200 MG tablet Take 400 mg by mouth daily as needed for headache or moderate pain.     Marland Kitchen lidocaine-prilocaine (EMLA) cream Apply to affected area once 30 g 3  . ondansetron (ZOFRAN) 8 MG tablet Take 1 tablet (8 mg total) by mouth 2 (two) times daily as needed for refractory nausea / vomiting. Start on day 3 after chemotherapy. 30 tablet 1  . oxyCODONE (OXY IR/ROXICODONE) 5 MG immediate release tablet Take 1 tablet (5 mg total) by mouth every 6 (six) hours as needed for severe pain. 20 tablet 0  . prochlorperazine (COMPAZINE) 10 MG tablet Take 1 tablet (10 mg total) by mouth every 6 (six) hours as needed (NAUSEA). 30 tablet 1  . simethicone (MYLICON) 425 MG chewable tablet Chew 125 mg by mouth every 6 (six) hours as needed for flatulence.     No current facility-administered medications for this visit.    Facility-Administered Medications Ordered in Other Visits  Medication Dose Route Frequency Provider Last Rate Last Dose  . heparin lock flush 100 unit/mL  500 Units Intracatheter Once PRN [name], MD      . sodium chloride flush (NS) 0.9 % injection 10 mL  10 mL Intracatheter PRN [name], MD        PHYSICAL EXAMINATION: ECOG PERFORMANCE STATUS: 1 - Symptomatic but completely ambulatory  Vitals:   08/01/18 0849  BP: 129/82  Pulse: 72  Resp: 17  Temp: 97.8 F (36.6 C)  SpO2: 98%   Filed Weights   08/01/18 0849  Weight: 202 lb 11.2 oz (91.9 kg)   Physical Exam Constitutional:      General: He is not in acute distress.    Appearance: He is not diaphoretic.   HENT:     Head: Normocephalic and atraumatic.  Cardiovascular:     Rate and Rhythm: Normal rate and regular rhythm.     Heart sounds: Normal heart sounds. No murmur. No friction rub. No gallop.   Pulmonary:     Effort: Pulmonary effort is normal. No respiratory distress.     Breath sounds: Normal breath sounds. No wheezing or rales.  Skin:    General: Skin is warm and dry.     Findings: No erythema or rash.  Neurological:     Mental Status: He is alert.     LABORATORY DATA:  I have reviewed the data as listed CBC Latest Ref Rng & Units 08/01/2018 07/18/2018 07/17/2018  WBC 4.0 - 10.5 K/uL 7.2 3.8(L) 9.6  Hemoglobin 13.0 - 17.0 g/dL 13.6 13.9 13.9  Hematocrit 39.0 - 52.0 % 41.6 42.0 41.7  Platelets 150 - 400 K/uL 147(L) 97(L) 115(L)     CMP Latest Ref Rng & Units 08/01/2018 07/18/2018 07/17/2018  Glucose 70 - 99 mg/dL 142(H) 104(H) 77  BUN 6 - 20 mg/dL 8 12  11  Creatinine 0.61 - 1.24 mg/dL 0.83 0.92 0.93  Sodium 135 - 145 mmol/L 143 137 144  Potassium 3.5 - 5.1 mmol/L 3.9 3.7 4.4  Chloride 98 - 111 mmol/L 108 104 107  CO2 22 - 32 mmol/L 26 27 28   Calcium 8.9 - 10.3 mg/dL 9.0 8.6(L) 9.5  Total Protein 6.5 - 8.1 g/dL 6.2(L) - 6.8  Total Bilirubin 0.3 - 1.2 mg/dL 0.4 - 0.6  Alkaline Phos 38 - 126 U/L 138(H) - 162(H)  AST 15 - 41 U/L 24 - 29  ALT 0 - 44 U/L 47(H) - 62(H)      RADIOGRAPHIC STUDIES: I have personally reviewed the radiological images as listed and agreed with the findings in the report.  07/15/2018 Liver US IMPRESSION: No liver lesions identified with ultrasound. Ultrasound-guided biopsy was not performed. Recommend further characterization for liver lesions with a repeat MRI, with and without contrast.  ASSESSMENT & PLAN:  Ernest Wu is a 58 y.o. male with history of  1. Adenocarcinoma of the pancreas, cT3N1Mx, borderline resectable,possible liver metastasis   2.  Mild thrombocytopenia -Due to chemotherapy, will continue monitoring  closely -A CBC returned today with a platelet count higher at 147,000  Plan  -Labs reviewed with Dr. Burr Medico. She approves proceeding with chemo cycle 3 FOLFIRINOX tomorrow -f/u in 2 weeks     Harle Stanford, PA-C 08/07/2018   This case was discussed with Dr. Burr Medico. She expressed agreement with my management of this patient.

## 2018-08-12 ENCOUNTER — Other Ambulatory Visit: Payer: Self-pay | Admitting: Hematology

## 2018-08-13 ENCOUNTER — Telehealth: Payer: Self-pay | Admitting: Hematology

## 2018-08-13 NOTE — Telephone Encounter (Signed)
R/s appt per 1/8 sch message - pt is aware that tomorrows appt has been cancelled. And scheduled for 1/15

## 2018-08-14 ENCOUNTER — Ambulatory Visit: Payer: Managed Care, Other (non HMO) | Admitting: Hematology

## 2018-08-14 ENCOUNTER — Other Ambulatory Visit: Payer: Managed Care, Other (non HMO)

## 2018-08-14 ENCOUNTER — Ambulatory Visit: Payer: Managed Care, Other (non HMO)

## 2018-08-16 ENCOUNTER — Inpatient Hospital Stay: Payer: Managed Care, Other (non HMO)

## 2018-08-18 NOTE — Progress Notes (Signed)
[place]   [phone] [phone]   Clinic Follow up Note   Patient Care Team: Street, [name], MD as PCP - General (Family Medicine)  Date of Service:  08/20/2018  CHIEF COMPLAINT: F/u of pancreatic cancer  SUMMARY OF ONCOLOGIC HISTORY: Oncology History   Cancer Staging Adenocarcinoma of pancreas [facility]) Staging form: Exocrine Pancreas, AJCC 8th Edition - Clinical stage from 06/10/2018: Stage IB (cT2, cN0, cM0) - Signed by [name], MD on 06/29/2018       Adenocarcinoma of pancreas ([place])   06/07/2018 Imaging    CT AP w Contrast IMPRESSION: 1. There is a low attenuation mass centered around the neck of pancreas which is concerning for neoplasm. Pancreatic adenocarcinoma favored. This results in common bile duct obstruction with mild intrahepatic biliary ductal dilatation. There also is involvement of the portal venous confluence. Further evaluation with nonemergent contrast enhanced MRI of the pancreas is recommended. 2. Small indeterminate low-attenuation structure is noted within segment 4 a of the liver. This could be better addressed at MRI.    06/09/2018 Imaging    MR ABD MRCP  The pancreatic mass involves approximately 40 percent of the main portal vein circumference at the portal splenic venous confluence, with associated mild narrowing of the portal splenic venous confluence. The SMV, splenic vein and main, right and left portal veins remain patent. The celiac trunk and SMA are not involved by the pancreatic mass.  IMPRESSION: 2.3 cm diameter hypoenhancing mass at the junction of the head and neck of pancreas with pancreatic ductal dilatation, likely adenocarcinoma.  Intra and extrahepatic bile duct dilatation with abrupt change in caliber at the mid common bile duct. This could indicate an obstructing mass or stricture.     06/10/2018 Initial Biopsy    Diagnosis PANCREAS, FINE NEEDLE ASPIRATION (SPECIMEN 1 OF 1 COLLECTED  06/10/18): MALIGNANT CELLS CONSISTENT WITH ADENOCARCINOMA.    06/10/2018 Procedure    IMPRESSION: 1. 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 2. Placement of a metallic biliary stent.    06/10/2018 Procedure    EUS per Dr. Paulita Fujita Impression:  - There was dilation in the common bile duct which measured up to 12 mm. - A mass was identified in the pancreatic head. This was staged T3 N1 Mx by endosonographic criteria. Fine needle aspiration performed. - A few lymph nodes were visualized and measured in the peripancreatic region. - There was no evidence of significant pathology in the left lobe of the liver.    06/10/2018 Cancer Staging    Staging form: Exocrine Pancreas, AJCC 8th Edition - Clinical stage from 06/10/2018: Stage IB (cT2, cN0, cM0) - Signed by [name], MD on 06/29/2018    06/27/2018 Initial Diagnosis    Adenocarcinoma of pancreas ([place])    07/02/2018 Imaging    IMPRESSION: 1. Multiple small pulmonary nodules scattered throughout the lungs measuring up to 7 mm in size. These are nonspecific, but the possibility of metastatic disease should be considered, and close attention at time of routine followups is recommended. 2. In addition, today's study demonstrates new and enlarging low-attenuation lesions in the liver. This is poorly evaluated on today's noncontrast CT examination, but is concerning for potential metastatic disease. Further evaluation with repeat nonemergent MRI of the abdomen with and without IV gadolinium is suggested in the near future to better evaluate these findings. 3. Aortic atherosclerosis, in addition to left main and 2 vessel coronary artery disease. Please  note that although the presence of coronary artery calcium documents the presence of coronary artery disease, the severity of this disease and any potential stenosis cannot be assessed on this non-gated CT examination. Assessment for potential  risk factor modification, dietary therapy or pharmacologic therapy may be warranted, if clinically indicated. 4. Mild aneurysmal dilatation of the ascending thoracic aorta (4.8 cm in diameter). Ascending thoracic aortic aneurysm. Recommend semi-annual imaging followup by CTA or MRA and referral to cardiothoracic surgery if not already obtained. This recommendation follows 2010 ACCF/AHA/AATS/ACR/ASA/SCA/SCAI/SIR/STS/SVM Guidelines for the Diagnosis and Management of Patients With Thoracic Aortic Disease. Circulation. 2010; 121: C144-Y185.  Aortic Atherosclerosis (ICD10-I70.0). Aortic aneurysm NOS (ICD10-I71.9).    07/04/2018 -  Chemotherapy     FOLFIRINOX q2 weeks    07/15/2018 Imaging    07/15/2018 Liver US IMPRESSION: No liver lesions identified with ultrasound. Ultrasound-guided biopsy was not performed. Recommend further characterization for liver lesions with a repeat MRI, with and without contrast.    07/24/2018 Imaging    07/24/2018 MRI Abdomen IMPRESSION: 1. The hypoenhancing mass at the junction of the pancreatic body and head has reduced in size, previously 3.2 by 2.8 cm and currently 2.9 by 2.2 cm. A small peripancreatic lymph node adjacent to the mass was previously 0.9 cm in short axis and is currently 0.7 cm in short axis. The amount of contact between the pancreatic mass and the confluence of the splenic vein and SMV is similar to the prior exam. 2. There is a 6 mm probable hemangioma in segment 4a of the liver, based on the delayed enhancement pattern. This is somewhat ill-defined. The lesion appeared larger on the prior noncontrast CT but presumably may have been overestimated on that exam. There is also some hypodensity along the dome of the right hepatic lobe which appears to most likely be due to a slip of the diaphragm rather than a discernible lesion on MRI. 3. Aortic Atherosclerosis (ICD10-I70.0). Notably, there is a small amount of mural thrombus along  the right side of the abdominal aorta below the right renal artery level which has not been seen previously.      CURRENT THERAPY:  Neoadjuvant FOLFIRINOX q2 weeks, started on 07/05/2018  INTERVAL HISTORY:  ISHMEL ACEVEDO is here for a follow up. He presents to the clinic today with his wife. He notes he is doing well. He has tolerated treatment moderately well with constipation. He has Miralax and colase. He has been drinking plenty of water. He also feel fatigued for 2-3 days after chemo before recovering. He does continue to work.  His wife notes today's treatment was suppose to be last week but was postponed to today.     REVIEW OF SYSTEMS:   Constitutional: Denies fevers, chills or abnormal weight loss (+) fatigue  Eyes: Denies blurriness of vision Ears, nose, mouth, throat, and face: Denies mucositis or sore throat Respiratory: Denies cough, dyspnea or wheezes Cardiovascular: Denies palpitation, chest discomfort or lower extremity swelling Gastrointestinal:  Denies nausea, heartburn (+) Constipation with occasional cramping Skin: Denies abnormal skin rashes Lymphatics: Denies new lymphadenopathy or easy bruising Neurological:Denies numbness, tingling or new weaknesses Behavioral/Psych: Mood is stable, no new changes  All other systems were reviewed with the patient and are negative.  MEDICAL HISTORY:  Past Medical History:  Diagnosis Date  . Cancer ([place]) 06/2018   Pancreatic  . GERD (gastroesophageal reflux disease)     SURGICAL HISTORY: Past Surgical History:  Procedure Laterality Date  . BILIARY STENT PLACEMENT  06/10/2018  Procedure: BILIARY STENT PLACEMENT;  Surgeon: Ronnette Juniper, MD;  [facility];  Service: Gastroenterology;;  . ERCP N/A 06/10/2018   Procedure: ENDOSCOPIC RETROGRADE CHOLANGIOPANCREATOGRAPHY (ERCP);  Surgeon: Ronnette Juniper, MD;  Location: [place];  Service: Gastroenterology;  Laterality: N/A;  . ESOPHAGOGASTRODUODENOSCOPY (EGD) WITH  PROPOFOL N/A 06/10/2018   Procedure: ESOPHAGOGASTRODUODENOSCOPY (EGD) WITH PROPOFOL;  Surgeon: Ronnette Juniper, MD;  Location: [place];  Service: Gastroenterology;  Laterality: N/A;  . FINE NEEDLE ASPIRATION  06/10/2018   Procedure: FINE NEEDLE ASPIRATION (FNA) LINEAR;  Surgeon: Ronnette Juniper, MD;  Location: [facility] ENDOSCOPY;  Service: Gastroenterology;;  . Sol Passer PLACEMENT N/A 07/02/2018   Procedure: INSERTION PORT-A-CATH;  Surgeon: Stark Klein, MD;  Location: [place];  Service: General;  Laterality: N/A;  . SPHINCTEROTOMY  06/10/2018   Procedure: SPHINCTEROTOMY;  Surgeon: Ronnette Juniper, MD;  Location: [facility] ENDOSCOPY;  Service: Gastroenterology;;  . UPPER ESOPHAGEAL ENDOSCOPIC ULTRASOUND (EUS) N/A 06/10/2018   Procedure: UPPER ESOPHAGEAL ENDOSCOPIC ULTRASOUND (EUS);  Surgeon: Ronnette Juniper, MD;  Location: [place];  Service: Gastroenterology;  Laterality: N/A;  . VASECTOMY      I have reviewed the social history and family history with the patient and they are unchanged from previous note.  ALLERGIES:  is allergic to contrast media [iodinated diagnostic agents] and iohexol.  MEDICATIONS:  Current Outpatient Medications  Medication Sig Dispense Refill  . ALPRAZolam (XANAX) 0.25 MG tablet Take 1 tablet (0.25 mg total) by mouth at bedtime as needed for anxiety. 30 tablet 0  . diphenoxylate-atropine (LOMOTIL) 2.5-0.025 MG tablet Take 2 tablets by mouth 4 (four) times daily as needed for diarrhea or loose stools. 40 tablet 0  . esomeprazole (NEXIUM) 20 MG capsule Take 20 mg by mouth daily.     Marland Kitchen ibuprofen (ADVIL,MOTRIN) 200 MG tablet Take 400 mg by mouth daily as needed for headache or moderate pain.     Marland Kitchen lidocaine-prilocaine (EMLA) cream Apply to affected area once 30 g 3  . ondansetron (ZOFRAN) 8 MG tablet Take 1 tablet (8 mg total) by mouth 2 (two) times daily as needed for refractory nausea / vomiting. Start on day 3 after chemotherapy. 30 tablet 1  . oxyCODONE (OXY IR/ROXICODONE) 5 MG immediate  release tablet Take 1 tablet (5 mg total) by mouth every 6 (six) hours as needed for severe pain. 20 tablet 0  . prochlorperazine (COMPAZINE) 10 MG tablet Take 1 tablet (10 mg total) by mouth every 6 (six) hours as needed (NAUSEA). 30 tablet 1  . simethicone (MYLICON) 160 MG chewable tablet Chew 125 mg by mouth every 6 (six) hours as needed for flatulence.     No current facility-administered medications for this visit.    Facility-Administered Medications Ordered in Other Visits  Medication Dose Route Frequency Provider Last Rate Last Dose  . heparin lock flush 100 unit/mL  500 Units Intracatheter Once PRN [name], MD      . sodium chloride flush (NS) 0.9 % injection 10 mL  10 mL Intracatheter PRN [name], MD        PHYSICAL EXAMINATION: ECOG PERFORMANCE STATUS: 1 - Symptomatic but completely ambulatory  Vitals:   08/20/18 0936  BP: 131/78  Pulse: 72  Resp: 18  Temp: 98.6 F (37 C)  SpO2: 98%   Filed Weights   08/20/18 0936  Weight: 199 lb (90.3 kg)    GENERAL:alert, no distress and comfortable SKIN: skin color, texture, turgor are normal, no rashes or significant lesions EYES: normal, Conjunctiva are pink and non-injected, sclera clear OROPHARYNX:no  exudate, no erythema and lips, buccal mucosa, and tongue normal  NECK: supple, thyroid normal size, non-tender, without nodularity LYMPH:  no palpable lymphadenopathy in the cervical, axillary or inguinal LUNGS: clear to auscultation and percussion with normal breathing effort HEART: regular rate & rhythm and no murmurs and no lower extremity edema ABDOMEN:abdomen soft, non-tender and normal bowel sounds Musculoskeletal:no cyanosis of digits and no clubbing  NEURO: alert & oriented x 3 with fluent speech, no focal motor/sensory deficits  LABORATORY DATA:  I have reviewed the data as listed CBC Latest Ref Rng & Units 08/20/2018 08/01/2018 07/18/2018  WBC 4.0 - 10.5 K/uL 5.6 7.2 3.8(L)  Hemoglobin 13.0 - 17.0 g/dL 13.5 13.6  13.9  Hematocrit 39.0 - 52.0 % 40.9 41.6 42.0  Platelets 150 - 400 K/uL 178 147(L) 97(L)     CMP Latest Ref Rng & Units 08/20/2018 08/01/2018 07/18/2018  Glucose 70 - 99 mg/dL 167(H) 142(H) 104(H)  BUN 6 - 20 mg/dL 9 8 12   Creatinine 0.61 - 1.24 mg/dL 0.79 0.83 0.92  Sodium 135 - 145 mmol/L 141 143 137  Potassium 3.5 - 5.1 mmol/L 3.8 3.9 3.7  Chloride 98 - 111 mmol/L 106 108 104  CO2 22 - 32 mmol/L 28 26 27   Calcium 8.9 - 10.3 mg/dL 9.3 9.0 8.6(L)  Total Protein 6.5 - 8.1 g/dL 6.6 6.2(L) -  Total Bilirubin 0.3 - 1.2 mg/dL 0.4 0.4 -  Alkaline Phos 38 - 126 U/L 130(H) 138(H) -  AST 15 - 41 U/L 25 24 -  ALT 0 - 44 U/L 52(H) 47(H) -      RADIOGRAPHIC STUDIES: I have personally reviewed the radiological images as listed and agreed with the findings in the report. No results found.   ASSESSMENT & PLAN:  Ernest Wu is a 58 y.o. male with   1. Adenocarcinoma of the pancreas,cT3N1M0, borderline resectable  -He presented with obstructive jaundice, status post MRCP and stent placement.  Diagnosed in 06/2018.  -He was seen by surgeon Dr. Barry Dienes, his pancreatic tumor was borderline resectable, neoadjuvant chemotherapy was recommended.   -initial staging CT/MRI showed a 9 mm indeterminate liver lesion, repeat MRI showed probable hemangioma -He has started [facility]. q2 weeks on 07/05/18. Tolerates moderately well with cold sensitivity, fatigue, constipation and cramping.  -MRI Liver from 07/25/19 shows probable hemangioma in liver. -Will proceed with MRI of pancrease before next visit to monitor  response to treatment.  -Labs reviewed, CBC and CMP WNL except glucose at 167 and mildly elevated LFTs. Overall adequate to proceed with FOLFIRINOX today  -F/u in 2 weeks with repeated CT scan for restaging    2.Alcohol and smoking cessation  -He had quit alcohol and is currently on folic acid. -I previously encouraged him to quit smoking. He is willing.   3.  Mild  thrombocytopenia -Due to chemotherapy, will continue monitoring closely -Currently resolved, PLT at 178K today (08/20/2018)  4. Constipation  -Secondary to chemo and premeds  -I encouraged him to use Miralax and Colase.  -I also encouraged him to continue to drink plenty of water.   Plan  -Labs reviewed and adequate to proceed with cycle 4 FOLFIRINOX today  -Lab, flush, f/u and chemo FOLFIRINOX in 2, 4 and 6 weeks  -restaging MRI with contrast in 1-2 weeks, he is allergic to CT IV contrast     No problem-specific Assessment & Plan notes found for this encounter.   Orders Placed This Encounter  Procedures  . MR Abdomen W Contrast  Pancreatic protocol,evaluate response to neoadjuvant chemo    Standing Status:   Future    Standing Expiration Date:   08/20/2019    Order Specific Question:   If indicated for the ordered procedure, I authorize the administration of contrast media per Radiology protocol    Answer:   Yes    Order Specific Question:   What is the patient's sedation requirement?    Answer:   Anti-anxiety    Order Specific Question:   Does the patient have a pacemaker or implanted devices?    Answer:   No    Order Specific Question:   Radiology Contrast Protocol - do NOT remove file path    Answer:   [email or web address]    Order Specific Question:   Preferred imaging location?    Answer:   [facility] (table limit-350 lbs)   All questions were answered. The patient knows to call the clinic with any problems, questions or concerns. No barriers to learning was detected. I spent 20 minutes counseling the patient face to face. The total time spent in the appointment was 25 minutes and more than 50% was on counseling and review of test results     [name], MD 08/20/2018   I, Joslyn Devon, am acting as scribe for [name], MD.   I have reviewed the above documentation for accuracy and completeness, and I agree with the above.

## 2018-08-20 ENCOUNTER — Inpatient Hospital Stay: Payer: Managed Care, Other (non HMO) | Attending: Hematology

## 2018-08-20 ENCOUNTER — Encounter: Payer: Self-pay | Admitting: Hematology

## 2018-08-20 ENCOUNTER — Inpatient Hospital Stay: Payer: Managed Care, Other (non HMO)

## 2018-08-20 ENCOUNTER — Telehealth: Payer: Self-pay | Admitting: Hematology

## 2018-08-20 ENCOUNTER — Inpatient Hospital Stay (HOSPITAL_BASED_OUTPATIENT_CLINIC_OR_DEPARTMENT_OTHER): Payer: Managed Care, Other (non HMO) | Admitting: Hematology

## 2018-08-20 VITALS — BP 131/78 | HR 72 | Temp 98.6°F | Resp 18 | Ht 72.0 in | Wt 199.0 lb

## 2018-08-20 VITALS — BP 123/82 | HR 86 | Temp 98.0°F | Resp 16

## 2018-08-20 DIAGNOSIS — Z791 Long term (current) use of non-steroidal anti-inflammatories (NSAID): Secondary | ICD-10-CM | POA: Insufficient documentation

## 2018-08-20 DIAGNOSIS — T451X5S Adverse effect of antineoplastic and immunosuppressive drugs, sequela: Secondary | ICD-10-CM

## 2018-08-20 DIAGNOSIS — Z5112 Encounter for antineoplastic immunotherapy: Secondary | ICD-10-CM | POA: Diagnosis not present

## 2018-08-20 DIAGNOSIS — Z95828 Presence of other vascular implants and grafts: Secondary | ICD-10-CM

## 2018-08-20 DIAGNOSIS — C259 Malignant neoplasm of pancreas, unspecified: Secondary | ICD-10-CM

## 2018-08-20 DIAGNOSIS — I700 Atherosclerosis of aorta: Secondary | ICD-10-CM

## 2018-08-20 DIAGNOSIS — R070 Pain in throat: Secondary | ICD-10-CM | POA: Diagnosis not present

## 2018-08-20 DIAGNOSIS — R918 Other nonspecific abnormal finding of lung field: Secondary | ICD-10-CM

## 2018-08-20 DIAGNOSIS — F17210 Nicotine dependence, cigarettes, uncomplicated: Secondary | ICD-10-CM | POA: Insufficient documentation

## 2018-08-20 DIAGNOSIS — K5903 Drug induced constipation: Secondary | ICD-10-CM

## 2018-08-20 DIAGNOSIS — K219 Gastro-esophageal reflux disease without esophagitis: Secondary | ICD-10-CM

## 2018-08-20 DIAGNOSIS — Z79899 Other long term (current) drug therapy: Secondary | ICD-10-CM

## 2018-08-20 DIAGNOSIS — C257 Malignant neoplasm of other parts of pancreas: Secondary | ICD-10-CM | POA: Insufficient documentation

## 2018-08-20 DIAGNOSIS — I2510 Atherosclerotic heart disease of native coronary artery without angina pectoris: Secondary | ICD-10-CM | POA: Insufficient documentation

## 2018-08-20 DIAGNOSIS — D6959 Other secondary thrombocytopenia: Secondary | ICD-10-CM | POA: Insufficient documentation

## 2018-08-20 LAB — CMP (CANCER CENTER ONLY)
ALBUMIN: 3.7 g/dL (ref 3.5–5.0)
ALT: 52 U/L — ABNORMAL HIGH (ref 0–44)
AST: 25 U/L (ref 15–41)
Alkaline Phosphatase: 130 U/L — ABNORMAL HIGH (ref 38–126)
Anion gap: 7 (ref 5–15)
BILIRUBIN TOTAL: 0.4 mg/dL (ref 0.3–1.2)
BUN: 9 mg/dL (ref 6–20)
CO2: 28 mmol/L (ref 22–32)
Calcium: 9.3 mg/dL (ref 8.9–10.3)
Chloride: 106 mmol/L (ref 98–111)
Creatinine: 0.79 mg/dL (ref 0.61–1.24)
GFR, Est AFR Am: >60 mL/min (ref >60)
GFR, Estimated: >60 mL/min (ref >60)
Glucose, Bld: 167 mg/dL — ABNORMAL HIGH (ref 70–99)
Potassium: 3.8 mmol/L (ref 3.5–5.1)
Sodium: 141 mmol/L (ref 135–145)
Total Protein: 6.6 g/dL (ref 6.5–8.1)

## 2018-08-20 LAB — CBC WITH DIFFERENTIAL (CANCER CENTER ONLY)
Abs Immature Granulocytes: 0.02 10*3/uL (ref 0.00–0.07)
Basophils Absolute: 0 10*3/uL (ref 0.0–0.1)
Basophils Relative: 1 %
Eosinophils Absolute: 0.1 10*3/uL (ref 0.0–0.5)
Eosinophils Relative: 1 %
HCT: 40.9 % (ref 39.0–52.0)
Hemoglobin: 13.5 g/dL (ref 13.0–17.0)
Immature Granulocytes: 0 %
Lymphocytes Relative: 21 %
Lymphs Abs: 1.2 10*3/uL (ref 0.7–4.0)
MCH: 29.9 pg (ref 26.0–34.0)
MCHC: 33 g/dL (ref 30.0–36.0)
MCV: 90.5 fL (ref 80.0–100.0)
Monocytes Absolute: 0.4 10*3/uL (ref 0.1–1.0)
Monocytes Relative: 8 %
NRBC: 0 % (ref 0.0–0.2)
Neutro Abs: 3.9 10*3/uL (ref 1.7–7.7)
Neutrophils Relative %: 69 %
Platelet Count: 178 10*3/uL (ref 150–400)
RBC: 4.52 MIL/uL (ref 4.22–5.81)
RDW: 14.4 % (ref 11.5–15.5)
WBC Count: 5.6 10*3/uL (ref 4.0–10.5)

## 2018-08-20 MED ORDER — IRINOTECAN HCL CHEMO INJECTION 100 MG/5ML
150.0000 mg/m2 | Freq: Once | INTRAVENOUS | Status: AC
  Administered 2018-08-20: 320 mg via INTRAVENOUS
  Filled 2018-08-20: qty 15

## 2018-08-20 MED ORDER — PALONOSETRON HCL INJECTION 0.25 MG/5ML
INTRAVENOUS | Status: AC
  Filled 2018-08-20: qty 5

## 2018-08-20 MED ORDER — SODIUM CHLORIDE 0.9 % IV SOLN
2375.0000 mg/m2 | INTRAVENOUS | Status: DC
  Administered 2018-08-20: 5000 mg via INTRAVENOUS
  Filled 2018-08-20: qty 100

## 2018-08-20 MED ORDER — LEUCOVORIN CALCIUM INJECTION 350 MG
400.0000 mg/m2 | Freq: Once | INTRAVENOUS | Status: AC
  Administered 2018-08-20: 840 mg via INTRAVENOUS
  Filled 2018-08-20: qty 42

## 2018-08-20 MED ORDER — DEXTROSE 5 % IV SOLN
Freq: Once | INTRAVENOUS | Status: AC
  Filled 2018-08-20: qty 250

## 2018-08-20 MED ORDER — DEXAMETHASONE SODIUM PHOSPHATE 10 MG/ML IJ SOLN
INTRAMUSCULAR | Status: AC
  Filled 2018-08-20: qty 1

## 2018-08-20 MED ORDER — DEXAMETHASONE SODIUM PHOSPHATE 10 MG/ML IJ SOLN
10.0000 mg | Freq: Once | INTRAMUSCULAR | Status: AC
  Administered 2018-08-20: 10 mg via INTRAVENOUS

## 2018-08-20 MED ORDER — DEXTROSE 5 % IV SOLN
Freq: Once | INTRAVENOUS | Status: AC
  Administered 2018-08-20: 11:00:00 via INTRAVENOUS
  Filled 2018-08-20: qty 250

## 2018-08-20 MED ORDER — OXALIPLATIN CHEMO INJECTION 100 MG/20ML
85.0000 mg/m2 | Freq: Once | INTRAVENOUS | Status: AC
  Administered 2018-08-20: 180 mg via INTRAVENOUS
  Filled 2018-08-20: qty 36

## 2018-08-20 MED ORDER — PALONOSETRON HCL INJECTION 0.25 MG/5ML
0.2500 mg | Freq: Once | INTRAVENOUS | Status: AC
  Administered 2018-08-20: 0.25 mg via INTRAVENOUS

## 2018-08-20 MED ORDER — SODIUM CHLORIDE 0.9% FLUSH
10.0000 mL | Freq: Once | INTRAVENOUS | Status: AC
  Administered 2018-08-20: 10 mL
  Filled 2018-08-20: qty 10

## 2018-08-20 MED ORDER — ATROPINE SULFATE 1 MG/ML IJ SOLN
0.5000 mg | Freq: Once | INTRAMUSCULAR | Status: AC | PRN
  Administered 2018-08-20: 0.5 mg via INTRAVENOUS

## 2018-08-20 MED ORDER — ATROPINE SULFATE 1 MG/ML IJ SOLN
INTRAMUSCULAR | Status: AC
  Filled 2018-08-20: qty 1

## 2018-08-20 NOTE — Progress Notes (Signed)
Patient's spouse called and left voicemail regarding documents she needed to provide for assistance.  Went to infusion area to obtain documents and made copies.  Applied for copay assistance through [employer]. Patient approved for $4500 08/20/18 - 08/21/19 in copay assistance with a $0 copay after insurance pays for treatment drugs. Copy given to [facility] for billing/copay monitoring.Patient will receive a packet in the mail for his records only.  Enrolled patient in [place] [employer] copay assistance for [employer]. Patient approved  for maximum amount of up to $15,000 over the next 12 months 08/20/18 leaving the patient with a $0 copay after insurance pays. The earliest acceptable claim date is 05/22/18. Paperwork given to [facility] for billing/copay monitoring. A copy will be mailed to the patient as well for his records.  He has my card for any additional financial questions or concerns.

## 2018-08-20 NOTE — Progress Notes (Signed)
Obtained required signature on physician diagnosis form.  [facility]. Fax received ok per confirmation sheet.

## 2018-08-20 NOTE — Patient Instructions (Signed)
[facility] Discharge Instructions for Patients Receiving Chemotherapy  Today you received the following chemotherapy agents :  Oxaliplatin,  Irinotecan, Leucovorin, Fluorouracil.  To help prevent nausea and vomiting after your treatment, we encourage you to take your nausea medication as prescribed.   If you develop nausea and vomiting that is not controlled by your nausea medication, call the clinic.   BELOW ARE SYMPTOMS THAT SHOULD BE REPORTED IMMEDIATELY:  *FEVER GREATER THAN 100.5 F  *CHILLS WITH OR WITHOUT FEVER  NAUSEA AND VOMITING THAT IS NOT CONTROLLED WITH YOUR NAUSEA MEDICATION  *UNUSUAL SHORTNESS OF BREATH  *UNUSUAL BRUISING OR BLEEDING  TENDERNESS IN MOUTH AND THROAT WITH OR WITHOUT PRESENCE OF ULCERS  *URINARY PROBLEMS  *BOWEL PROBLEMS  UNUSUAL RASH Items with * indicate a potential emergency and should be followed up as soon as possible.  Feel free to call the clinic should you have any questions or concerns. The clinic phone number is [phone].  Please show the CHEMO ALERT CARD at check-in to the Emergency Department and triage nurse.   

## 2018-08-20 NOTE — Telephone Encounter (Signed)
Scheduled MRI appt.  Printed calendar and avs

## 2018-08-21 LAB — CANCER ANTIGEN 19-9: CA 19-9: 24 U/mL (ref 0–35)

## 2018-08-22 ENCOUNTER — Inpatient Hospital Stay: Payer: Managed Care, Other (non HMO)

## 2018-08-22 ENCOUNTER — Inpatient Hospital Stay (HOSPITAL_BASED_OUTPATIENT_CLINIC_OR_DEPARTMENT_OTHER): Payer: Managed Care, Other (non HMO) | Admitting: Medical

## 2018-08-22 ENCOUNTER — Other Ambulatory Visit: Payer: Self-pay | Admitting: Medical

## 2018-08-22 VITALS — BP 128/78 | HR 84 | Temp 98.2°F | Resp 16

## 2018-08-22 DIAGNOSIS — C259 Malignant neoplasm of pancreas, unspecified: Secondary | ICD-10-CM

## 2018-08-22 DIAGNOSIS — K219 Gastro-esophageal reflux disease without esophagitis: Secondary | ICD-10-CM

## 2018-08-22 DIAGNOSIS — Z79899 Other long term (current) drug therapy: Secondary | ICD-10-CM

## 2018-08-22 DIAGNOSIS — R070 Pain in throat: Secondary | ICD-10-CM

## 2018-08-22 DIAGNOSIS — F17210 Nicotine dependence, cigarettes, uncomplicated: Secondary | ICD-10-CM

## 2018-08-22 DIAGNOSIS — I2510 Atherosclerotic heart disease of native coronary artery without angina pectoris: Secondary | ICD-10-CM | POA: Diagnosis not present

## 2018-08-22 DIAGNOSIS — I700 Atherosclerosis of aorta: Secondary | ICD-10-CM

## 2018-08-22 DIAGNOSIS — R918 Other nonspecific abnormal finding of lung field: Secondary | ICD-10-CM

## 2018-08-22 DIAGNOSIS — C257 Malignant neoplasm of other parts of pancreas: Secondary | ICD-10-CM

## 2018-08-22 DIAGNOSIS — Z791 Long term (current) use of non-steroidal anti-inflammatories (NSAID): Secondary | ICD-10-CM

## 2018-08-22 MED ORDER — PEGFILGRASTIM-CBQV 6 MG/0.6ML ~~LOC~~ SOSY
6.0000 mg | PREFILLED_SYRINGE | Freq: Once | SUBCUTANEOUS | Status: AC
  Administered 2018-08-22: 6 mg via SUBCUTANEOUS

## 2018-08-22 MED ORDER — PEGFILGRASTIM-CBQV 6 MG/0.6ML ~~LOC~~ SOSY
PREFILLED_SYRINGE | SUBCUTANEOUS | Status: AC
  Filled 2018-08-22: qty 0.6

## 2018-08-22 MED ORDER — SODIUM CHLORIDE 0.9% FLUSH
10.0000 mL | INTRAVENOUS | Status: DC | PRN
  Administered 2018-08-22: 10 mL
  Filled 2018-08-22: qty 10

## 2018-08-22 MED ORDER — MAGIC MOUTHWASH W/LIDOCAINE: 5.0000 mL | Freq: Four times a day (QID) | ORAL | 2 refills | Status: AC | PRN

## 2018-08-22 MED ORDER — HEPARIN SOD (PORK) LOCK FLUSH 100 UNIT/ML IV SOLN
500.0000 [IU] | Freq: Once | INTRAVENOUS | Status: AC | PRN
  Administered 2018-08-22: 500 [IU]
  Filled 2018-08-22: qty 5

## 2018-08-22 NOTE — Progress Notes (Signed)
[facility] Progress Note   Ernest Wu [id number] [birth year] 58 y.o.  [name] is managed by Dr. [name]  Actively treated with chemotherapy/immunotherapy/hormonal therapy: yes  Current Therapy: FOLFIRINOX  Last Treated: 01 / 15 / 2020 (Cycle 4)  Assessment: Plan:    Adenocarcinoma of pancreas ([place])  Throat pain   Adenocarcinoma of the pancreas: Ernest Wu is status post cycle 4 of FOLFIRINOX which was dosed on 08/20/2018. He will return for labs on 09/04/2018.  Throat pain: Ernest Wu was given a prescription for Magic Mouthwash.  Please see After Visit Summary for patient specific instructions.  Future Appointments  Date Time Provider [place]  09/01/2018  8:00 AM [facility] 1 [facility] [place]  09/04/2018  8:00 AM [facility] LAB 4 [facility] None  09/04/2018  8:15 AM [facility] [place] FLUSH [facility] None  09/04/2018  9:00 AM [facility] INFUSION [facility] None  09/04/2018  9:00 AM [name], MD [facility] None  09/06/2018 12:30 PM [facility] [place] FLUSH [facility] None  09/18/2018  7:45 AM [facility] LAB 4 [facility] None  09/18/2018  8:00 AM [facility] [place] None  09/18/2018  8:15 AM [name], MD [facility] None  09/18/2018  9:00 AM [facility] INFUSION [facility] None  09/20/2018 12:00 PM [facility] [place] FLUSH [facility] None  10/02/2018  7:45 AM [facility] LAB 3 [facility] None  10/02/2018  8:00 AM [facility] [place] FLUSH [facility] None  10/02/2018  8:15 AM [name], MD [facility] None  10/02/2018  9:00 AM [facility] INFUSION [facility] None  10/04/2018 12:00 PM [facility] [place] FLUSH [facility] None    No orders of the defined types were placed in this encounter.      Subjective:   Patient ID:  Ernest Wu is a 58 y.o. (DOB Jul 23, 1961) male.  Chief Complaint: No chief complaint on file.   HPI Ernest Wu  Is a 58 year old male with an adenocarcinoma of the pancreas. He is managed by Dr. Burr Medico and is status post cycle 4 of  FOLFIRINOX which was dosed on 08/20/2018. He was seen today in the flush room while he was having his pump discontinued. He reports having soreness of his throat which extends to his mid-sternum. He has been evaluated for chest pain previously with an EKG from 07/07/2018 showing:  Normal sinus rhythm Nonspecific ST abnormality Since last tracing of earlier today Nonspecific ST abnormality   Medications: I have reviewed the patient's current medications.  Allergies:  Allergies  Allergen Reactions  . Contrast Media [Iodinated Diagnostic Agents] Other (See Comments)    Burning sensation [SEE CONTRAST MEDIA]  . Iohexol Anaphylaxis, Itching and Rash    Past Medical History:  Diagnosis Date  . Cancer ([place]) 06/2018   Pancreatic  . GERD (gastroesophageal reflux disease)     Past Surgical History:  Procedure Laterality Date  . BILIARY STENT PLACEMENT  06/10/2018   Procedure: BILIARY STENT PLACEMENT;  Surgeon: Ronnette Juniper, MD;  Location: [facility] ENDOSCOPY;  Service: Gastroenterology;;  . ERCP N/A 06/10/2018   Procedure: ENDOSCOPIC RETROGRADE CHOLANGIOPANCREATOGRAPHY (ERCP);  Surgeon: Ronnette Juniper, MD;  Location: [place];  Service: Gastroenterology;  Laterality: N/A;  . ESOPHAGOGASTRODUODENOSCOPY (EGD) WITH PROPOFOL N/A 06/10/2018   Procedure: ESOPHAGOGASTRODUODENOSCOPY (EGD) WITH PROPOFOL;  Surgeon: Ronnette Juniper, MD;  Location: [place];  Service: Gastroenterology;  Laterality: N/A;  . FINE NEEDLE ASPIRATION  06/10/2018   Procedure: FINE NEEDLE ASPIRATION (FNA) LINEAR;  Surgeon: Ronnette Juniper, MD;  Location: [facility] ENDOSCOPY;  Service: Gastroenterology;;  . Sol Passer PLACEMENT N/A 07/02/2018  Procedure: INSERTION PORT-A-CATH;  Surgeon: Stark Klein, MD;  Location: [place];  Service: General;  Laterality: N/A;  . SPHINCTEROTOMY  06/10/2018   Procedure: Joan Mayans;  Surgeon: Ronnette Juniper, MD;  Location: [facility] ENDOSCOPY;  Service: Gastroenterology;;  . UPPER ESOPHAGEAL ENDOSCOPIC ULTRASOUND (EUS)  N/A 06/10/2018   Procedure: UPPER ESOPHAGEAL ENDOSCOPIC ULTRASOUND (EUS);  Surgeon: Ronnette Juniper, MD;  Location: [place];  Service: Gastroenterology;  Laterality: N/A;  . VASECTOMY      No family history on file.  Social History   Socioeconomic History  . Marital status: Married    Spouse name: Not on file  . Number of children: 5  . Years of education: Not on file  . Highest education level: Not on file  Occupational History  . Not on file  Social Needs  . Financial resource strain: Not on file  . Food insecurity:    Worry: Not on file    Inability: Not on file  . Transportation needs:    Medical: Not on file    Non-medical: Not on file  Tobacco Use  . Smoking status: Current Every Day Smoker    Packs/day: 0.25    Years: 30.00    Pack years: 7.50  . Smokeless tobacco: Never Used  . Tobacco comment: cut back considerably  Substance and Sexual Activity  . Alcohol use: Yes    Alcohol/week: 21.0 standard drinks    Types: 21 Cans of beer per week  . Drug use: Never  . Sexual activity: Not on file  Lifestyle  . Physical activity:    Days per week: Not on file    Minutes per session: Not on file  . Stress: Not on file  Relationships  . Social connections:    Talks on phone: Not on file    Gets together: Not on file    Attends religious service: Not on file    Active member of club or organization: Not on file    Attends meetings of clubs or organizations: Not on file    Relationship status: Not on file  . Intimate partner violence:    Fear of current or ex partner: Not on file    Emotionally abused: Not on file    Physically abused: Not on file    Forced sexual activity: Not on file  Other Topics Concern  . Not on file  Social History Narrative  . Not on file    Past Medical History, Surgical history, Social history, and Family history were reviewed and updated as appropriate.   Please see review of systems for further details on the patient's review from  today.   Review of Systems:  Review of Systems  Constitutional: Negative for activity change, appetite change, chills, diaphoresis, fatigue, fever and unexpected weight change.  HENT: Positive for sore throat. Negative for trouble swallowing and voice change.   Respiratory: Negative for cough, choking, chest tightness, shortness of breath and wheezing.   Cardiovascular: Positive for chest pain. Negative for palpitations.  Gastrointestinal: Negative for abdominal pain, constipation, diarrhea, nausea and vomiting.  Musculoskeletal: Negative for back pain and myalgias.  Neurological: Negative for dizziness, light-headedness and headaches.    Objective:   Physical Exam:  There were no vitals taken for this visit. ECOG: 0  Physical Exam Constitutional:      General: He is not in acute distress.    Appearance: He is not diaphoretic.  HENT:     Head: Normocephalic and atraumatic.  Cardiovascular:     Rate  and Rhythm: Normal rate and regular rhythm.     Heart sounds: Normal heart sounds. No murmur. No friction rub. No gallop.   Pulmonary:     Effort: Pulmonary effort is normal. No respiratory distress.     Breath sounds: Normal breath sounds. No wheezing or rales.  Skin:    General: Skin is warm and dry.     Findings: No erythema or rash.  Neurological:     Mental Status: He is alert.     Lab Review:     Component Value Date/Time   NA 141 08/20/2018 0852   K 3.8 08/20/2018 0852   CL 106 08/20/2018 0852   CO2 28 08/20/2018 0852   GLUCOSE 167 (H) 08/20/2018 0852   BUN 9 08/20/2018 0852   CREATININE 0.79 08/20/2018 0852   CALCIUM 9.3 08/20/2018 0852   PROT 6.6 08/20/2018 0852   ALBUMIN 3.7 08/20/2018 0852   AST 25 08/20/2018 0852   ALT 52 (H) 08/20/2018 0852   ALKPHOS 130 (H) 08/20/2018 0852   BILITOT 0.4 08/20/2018 0852   GFRNONAA >60 08/20/2018 0852   GFRAA >60 08/20/2018 0852       Component Value Date/Time   WBC 5.6 08/20/2018 0852   WBC 3.8 (L) 07/18/2018 2106    RBC 4.52 08/20/2018 0852   HGB 13.5 08/20/2018 0852   HCT 40.9 08/20/2018 0852   PLT 178 08/20/2018 0852   MCV 90.5 08/20/2018 0852   MCH 29.9 08/20/2018 0852   MCHC 33.0 08/20/2018 0852   RDW 14.4 08/20/2018 0852   LYMPHSABS 1.2 08/20/2018 0852   MONOABS 0.4 08/20/2018 0852   EOSABS 0.1 08/20/2018 0852   BASOSABS 0.0 08/20/2018 0852   -------------------------------  Imaging from last 24 hours (if applicable):  Radiology interpretation: Mr Abdomen W Wo Contrast  Result Date: 07/24/2018 CLINICAL DATA:  Pancreatic adenocarcinoma follow up. EXAM: MRI ABDOMEN WITHOUT AND WITH CONTRAST TECHNIQUE: Multiplanar multisequence MR imaging of the abdomen was performed both before and after the administration of intravenous contrast. CONTRAST:  8 cc Gadavist COMPARISON:  Multiple exams, including 07/02/2018 CT scan, and MRI of 06/09/2018 FINDINGS: Despite efforts by the technologist and patient, motion artifact is present on today's exam and could not be eliminated. This reduces exam sensitivity and specificity. Lower chest: The small pulmonary nodules revealed on recent chest CT are not readily seen on today's MRI, although this may well be due to motion artifact and the small size the nodules. Hepatobiliary: A 0.6 cm in diameter T1 hypointense lesion in the left hepatic lobe on image 29/1200 appears to probably diffusely enhance on postcontrast images and includes delayed enhancement favoring a small hemangioma. Overall the lesion is smaller and less conspicuous than suggested on the prior CT. There is also a questionable lesion along the dome of the right hepatic lobe the recent chest CT which is probably due to a slip of the diaphragm rather than an actual lesion. I do not appreciate a definite hepatic lesion in that location. Suspected gas in the gallbladder and in the biliary tree. The gallbladder is of small caliber and demonstrates wall thickening. A biliary stent traverses the pancreatic head.  No appreciable biliary dilatation. Pancreas: Dorsal pancreatic duct dilatation. The hypoenhancing mass at the junction of the pancreatic body and head measures 2.9 by 2.2 cm on image 53/1202 (previously 3.2 by 2.8 cm by my measurements on the MRI from 06/09/2018), and abuts and mildly deforms the anterior margin of the confluence of the splenic and superior mesenteric veins.  There is some mass effect on the superior mesenteric vein, similar to the 06/09/2018 exam. Mild dilatation of the dorsal pancreatic duct distal to the mass. Spleen:  Unremarkable Adrenals/Urinary Tract:  Unremarkable Stomach/Bowel: Unremarkable Vascular/Lymphatic: A peripancreatic lymph node measures 0.7 cm in short axis on image 48/1202, previously 0.9 cm. Aortoiliac atherosclerotic vascular disease. There is a small amount of mural thrombus in the right side of the abdominal aorta below the level of the right renal artery which has not been appreciable on prior exams including the MRI of 06/09/2018. Other:  No supplemental non-categorized findings. Musculoskeletal: Unremarkable IMPRESSION: 1. The hypoenhancing mass at the junction of the pancreatic body and head has reduced in size, previously 3.2 by 2.8 cm and currently 2.9 by 2.2 cm. A small peripancreatic lymph node adjacent to the mass was previously 0.9 cm in short axis and is currently 0.7 cm in short axis. The amount of contact between the pancreatic mass and the confluence of the splenic vein and SMV is similar to the prior exam. 2. There is a 6 mm probable hemangioma in segment 4a of the liver, based on the delayed enhancement pattern. This is somewhat ill-defined. The lesion appeared larger on the prior noncontrast CT but presumably may have been overestimated on that exam. There is also some hypodensity along the dome of the right hepatic lobe which appears to most likely be due to a slip of the diaphragm rather than a discernible lesion on MRI. 3. Aortic Atherosclerosis  (ICD10-I70.0). Notably, there is a small amount of mural thrombus along the right side of the abdominal aorta below the right renal artery level which has not been seen previously. Electronically Signed   By: Marysa Wessner Clines M.D.   On: 07/24/2018 11:08   Mr 3d Recon At Scanner  Result Date: 07/24/2018 CLINICAL DATA:  Pancreatic adenocarcinoma follow up. EXAM: MRI ABDOMEN WITHOUT AND WITH CONTRAST TECHNIQUE: Multiplanar multisequence MR imaging of the abdomen was performed both before and after the administration of intravenous contrast. CONTRAST:  8 cc Gadavist COMPARISON:  Multiple exams, including 07/02/2018 CT scan, and MRI of 06/09/2018 FINDINGS: Despite efforts by the technologist and patient, motion artifact is present on today's exam and could not be eliminated. This reduces exam sensitivity and specificity. Lower chest: The small pulmonary nodules revealed on recent chest CT are not readily seen on today's MRI, although this may well be due to motion artifact and the small size the nodules. Hepatobiliary: A 0.6 cm in diameter T1 hypointense lesion in the left hepatic lobe on image 29/1200 appears to probably diffusely enhance on postcontrast images and includes delayed enhancement favoring a small hemangioma. Overall the lesion is smaller and less conspicuous than suggested on the prior CT. There is also a questionable lesion along the dome of the right hepatic lobe the recent chest CT which is probably due to a slip of the diaphragm rather than an actual lesion. I do not appreciate a definite hepatic lesion in that location. Suspected gas in the gallbladder and in the biliary tree. The gallbladder is of small caliber and demonstrates wall thickening. A biliary stent traverses the pancreatic head. No appreciable biliary dilatation. Pancreas: Dorsal pancreatic duct dilatation. The hypoenhancing mass at the junction of the pancreatic body and head measures 2.9 by 2.2 cm on image 53/1202 (previously 3.2  by 2.8 cm by my measurements on the MRI from 06/09/2018), and abuts and mildly deforms the anterior margin of the confluence of the splenic and superior mesenteric veins. There  is some mass effect on the superior mesenteric vein, similar to the 06/09/2018 exam. Mild dilatation of the dorsal pancreatic duct distal to the mass. Spleen:  Unremarkable Adrenals/Urinary Tract:  Unremarkable Stomach/Bowel: Unremarkable Vascular/Lymphatic: A peripancreatic lymph node measures 0.7 cm in short axis on image 48/1202, previously 0.9 cm. Aortoiliac atherosclerotic vascular disease. There is a small amount of mural thrombus in the right side of the abdominal aorta below the level of the right renal artery which has not been appreciable on prior exams including the MRI of 06/09/2018. Other:  No supplemental non-categorized findings. Musculoskeletal: Unremarkable IMPRESSION: 1. The hypoenhancing mass at the junction of the pancreatic body and head has reduced in size, previously 3.2 by 2.8 cm and currently 2.9 by 2.2 cm. A small peripancreatic lymph node adjacent to the mass was previously 0.9 cm in short axis and is currently 0.7 cm in short axis. The amount of contact between the pancreatic mass and the confluence of the splenic vein and SMV is similar to the prior exam. 2. There is a 6 mm probable hemangioma in segment 4a of the liver, based on the delayed enhancement pattern. This is somewhat ill-defined. The lesion appeared larger on the prior noncontrast CT but presumably may have been overestimated on that exam. There is also some hypodensity along the dome of the right hepatic lobe which appears to most likely be due to a slip of the diaphragm rather than a discernible lesion on MRI. 3. Aortic Atherosclerosis (ICD10-I70.0). Notably, there is a small amount of mural thrombus along the right side of the abdominal aorta below the right renal artery level which has not been seen previously. Electronically Signed   By: Jehad Bisono Clines M.D.   On: 07/24/2018 11:08

## 2018-08-22 NOTE — Patient Instructions (Signed)
Pegfilgrastim injection  What is this medicine?  PEGFILGRASTIM (PEG fil gra stim) is a long-acting granulocyte colony-stimulating factor that stimulates the growth of neutrophils, a type of white blood cell important in the body's fight against infection. It is used to reduce the incidence of fever and infection in patients with certain types of cancer who are receiving chemotherapy that affects the bone marrow, and to increase survival after being exposed to high doses of radiation.  This medicine may be used for other purposes; ask your health care provider or pharmacist if you have questions.  COMMON BRAND NAME(S): Fulphila, Neulasta, UDENYCA  What should I tell my health care provider before I take this medicine?  They need to know if you have any of these conditions:  -kidney disease  -latex allergy  -ongoing radiation therapy  -sickle cell disease  -skin reactions to acrylic adhesives (On-Body Injector only)  -an unusual or allergic reaction to pegfilgrastim, filgrastim, other medicines, foods, dyes, or preservatives  -pregnant or trying to get pregnant  -breast-feeding  How should I use this medicine?  This medicine is for injection under the skin. If you get this medicine at home, you will be taught how to prepare and give the pre-filled syringe or how to use the On-body Injector. Refer to the patient Instructions for Use for detailed instructions. Use exactly as directed. Tell your healthcare provider immediately if you suspect that the On-body Injector may not have performed as intended or if you suspect the use of the On-body Injector resulted in a missed or partial dose.  It is important that you put your used needles and syringes in a special sharps container. Do not put them in a trash can. If you do not have a sharps container, call your pharmacist or healthcare provider to get one.  Talk to your pediatrician regarding the use of this medicine in children. While this drug may be prescribed for  selected conditions, precautions do apply.  Overdosage: If you think you have taken too much of this medicine contact a poison control center or emergency room at once.  NOTE: This medicine is only for you. Do not share this medicine with others.  What if I miss a dose?  It is important not to miss your dose. Call your doctor or health care professional if you miss your dose. If you miss a dose due to an On-body Injector failure or leakage, a new dose should be administered as soon as possible using a single prefilled syringe for manual use.  What may interact with this medicine?  Interactions have not been studied.  Give your health care provider a list of all the medicines, herbs, non-prescription drugs, or dietary supplements you use. Also tell them if you smoke, drink alcohol, or use illegal drugs. Some items may interact with your medicine.  This list may not describe all possible interactions. Give your health care provider a list of all the medicines, herbs, non-prescription drugs, or dietary supplements you use. Also tell them if you smoke, drink alcohol, or use illegal drugs. Some items may interact with your medicine.  What should I watch for while using this medicine?  You may need blood work done while you are taking this medicine.  If you are going to need a MRI, CT scan, or other procedure, tell your doctor that you are using this medicine (On-Body Injector only).  What side effects may I notice from receiving this medicine?  Side effects that you should report to   your doctor or health care professional as soon as possible:  -allergic reactions like skin rash, itching or hives, swelling of the face, lips, or tongue  -back pain  -dizziness  -fever  -pain, redness, or irritation at site where injected  -pinpoint red spots on the skin  -red or dark-brown urine  -shortness of breath or breathing problems  -stomach or side pain, or pain at the shoulder  -swelling  -tiredness  -trouble passing urine or  change in the amount of urine  Side effects that usually do not require medical attention (report to your doctor or health care professional if they continue or are bothersome):  -bone pain  -muscle pain  This list may not describe all possible side effects. Call your doctor for medical advice about side effects. You may report side effects to FDA at 1-800-FDA-1088.  Where should I keep my medicine?  Keep out of the reach of children.  If you are using this medicine at home, you will be instructed on how to store it. Throw away any unused medicine after the expiration date on the label.  NOTE: This sheet is a summary. It may not cover all possible information. If you have questions about this medicine, talk to your doctor, pharmacist, or health care provider.   2019 Elsevier/Gold Standard (2017-10-28 16:57:08)

## 2018-08-27 ENCOUNTER — Telehealth: Payer: Self-pay

## 2018-08-27 NOTE — Telephone Encounter (Signed)
Left message for Ernest Wu that her intermittent leave paperwork was faxed to the number that was provided.  Asked her to let me know is she would like the original mailed to her or place of employment. Requested she call back and let me know.

## 2018-08-28 ENCOUNTER — Ambulatory Visit: Payer: Managed Care, Other (non HMO) | Admitting: Hematology

## 2018-08-28 ENCOUNTER — Other Ambulatory Visit: Payer: Managed Care, Other (non HMO)

## 2018-08-28 ENCOUNTER — Ambulatory Visit: Payer: Managed Care, Other (non HMO)

## 2018-09-01 ENCOUNTER — Ambulatory Visit (HOSPITAL_COMMUNITY): Admission: RE | Admit: 2018-09-01 | Payer: Managed Care, Other (non HMO) | Source: Ambulatory Visit

## 2018-09-02 ENCOUNTER — Other Ambulatory Visit: Payer: Self-pay | Admitting: Hematology

## 2018-09-02 ENCOUNTER — Other Ambulatory Visit: Payer: Self-pay | Admitting: *Deleted

## 2018-09-02 DIAGNOSIS — C259 Malignant neoplasm of pancreas, unspecified: Secondary | ICD-10-CM

## 2018-09-02 NOTE — Progress Notes (Signed)
[place]   Telephone:([phone]) [phone] Fax:([phone]) [phone]   Clinic Follow up Note   Patient Care Team: Street, [name], MD as PCP - General (Family Medicine) 09/04/2018  CHIEF COMPLAINT: F/u on pancreatic cancer   SUMMARY OF ONCOLOGIC HISTORY: Oncology History   Cancer Staging Adenocarcinoma of pancreas [facility]) Staging form: Exocrine Pancreas, AJCC 8th Edition - Clinical stage from 06/10/2018: Stage IB (cT2, cN0, cM0) - Signed by [name], MD on 06/29/2018       Adenocarcinoma of pancreas ([place])   06/07/2018 Imaging    CT AP w Contrast IMPRESSION: 1. There is a low attenuation mass centered around the neck of pancreas which is concerning for neoplasm. Pancreatic adenocarcinoma favored. This results in common bile duct obstruction with mild intrahepatic biliary ductal dilatation. There also is involvement of the portal venous confluence. Further evaluation with nonemergent contrast enhanced MRI of the pancreas is recommended. 2. Small indeterminate low-attenuation structure is noted within segment 4 a of the liver. This could be better addressed at MRI.    06/09/2018 Imaging    MR ABD MRCP  The pancreatic mass involves approximately 40 percent of the main portal vein circumference at the portal splenic venous confluence, with associated mild narrowing of the portal splenic venous confluence. The SMV, splenic vein and main, right and left portal veins remain patent. The celiac trunk and SMA are not involved by the pancreatic mass.  IMPRESSION: 2.3 cm diameter hypoenhancing mass at the junction of the head and neck of pancreas with pancreatic ductal dilatation, likely adenocarcinoma.  Intra and extrahepatic bile duct dilatation with abrupt change in caliber at the mid common bile duct. This could indicate an obstructing mass or stricture.     06/10/2018 Initial Biopsy    Diagnosis PANCREAS, FINE NEEDLE ASPIRATION (SPECIMEN 1 OF 1 COLLECTED 06/10/18): MALIGNANT  CELLS CONSISTENT WITH ADENOCARCINOMA.    06/10/2018 Procedure    IMPRESSION: 1. 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 2. Placement of a metallic biliary stent.    06/10/2018 Procedure    EUS per Dr. Paulita Fujita Impression:  - There was dilation in the common bile duct which measured up to 12 mm. - A mass was identified in the pancreatic head. This was staged T3 N1 Mx by endosonographic criteria. Fine needle aspiration performed. - A few lymph nodes were visualized and measured in the peripancreatic region. - There was no evidence of significant pathology in the left lobe of the liver.    06/10/2018 Cancer Staging    Staging form: Exocrine Pancreas, AJCC 8th Edition - Clinical stage from 06/10/2018: Stage IB (cT2, cN0, cM0) - Signed by [name], MD on 06/29/2018    06/27/2018 Initial Diagnosis    Adenocarcinoma of pancreas ([place])    07/02/2018 Imaging    IMPRESSION: 1. Multiple small pulmonary nodules scattered throughout the lungs measuring up to 7 mm in size. These are nonspecific, but the possibility of metastatic disease should be considered, and close attention at time of routine followups is recommended. 2. In addition, today's study demonstrates new and enlarging low-attenuation lesions in the liver. This is poorly evaluated on today's noncontrast CT examination, but is concerning for potential metastatic disease. Further evaluation with repeat nonemergent MRI of the abdomen with and without IV gadolinium is suggested in the near future to better evaluate these findings. 3. Aortic atherosclerosis, in addition to left main and 2 vessel coronary artery disease. Please note that although the  presence of coronary artery calcium documents the presence of coronary artery disease, the severity of this disease and any potential stenosis cannot be assessed on this non-gated CT examination. Assessment for potential risk factor  modification, dietary therapy or pharmacologic therapy may be warranted, if clinically indicated. 4. Mild aneurysmal dilatation of the ascending thoracic aorta (4.8 cm in diameter). Ascending thoracic aortic aneurysm. Recommend semi-annual imaging followup by CTA or MRA and referral to cardiothoracic surgery if not already obtained. This recommendation follows 2010 ACCF/AHA/AATS/ACR/ASA/SCA/SCAI/SIR/STS/SVM Guidelines for the Diagnosis and Management of Patients With Thoracic Aortic Disease. Circulation. 2010; 121: A128-N867.  Aortic Atherosclerosis (ICD10-I70.0). Aortic aneurysm NOS (ICD10-I71.9).    07/04/2018 -  Chemotherapy     FOLFIRINOX q2 weeks    07/15/2018 Imaging    07/15/2018 Liver US IMPRESSION: No liver lesions identified with ultrasound. Ultrasound-guided biopsy was not performed. Recommend further characterization for liver lesions with a repeat MRI, with and without contrast.    07/24/2018 Imaging    07/24/2018 MRI Abdomen IMPRESSION: 1. The hypoenhancing mass at the junction of the pancreatic body and head has reduced in size, previously 3.2 by 2.8 cm and currently 2.9 by 2.2 cm. A small peripancreatic lymph node adjacent to the mass was previously 0.9 cm in short axis and is currently 0.7 cm in short axis. The amount of contact between the pancreatic mass and the confluence of the splenic vein and SMV is similar to the prior exam. 2. There is a 6 mm probable hemangioma in segment 4a of the liver, based on the delayed enhancement pattern. This is somewhat ill-defined. The lesion appeared larger on the prior noncontrast CT but presumably may have been overestimated on that exam. There is also some hypodensity along the dome of the right hepatic lobe which appears to most likely be due to a slip of the diaphragm rather than a discernible lesion on MRI. 3. Aortic Atherosclerosis (ICD10-I70.0). Notably, there is a small amount of mural thrombus along the right  side of the abdominal aorta below the right renal artery level which has not been seen previously.    09/03/2018 Imaging    09/03/2018 MRI Abdomen IMPRESSION: 1. Stable mass (adenocarcinoma) in the neck of the pancreas with upstream duct dilatation. 2. No evidence of lymphadenopathy in the porta hepatis or peripancreatic fat. 3. No evidence hepatic metastasis. 4. Mild biliary duct dilatation LEFT hepatic lobe similar to comparison exam. Biliary stent within the common bile duct.     CURRENT THERAPY Neoadjuvant FOLFIRINOX q2 weeks, started on 07/05/2018   INTERVAL HISTORY: Ernest Wu is a 58 y.o. male who is here for follow-up. He recently saw PA Tanner for soar throat and he was prescribed Magic Mouthwash. He also had an abdominal MRI. Today, he is here with his wife. He is doing well and tolerated last cycle with constipation. His constipation was relieved with medications. He denies nausea or appetite changes.    Pertinent positives and negatives of review of systems are listed and detailed within the above HPI.  REVIEW OF SYSTEMS:   Constitutional: Denies fevers, chills or abnormal weight loss Eyes: Denies blurriness of vision Ears, nose, mouth, throat, and face: Denies mucositis or sore throat Respiratory: Denies cough, dyspnea or wheezes Cardiovascular: Denies palpitation, chest discomfort or lower extremity swelling Gastrointestinal:  Denies nausea, heartburn (+) constipation  Skin: Denies abnormal skin rashes Lymphatics: Denies new lymphadenopathy or easy bruising Neurological:Denies numbness, tingling or new weaknesses Behavioral/Psych: Mood is stable, no new changes  All other systems were  reviewed with the patient and are negative.  MEDICAL HISTORY:  Past Medical History:  Diagnosis Date  . Cancer ([place]) 06/2018   Pancreatic  . GERD (gastroesophageal reflux disease)     SURGICAL HISTORY: Past Surgical History:  Procedure Laterality Date  . BILIARY STENT  PLACEMENT  06/10/2018   Procedure: BILIARY STENT PLACEMENT;  Surgeon: Ronnette Juniper, MD;  Location: [facility] ENDOSCOPY;  Service: Gastroenterology;;  . ERCP N/A 06/10/2018   Procedure: ENDOSCOPIC RETROGRADE CHOLANGIOPANCREATOGRAPHY (ERCP);  Surgeon: Ronnette Juniper, MD;  Location: [place];  Service: Gastroenterology;  Laterality: N/A;  . ESOPHAGOGASTRODUODENOSCOPY (EGD) WITH PROPOFOL N/A 06/10/2018   Procedure: ESOPHAGOGASTRODUODENOSCOPY (EGD) WITH PROPOFOL;  Surgeon: Ronnette Juniper, MD;  Location: [place];  Service: Gastroenterology;  Laterality: N/A;  . FINE NEEDLE ASPIRATION  06/10/2018   Procedure: FINE NEEDLE ASPIRATION (FNA) LINEAR;  Surgeon: Ronnette Juniper, MD;  Location: [facility] ENDOSCOPY;  Service: Gastroenterology;;  . Sol Passer PLACEMENT N/A 07/02/2018   Procedure: INSERTION PORT-A-CATH;  Surgeon: Stark Klein, MD;  Location: [place];  Service: General;  Laterality: N/A;  . SPHINCTEROTOMY  06/10/2018   Procedure: SPHINCTEROTOMY;  Surgeon: Ronnette Juniper, MD;  Location: [facility] ENDOSCOPY;  Service: Gastroenterology;;  . UPPER ESOPHAGEAL ENDOSCOPIC ULTRASOUND (EUS) N/A 06/10/2018   Procedure: UPPER ESOPHAGEAL ENDOSCOPIC ULTRASOUND (EUS);  Surgeon: Ronnette Juniper, MD;  Location: [place];  Service: Gastroenterology;  Laterality: N/A;  . VASECTOMY      I have reviewed the social history and family history with the patient and they are unchanged from previous note.  ALLERGIES:  is allergic to contrast media [iodinated diagnostic agents] and iohexol.  MEDICATIONS:  Current Outpatient Medications  Medication Sig Dispense Refill  . ALPRAZolam (XANAX) 0.25 MG tablet Take 1 tablet (0.25 mg total) by mouth at bedtime as needed for anxiety. 30 tablet 0  . diphenoxylate-atropine (LOMOTIL) 2.5-0.025 MG tablet Take 2 tablets by mouth 4 (four) times daily as needed for diarrhea or loose stools. 40 tablet 0  . esomeprazole (NEXIUM) 20 MG capsule Take 20 mg by mouth daily.     Marland Kitchen ibuprofen (ADVIL,MOTRIN) 200 MG tablet Take  400 mg by mouth daily as needed for headache or moderate pain.     Marland Kitchen lidocaine-prilocaine (EMLA) cream Apply to affected area once 30 g 3  . magic mouthwash w/lidocaine SOLN Take 5 mLs by mouth 4 (four) times daily as needed for mouth pain. 240 mL 2  . ondansetron (ZOFRAN) 8 MG tablet Take 1 tablet (8 mg total) by mouth 2 (two) times daily as needed for refractory nausea / vomiting. Start on day 3 after chemotherapy. 30 tablet 1  . oxyCODONE (OXY IR/ROXICODONE) 5 MG immediate release tablet Take 1 tablet (5 mg total) by mouth every 6 (six) hours as needed for severe pain. 20 tablet 0  . prochlorperazine (COMPAZINE) 10 MG tablet Take 1 tablet (10 mg total) by mouth every 6 (six) hours as needed (NAUSEA). 30 tablet 1  . simethicone (MYLICON) 161 MG chewable tablet Chew 125 mg by mouth every 6 (six) hours as needed for flatulence.     No current facility-administered medications for this visit.    Facility-Administered Medications Ordered in Other Visits  Medication Dose Route Frequency Provider Last Rate Last Dose  . heparin lock flush 100 unit/mL  500 Units Intracatheter Once PRN [name], MD      . sodium chloride flush (NS) 0.9 % injection 10 mL  10 mL Intracatheter PRN [name], MD        PHYSICAL EXAMINATION: ECOG  PERFORMANCE STATUS: 1 - Symptomatic but completely ambulatory  Vitals: BP 136/88 Pulse 66 RR 18 BMI 26.71 weight 197Ibs  GENERAL:alert, no distress and comfortable SKIN: skin color, texture, turgor are normal, no rashes or significant lesions EYES: normal, Conjunctiva are pink and non-injected, sclera clear OROPHARYNX:no exudate, no erythema and lips, buccal mucosa, and tongue normal  NECK: supple, thyroid normal size, non-tender, without nodularity LYMPH:  no palpable lymphadenopathy in the cervical, axillary or inguinal LUNGS: clear to auscultation and percussion with normal breathing effort HEART: regular rate & rhythm and no murmurs and no lower extremity  edema ABDOMEN:abdomen soft, non-tender and normal bowel sounds Musculoskeletal:no cyanosis of digits and no clubbing  NEURO: alert & oriented x 3 with fluent speech, no focal motor/sensory deficits  LABORATORY DATA:  I have reviewed the data as listed CBC Latest Ref Rng & Units 09/04/2018 08/20/2018 08/01/2018  WBC 4.0 - 10.5 K/uL 4.4 5.6 7.2  Hemoglobin 13.0 - 17.0 g/dL 13.7 13.5 13.6  Hematocrit 39.0 - 52.0 % 41.0 40.9 41.6  Platelets 150 - 400 K/uL 141(L) 178 147(L)     CMP Latest Ref Rng & Units 09/04/2018 08/20/2018 08/01/2018  Glucose 70 - 99 mg/dL 152(H) 167(H) 142(H)  BUN 6 - 20 mg/dL 10 9 8   Creatinine 0.61 - 1.24 mg/dL 0.83 0.79 0.83  Sodium 135 - 145 mmol/L 141 141 143  Potassium 3.5 - 5.1 mmol/L 3.9 3.8 3.9  Chloride 98 - 111 mmol/L 105 106 108  CO2 22 - 32 mmol/L 27 28 26   Calcium 8.9 - 10.3 mg/dL 9.2 9.3 9.0  Total Protein 6.5 - 8.1 g/dL 6.7 6.6 6.2(L)  Total Bilirubin 0.3 - 1.2 mg/dL 0.4 0.4 0.4  Alkaline Phos 38 - 126 U/L 125 130(H) 138(H)  AST 15 - 41 U/L 23 25 24   ALT 0 - 44 U/L 36 52(H) 47(H)      RADIOGRAPHIC STUDIES: I have personally reviewed the radiological images as listed and agreed with the findings in the report.  09/03/2018 MRI Abdomen IMPRESSION: 1. Stable mass (adenocarcinoma) in the neck of the pancreas with upstream duct dilatation. 2. No evidence of lymphadenopathy in the porta hepatis or peripancreatic fat. 3. No evidence hepatic metastasis. 4. Mild biliary duct dilatation LEFT hepatic lobe similar to comparison exam. Biliary stent within the common bile duct.  ASSESSMENT & PLAN:  Ernest Wu is a 58 y.o. male with history of  1. Adenocarcinoma of the pancreas,cT3N1M0, borderline resectable  -Diagnosed in 06/2018. Treated with neoadjuvant chemo. Currently on FOLRIRNOX q2 weeks, started in 06/2018. Tolerating well overall, with mild cold sensitivity, fatigue, and constipation. -I reviewed and discussed his restaging abdominal MRI,  which showed stable diseae. I plan to discuss in GI conference next week. Will continue neoadjuvant chemo, plan for a total of 4 months. -Labs reviewed, CBC showed PLT 141K otherwise normal. CMP showed BG 152. CA 19.9 pending. Will continue with chemo today -He knows to call for any concerns -f/u in 2 weeks with labs before chemo    2.Alcohol and smokingcessation -He previously quit drinking alcohol, and is currently on folic acid. -I previously advised him to completely quit smoking, he has cut back   3.Mild thrombocytopenia -Secondary to chemo. -Labs reviewed, CBC showed PLT 141K.  4. Constipation  -Secondary to chemo and premedications. -I previously encouraged him to take Miralax and Colase as needed and drink plenty of fluids. -He experienced constipation with last cycle. Was relieved with OTC laxatives.   Plan  -restaging MRI  reviewed, SD -labs reviewed, good to proceed with chemo today and continue every 2 weeks  -f/u in 2 weeks with labs before chemo   No problem-specific Assessment & Plan notes found for this encounter.   No orders of the defined types were placed in this encounter.  All questions were answered. The patient knows to call the clinic with any problems, questions or concerns. No barriers to learning was detected. I spent 20 minutes counseling the patient face to face. The total time spent in the appointment was 25 minutes and more than 50% was on counseling and review of test results  I, Noor Dweik am acting as scribe for Dr. [name].  I have reviewed the above documentation for accuracy and completeness, and I agree with the above.     [name], MD 09/04/2018

## 2018-09-03 ENCOUNTER — Ambulatory Visit (HOSPITAL_COMMUNITY)
Admission: RE | Admit: 2018-09-03 | Discharge: 2018-09-03 | Disposition: A | Discharge status: Home / Self Care | Payer: Managed Care, Other (non HMO) | Source: Ambulatory Visit | Attending: Hematology | Admitting: Hematology

## 2018-09-03 ENCOUNTER — Other Ambulatory Visit: Payer: Self-pay | Admitting: Hematology

## 2018-09-03 DIAGNOSIS — C259 Malignant neoplasm of pancreas, unspecified: Secondary | ICD-10-CM

## 2018-09-03 MED ORDER — GADOBUTROL 1 MMOL/ML IV SOLN
9.0000 mL | Freq: Once | INTRAVENOUS | Status: AC | PRN
  Administered 2018-09-03: 9 mL via INTRAVENOUS

## 2018-09-04 ENCOUNTER — Inpatient Hospital Stay: Payer: Managed Care, Other (non HMO)

## 2018-09-04 ENCOUNTER — Telehealth: Payer: Self-pay | Admitting: Hematology

## 2018-09-04 ENCOUNTER — Encounter: Payer: Self-pay | Admitting: Hematology

## 2018-09-04 ENCOUNTER — Inpatient Hospital Stay (HOSPITAL_BASED_OUTPATIENT_CLINIC_OR_DEPARTMENT_OTHER): Payer: Managed Care, Other (non HMO) | Admitting: Hematology

## 2018-09-04 VITALS — BP 136/88 | HR 66 | Temp 98.2°F | Resp 18 | Ht 72.0 in | Wt 197.0 lb

## 2018-09-04 DIAGNOSIS — C259 Malignant neoplasm of pancreas, unspecified: Secondary | ICD-10-CM

## 2018-09-04 DIAGNOSIS — Z95828 Presence of other vascular implants and grafts: Secondary | ICD-10-CM

## 2018-09-04 DIAGNOSIS — D6959 Other secondary thrombocytopenia: Secondary | ICD-10-CM | POA: Diagnosis not present

## 2018-09-04 DIAGNOSIS — I700 Atherosclerosis of aorta: Secondary | ICD-10-CM

## 2018-09-04 DIAGNOSIS — C257 Malignant neoplasm of other parts of pancreas: Secondary | ICD-10-CM

## 2018-09-04 DIAGNOSIS — F17210 Nicotine dependence, cigarettes, uncomplicated: Secondary | ICD-10-CM

## 2018-09-04 DIAGNOSIS — I2510 Atherosclerotic heart disease of native coronary artery without angina pectoris: Secondary | ICD-10-CM

## 2018-09-04 DIAGNOSIS — K219 Gastro-esophageal reflux disease without esophagitis: Secondary | ICD-10-CM

## 2018-09-04 DIAGNOSIS — K903 Pancreatic steatorrhea: Secondary | ICD-10-CM | POA: Diagnosis not present

## 2018-09-04 DIAGNOSIS — R918 Other nonspecific abnormal finding of lung field: Secondary | ICD-10-CM | POA: Diagnosis not present

## 2018-09-04 DIAGNOSIS — R070 Pain in throat: Secondary | ICD-10-CM

## 2018-09-04 DIAGNOSIS — T451X5S Adverse effect of antineoplastic and immunosuppressive drugs, sequela: Secondary | ICD-10-CM

## 2018-09-04 DIAGNOSIS — Z79899 Other long term (current) drug therapy: Secondary | ICD-10-CM

## 2018-09-04 DIAGNOSIS — Z791 Long term (current) use of non-steroidal anti-inflammatories (NSAID): Secondary | ICD-10-CM

## 2018-09-04 LAB — CMP (CANCER CENTER ONLY)
ALBUMIN: 3.9 g/dL (ref 3.5–5.0)
ALT: 36 U/L (ref 0–44)
AST: 23 U/L (ref 15–41)
Alkaline Phosphatase: 125 U/L (ref 38–126)
Anion gap: 9 (ref 5–15)
BILIRUBIN TOTAL: 0.4 mg/dL (ref 0.3–1.2)
BUN: 10 mg/dL (ref 6–20)
CO2: 27 mmol/L (ref 22–32)
CREATININE: 0.83 mg/dL (ref 0.61–1.24)
Calcium: 9.2 mg/dL (ref 8.9–10.3)
Chloride: 105 mmol/L (ref 98–111)
GFR, Est AFR Am: >60 mL/min (ref >60)
GFR, Estimated: >60 mL/min (ref >60)
Glucose, Bld: 152 mg/dL — ABNORMAL HIGH (ref 70–99)
Potassium: 3.9 mmol/L (ref 3.5–5.1)
Sodium: 141 mmol/L (ref 135–145)
Total Protein: 6.7 g/dL (ref 6.5–8.1)

## 2018-09-04 LAB — CBC WITH DIFFERENTIAL (CANCER CENTER ONLY)
Abs Immature Granulocytes: 0.02 10*3/uL (ref 0.00–0.07)
Basophils Absolute: 0 10*3/uL (ref 0.0–0.1)
Basophils Relative: 1 %
Eosinophils Absolute: 0.1 10*3/uL (ref 0.0–0.5)
Eosinophils Relative: 2 %
HCT: 41 % (ref 39.0–52.0)
Hemoglobin: 13.7 g/dL (ref 13.0–17.0)
Immature Granulocytes: 1 %
Lymphocytes Relative: 22 %
Lymphs Abs: 1 10*3/uL (ref 0.7–4.0)
MCH: 30.3 pg (ref 26.0–34.0)
MCHC: 33.4 g/dL (ref 30.0–36.0)
MCV: 90.7 fL (ref 80.0–100.0)
Monocytes Absolute: 0.4 10*3/uL (ref 0.1–1.0)
Monocytes Relative: 8 %
Neutro Abs: 2.9 10*3/uL (ref 1.7–7.7)
Neutrophils Relative %: 66 %
Platelet Count: 141 10*3/uL — ABNORMAL LOW (ref 150–400)
RBC: 4.52 MIL/uL (ref 4.22–5.81)
RDW: 15.3 % (ref 11.5–15.5)
WBC Count: 4.4 10*3/uL (ref 4.0–10.5)
nRBC: 0 % (ref 0.0–0.2)

## 2018-09-04 MED ORDER — OXALIPLATIN CHEMO INJECTION 100 MG/20ML
85.0000 mg/m2 | Freq: Once | INTRAVENOUS | Status: AC
  Administered 2018-09-04: 180 mg via INTRAVENOUS
  Filled 2018-09-04: qty 36

## 2018-09-04 MED ORDER — DEXAMETHASONE SODIUM PHOSPHATE 10 MG/ML IJ SOLN
10.0000 mg | Freq: Once | INTRAMUSCULAR | Status: AC
  Administered 2018-09-04: 10 mg via INTRAVENOUS

## 2018-09-04 MED ORDER — SODIUM CHLORIDE 0.9 % IV SOLN
2390.0000 mg/m2 | INTRAVENOUS | Status: DC
  Administered 2018-09-04: 5000 mg via INTRAVENOUS
  Filled 2018-09-04: qty 100

## 2018-09-04 MED ORDER — ATROPINE SULFATE 1 MG/ML IJ SOLN
0.5000 mg | Freq: Once | INTRAMUSCULAR | Status: AC | PRN
  Administered 2018-09-04: 0.5 mg via INTRAVENOUS

## 2018-09-04 MED ORDER — DEXAMETHASONE SODIUM PHOSPHATE 10 MG/ML IJ SOLN
INTRAMUSCULAR | Status: AC
  Filled 2018-09-04: qty 1

## 2018-09-04 MED ORDER — DEXTROSE 5 % IV SOLN
Freq: Once | INTRAVENOUS | Status: AC
  Administered 2018-09-04: 09:00:00 via INTRAVENOUS
  Filled 2018-09-04: qty 250

## 2018-09-04 MED ORDER — IRINOTECAN HCL CHEMO INJECTION 100 MG/5ML
150.0000 mg/m2 | Freq: Once | INTRAVENOUS | Status: AC
  Administered 2018-09-04: 320 mg via INTRAVENOUS
  Filled 2018-09-04: qty 15

## 2018-09-04 MED ORDER — LEUCOVORIN CALCIUM INJECTION 350 MG
400.0000 mg/m2 | Freq: Once | INTRAVENOUS | Status: AC
  Administered 2018-09-04: 840 mg via INTRAVENOUS
  Filled 2018-09-04: qty 42

## 2018-09-04 MED ORDER — PALONOSETRON HCL INJECTION 0.25 MG/5ML
0.2500 mg | Freq: Once | INTRAVENOUS | Status: AC
  Administered 2018-09-04: 0.25 mg via INTRAVENOUS

## 2018-09-04 MED ORDER — ATROPINE SULFATE 1 MG/ML IJ SOLN: 0.5000 mg | Freq: Once | INTRAMUSCULAR | Status: DC | PRN

## 2018-09-04 MED ORDER — SODIUM CHLORIDE 0.9% FLUSH
10.0000 mL | Freq: Once | INTRAVENOUS | Status: AC
  Administered 2018-09-04: 10 mL
  Filled 2018-09-04: qty 10

## 2018-09-04 MED ORDER — ATROPINE SULFATE 1 MG/ML IJ SOLN
INTRAMUSCULAR | Status: AC
  Filled 2018-09-04: qty 1

## 2018-09-04 MED ORDER — PALONOSETRON HCL INJECTION 0.25 MG/5ML
INTRAVENOUS | Status: AC
  Filled 2018-09-04: qty 5

## 2018-09-04 MED ORDER — ATROPINE SULFATE 1 MG/ML IJ SOLN: 0.4000 mg | Freq: Once | INTRAMUSCULAR | Status: DC | PRN

## 2018-09-04 NOTE — Patient Instructions (Signed)
[place] Discharge Instructions for Patients Receiving Chemotherapy  Today you received the following chemotherapy agents: Oxaliplatin (Eloxatin), Irinotecan (Camptosar), Leucovorin, Fluorouracil (Adrucil, 5-FU)  To help prevent nausea and vomiting after your treatment, we encourage you to take your nausea medication as directed. Received Aloxi during treatment today-->Take your Compazine (not Zofran) for the next 3 days as needed.    If you develop nausea and vomiting that is not controlled by your nausea medication, call the clinic.   BELOW ARE SYMPTOMS THAT SHOULD BE REPORTED IMMEDIATELY:  *FEVER GREATER THAN 100.5 F  *CHILLS WITH OR WITHOUT FEVER  NAUSEA AND VOMITING THAT IS NOT CONTROLLED WITH YOUR NAUSEA MEDICATION  *UNUSUAL SHORTNESS OF BREATH  *UNUSUAL BRUISING OR BLEEDING  TENDERNESS IN MOUTH AND THROAT WITH OR WITHOUT PRESENCE OF ULCERS  *URINARY PROBLEMS  *BOWEL PROBLEMS  UNUSUAL RASH Items with * indicate a potential emergency and should be followed up as soon as possible.  Feel free to call the clinic should you have any questions or concerns. The clinic phone number is [phone].  Please show the [place] at check-in to the Emergency Department and triage nurse.

## 2018-09-04 NOTE — Telephone Encounter (Signed)
Scheduled appt per 01/30 los. ° °Printed calendar and avs. °

## 2018-09-05 LAB — CANCER ANTIGEN 19-9: CA 19-9: 18 U/mL (ref 0–35)

## 2018-09-06 ENCOUNTER — Inpatient Hospital Stay: Payer: Managed Care, Other (non HMO) | Attending: Hematology

## 2018-09-06 VITALS — BP 130/78 | HR 75 | Temp 97.7°F | Resp 18

## 2018-09-06 DIAGNOSIS — I2510 Atherosclerotic heart disease of native coronary artery without angina pectoris: Secondary | ICD-10-CM | POA: Diagnosis not present

## 2018-09-06 DIAGNOSIS — R5383 Other fatigue: Secondary | ICD-10-CM | POA: Diagnosis not present

## 2018-09-06 DIAGNOSIS — Z7901 Long term (current) use of anticoagulants: Secondary | ICD-10-CM | POA: Insufficient documentation

## 2018-09-06 DIAGNOSIS — R600 Localized edema: Secondary | ICD-10-CM | POA: Insufficient documentation

## 2018-09-06 DIAGNOSIS — I700 Atherosclerosis of aorta: Secondary | ICD-10-CM | POA: Insufficient documentation

## 2018-09-06 DIAGNOSIS — L814 Other melanin hyperpigmentation: Secondary | ICD-10-CM | POA: Diagnosis not present

## 2018-09-06 DIAGNOSIS — Z791 Long term (current) use of non-steroidal anti-inflammatories (NSAID): Secondary | ICD-10-CM | POA: Insufficient documentation

## 2018-09-06 DIAGNOSIS — C259 Malignant neoplasm of pancreas, unspecified: Secondary | ICD-10-CM

## 2018-09-06 DIAGNOSIS — R0602 Shortness of breath: Secondary | ICD-10-CM | POA: Diagnosis not present

## 2018-09-06 DIAGNOSIS — Z5111 Encounter for antineoplastic chemotherapy: Secondary | ICD-10-CM | POA: Diagnosis not present

## 2018-09-06 DIAGNOSIS — F17210 Nicotine dependence, cigarettes, uncomplicated: Secondary | ICD-10-CM | POA: Insufficient documentation

## 2018-09-06 DIAGNOSIS — R05 Cough: Secondary | ICD-10-CM | POA: Diagnosis not present

## 2018-09-06 DIAGNOSIS — Z79899 Other long term (current) drug therapy: Secondary | ICD-10-CM | POA: Insufficient documentation

## 2018-09-06 DIAGNOSIS — I82B12 Acute embolism and thrombosis of left subclavian vein: Secondary | ICD-10-CM | POA: Insufficient documentation

## 2018-09-06 DIAGNOSIS — K219 Gastro-esophageal reflux disease without esophagitis: Secondary | ICD-10-CM | POA: Diagnosis not present

## 2018-09-06 DIAGNOSIS — Z7689 Persons encountering health services in other specified circumstances: Secondary | ICD-10-CM | POA: Diagnosis not present

## 2018-09-06 DIAGNOSIS — C257 Malignant neoplasm of other parts of pancreas: Secondary | ICD-10-CM | POA: Diagnosis present

## 2018-09-06 DIAGNOSIS — K5900 Constipation, unspecified: Secondary | ICD-10-CM | POA: Diagnosis not present

## 2018-09-06 MED ORDER — HEPARIN SOD (PORK) LOCK FLUSH 100 UNIT/ML IV SOLN
500.0000 [IU] | Freq: Once | INTRAVENOUS | Status: AC | PRN
  Administered 2018-09-06: 500 [IU]
  Filled 2018-09-06: qty 5

## 2018-09-06 MED ORDER — PEGFILGRASTIM-CBQV 6 MG/0.6ML ~~LOC~~ SOSY
PREFILLED_SYRINGE | SUBCUTANEOUS | Status: AC
  Filled 2018-09-06: qty 0.6

## 2018-09-06 MED ORDER — SODIUM CHLORIDE 0.9% FLUSH
10.0000 mL | INTRAVENOUS | Status: DC | PRN
  Administered 2018-09-06: 10 mL
  Filled 2018-09-06: qty 10

## 2018-09-06 MED ORDER — PEGFILGRASTIM-CBQV 6 MG/0.6ML ~~LOC~~ SOSY
6.0000 mg | PREFILLED_SYRINGE | Freq: Once | SUBCUTANEOUS | Status: AC
  Administered 2018-09-06: 6 mg via SUBCUTANEOUS

## 2018-09-08 ENCOUNTER — Ambulatory Visit (HOSPITAL_COMMUNITY)
Admission: RE | Admit: 2018-09-08 | Discharge: 2018-09-08 | Disposition: A | Discharge status: Home / Self Care | Payer: Managed Care, Other (non HMO) | Source: Ambulatory Visit | Attending: Medical | Admitting: Medical

## 2018-09-08 ENCOUNTER — Inpatient Hospital Stay (HOSPITAL_BASED_OUTPATIENT_CLINIC_OR_DEPARTMENT_OTHER): Payer: Managed Care, Other (non HMO) | Admitting: Medical

## 2018-09-08 ENCOUNTER — Telehealth: Payer: Self-pay

## 2018-09-08 ENCOUNTER — Telehealth: Payer: Self-pay | Admitting: *Deleted

## 2018-09-08 VITALS — BP 121/71 | HR 89 | Temp 97.7°F | Resp 18 | Ht 72.0 in | Wt 196.3 lb

## 2018-09-08 DIAGNOSIS — M7989 Other specified soft tissue disorders: Secondary | ICD-10-CM

## 2018-09-08 DIAGNOSIS — F17210 Nicotine dependence, cigarettes, uncomplicated: Secondary | ICD-10-CM

## 2018-09-08 DIAGNOSIS — R0602 Shortness of breath: Secondary | ICD-10-CM

## 2018-09-08 DIAGNOSIS — L814 Other melanin hyperpigmentation: Secondary | ICD-10-CM | POA: Diagnosis not present

## 2018-09-08 DIAGNOSIS — Z7901 Long term (current) use of anticoagulants: Secondary | ICD-10-CM

## 2018-09-08 DIAGNOSIS — R600 Localized edema: Secondary | ICD-10-CM

## 2018-09-08 DIAGNOSIS — C257 Malignant neoplasm of other parts of pancreas: Secondary | ICD-10-CM

## 2018-09-08 DIAGNOSIS — C259 Malignant neoplasm of pancreas, unspecified: Secondary | ICD-10-CM

## 2018-09-08 DIAGNOSIS — Z79899 Other long term (current) drug therapy: Secondary | ICD-10-CM

## 2018-09-08 DIAGNOSIS — K219 Gastro-esophageal reflux disease without esophagitis: Secondary | ICD-10-CM

## 2018-09-08 LAB — CBC WITH DIFFERENTIAL (CANCER CENTER ONLY)
Abs Immature Granulocytes: 0.8 10*3/uL — ABNORMAL HIGH (ref 0.00–0.07)
Band Neutrophils: 17 %
Basophils Absolute: 0.4 10*3/uL — ABNORMAL HIGH (ref 0.0–0.1)
Basophils Relative: 1 %
EOS PCT: 0 %
Eosinophils Absolute: 0 10*3/uL (ref 0.0–0.5)
HCT: 38.8 % — ABNORMAL LOW (ref 39.0–52.0)
Hemoglobin: 13 g/dL (ref 13.0–17.0)
Lymphocytes Relative: 3 %
Lymphs Abs: 1.2 10*3/uL (ref 0.7–4.0)
MCH: 30.8 pg (ref 26.0–34.0)
MCHC: 33.5 g/dL (ref 30.0–36.0)
MCV: 91.9 fL (ref 80.0–100.0)
Metamyelocytes Relative: 2 %
Monocytes Absolute: 1.2 10*3/uL — ABNORMAL HIGH (ref 0.1–1.0)
Monocytes Relative: 3 %
Neutro Abs: 35.9 10*3/uL — ABNORMAL HIGH (ref 1.7–17.7)
Neutrophils Relative %: 74 %
Platelet Count: 112 10*3/uL — ABNORMAL LOW (ref 150–400)
RBC: 4.22 MIL/uL (ref 4.22–5.81)
RDW: 15.2 % (ref 11.5–15.5)
WBC Count: 39.5 10*3/uL — ABNORMAL HIGH (ref 4.0–10.5)
nRBC: 0 % (ref 0.0–0.2)

## 2018-09-08 LAB — CMP (CANCER CENTER ONLY)
ALK PHOS: 140 U/L — AB (ref 38–126)
ALT: 35 U/L (ref 0–44)
AST: 16 U/L (ref 15–41)
Albumin: 3.7 g/dL (ref 3.5–5.0)
Anion gap: 8 (ref 5–15)
BUN: 12 mg/dL (ref 6–20)
CO2: 29 mmol/L (ref 22–32)
Calcium: 9 mg/dL (ref 8.9–10.3)
Chloride: 101 mmol/L (ref 98–111)
Creatinine: 0.77 mg/dL (ref 0.61–1.24)
GFR, Estimated: >60 mL/min (ref >60)
Glucose, Bld: 102 mg/dL — ABNORMAL HIGH (ref 70–99)
Potassium: 4.1 mmol/L (ref 3.5–5.1)
Sodium: 138 mmol/L (ref 135–145)
Total Bilirubin: 0.3 mg/dL (ref 0.3–1.2)
Total Protein: 6.5 g/dL (ref 6.5–8.1)

## 2018-09-08 LAB — D-DIMER, QUANTITATIVE: D-Dimer, Quant: 2.08 ug/mL-FEU — ABNORMAL HIGH (ref 0.00–0.50)

## 2018-09-08 MED ORDER — RIVAROXABAN (XARELTO) VTE STARTER PACK (15 & 20 MG): ORAL_TABLET | ORAL | 0 refills | Status: DC

## 2018-09-08 NOTE — Progress Notes (Signed)
These preliminary result these preliminary results were noted.  Awaiting final report.

## 2018-09-08 NOTE — Patient Instructions (Signed)

## 2018-09-08 NOTE — Progress Notes (Signed)
Pt presents today with redness, tenderness, and swelling above L sided chest port/L side of neck.  Afebrile.  No rash or recent injury.  Denies pain but reports tenderness to touch.  Area warm.  Denies dizziness/weakness.  Denies CP or SOB but reports some DOE.   Pt returned to [facility] after receiving peripheral stick for D-dimer.  Reassessed by Edwena Felty and MD Burr Medico.  Pt ambulated with pulse oximeter.  O2 sats stayed between 96-98% and HR stayed between 85-95 bpm.  Tolerated well.  A&Ox4.  Ambulatory w/steady gait.  Some quiet wheezing audible intermittently.  Denies CP, reports SOB/DOE.  Denies dizziness/weakness.   PA Lucianne Lei aware.  Pt will be contacted to follow up on results of lab test today.

## 2018-09-08 NOTE — Progress Notes (Signed)
These results were called to [name] and were reviewed with him . He was begun on Xarelto. His questions were answered. He expressed understanding.

## 2018-09-08 NOTE — Telephone Encounter (Signed)
"  Ernest Wu's wife [name] [phone]).  He received his fifth chemotherapy Thursday through Saturday.  This mornng he noticed the vein that runs up his left arm and neck is red as fire.  Looks like a fluid running up vein of his neck.  I'm at work."  Called patient for further assessment. "The vein that runs up the left side of my neck feels puffy.  From my left collar bone up, the vein looks red and visibly larger.  Have not checked my temperature; drinking coffee but skin does not feel warmer to touch.  Moving my arm well.  No pain except for the aching bones I have with treatments.  No pushing pulling or lifting yesterday.  Nauseated but no dizziness.  No change in vision."  Advised to report to ED for further evaluation.

## 2018-09-08 NOTE — Progress Notes (Addendum)
LUE venous duplex       has been completed. Preliminary results can be found under CV proc through chart review. June Leap, BS, RDMS, RVT    Called results to [employer]. Inconclusive study, although no obvious DVT noted at this time. Patient to remain in waiting room until hearing from provider.

## 2018-09-08 NOTE — Progress Notes (Signed)
These results were called to [name] and were reviewed with him . His were answered. He expressed understanding.

## 2018-09-08 NOTE — Telephone Encounter (Signed)
Spoke with patient advised him per Dr. Burr Medico to be seen by Sandi Mealy PA in Symptom Management this morning instead of going to the ED, he verbalized an understanding and sent high priority scheduling message to add him for 10:30

## 2018-09-09 LAB — CANCER ANTIGEN 19-9: CA 19-9: 17 U/mL (ref 0–35)

## 2018-09-09 NOTE — Progress Notes (Signed)
These results were called to Ernest Wu and were reviewed with him . His questions were answered. He expressed understanding. He was begun on Xarelto given that a d-dimer returned elevated at 2.08.

## 2018-09-10 ENCOUNTER — Other Ambulatory Visit (HOSPITAL_COMMUNITY): Payer: Managed Care, Other (non HMO)

## 2018-09-10 NOTE — Progress Notes (Addendum)
[facility] Progress Note   WANE MOLLETT [id number] 08/03/1961 58 y.o.  [name] is managed by Dr. [name]  Actively treated with chemotherapy/immunotherapy/hormonal therapy: yes  Current Therapy: Folfirinox  Last Treated: 09/04/2018 (Cycle 5 Day 1)  Assessment: Plan:    Left arm swelling - Plan: VAS [name] UPPER EXTREMITY VENOUS DUPLEX, D-dimer, quantitative, ECHOCARDIOGRAM COMPLETE, Rivaroxaban 15 & 20 MG TBPK, CANCELED: ECHOCARDIOGRAM COMPLETE BUBBLE STUDY  Other specified soft tissue disorders - Plan: MR MRA CHEST W CONTRAST  Adenocarcinoma of pancreas ([place])   1) Adenocarcinoma of the pancreas: The patient is followed by Dr. Burr Medico and is s/p Cycle 5 of Folfirinox which he received on 09/04/2018.  2) Left arm swelling: The patient was seen with Dr. Burr Medico.  He was referred for an US of the left upper extremity.  It returned negative but positive for venous flow changes suggestive of a more proximal obstruction.  A D-dimer returned elevated at 2.08.  The patient was referred for an echo-cardiogram and was begun on Xarelto.   Please see After Visit Summary for patient specific instructions.  Future Appointments  Date Time Provider [place]  09/18/2018  7:45 AM [facility] LAB 4 [facility] None  09/18/2018  8:00 AM [facility] [place] None  09/18/2018  8:15 AM [name], MD [facility] None  09/18/2018  9:00 AM [facility] INFUSION [facility] None  09/20/2018 12:00 PM [facility] [place] FLUSH [facility] None  10/02/2018  7:45 AM [facility] LAB 3 [facility] None  10/02/2018  8:00 AM [facility] [place] FLUSH [facility] None  10/02/2018  8:15 AM [name], MD [facility] None  10/02/2018  9:00 AM [facility] INFUSION [facility] None  10/04/2018 12:00 PM [facility] [place] FLUSH [facility] None  10/16/2018  9:30 AM [facility] LAB 2 [facility] None  10/16/2018  9:45 AM [facility] [place] FLUSH [facility] None  10/16/2018 10:15 AM [name], MD [facility] None  10/16/2018  10:45 AM [facility] INFUSION [facility] None  10/18/2018 11:15 AM [facility] [place] FLUSH [facility] None    Orders Placed This Encounter  Procedures  . MR MRA CHEST W CONTRAST  . D-dimer, quantitative  . ECHOCARDIOGRAM COMPLETE       Subjective:   Patient ID:  SHLOIMY MICHALSKI is a 59 y.o. (DOB [birth year]) male.  Chief Complaint:  Chief Complaint  Patient presents with  . Vascular Access Problem    HPI STOKELY JEANCHARLES is a 58 y.o. male with an adenocarcinoma of the pancreas which is borderline resectable.  He is managed by Dr. Burr Medico and is s/p Cycle 5 of Folfirinox which was dosed on 09/04/2018.  He presents today with acute swelling of his left neck with engorgement of left upper extremity vasculature.  He denies changes in activity or trauma.  He has had mild shortness of breath and mild midsternal pain.  Medications: I have reviewed the patient's current medications.  Allergies:  Allergies  Allergen Reactions  . Contrast Media [Iodinated Diagnostic Agents] Other (See Comments)    Burning sensation [SEE CONTRAST MEDIA]  . Iohexol Anaphylaxis, Itching and Rash    Past Medical History:  Diagnosis Date  . Cancer ([place]) 06/2018   Pancreatic  . GERD (gastroesophageal reflux disease)     Past Surgical History:  Procedure Laterality Date  . BILIARY STENT PLACEMENT  06/10/2018   Procedure: BILIARY STENT PLACEMENT;  Surgeon: Ronnette Juniper, MD;  Location: [facility] ENDOSCOPY;  Service: Gastroenterology;;  . ERCP N/A 06/10/2018   Procedure: ENDOSCOPIC RETROGRADE CHOLANGIOPANCREATOGRAPHY (ERCP);  Surgeon: Ronnette Juniper, MD;  Location: [facility] ENDOSCOPY;  Service: Gastroenterology;  Laterality: N/A;  . ESOPHAGOGASTRODUODENOSCOPY (EGD) WITH PROPOFOL N/A 06/10/2018   Procedure: ESOPHAGOGASTRODUODENOSCOPY (EGD) WITH PROPOFOL;  Surgeon: Ronnette Juniper, MD;  Location: [place];  Service: Gastroenterology;  Laterality: N/A;  . FINE NEEDLE ASPIRATION  06/10/2018   Procedure: FINE NEEDLE ASPIRATION (FNA)  LINEAR;  Surgeon: Ronnette Juniper, MD;  Location[facility] ENDOSCOPY;  Service: Gastroenterology;;  . Sol Passer PLACEMENT N/A 07/02/2018   Procedure: INSERTION PORT-A-CATH;  Surgeon: Stark Klein, MD;  Location: [place];  Service: General;  Laterality: N/A;  . SPHINCTEROTOMY  06/10/2018   Procedure: SPHINCTEROTOMY;  Surgeon: Ronnette Juniper, MD;  Location: [facility] ENDOSCOPY;  Service: Gastroenterology;;  . UPPER ESOPHAGEAL ENDOSCOPIC ULTRASOUND (EUS) N/A 06/10/2018   Procedure: UPPER ESOPHAGEAL ENDOSCOPIC ULTRASOUND (EUS);  Surgeon: Ronnette Juniper, MD;  Location: [place];  Service: Gastroenterology;  Laterality: N/A;  . VASECTOMY      No family history on file.  Social History   Socioeconomic History  . Marital status: Married    Spouse name: Not on file  . Number of children: 5  . Years of education: Not on file  . Highest education level: Not on file  Occupational History  . Not on file  Social Needs  . Financial resource strain: Not on file  . Food insecurity:    Worry: Not on file    Inability: Not on file  . Transportation needs:    Medical: Not on file    Non-medical: Not on file  Tobacco Use  . Smoking status: Current Every Day Smoker    Packs/day: 0.25    Years: 30.00    Pack years: 7.50  . Smokeless tobacco: Never Used  . Tobacco comment: cut back considerably  Substance and Sexual Activity  . Alcohol use: Yes    Alcohol/week: 21.0 standard drinks    Types: 21 Cans of beer per week  . Drug use: Never  . Sexual activity: Not on file  Lifestyle  . Physical activity:    Days per week: Not on file    Minutes per session: Not on file  . Stress: Not on file  Relationships  . Social connections:    Talks on phone: Not on file    Gets together: Not on file    Attends religious service: Not on file    Active member of club or organization: Not on file    Attends meetings of clubs or organizations: Not on file    Relationship status: Not on file  . Intimate partner violence:     Fear of current or ex partner: Not on file    Emotionally abused: Not on file    Physically abused: Not on file    Forced sexual activity: Not on file  Other Topics Concern  . Not on file  Social History Narrative  . Not on file    Past Medical History, Surgical history, Social history, and Family history were reviewed and updated as appropriate.   Please see review of systems for further details on the patient's review from today.   Review of Systems:  Review of Systems  Constitutional: Negative for chills, diaphoresis and fever.  HENT: Negative for trouble swallowing and voice change.   Respiratory: Positive for shortness of breath. Negative for cough, chest tightness and wheezing.   Cardiovascular: Positive for chest pain. Negative for palpitations.  Gastrointestinal: Negative for abdominal pain, constipation, diarrhea, nausea and vomiting.  Musculoskeletal: Negative for back pain and myalgias.  Left neck swelling Engorgement of the vasculature of the left upper extremity  Neurological: Negative for dizziness, light-headedness and headaches.    Objective:   Physical Exam:  BP 121/71 (BP Location: Right Arm, Patient Position: Sitting)   Pulse 89   Temp 97.7 F (36.5 C) (Oral)   Resp 18   Ht 6' (1.829 m)   Wt 196 lb 4.8 oz (89 kg)   SpO2 98%   BMI 26.62 kg/m  ECOG: 0  Physical Exam Constitutional:      General: He is not in acute distress.    Appearance: He is not diaphoretic.  HENT:     Head: Normocephalic and atraumatic.  Neck:     Comments: Non-tender soft tissue swelling of the base of the left inferior lateral neck. Cardiovascular:     Rate and Rhythm: Normal rate and regular rhythm.     Heart sounds: Normal heart sounds. No murmur. No friction rub. No gallop.   Pulmonary:     Effort: Pulmonary effort is normal. No respiratory distress.     Breath sounds: Normal breath sounds. No wheezing or rales.  Skin:    General: Skin is warm and dry.      Findings: No erythema or rash.     Comments: Engorgement of the vasculature of the chest and left upper extremity.  Skin of the left upper extremity appears dusky.  Neurological:     Mental Status: He is alert.     Gait: Gait normal.  Psychiatric:        Mood and Affect: Mood normal.        Behavior: Behavior normal.        Thought Content: Thought content normal.        Judgment: Judgment normal.     Lab Review:     Component Value Date/Time   NA 138 09/08/2018 1242   K 4.1 09/08/2018 1242   CL 101 09/08/2018 1242   CO2 29 09/08/2018 1242   GLUCOSE 102 (H) 09/08/2018 1242   BUN 12 09/08/2018 1242   CREATININE 0.77 09/08/2018 1242   CALCIUM 9.0 09/08/2018 1242   PROT 6.5 09/08/2018 1242   ALBUMIN 3.7 09/08/2018 1242   AST 16 09/08/2018 1242   ALT 35 09/08/2018 1242   ALKPHOS 140 (H) 09/08/2018 1242   BILITOT 0.3 09/08/2018 1242   GFRNONAA >60 09/08/2018 1242   GFRAA >60 09/08/2018 1242       Component Value Date/Time   WBC 39.5 (H) 09/08/2018 1242   WBC 3.8 (L) 07/18/2018 2106   RBC 4.22 09/08/2018 1242   HGB 13.0 09/08/2018 1242   HCT 38.8 (L) 09/08/2018 1242   PLT 112 (L) 09/08/2018 1242   MCV 91.9 09/08/2018 1242   MCH 30.8 09/08/2018 1242   MCHC 33.5 09/08/2018 1242   RDW 15.2 09/08/2018 1242   LYMPHSABS 1.2 09/08/2018 1242   MONOABS 1.2 (H) 09/08/2018 1242   EOSABS 0.0 09/08/2018 1242   BASOSABS 0.4 (H) 09/08/2018 1242   -------------------------------  Imaging from last 24 hours (if applicable):  Radiology interpretation: Mr Abdomen W Wo Contrast  Result Date: 09/03/2018 CLINICAL DATA:  Pancreatic adenocarcinoma. EXAM: MRI ABDOMEN WITHOUT AND WITH CONTRAST TECHNIQUE: Multiplanar multisequence MR imaging of the abdomen was performed both before and after the administration of intravenous contrast. CONTRAST:  9 mL Gadavist COMPARISON:  MRI 07/24/2018, CT 06/07/2018 FINDINGS: Lower chest:  Lung bases are clear. Hepatobiliary: The subtle enhancing lesion  previous described in segment 4A is not well identified.  No new hepatic lesions are present. Mild intrahepatic biliary duct dilatation within the LEFT hepatic lobe. This is similar to comparison MRI. Biliary stent in the distal common bile duct. Gallbladder normal. Pancreas: Again demonstrated hypoenhancing mass in the mid pancreas measures 3.0 x 2.9 cm (image 51/1002) compared with 2.9 x 2.9 cm on MRI 07/24/2018. There is pancreatic duct dilatation upstream from this mass. Mass abuts the confluence of the splenic vein and portal vein. Mass appears separated from the SMA and celiac trunk. Spleen: Normal spleen. Adrenals/urinary tract: Adrenal glands and kidneys are normal. Stomach/Bowel: Stomach and limited of the small bowel is unremarkable Vascular/Lymphatic: Abdominal aortic normal caliber. No retroperitoneal periportal lymphadenopathy. Musculoskeletal: No aggressive osseous lesion IMPRESSION: 1. Stable mass (adenocarcinoma) in the neck of the pancreas with upstream duct dilatation. 2. No evidence of lymphadenopathy in the porta hepatis or peripancreatic fat. 3. No evidence hepatic metastasis. 4. Mild biliary duct dilatation LEFT hepatic lobe similar to comparison exam. Biliary stent within the common bile duct. Electronically Signed   By: Suzy Bouchard M.D.   On: 09/03/2018 10:22   Mr 3d Recon At Scanner  Result Date: 09/03/2018 CLINICAL DATA:  Pancreatic adenocarcinoma. EXAM: MRI ABDOMEN WITHOUT AND WITH CONTRAST TECHNIQUE: Multiplanar multisequence MR imaging of the abdomen was performed both before and after the administration of intravenous contrast. CONTRAST:  9 mL Gadavist COMPARISON:  MRI 07/24/2018, CT 06/07/2018 FINDINGS: Lower chest:  Lung bases are clear. Hepatobiliary: The subtle enhancing lesion previous described in segment 4A is not well identified. No new hepatic lesions are present. Mild intrahepatic biliary duct dilatation within the LEFT hepatic lobe. This is similar to comparison MRI.  Biliary stent in the distal common bile duct. Gallbladder normal. Pancreas: Again demonstrated hypoenhancing mass in the mid pancreas measures 3.0 x 2.9 cm (image 51/1002) compared with 2.9 x 2.9 cm on MRI 07/24/2018. There is pancreatic duct dilatation upstream from this mass. Mass abuts the confluence of the splenic vein and portal vein. Mass appears separated from the SMA and celiac trunk. Spleen: Normal spleen. Adrenals/urinary tract: Adrenal glands and kidneys are normal. Stomach/Bowel: Stomach and limited of the small bowel is unremarkable Vascular/Lymphatic: Abdominal aortic normal caliber. No retroperitoneal periportal lymphadenopathy. Musculoskeletal: No aggressive osseous lesion IMPRESSION: 1. Stable mass (adenocarcinoma) in the neck of the pancreas with upstream duct dilatation. 2. No evidence of lymphadenopathy in the porta hepatis or peripancreatic fat. 3. No evidence hepatic metastasis. 4. Mild biliary duct dilatation LEFT hepatic lobe similar to comparison exam. Biliary stent within the common bile duct. Electronically Signed   By: Suzy Bouchard M.D.   On: 09/03/2018 10:22   Vas [name] Upper Extremity Venous Duplex  Result Date: 09/09/2018 UPPER VENOUS STUDY  Indications: increased swelling/ tightness around neck and clavicle left side Other Indications: Left port placement 06/2018. Performing Technologist: June Leap RDMS, RVT  Examination Guidelines: A complete evaluation includes B-mode imaging, spectral Doppler, color Doppler, and power Doppler as needed of all accessible portions of each vessel. Bilateral testing is considered an integral part of a complete examination. Limited examinations for reoccurring indications may be performed as noted.  Right Findings: +----------+------------+----------+---------+-----------+-------+ RIGHT     CompressiblePropertiesPhasicitySpontaneousSummary +----------+------------+----------+---------+-----------+-------+ Subclavian                          Yes       Yes            +----------+------------+----------+---------+-----------+-------+  Left Findings: +----------+------------+----------+---------+-----------+---------------+ LEFT      CompressiblePropertiesPhasicitySpontaneous  Summary     +----------+------------+----------+---------+-----------+---------------+ IJV           Full                 No        Yes                    +----------+------------+----------+---------+-----------+---------------+ Subclavian                         No        Yes    continuous flow +----------+------------+----------+---------+-----------+---------------+ Axillary                           No        Yes    continuous flow +----------+------------+----------+---------+-----------+---------------+ Brachial      Full                 No        Yes    continuous flow +----------+------------+----------+---------+-----------+---------------+ Radial        Full                                                  +----------+------------+----------+---------+-----------+---------------+ Ulnar         Full                                                  +----------+------------+----------+---------+-----------+---------------+ Cephalic      Full                                                  +----------+------------+----------+---------+-----------+---------------+ Basilic       Full                                                  +----------+------------+----------+---------+-----------+---------------+  Summary:  Right: No evidence of thrombosis in the subclavian.  Left: No evidence of deep vein thrombosis in the upper extremity. No evidence of superficial vein thrombosis in the upper extremity. Unable to fully compress subclavian and axillary veins, although no thrombus noted. As well as continuous venous flow documented which is suggestive of a more proximal obstruction that cannot be visualized on  this study.  *See table(s) above for measurements and observations.  Diagnosing physician: Ruta Hinds MD Electronically signed by Ruta Hinds MD on 09/09/2018 at 10:26:34 AM.    Final         This patient was seen with Dr. Burr Medico with my treatment plan reviewed with her. She expressed agreement with my medical management of this patient.   Addendum  I have seen the patient, examined him. I agree with the assessment and and plan and have edited the notes.   Mr Closs presented with left arm mild edema and left [place] edema, mild skin hyperpigmentation on left arm. Clinically suspecious for DVT. His doppler of left arm was  negative. D-dimer is positive, no dyspnea or hypoxia. He hs CT contrast allergy, will get a MRI venogram of chest to rule out thrombosis. Due to the clinically high concern of DVT, I will start him on Xarelto today and monitor closely. He knows to call us to go to ED if his symptoms get worse.   [name]  09/08/2018

## 2018-09-12 ENCOUNTER — Other Ambulatory Visit: Payer: Self-pay

## 2018-09-12 NOTE — Progress Notes (Signed)
Recommendations from 09/10/18 GI Tumor Board:  Continue neoadjuvant chemotherapy for 4 months. Repeat CT chest w/o contrast for lung nodules.

## 2018-09-15 ENCOUNTER — Telehealth: Payer: Self-pay

## 2018-09-15 NOTE — Telephone Encounter (Signed)
Spoke with patient's spouse [name] informed her of appointment for MRA scan tomorrow at 5:00 to arrive at 4:30 to [facility] radiology.  She verbalized an understanding.

## 2018-09-15 NOTE — Progress Notes (Signed)
[place]   [phone] [phone]   Clinic Follow up Note   Patient Care Team: Street, [name], MD as PCP - General (Family Medicine) 09/18/2018  CHIEF COMPLAINT: F/u on pancreatic cancer  SUMMARY OF ONCOLOGIC HISTORY: Oncology History   Cancer Staging Adenocarcinoma of pancreas [facility]) Staging form: Exocrine Pancreas, AJCC 8th Edition - Clinical stage from 06/10/2018: Stage IB (cT2, cN0, cM0) - Signed by [name], MD on 06/29/2018       Adenocarcinoma of pancreas ([place])   06/07/2018 Imaging    CT AP w Contrast IMPRESSION: 1. There is a low attenuation mass centered around the neck of pancreas which is concerning for neoplasm. Pancreatic adenocarcinoma favored. This results in common bile duct obstruction with mild intrahepatic biliary ductal dilatation. There also is involvement of the portal venous confluence. Further evaluation with nonemergent contrast enhanced MRI of the pancreas is recommended. 2. Small indeterminate low-attenuation structure is noted within segment 4 a of the liver. This could be better addressed at MRI.    06/09/2018 Imaging    MR ABD MRCP  The pancreatic mass involves approximately 40 percent of the main portal vein circumference at the portal splenic venous confluence, with associated mild narrowing of the portal splenic venous confluence. The SMV, splenic vein and main, right and left portal veins remain patent. The celiac trunk and SMA are not involved by the pancreatic mass.  IMPRESSION: 2.3 cm diameter hypoenhancing mass at the junction of the head and neck of pancreas with pancreatic ductal dilatation, likely adenocarcinoma.  Intra and extrahepatic bile duct dilatation with abrupt change in caliber at the mid common bile duct. This could indicate an obstructing mass or stricture.     06/10/2018 Initial Biopsy    Diagnosis PANCREAS, FINE NEEDLE ASPIRATION (SPECIMEN 1 OF 1 COLLECTED 06/10/18): MALIGNANT  CELLS CONSISTENT WITH ADENOCARCINOMA.    06/10/2018 Procedure    IMPRESSION: 1. 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 2. Placement of a metallic biliary stent.    06/10/2018 Procedure    EUS per Dr. Paulita Fujita Impression:  - There was dilation in the common bile duct which measured up to 12 mm. - A mass was identified in the pancreatic head. This was staged T3 N1 Mx by endosonographic criteria. Fine needle aspiration performed. - A few lymph nodes were visualized and measured in the peripancreatic region. - There was no evidence of significant pathology in the left lobe of the liver.    06/10/2018 Cancer Staging    Staging form: Exocrine Pancreas, AJCC 8th Edition - Clinical stage from 06/10/2018: Stage IB (cT2, cN0, cM0) - Signed by [name], MD on 06/29/2018    06/27/2018 Initial Diagnosis    Adenocarcinoma of pancreas ([place])    07/02/2018 Imaging    IMPRESSION: 1. Multiple small pulmonary nodules scattered throughout the lungs measuring up to 7 mm in size. These are nonspecific, but the possibility of metastatic disease should be considered, and close attention at time of routine followups is recommended. 2. In addition, today's study demonstrates new and enlarging low-attenuation lesions in the liver. This is poorly evaluated on today's noncontrast CT examination, but is concerning for potential metastatic disease. Further evaluation with repeat nonemergent MRI of the abdomen with and without IV gadolinium is suggested in the near future to better evaluate these findings. 3. Aortic atherosclerosis, in addition to left main and 2 vessel coronary artery disease. Please note that although the presence  of coronary artery calcium documents the presence of coronary artery disease, the severity of this disease and any potential stenosis cannot be assessed on this non-gated CT examination. Assessment for potential risk factor  modification, dietary therapy or pharmacologic therapy may be warranted, if clinically indicated. 4. Mild aneurysmal dilatation of the ascending thoracic aorta (4.8 cm in diameter). Ascending thoracic aortic aneurysm. Recommend semi-annual imaging followup by CTA or MRA and referral to cardiothoracic surgery if not already obtained. This recommendation follows 2010 ACCF/AHA/AATS/ACR/ASA/SCA/SCAI/SIR/STS/SVM Guidelines for the Diagnosis and Management of Patients With Thoracic Aortic Disease. Circulation. 2010; 121: P295-J884.  Aortic Atherosclerosis (ICD10-I70.0). Aortic aneurysm NOS (ICD10-I71.9).    07/04/2018 -  Chemotherapy     FOLFIRINOX q2 weeks    07/15/2018 Imaging    07/15/2018 Liver US IMPRESSION: No liver lesions identified with ultrasound. Ultrasound-guided biopsy was not performed. Recommend further characterization for liver lesions with a repeat MRI, with and without contrast.    07/24/2018 Imaging    07/24/2018 MRI Abdomen IMPRESSION: 1. The hypoenhancing mass at the junction of the pancreatic body and head has reduced in size, previously 3.2 by 2.8 cm and currently 2.9 by 2.2 cm. A small peripancreatic lymph node adjacent to the mass was previously 0.9 cm in short axis and is currently 0.7 cm in short axis. The amount of contact between the pancreatic mass and the confluence of the splenic vein and SMV is similar to the prior exam. 2. There is a 6 mm probable hemangioma in segment 4a of the liver, based on the delayed enhancement pattern. This is somewhat ill-defined. The lesion appeared larger on the prior noncontrast CT but presumably may have been overestimated on that exam. There is also some hypodensity along the dome of the right hepatic lobe which appears to most likely be due to a slip of the diaphragm rather than a discernible lesion on MRI. 3. Aortic Atherosclerosis (ICD10-I70.0). Notably, there is a small amount of mural thrombus along the right  side of the abdominal aorta below the right renal artery level which has not been seen previously.    09/03/2018 Imaging    09/03/2018 MRI Abdomen IMPRESSION: 1. Stable mass (adenocarcinoma) in the neck of the pancreas with upstream duct dilatation. 2. No evidence of lymphadenopathy in the porta hepatis or peripancreatic fat. 3. No evidence hepatic metastasis. 4. Mild biliary duct dilatation LEFT hepatic lobe similar to comparison exam. Biliary stent within the common bile duct.    09/16/2018 Imaging    MR MRA CHEST W WO CONTRAST  IMPRESSION: VASCULAR  1. 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 2. Left subclavian port catheter to the SVC. 3. SVC is patent       CURRENT THERAPY  NeoadjuvantFOLFIRINOX q2 weeks, started on 07/05/2018  INTERVAL HISTORY: Ernest Wu is a 58 y.o. male who is here for follow-up. He saw PA Lucianne Lei on 09/08/2018 due acute swelling of his left neck with engorment of left upper extremity vasculature and started Cana. He had a MR venogram of Chest. Today, he is here with his wife. He still has swelling around his neck and left arm, but overall improved. His left arm is darker than his right arm. He takes [place] one in the morning and one in at night and the swelling is improving. He has worsening numbness and coldness on his fingers. He is feeling constipated and takes Miralax once a day but it is not helping. He denies stomach pain. He has  reduced his smoking but smokes half a pack a day.    Pertinent positives and negatives of review of systems are listed and detailed within the above HPI.  REVIEW OF SYSTEMS:   Constitutional: Denies fevers, chills or abnormal weight loss,  (+) swelling on left arm and neck  Eyes: Denies blurriness of vision Ears, nose, mouth, throat, and face: Denies mucositis or sore throat Respiratory: Denies cough, dyspnea or wheezes Cardiovascular: Denies palpitation,  chest discomfort or lower extremity swelling Gastrointestinal:  Denies nausea, heartburn, (+) constipation  Skin: Denies abnormal skin rashes, (+) darkness on left arm  Lymphatics: Denies new lymphadenopathy or easy bruising Neurological:Denies new weaknesses, (+) worsening  numbness and coldness on fingers  Behavioral/Psych: Mood is stable, no new changes  All other systems were reviewed with the patient and are negative.  MEDICAL HISTORY:  Past Medical History:  Diagnosis Date  . Cancer ([place]) 06/2018   Pancreatic  . GERD (gastroesophageal reflux disease)     SURGICAL HISTORY: Past Surgical History:  Procedure Laterality Date  . BILIARY STENT PLACEMENT  06/10/2018   Procedure: BILIARY STENT PLACEMENT;  Surgeon: Ronnette Juniper, MD;  Location: [facility] ENDOSCOPY;  Service: Gastroenterology;;  . ERCP N/A 06/10/2018   Procedure: ENDOSCOPIC RETROGRADE CHOLANGIOPANCREATOGRAPHY (ERCP);  Surgeon: Ronnette Juniper, MD;  Location: [place];  Service: Gastroenterology;  Laterality: N/A;  . ESOPHAGOGASTRODUODENOSCOPY (EGD) WITH PROPOFOL N/A 06/10/2018   Procedure: ESOPHAGOGASTRODUODENOSCOPY (EGD) WITH PROPOFOL;  Surgeon: Ronnette Juniper, MD;  Location: [place];  Service: Gastroenterology;  Laterality: N/A;  . FINE NEEDLE ASPIRATION  06/10/2018   Procedure: FINE NEEDLE ASPIRATION (FNA) LINEAR;  Surgeon: Ronnette Juniper, MD;  Location: [facility] ENDOSCOPY;  Service: Gastroenterology;;  . Sol Passer PLACEMENT N/A 07/02/2018   Procedure: INSERTION PORT-A-CATH;  Surgeon: Stark Klein, MD;  Location: [place];  Service: General;  Laterality: N/A;  . SPHINCTEROTOMY  06/10/2018   Procedure: SPHINCTEROTOMY;  Surgeon: Ronnette Juniper, MD;  Location: [facility] ENDOSCOPY;  Service: Gastroenterology;;  . UPPER ESOPHAGEAL ENDOSCOPIC ULTRASOUND (EUS) N/A 06/10/2018   Procedure: UPPER ESOPHAGEAL ENDOSCOPIC ULTRASOUND (EUS);  Surgeon: Ronnette Juniper, MD;  Location: [place];  Service: Gastroenterology;  Laterality: N/A;  . VASECTOMY      I have  reviewed the social history and family history with the patient and they are unchanged from previous note.  ALLERGIES:  is allergic to contrast media [iodinated diagnostic agents] and iohexol.  MEDICATIONS:  Current Outpatient Medications  Medication Sig Dispense Refill  . ALPRAZolam (XANAX) 0.25 MG tablet Take 1 tablet (0.25 mg total) by mouth at bedtime as needed for anxiety. 30 tablet 0  . diphenoxylate-atropine (LOMOTIL) 2.5-0.025 MG tablet Take 2 tablets by mouth 4 (four) times daily as needed for diarrhea or loose stools. 40 tablet 0  . esomeprazole (NEXIUM) 20 MG capsule Take 20 mg by mouth daily.     . folic acid (FOLVITE) 1 MG tablet Take 1 mg by mouth daily.    Marland Kitchen ibuprofen (ADVIL,MOTRIN) 200 MG tablet Take 400 mg by mouth daily as needed for headache or moderate pain.     Marland Kitchen lidocaine-prilocaine (EMLA) cream Apply to affected area once 30 g 3  . LORazepam (ATIVAN) 1 MG tablet Take 1 tab 45 mins before MRI, if not enough, OK to take second tab right before MRI    . magic mouthwash w/lidocaine SOLN Take 5 mLs by mouth 4 (four) times daily as needed for mouth pain. 240 mL 2  . ondansetron (ZOFRAN) 8 MG tablet Take 1 tablet (8 mg total) by  mouth 2 (two) times daily as needed for refractory nausea / vomiting. Start on day 3 after chemotherapy. 30 tablet 1  . oxyCODONE (OXY IR/ROXICODONE) 5 MG immediate release tablet Take 1 tablet (5 mg total) by mouth every 6 (six) hours as needed for severe pain. 20 tablet 0  . prochlorperazine (COMPAZINE) 10 MG tablet Take 1 tablet (10 mg total) by mouth every 6 (six) hours as needed (NAUSEA). 30 tablet 1  . Rivaroxaban 15 & 20 MG TBPK Take as directed on package: Start with one 15mg  tablet by mouth twice a day with food. On Day 22, switch to one 20mg  tablet once a day. 51 each 0  . simethicone (MYLICON) 315 MG chewable tablet Chew 125 mg by mouth every 6 (six) hours as needed for flatulence.     No current facility-administered medications for this  visit.    Facility-Administered Medications Ordered in Other Visits  Medication Dose Route Frequency Provider Last Rate Last Dose  . atropine injection 0.4 mg  0.4 mg Intravenous Once PRN [name], MD      . dextrose 5 % solution   Intravenous Once [name], MD      . fluorouracil (ADRUCIL) 5,000 mg in sodium chloride 0.9 % 150 mL chemo infusion  2,390 mg/m2 (Treatment Plan Recorded) Intravenous 1 day or 1 dose [name], MD      . heparin lock flush 100 unit/mL  500 Units Intracatheter Once PRN [name], MD      . irinotecan (CAMPTOSAR) 320 mg in dextrose 5 % 500 mL chemo infusion  150 mg/m2 (Treatment Plan Recorded) Intravenous Once [name], MD      . leucovorin 840 mg in dextrose 5 % 250 mL infusion  400 mg/m2 (Treatment Plan Recorded) Intravenous Once [name], MD      . oxaliplatin (ELOXATIN) 180 mg in dextrose 5 % 500 mL chemo infusion  85 mg/m2 (Treatment Plan Recorded) Intravenous Once [name], MD 268 mL/hr at 09/18/18 0957 180 mg at 09/18/18 0957  . sodium chloride flush (NS) 0.9 % injection 10 mL  10 mL Intracatheter PRN [name], MD      . sodium chloride flush (NS) 0.9 % injection 10 mL  10 mL Intracatheter PRN [name], MD        PHYSICAL EXAMINATION: ECOG PERFORMANCE STATUS: 1 - Symptomatic but completely ambulatory  Vitals:   09/18/18 0814  BP: 124/80  Pulse: 62  Resp: 17  Temp: 98.5 F (36.9 C)  SpO2: 97%   Filed Weights   09/18/18 0814  Weight: 202 lb 8 oz (91.9 kg)    GENERAL:alert, no distress and comfortable SKIN: skin color, texture, turgor are normal, no rashes or significant lesions, (+) darkness on left arm  EYES: normal, Conjunctiva are pink and non-injected, sclera clear OROPHARYNX:no exudate, no erythema and lips, buccal mucosa, and tongue normal  NECK: supple, thyroid normal size, non-tender, without nodularity, (+)left side neck swelling  LYMPH:  no palpable lymphadenopathy in the cervical, axillary or inguinal LUNGS: clear to auscultation  and percussion with normal breathing effort HEART: regular rate & rhythm and no murmurs and no lower extremity edema ABDOMEN:abdomen soft, non-tender and normal bowel sounds Musculoskeletal:no cyanosis of digits and no clubbing  NEURO: alert & oriented x 3 with fluent speech, no focal motor/sensory deficits  LABORATORY DATA:  I have reviewed the data as listed CBC Latest Ref Rng & Units 09/18/2018 09/08/2018 09/04/2018  WBC 4.0 - 10.5 K/uL 5.6 39.5(H) 4.4  Hemoglobin 13.0 - 17.0 g/dL 12.9(L) 13.0 13.7  Hematocrit 39.0 - 52.0 % 39.1 38.8(L) 41.0  Platelets 150 - 400 K/uL 159 112(L) 141(L)     CMP Latest Ref Rng & Units 09/18/2018 09/08/2018 09/04/2018  Glucose 70 - 99 mg/dL 114(H) 102(H) 152(H)  BUN 6 - 20 mg/dL 11 12 10   Creatinine 0.61 - 1.24 mg/dL 0.78 0.77 0.83  Sodium 135 - 145 mmol/L 141 138 141  Potassium 3.5 - 5.1 mmol/L 4.1 4.1 3.9  Chloride 98 - 111 mmol/L 104 101 105  CO2 22 - 32 mmol/L 29 29 27   Calcium 8.9 - 10.3 mg/dL 9.3 9.0 9.2  Total Protein 6.5 - 8.1 g/dL 6.5 6.5 6.7  Total Bilirubin 0.3 - 1.2 mg/dL 0.3 0.3 0.4  Alkaline Phos 38 - 126 U/L 123 140(H) 125  AST 15 - 41 U/L 28 16 23   ALT 0 - 44 U/L 43 35 36     RADIOGRAPHIC STUDIES: I have personally reviewed the radiological images as listed and agreed with the findings in the report.  09/16/2018 MR MRA CHEST W WO CONTRAST  IMPRESSION: VASCULAR  1. 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 2. Left subclavian port catheter to the SVC. 3. SVC is patent  Mr Jodene Nam Chest W Wo Contrast  Result Date: 09/16/2018 CLINICAL DATA:  Op, hx pancreatic adenocarcinoma, new onset of left arm/scubclavian area swelling, eval poss dvt Proto per Dr Laurence Ferrari, gfr > 60 EXAM: MRA CHEST WITH OR WITHOUT CONTRAST TECHNIQUE: Angiographic images of the chest were obtained using MRA technique before and after intravenous contrast. CONTRAST:  8 mL Gadavist IV COMPARISON:  CT 07/02/2018  FINDINGS: VASCULAR Aorta: Mild dilatation of the ascending thoracic segment up to 4.4 cm diameter the arch and descending thoracic aorta is unremarkable. Patent 3 vessel brachiocephalic arterial origin anatomy without proximal stenosis. Heart:   Normal in size.  No pericardial effusion. Pulmonary Arteries: Unremarkable. Limited evaluation of segmental and subsegmental branches. Other: Left subclavian port catheter to the distal SVC. Left subclavian axillary vein and peripheral aspect of left subclavian vein patent. 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 SVC remains patent. NON-VASCULAR Spinal cord: Negative limited evaluation Brachial plexus: no mass or adenopathy. Muscles and tendons: Negative limited evaluation Bones: Negative limited evaluation Joints: Not evaluated IMPRESSION: VASCULAR 1. 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 2. Left subclavian port catheter to the SVC. 3. SVC is patent NON-VASCULAR 1. No acute findings. Electronically Signed   By: Lucrezia Europe M.D.   On: 09/16/2018 15:30     ASSESSMENT & PLAN:  Ernest Wu is a 58 y.o. male with history of   1. Adenocarcinoma of the pancreas,cT3N1M0, borderline resectable -Diagnosed in 06/2018. Treated with neoadjuvant chemo. Currently on FOLRIRNOX q2 weeks, started in 06/2018. Tolerating well overall, with mild cold sensitivity, fatigue, and constipation. Will continue neoadjuvant chemo, plan for a total of 4 months. -He is clinically doing well, exam was unremarkable, lab reviewed, CBC and CMP are unremarkable, adequate for treatment, will proceed to cycle 6 FOLFIRINOX today -Plan to repeat restaging scan after 8 cycles of treatment, and hopefully followed by surgery. -f/u in 2 weeks    2.Alcohol and smokingcessation -He has quit drinking alcohol, and is currently on folic acid. -He is still smoking half pack a day, I  strongly encouraged him to quit completely, so he can recover better  from surgery.  Agrees.  3.Left subclavian vein thrombosis in 09/2018 -Doppler was negative, we did MRI of venogram which showed left subclavicular vein thrombosis, I discussed the result with pt  -He has started Xarelto, tolerating well, will continue.  We reviewed the risk of bleeding.  4. Constipation -Secondary to chemo and premedications. -I previously encouraged him to take Miralax and Colase as needed and drink plenty of fluids.   Plan  -labs reviewed, good to proceed with NeoadjuvantFOLFIRINOX q2 weeks today and continue every 2 weeks for 2 more cycles -f/u in 2 weeks with labs  No problem-specific Assessment & Plan notes found for this encounter.   No orders of the defined types were placed in this encounter.  All questions were answered. The patient knows to call the clinic with any problems, questions or concerns. No barriers to learning was detected. I spent 20 minutes counseling the patient face to face. The total time spent in the appointment was 25 minutes and more than 50% was on counseling and review of test results  I, Manson Allan am acting as scribe for Dr. [name].  I have reviewed the above documentation for accuracy and completeness, and I agree with the above.     [name], MD 09/18/2018

## 2018-09-16 ENCOUNTER — Ambulatory Visit (HOSPITAL_COMMUNITY)
Admission: RE | Admit: 2018-09-16 | Discharge: 2018-09-16 | Disposition: A | Discharge status: Home / Self Care | Payer: Managed Care, Other (non HMO) | Source: Ambulatory Visit | Attending: Hematology | Admitting: Hematology

## 2018-09-16 DIAGNOSIS — M7989 Other specified soft tissue disorders: Secondary | ICD-10-CM | POA: Insufficient documentation

## 2018-09-16 MED ORDER — GADOBUTROL 1 MMOL/ML IV SOLN
8.0000 mL | Freq: Once | INTRAVENOUS | Status: AC | PRN
  Administered 2018-09-16: 8 mL via INTRAVENOUS

## 2018-09-17 ENCOUNTER — Telehealth: Payer: Self-pay

## 2018-09-17 NOTE — Telephone Encounter (Signed)
Spoke with patient's wife [name] regarding MRA scan, showed clot, continue blood thinner, we will see the patient tomorrow as scheduled, she verbalized an understanding.

## 2018-09-18 ENCOUNTER — Inpatient Hospital Stay: Payer: Managed Care, Other (non HMO)

## 2018-09-18 ENCOUNTER — Other Ambulatory Visit: Payer: Self-pay

## 2018-09-18 ENCOUNTER — Inpatient Hospital Stay (HOSPITAL_BASED_OUTPATIENT_CLINIC_OR_DEPARTMENT_OTHER): Payer: Managed Care, Other (non HMO) | Admitting: Hematology

## 2018-09-18 ENCOUNTER — Encounter: Payer: Self-pay | Admitting: Hematology

## 2018-09-18 VITALS — BP 124/80 | HR 62 | Temp 98.5°F | Resp 17 | Ht 72.0 in | Wt 202.5 lb

## 2018-09-18 DIAGNOSIS — C259 Malignant neoplasm of pancreas, unspecified: Secondary | ICD-10-CM

## 2018-09-18 DIAGNOSIS — Z791 Long term (current) use of non-steroidal anti-inflammatories (NSAID): Secondary | ICD-10-CM

## 2018-09-18 DIAGNOSIS — Z79899 Other long term (current) drug therapy: Secondary | ICD-10-CM

## 2018-09-18 DIAGNOSIS — R0602 Shortness of breath: Secondary | ICD-10-CM

## 2018-09-18 DIAGNOSIS — I82B12 Acute embolism and thrombosis of left subclavian vein: Secondary | ICD-10-CM

## 2018-09-18 DIAGNOSIS — R5383 Other fatigue: Secondary | ICD-10-CM

## 2018-09-18 DIAGNOSIS — C257 Malignant neoplasm of other parts of pancreas: Secondary | ICD-10-CM

## 2018-09-18 DIAGNOSIS — L814 Other melanin hyperpigmentation: Secondary | ICD-10-CM

## 2018-09-18 DIAGNOSIS — K5900 Constipation, unspecified: Secondary | ICD-10-CM | POA: Diagnosis not present

## 2018-09-18 DIAGNOSIS — I2510 Atherosclerotic heart disease of native coronary artery without angina pectoris: Secondary | ICD-10-CM

## 2018-09-18 DIAGNOSIS — Z95828 Presence of other vascular implants and grafts: Secondary | ICD-10-CM

## 2018-09-18 DIAGNOSIS — Z7901 Long term (current) use of anticoagulants: Secondary | ICD-10-CM

## 2018-09-18 DIAGNOSIS — F17210 Nicotine dependence, cigarettes, uncomplicated: Secondary | ICD-10-CM

## 2018-09-18 DIAGNOSIS — K219 Gastro-esophageal reflux disease without esophagitis: Secondary | ICD-10-CM

## 2018-09-18 DIAGNOSIS — I700 Atherosclerosis of aorta: Secondary | ICD-10-CM

## 2018-09-18 LAB — CMP (CANCER CENTER ONLY)
ALT: 43 U/L (ref 0–44)
AST: 28 U/L (ref 15–41)
Albumin: 3.7 g/dL (ref 3.5–5.0)
Alkaline Phosphatase: 123 U/L (ref 38–126)
Anion gap: 8 (ref 5–15)
BUN: 11 mg/dL (ref 6–20)
CO2: 29 mmol/L (ref 22–32)
Calcium: 9.3 mg/dL (ref 8.9–10.3)
Chloride: 104 mmol/L (ref 98–111)
Creatinine: 0.78 mg/dL (ref 0.61–1.24)
GFR, Estimated: >60 mL/min (ref >60)
Glucose, Bld: 114 mg/dL — ABNORMAL HIGH (ref 70–99)
Potassium: 4.1 mmol/L (ref 3.5–5.1)
Sodium: 141 mmol/L (ref 135–145)
Total Bilirubin: 0.3 mg/dL (ref 0.3–1.2)
Total Protein: 6.5 g/dL (ref 6.5–8.1)

## 2018-09-18 LAB — CBC WITH DIFFERENTIAL (CANCER CENTER ONLY)
Abs Immature Granulocytes: 0.02 10*3/uL (ref 0.00–0.07)
BASOS ABS: 0 10*3/uL (ref 0.0–0.1)
Basophils Relative: 0 %
Eosinophils Absolute: 0.2 10*3/uL (ref 0.0–0.5)
Eosinophils Relative: 3 %
HCT: 39.1 % (ref 39.0–52.0)
Hemoglobin: 12.9 g/dL — ABNORMAL LOW (ref 13.0–17.0)
Immature Granulocytes: 0 %
Lymphocytes Relative: 20 %
Lymphs Abs: 1.1 10*3/uL (ref 0.7–4.0)
MCH: 30.9 pg (ref 26.0–34.0)
MCHC: 33 g/dL (ref 30.0–36.0)
MCV: 93.8 fL (ref 80.0–100.0)
Monocytes Absolute: 0.5 10*3/uL (ref 0.1–1.0)
Monocytes Relative: 8 %
Neutro Abs: 3.9 10*3/uL (ref 1.7–7.7)
Neutrophils Relative %: 69 %
Platelet Count: 159 10*3/uL (ref 150–400)
RBC: 4.17 MIL/uL — ABNORMAL LOW (ref 4.22–5.81)
RDW: 15.8 % — ABNORMAL HIGH (ref 11.5–15.5)
WBC Count: 5.6 10*3/uL (ref 4.0–10.5)
nRBC: 0 % (ref 0.0–0.2)

## 2018-09-18 MED ORDER — PALONOSETRON HCL INJECTION 0.25 MG/5ML
INTRAVENOUS | Status: AC
  Filled 2018-09-18: qty 5

## 2018-09-18 MED ORDER — ATROPINE SULFATE 0.4 MG/ML IJ SOLN
0.4000 mg | Freq: Once | INTRAMUSCULAR | Status: AC | PRN
  Administered 2018-09-18: 0.4 mg via INTRAVENOUS

## 2018-09-18 MED ORDER — PALONOSETRON HCL INJECTION 0.25 MG/5ML
0.2500 mg | Freq: Once | INTRAVENOUS | Status: AC
  Administered 2018-09-18: 0.25 mg via INTRAVENOUS

## 2018-09-18 MED ORDER — SODIUM CHLORIDE 0.9 % IV SOLN
2390.0000 mg/m2 | INTRAVENOUS | Status: DC
  Administered 2018-09-18: 5000 mg via INTRAVENOUS
  Filled 2018-09-18: qty 100

## 2018-09-18 MED ORDER — DEXTROSE 5 % IV SOLN
Freq: Once | INTRAVENOUS | Status: AC
  Administered 2018-09-18: 09:00:00 via INTRAVENOUS
  Filled 2018-09-18: qty 250

## 2018-09-18 MED ORDER — ATROPINE SULFATE 1 MG/ML IJ SOLN: 0.5000 mg | Freq: Once | INTRAMUSCULAR | Status: DC | PRN

## 2018-09-18 MED ORDER — LEUCOVORIN CALCIUM INJECTION 350 MG
400.0000 mg/m2 | Freq: Once | INTRAVENOUS | Status: AC
  Administered 2018-09-18: 840 mg via INTRAVENOUS
  Filled 2018-09-18: qty 42

## 2018-09-18 MED ORDER — DEXTROSE 5 % IV SOLN
Freq: Once | INTRAVENOUS | Status: DC
  Filled 2018-09-18: qty 250

## 2018-09-18 MED ORDER — DEXAMETHASONE SODIUM PHOSPHATE 10 MG/ML IJ SOLN
INTRAMUSCULAR | Status: AC
  Filled 2018-09-18: qty 1

## 2018-09-18 MED ORDER — ATROPINE SULFATE 0.4 MG/ML IJ SOLN
INTRAMUSCULAR | Status: AC
  Filled 2018-09-18: qty 1

## 2018-09-18 MED ORDER — DEXAMETHASONE SODIUM PHOSPHATE 10 MG/ML IJ SOLN
10.0000 mg | Freq: Once | INTRAMUSCULAR | Status: AC
  Administered 2018-09-18: 10 mg via INTRAVENOUS

## 2018-09-18 MED ORDER — FAMOTIDINE 20 MG PO TABS
ORAL_TABLET | ORAL | Status: AC
  Filled 2018-09-18: qty 1

## 2018-09-18 MED ORDER — FAMOTIDINE 20 MG PO TABS
20.0000 mg | ORAL_TABLET | Freq: Once | ORAL | Status: AC
  Administered 2018-09-18: 20 mg via ORAL

## 2018-09-18 MED ORDER — IRINOTECAN HCL CHEMO INJECTION 100 MG/5ML
150.0000 mg/m2 | Freq: Once | INTRAVENOUS | Status: AC
  Administered 2018-09-18: 320 mg via INTRAVENOUS
  Filled 2018-09-18: qty 16

## 2018-09-18 MED ORDER — ATROPINE SULFATE 1 MG/ML IJ SOLN
INTRAMUSCULAR | Status: AC
  Filled 2018-09-18: qty 1

## 2018-09-18 MED ORDER — SODIUM CHLORIDE 0.9% FLUSH
10.0000 mL | INTRAVENOUS | Status: DC | PRN
  Filled 2018-09-18: qty 10

## 2018-09-18 MED ORDER — SODIUM CHLORIDE 0.9% FLUSH
10.0000 mL | Freq: Once | INTRAVENOUS | Status: AC
  Administered 2018-09-18: 10 mL
  Filled 2018-09-18: qty 10

## 2018-09-18 MED ORDER — OXALIPLATIN CHEMO INJECTION 100 MG/20ML
85.0000 mg/m2 | Freq: Once | INTRAVENOUS | Status: AC
  Administered 2018-09-18: 180 mg via INTRAVENOUS
  Filled 2018-09-18: qty 36

## 2018-09-18 NOTE — Progress Notes (Unsigned)
exium

## 2018-09-18 NOTE — Patient Instructions (Signed)
[facility] Discharge Instructions for Patients Receiving Chemotherapy  Today you received the following chemotherapy agents: Oxaliplatin (Eloxatin), Irinotecan (Camptosar), Leucovorin, Fluorouracil (Adrucil, 5-FU)  To help prevent nausea and vomiting after your treatment, we encourage you to take your nausea medication as directed. Received Aloxi during treatment today-->Take your Compazine (not Zofran) for the next 3 days as needed.    If you develop nausea and vomiting that is not controlled by your nausea medication, call the clinic.   BELOW ARE SYMPTOMS THAT SHOULD BE REPORTED IMMEDIATELY:  *FEVER GREATER THAN 100.5 F  *CHILLS WITH OR WITHOUT FEVER  NAUSEA AND VOMITING THAT IS NOT CONTROLLED WITH YOUR NAUSEA MEDICATION  *UNUSUAL SHORTNESS OF BREATH  *UNUSUAL BRUISING OR BLEEDING  TENDERNESS IN MOUTH AND THROAT WITH OR WITHOUT PRESENCE OF ULCERS  *URINARY PROBLEMS  *BOWEL PROBLEMS  UNUSUAL RASH Items with * indicate a potential emergency and should be followed up as soon as possible.  Feel free to call the clinic should you have any questions or concerns. The clinic phone number is [phone].  Please show the [facility] at check-in to the Emergency Department and triage nurse.

## 2018-09-19 ENCOUNTER — Telehealth: Payer: Self-pay | Admitting: Hematology

## 2018-09-19 LAB — CANCER ANTIGEN 19-9: CA 19-9: 20 U/mL (ref 0–35)

## 2018-09-19 NOTE — Telephone Encounter (Signed)
No los per 2/13.

## 2018-09-20 ENCOUNTER — Inpatient Hospital Stay: Payer: Managed Care, Other (non HMO)

## 2018-09-20 VITALS — BP 130/77 | HR 78 | Temp 97.8°F | Resp 20

## 2018-09-20 DIAGNOSIS — C257 Malignant neoplasm of other parts of pancreas: Secondary | ICD-10-CM | POA: Diagnosis not present

## 2018-09-20 DIAGNOSIS — C259 Malignant neoplasm of pancreas, unspecified: Secondary | ICD-10-CM

## 2018-09-20 MED ORDER — PEGFILGRASTIM-CBQV 6 MG/0.6ML ~~LOC~~ SOSY
6.0000 mg | PREFILLED_SYRINGE | Freq: Once | SUBCUTANEOUS | Status: AC
  Administered 2018-09-20: 6 mg via SUBCUTANEOUS

## 2018-09-20 MED ORDER — HEPARIN SOD (PORK) LOCK FLUSH 100 UNIT/ML IV SOLN
500.0000 [IU] | Freq: Once | INTRAVENOUS | Status: AC | PRN
  Administered 2018-09-20: 500 [IU]
  Filled 2018-09-20: qty 5

## 2018-09-20 MED ORDER — PEGFILGRASTIM-CBQV 6 MG/0.6ML ~~LOC~~ SOSY
PREFILLED_SYRINGE | SUBCUTANEOUS | Status: AC
  Filled 2018-09-20: qty 0.6

## 2018-09-20 MED ORDER — SODIUM CHLORIDE 0.9% FLUSH
10.0000 mL | INTRAVENOUS | Status: DC | PRN
  Administered 2018-09-20: 10 mL
  Filled 2018-09-20: qty 10

## 2018-09-30 NOTE — Progress Notes (Signed)
Ernest Wu   [phone] [phone]   Clinic Follow up Note   Patient Care Team: Street, [name], MD as PCP - General (Family Medicine) 10/02/2018  CHIEF COMPLAINT: F/u on pancreatic cancer   SUMMARY OF ONCOLOGIC HISTORY: Oncology History   Cancer Staging Adenocarcinoma of pancreas [facility]) Staging form: Exocrine Pancreas, AJCC 8th Edition - Clinical stage from 06/10/2018: Stage IB (cT2, cN0, cM0) - Signed by [name], MD on 06/29/2018       Adenocarcinoma of pancreas ([place])   06/07/2018 Imaging    CT AP w Contrast IMPRESSION: 1. There is a low attenuation mass centered around the neck of pancreas which is concerning for neoplasm. Pancreatic adenocarcinoma favored. This results in common bile duct obstruction with mild intrahepatic biliary ductal dilatation. There also is involvement of the portal venous confluence. Further evaluation with nonemergent contrast enhanced MRI of the pancreas is recommended. 2. Small indeterminate low-attenuation structure is noted within segment 4 a of the liver. This could be better addressed at MRI.    06/09/2018 Imaging    MR ABD MRCP  The pancreatic mass involves approximately 40 percent of the main portal vein circumference at the portal splenic venous confluence, with associated mild narrowing of the portal splenic venous confluence. The SMV, splenic vein and main, right and left portal veins remain patent. The celiac trunk and SMA are not involved by the pancreatic mass.  IMPRESSION: 2.3 cm diameter hypoenhancing mass at the junction of the head and neck of pancreas with pancreatic ductal dilatation, likely adenocarcinoma.  Intra and extrahepatic bile duct dilatation with abrupt change in caliber at the mid common bile duct. This could indicate an obstructing mass or stricture.     06/10/2018 Initial Biopsy    Diagnosis PANCREAS, FINE NEEDLE ASPIRATION (SPECIMEN 1 OF 1 COLLECTED 06/10/18): MALIGNANT  CELLS CONSISTENT WITH ADENOCARCINOMA.    06/10/2018 Procedure    IMPRESSION: 1. 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 2. Placement of a metallic biliary stent.    06/10/2018 Procedure    EUS per Dr. Paulita Fujita Impression:  - There was dilation in the common bile duct which measured up to 12 mm. - A mass was identified in the pancreatic head. This was staged T3 N1 Mx by endosonographic criteria. Fine needle aspiration performed. - A few lymph nodes were visualized and measured in the peripancreatic region. - There was no evidence of significant pathology in the left lobe of the liver.    06/10/2018 Cancer Staging    Staging form: Exocrine Pancreas, AJCC 8th Edition - Clinical stage from 06/10/2018: Stage IB (cT2, cN0, cM0) - Signed by [name], MD on 06/29/2018    06/27/2018 Initial Diagnosis    Adenocarcinoma of pancreas ([place])    07/02/2018 Imaging    IMPRESSION: 1. Multiple small pulmonary nodules scattered throughout the lungs measuring up to 7 mm in size. These are nonspecific, but the possibility of metastatic disease should be considered, and close attention at time of routine followups is recommended. 2. In addition, today's study demonstrates new and enlarging low-attenuation lesions in the liver. This is poorly evaluated on today's noncontrast CT examination, but is concerning for potential metastatic disease. Further evaluation with repeat nonemergent MRI of the abdomen with and without IV gadolinium is suggested in the near future to better evaluate these findings. 3. Aortic atherosclerosis, in addition to left main and 2 vessel coronary artery disease. Please note that although the  presence of coronary artery calcium documents the presence of coronary artery disease, the severity of this disease and any potential stenosis cannot be assessed on this non-gated CT examination. Assessment for potential risk factor  modification, dietary therapy or pharmacologic therapy may be warranted, if clinically indicated. 4. Mild aneurysmal dilatation of the ascending thoracic aorta (4.8 cm in diameter). Ascending thoracic aortic aneurysm. Recommend semi-annual imaging followup by CTA or MRA and referral to cardiothoracic surgery if not already obtained. This recommendation follows 2010 ACCF/AHA/AATS/ACR/ASA/SCA/SCAI/SIR/STS/SVM Guidelines for the Diagnosis and Management of Patients With Thoracic Aortic Disease. Circulation. 2010; 121: A250-N397.  Aortic Atherosclerosis (ICD10-I70.0). Aortic aneurysm NOS (ICD10-I71.9).    07/04/2018 -  Chemotherapy     FOLFIRINOX q2 weeks    07/15/2018 Imaging    07/15/2018 Liver US IMPRESSION: No liver lesions identified with ultrasound. Ultrasound-guided biopsy was not performed. Recommend further characterization for liver lesions with a repeat MRI, with and without contrast.    07/24/2018 Imaging    07/24/2018 MRI Abdomen IMPRESSION: 1. The hypoenhancing mass at the junction of the pancreatic body and head has reduced in size, previously 3.2 by 2.8 cm and currently 2.9 by 2.2 cm. A small peripancreatic lymph node adjacent to the mass was previously 0.9 cm in short axis and is currently 0.7 cm in short axis. The amount of contact between the pancreatic mass and the confluence of the splenic vein and SMV is similar to the prior exam. 2. There is a 6 mm probable hemangioma in segment 4a of the liver, based on the delayed enhancement pattern. This is somewhat ill-defined. The lesion appeared larger on the prior noncontrast CT but presumably may have been overestimated on that exam. There is also some hypodensity along the dome of the right hepatic lobe which appears to most likely be due to a slip of the diaphragm rather than a discernible lesion on MRI. 3. Aortic Atherosclerosis (ICD10-I70.0). Notably, there is a small amount of mural thrombus along the right  side of the abdominal aorta below the right renal artery level which has not been seen previously.    09/03/2018 Imaging    09/03/2018 MRI Abdomen IMPRESSION: 1. Stable mass (adenocarcinoma) in the neck of the pancreas with upstream duct dilatation. 2. No evidence of lymphadenopathy in the porta hepatis or peripancreatic fat. 3. No evidence hepatic metastasis. 4. Mild biliary duct dilatation LEFT hepatic lobe similar to comparison exam. Biliary stent within the common bile duct.    09/16/2018 Imaging    MR MRA CHEST W WO CONTRAST  IMPRESSION: VASCULAR  1. 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 2. Left subclavian port catheter to the SVC. 3. SVC is patent       CURRENT THERAPY  NeoadjuvantFOLFIRINOX q2 weeks, started on 07/05/2018  INTERVAL HISTORY: Ernest Wu is a 58 y.o. male who is here for follow-up. Today, he is here with his wife. He still has some mild swelling on his neck due to his left subclavian vein thrombosis but it is improving and mild hyperpigmentation on his left arm. Last chemotherapy he vomited for the first time and it takes him about 3 days to improve after chemotherapy. Last week he noted "whitish water pockets" in his stool but any denies blood. He has cold sensitivity on his fingers and a mild intermittent cough. Denies any stomach pain or cp.    Pertinent positives and negatives of review of systems are listed and detailed within the above HPI.  REVIEW OF SYSTEMS:  . Constitutional: Denies fevers, chills or abnormal weight loss, (+) mild intermittent cough  Eyes: Denies blurriness of vision Ears, nose, mouth, throat, and face: Denies mucositis or sore throat, (+) mild neck swelling  Respiratory: Denies cough, dyspnea or wheezes Cardiovascular: Denies palpitation, chest discomfort or lower extremity swelling Gastrointestinal:  Denies nausea, heartburn or change in bowel habits Skin: Denies  abnormal skin rashes Lymphatics: Denies new lymphadenopathy or easy bruising Neurological:Denies numbness, tingling or new weaknesses, (+) cold sensation on fingers  Behavioral/Psych: Mood is stable, no new changes  All other systems were reviewed with the patient and are negative.  MEDICAL HISTORY:  Past Medical History:  Diagnosis Date  . Cancer ([place]) 06/2018   Pancreatic  . GERD (gastroesophageal reflux disease)     SURGICAL HISTORY: Past Surgical History:  Procedure Laterality Date  . BILIARY STENT PLACEMENT  06/10/2018   Procedure: BILIARY STENT PLACEMENT;  Surgeon: Ronnette Juniper, MD;  Location: [facility] ENDOSCOPY;  Service: Gastroenterology;;  . ERCP N/A 06/10/2018   Procedure: ENDOSCOPIC RETROGRADE CHOLANGIOPANCREATOGRAPHY (ERCP);  Surgeon: Ronnette Juniper, MD;  Location: [place];  Service: Gastroenterology;  Laterality: N/A;  . ESOPHAGOGASTRODUODENOSCOPY (EGD) WITH PROPOFOL N/A 06/10/2018   Procedure: ESOPHAGOGASTRODUODENOSCOPY (EGD) WITH PROPOFOL;  Surgeon: Ronnette Juniper, MD;  Location: [place];  Service: Gastroenterology;  Laterality: N/A;  . FINE NEEDLE ASPIRATION  06/10/2018   Procedure: FINE NEEDLE ASPIRATION (FNA) LINEAR;  Surgeon: Ronnette Juniper, MD;  Location: [facility] ENDOSCOPY;  Service: Gastroenterology;;  . Sol Passer PLACEMENT N/A 07/02/2018   Procedure: INSERTION PORT-A-CATH;  Surgeon: Stark Klein, MD;  Location: [place];  Service: General;  Laterality: N/A;  . SPHINCTEROTOMY  06/10/2018   Procedure: SPHINCTEROTOMY;  Surgeon: Ronnette Juniper, MD;  Location: [facility] ENDOSCOPY;  Service: Gastroenterology;;  . UPPER ESOPHAGEAL ENDOSCOPIC ULTRASOUND (EUS) N/A 06/10/2018   Procedure: UPPER ESOPHAGEAL ENDOSCOPIC ULTRASOUND (EUS);  Surgeon: Ronnette Juniper, MD;  Location: [place];  Service: Gastroenterology;  Laterality: N/A;  . VASECTOMY      I have reviewed the social history and family history with the patient and they are unchanged from previous note.  ALLERGIES:  is allergic to contrast  media [iodinated diagnostic agents] and iohexol.  MEDICATIONS:  Current Outpatient Medications  Medication Sig Dispense Refill  . ALPRAZolam (XANAX) 0.25 MG tablet Take 1 tablet (0.25 mg total) by mouth at bedtime as needed for anxiety. 30 tablet 0  . diphenoxylate-atropine (LOMOTIL) 2.5-0.025 MG tablet Take 2 tablets by mouth 4 (four) times daily as needed for diarrhea or loose stools. 40 tablet 0  . esomeprazole (NEXIUM) 20 MG capsule Take 20 mg by mouth daily.     . folic acid (FOLVITE) 1 MG tablet Take 1 mg by mouth daily.    Marland Kitchen ibuprofen (ADVIL,MOTRIN) 200 MG tablet Take 400 mg by mouth daily as needed for headache or moderate pain.     Marland Kitchen lidocaine-prilocaine (EMLA) cream Apply to affected area once 30 g 3  . LORazepam (ATIVAN) 1 MG tablet Take 1 tab 45 mins before MRI, if not enough, OK to take second tab right before MRI    . magic mouthwash w/lidocaine SOLN Take 5 mLs by mouth 4 (four) times daily as needed for mouth pain. 240 mL 2  . ondansetron (ZOFRAN) 8 MG tablet Take 1 tablet (8 mg total) by mouth 2 (two) times daily as needed for refractory nausea / vomiting. Start on day 3 after chemotherapy. 30 tablet 1  . oxyCODONE (OXY IR/ROXICODONE) 5 MG immediate release tablet Take 1  tablet (5 mg total) by mouth every 6 (six) hours as needed for severe pain. 20 tablet 0  . prochlorperazine (COMPAZINE) 10 MG tablet Take 1 tablet (10 mg total) by mouth every 6 (six) hours as needed (NAUSEA). 30 tablet 1  . simethicone (MYLICON) 702 MG chewable tablet Chew 125 mg by mouth every 6 (six) hours as needed for flatulence.    . rivaroxaban (XARELTO) 20 MG TABS tablet Take 1 tablet (20 mg total) by mouth daily with supper. 30 tablet 2   No current facility-administered medications for this visit.    Facility-Administered Medications Ordered in Other Visits  Medication Dose Route Frequency Provider Last Rate Last Dose  . heparin lock flush 100 unit/mL  500 Units Intracatheter Once PRN [name], MD       . sodium chloride flush (NS) 0.9 % injection 10 mL  10 mL Intracatheter PRN [name], MD        PHYSICAL EXAMINATION: ECOG PERFORMANCE STATUS: 1 - Symptomatic but completely ambulatory  Vitals:   10/02/18 0808  BP: 133/89  Pulse: 65  Resp: 18  Temp: (!) 97.5 F (36.4 C)  SpO2: 98%   Filed Weights   10/02/18 0808  Weight: 202 lb 1.6 oz (91.7 kg)    GENERAL:alert, no distress and comfortable SKIN: skin color, texture, turgor are normal, no rashes or significant lesions, (+) hyperpigmentation on left arm  EYES: normal, Conjunctiva are pink and non-injected, sclera clear OROPHARYNX:no exudate, no erythema and lips, buccal mucosa, and tongue normal  NECK: supple, thyroid normal size, non-tender, without nodularity, (+) mild left subclavian vein swelling  LYMPH:  no palpable lymphadenopathy in the cervical, axillary or inguinal LUNGS: clear to auscultation and percussion with normal breathing effort HEART: regular rate & rhythm and no murmurs and no lower extremity edema ABDOMEN:abdomen soft, non-tender and normal bowel sounds Musculoskeletal:no cyanosis of digits and no clubbing  NEURO: alert & oriented x 3 with fluent speech, no focal motor/sensory deficits  LABORATORY DATA:  I have reviewed the data as listed CBC Latest Ref Rng & Units 10/02/2018 09/18/2018 09/08/2018  WBC 4.0 - 10.5 K/uL 6.1 5.6 39.5(H)  Hemoglobin 13.0 - 17.0 g/dL 12.9(L) 12.9(L) 13.0  Hematocrit 39.0 - 52.0 % 39.8 39.1 38.8(L)  Platelets 150 - 400 K/uL 123(L) 159 112(L)     CMP Latest Ref Rng & Units 10/02/2018 09/18/2018 09/08/2018  Glucose 70 - 99 mg/dL 137(H) 114(H) 102(H)  BUN 6 - 20 mg/dL 6 11 12   Creatinine 0.61 - 1.24 mg/dL 0.80 0.78 0.77  Sodium 135 - 145 mmol/L 143 141 138  Potassium 3.5 - 5.1 mmol/L 3.9 4.1 4.1  Chloride 98 - 111 mmol/L 107 104 101  CO2 22 - 32 mmol/L 25 29 29   Calcium 8.9 - 10.3 mg/dL 8.9 9.3 9.0  Total Protein 6.5 - 8.1 g/dL 6.5 6.5 6.5  Total Bilirubin 0.3 - 1.2 mg/dL 0.3  0.3 0.3  Alkaline Phos 38 - 126 U/L 151(H) 123 140(H)  AST 15 - 41 U/L 30 28 16   ALT 0 - 44 U/L 45(H) 43 35      RADIOGRAPHIC STUDIES: I have personally reviewed the radiological images as listed and agreed with the findings in the report. No results found.   ASSESSMENT & PLAN:  Ernest Wu is a 58 y.o. male with history of   1. Adenocarcinoma of the pancreas,cT3N1M0, borderline resectable -Diagnosed in 06/2018. Treated with neoadjuvant chemo. Currently on FOLRIRNOX q2 weeks, started in 06/2018. Toleratingwell overall,with mild  cold sensitivity, fatigue, and constipation. Will continueneoadjuvant chemo, plan for a total of 4 months (8 cycles). -He has recovered well from last cycle chemotherapy, lab reviewed, mild thrombocytopenia secondary to chemo, otherwise unremarkable CBC and CMP, will proceed to cycle 7 FOLFIRINOX today -Plan to repeat restaging MRI scan after 8 cycles of treatment, and hopefully followed by surgery. -I will send a message to Dr. Barry Dienes for his follow-up appointment with her  2.Alcohol and smokingcessation -He has quit drinking alcohol, and is currently on folic acid. -He is still smoking half pack a day, I previously strongly encouraged him to quit completely  3.Left subclavian vein thrombosis in 09/2018 -Doppler was negative, we did MRI of venogram which showed left subclavicular vein thrombosis - We previously reviewed his risk of bleeding.  -He is on Xarelto 20 mg daily now, tolerating well.  His left arm and [place] swelling has much improved   4. Constipation -Secondary to chemo and premedications. -I previously encouraged him to take Miralax and Colase as needed and drink plenty of fluids.   Plan  - I refilled Xarelto 20 mg tablets  - I will contact Dr. Barry Dienes for his f/u appointment  -labs reviewed, good to proceed with NeoadjuvantFOLFIRINOX cycle 7today  -lab, flush, f/u and last cycle chemo in 2 weeks  -Abdominal MRI w contrast  around 3/20  -Lab, flush and f/u on 3/27     No problem-specific Assessment & Plan notes found for this encounter.   No orders of the defined types were placed in this encounter.  All questions were answered. The patient knows to call the clinic with any problems, questions or concerns. No barriers to learning was detected. I spent 20 minutes counseling the patient face to face. The total time spent in the appointment was 25 minutes and more than 50% was on counseling and review of test results  I, Manson Allan am acting as scribe for Dr. [name].  I have reviewed the above documentation for accuracy and completeness, and I agree with the above.     [name], MD 10/02/2018

## 2018-10-02 ENCOUNTER — Inpatient Hospital Stay: Payer: Managed Care, Other (non HMO)

## 2018-10-02 ENCOUNTER — Telehealth: Payer: Self-pay | Admitting: Hematology

## 2018-10-02 ENCOUNTER — Encounter: Payer: Self-pay | Admitting: Hematology

## 2018-10-02 ENCOUNTER — Inpatient Hospital Stay (HOSPITAL_BASED_OUTPATIENT_CLINIC_OR_DEPARTMENT_OTHER): Payer: Managed Care, Other (non HMO) | Admitting: Hematology

## 2018-10-02 ENCOUNTER — Other Ambulatory Visit: Payer: Self-pay

## 2018-10-02 VITALS — BP 133/89 | HR 65 | Temp 97.5°F | Resp 18 | Ht 72.0 in | Wt 202.1 lb

## 2018-10-02 DIAGNOSIS — F17210 Nicotine dependence, cigarettes, uncomplicated: Secondary | ICD-10-CM

## 2018-10-02 DIAGNOSIS — Z79899 Other long term (current) drug therapy: Secondary | ICD-10-CM

## 2018-10-02 DIAGNOSIS — I82B12 Acute embolism and thrombosis of left subclavian vein: Secondary | ICD-10-CM

## 2018-10-02 DIAGNOSIS — Z791 Long term (current) use of non-steroidal anti-inflammatories (NSAID): Secondary | ICD-10-CM

## 2018-10-02 DIAGNOSIS — C259 Malignant neoplasm of pancreas, unspecified: Secondary | ICD-10-CM

## 2018-10-02 DIAGNOSIS — R05 Cough: Secondary | ICD-10-CM

## 2018-10-02 DIAGNOSIS — R5383 Other fatigue: Secondary | ICD-10-CM

## 2018-10-02 DIAGNOSIS — R600 Localized edema: Secondary | ICD-10-CM

## 2018-10-02 DIAGNOSIS — K5900 Constipation, unspecified: Secondary | ICD-10-CM

## 2018-10-02 DIAGNOSIS — C257 Malignant neoplasm of other parts of pancreas: Secondary | ICD-10-CM | POA: Diagnosis not present

## 2018-10-02 DIAGNOSIS — I700 Atherosclerosis of aorta: Secondary | ICD-10-CM

## 2018-10-02 DIAGNOSIS — Z95828 Presence of other vascular implants and grafts: Secondary | ICD-10-CM

## 2018-10-02 DIAGNOSIS — K219 Gastro-esophageal reflux disease without esophagitis: Secondary | ICD-10-CM

## 2018-10-02 DIAGNOSIS — I2510 Atherosclerotic heart disease of native coronary artery without angina pectoris: Secondary | ICD-10-CM

## 2018-10-02 DIAGNOSIS — Z7901 Long term (current) use of anticoagulants: Secondary | ICD-10-CM

## 2018-10-02 LAB — CMP (CANCER CENTER ONLY)
ALT: 45 U/L — ABNORMAL HIGH (ref 0–44)
AST: 30 U/L (ref 15–41)
Albumin: 3.7 g/dL (ref 3.5–5.0)
Alkaline Phosphatase: 151 U/L — ABNORMAL HIGH (ref 38–126)
Anion gap: 11 (ref 5–15)
BUN: 6 mg/dL (ref 6–20)
CO2: 25 mmol/L (ref 22–32)
CREATININE: 0.8 mg/dL (ref 0.61–1.24)
Calcium: 8.9 mg/dL (ref 8.9–10.3)
Chloride: 107 mmol/L (ref 98–111)
GFR, Est AFR Am: >60 mL/min (ref >60)
GFR, Estimated: >60 mL/min (ref >60)
Glucose, Bld: 137 mg/dL — ABNORMAL HIGH (ref 70–99)
Potassium: 3.9 mmol/L (ref 3.5–5.1)
Sodium: 143 mmol/L (ref 135–145)
Total Bilirubin: 0.3 mg/dL (ref 0.3–1.2)
Total Protein: 6.5 g/dL (ref 6.5–8.1)

## 2018-10-02 LAB — CBC WITH DIFFERENTIAL (CANCER CENTER ONLY)
Abs Immature Granulocytes: 0.04 10*3/uL (ref 0.00–0.07)
Basophils Absolute: 0 10*3/uL (ref 0.0–0.1)
Basophils Relative: 1 %
Eosinophils Absolute: 0.2 10*3/uL (ref 0.0–0.5)
Eosinophils Relative: 3 %
HCT: 39.8 % (ref 39.0–52.0)
HEMOGLOBIN: 12.9 g/dL — AB (ref 13.0–17.0)
Immature Granulocytes: 1 %
LYMPHS ABS: 1.2 10*3/uL (ref 0.7–4.0)
LYMPHS PCT: 19 %
MCH: 30.9 pg (ref 26.0–34.0)
MCHC: 32.4 g/dL (ref 30.0–36.0)
MCV: 95.2 fL (ref 80.0–100.0)
MONOS PCT: 9 %
Monocytes Absolute: 0.5 10*3/uL (ref 0.1–1.0)
Neutro Abs: 4.1 10*3/uL (ref 1.7–7.7)
Neutrophils Relative %: 67 %
Platelet Count: 123 10*3/uL — ABNORMAL LOW (ref 150–400)
RBC: 4.18 MIL/uL — ABNORMAL LOW (ref 4.22–5.81)
RDW: 16.2 % — ABNORMAL HIGH (ref 11.5–15.5)
WBC Count: 6.1 10*3/uL (ref 4.0–10.5)
nRBC: 0 % (ref 0.0–0.2)

## 2018-10-02 MED ORDER — ATROPINE SULFATE 1 MG/ML IJ SOLN: 0.5000 mg | Freq: Once | INTRAMUSCULAR | Status: DC

## 2018-10-02 MED ORDER — DEXAMETHASONE SODIUM PHOSPHATE 10 MG/ML IJ SOLN
INTRAMUSCULAR | Status: AC
  Filled 2018-10-02: qty 1

## 2018-10-02 MED ORDER — LEUCOVORIN CALCIUM INJECTION 350 MG
400.0000 mg/m2 | Freq: Once | INTRAVENOUS | Status: AC
  Administered 2018-10-02: 840 mg via INTRAVENOUS
  Filled 2018-10-02: qty 42

## 2018-10-02 MED ORDER — ATROPINE SULFATE 0.4 MG/ML IJ SOLN
INTRAMUSCULAR | Status: AC
  Filled 2018-10-02: qty 1

## 2018-10-02 MED ORDER — OXALIPLATIN CHEMO INJECTION 100 MG/20ML
85.0000 mg/m2 | Freq: Once | INTRAVENOUS | Status: AC
  Administered 2018-10-02: 180 mg via INTRAVENOUS
  Filled 2018-10-02: qty 36

## 2018-10-02 MED ORDER — ATROPINE SULFATE 1 MG/ML IJ SOLN
0.4000 mg | Freq: Once | INTRAMUSCULAR | Status: AC
  Administered 2018-10-02: 0.4 mg via INTRAVENOUS

## 2018-10-02 MED ORDER — SODIUM CHLORIDE 0.9% FLUSH
10.0000 mL | Freq: Once | INTRAVENOUS | Status: AC
  Administered 2018-10-02: 10 mL
  Filled 2018-10-02: qty 10

## 2018-10-02 MED ORDER — DEXTROSE 5 % IV SOLN
Freq: Once | INTRAVENOUS | Status: AC
  Administered 2018-10-02: 09:00:00 via INTRAVENOUS
  Filled 2018-10-02: qty 250

## 2018-10-02 MED ORDER — RIVAROXABAN 20 MG PO TABS: 20.0000 mg | ORAL_TABLET | Freq: Every day | ORAL | 2 refills | Status: DC

## 2018-10-02 MED ORDER — PALONOSETRON HCL INJECTION 0.25 MG/5ML
0.2500 mg | Freq: Once | INTRAVENOUS | Status: AC
  Administered 2018-10-02: 0.25 mg via INTRAVENOUS

## 2018-10-02 MED ORDER — DEXAMETHASONE SODIUM PHOSPHATE 10 MG/ML IJ SOLN
10.0000 mg | Freq: Once | INTRAMUSCULAR | Status: AC
  Administered 2018-10-02: 10 mg via INTRAVENOUS

## 2018-10-02 MED ORDER — PALONOSETRON HCL INJECTION 0.25 MG/5ML
INTRAVENOUS | Status: AC
  Filled 2018-10-02: qty 5

## 2018-10-02 MED ORDER — SODIUM CHLORIDE 0.9 % IV SOLN
2390.0000 mg/m2 | INTRAVENOUS | Status: DC
  Administered 2018-10-02: 5000 mg via INTRAVENOUS
  Filled 2018-10-02: qty 100

## 2018-10-02 MED ORDER — IRINOTECAN HCL CHEMO INJECTION 100 MG/5ML
150.0000 mg/m2 | Freq: Once | INTRAVENOUS | Status: AC
  Administered 2018-10-02: 320 mg via INTRAVENOUS
  Filled 2018-10-02: qty 15

## 2018-10-02 NOTE — Patient Instructions (Signed)
[facility] Discharge Instructions for Patients Receiving Chemotherapy  Today you received the following chemotherapy agents Oxaliplatin, Irinotecan, Leucovorin, and 5FU  To help prevent nausea and vomiting after your treatment, we encourage you to take your nausea medication as directed   If you develop nausea and vomiting that is not controlled by your nausea medication, call the clinic.   BELOW ARE SYMPTOMS THAT SHOULD BE REPORTED IMMEDIATELY:  *FEVER GREATER THAN 100.5 F  *CHILLS WITH OR WITHOUT FEVER  NAUSEA AND VOMITING THAT IS NOT CONTROLLED WITH YOUR NAUSEA MEDICATION  *UNUSUAL SHORTNESS OF BREATH  *UNUSUAL BRUISING OR BLEEDING  TENDERNESS IN MOUTH AND THROAT WITH OR WITHOUT PRESENCE OF ULCERS  *URINARY PROBLEMS  *BOWEL PROBLEMS  UNUSUAL RASH Items with * indicate a potential emergency and should be followed up as soon as possible.  Feel free to call the clinic should you have any questions or concerns. The clinic phone number is [phone].  Please show the CHEMO ALERT CARD at check-in to the Emergency Department and triage nurse.   

## 2018-10-02 NOTE — Progress Notes (Signed)
When preparing to administer atropine, noted no blood return from [facility] and resistance with flush attempt. Patient denied discomfort. States, "They've been having trouble with my port since I started treatments." Patient repositioned self. Reassessed port with brisk blood return and easy flush. Patient continued to deny pain or any other sensation.   According to imaging study dated 07/02/2018:  "FINDINGS: 1326 hours. Left subclavian Port-A-Cath extends to the level of the lower SVC. The catheter is slightly kinked where it passes under the clavicle. The heart size is normal. The lungs are clear. There is no pleural effusion or pneumothorax.  IMPRESSION: Port-A-Cath tip at the level of the lower SVC.  No pneumothorax.   Electronically Signed   By: Richardean Sale M.D.   On: 07/02/2018 13:37"  Discussed situation with Dr. Burr Medico. Dr. Burr Medico advised to continue to use [facility] for the remainder of the treatment and to continue with 5FU pump as long as patient has no discomfort. Dr. Burr Medico will arrange for patient to have dye study soon. Mr. Offerdahl and his wife were notified of plan of care and appt time for dye study was provided to them at time of discharge (10/08/2018 @ 1000). Urged patient to call clinic immediately if he develops pain/discomfort/abnormal sensation at or around [facility] site. Mr. Dibiasio and his wife were able to verbalize instructions and understanding of urgency if problems arise.

## 2018-10-02 NOTE — Telephone Encounter (Signed)
Gave avs and calendar ° °

## 2018-10-03 ENCOUNTER — Telehealth: Payer: Self-pay

## 2018-10-03 LAB — CANCER ANTIGEN 19-9: CAN 19-9: 30 U/mL (ref 0–35)

## 2018-10-03 NOTE — Telephone Encounter (Signed)
Spoke with patient per Dr. Burr Medico will cancel dye study due to his allergy to IV contrast. Dr. Burr Medico thinks it is okay to use the port for his last treatment.  After finished we will schedule to have removed sometime after final treatment.  Patient verbalized an understanding. Appointment was cancelled.

## 2018-10-04 ENCOUNTER — Inpatient Hospital Stay: Payer: Managed Care, Other (non HMO)

## 2018-10-04 VITALS — BP 161/86 | HR 70 | Temp 98.5°F | Resp 16

## 2018-10-04 DIAGNOSIS — C257 Malignant neoplasm of other parts of pancreas: Secondary | ICD-10-CM | POA: Diagnosis not present

## 2018-10-04 DIAGNOSIS — C259 Malignant neoplasm of pancreas, unspecified: Secondary | ICD-10-CM

## 2018-10-04 MED ORDER — PEGFILGRASTIM-CBQV 6 MG/0.6ML ~~LOC~~ SOSY
PREFILLED_SYRINGE | SUBCUTANEOUS | Status: AC
  Filled 2018-10-04: qty 0.6

## 2018-10-04 MED ORDER — SODIUM CHLORIDE 0.9% FLUSH
10.0000 mL | INTRAVENOUS | Status: DC | PRN
  Administered 2018-10-04: 10 mL
  Filled 2018-10-04: qty 10

## 2018-10-04 MED ORDER — PEGFILGRASTIM-CBQV 6 MG/0.6ML ~~LOC~~ SOSY
6.0000 mg | PREFILLED_SYRINGE | Freq: Once | SUBCUTANEOUS | Status: AC
  Administered 2018-10-04: 6 mg via SUBCUTANEOUS

## 2018-10-04 MED ORDER — HEPARIN SOD (PORK) LOCK FLUSH 100 UNIT/ML IV SOLN
500.0000 [IU] | Freq: Once | INTRAVENOUS | Status: AC | PRN
  Administered 2018-10-04: 500 [IU]
  Filled 2018-10-04: qty 5

## 2018-10-08 ENCOUNTER — Other Ambulatory Visit (HOSPITAL_COMMUNITY): Payer: Managed Care, Other (non HMO)

## 2018-10-14 NOTE — Progress Notes (Signed)
[place]   [phone] [phone]) [phone]   Clinic Follow up Note   Patient Care Team: Street, [name], MD as PCP - General (Family Medicine) 10/16/2018  CHIEF COMPLAINT: F/u pancreatic cancer  SUMMARY OF ONCOLOGIC HISTORY: Oncology History   Cancer Staging Adenocarcinoma of pancreas [facility]) Staging form: Exocrine Pancreas, AJCC 8th Edition - Clinical stage from 06/10/2018: Stage IB (cT2, cN0, cM0) - Signed by [name], MD on 06/29/2018       Adenocarcinoma of pancreas ([place])   06/07/2018 Imaging    CT AP w Contrast IMPRESSION: 1. There is a low attenuation mass centered around the neck of pancreas which is concerning for neoplasm. Pancreatic adenocarcinoma favored. This results in common bile duct obstruction with mild intrahepatic biliary ductal dilatation. There also is involvement of the portal venous confluence. Further evaluation with nonemergent contrast enhanced MRI of the pancreas is recommended. 2. Small indeterminate low-attenuation structure is noted within segment 4 a of the liver. This could be better addressed at MRI.    06/09/2018 Imaging    MR ABD MRCP  The pancreatic mass involves approximately 40 percent of the main portal vein circumference at the portal splenic venous confluence, with associated mild narrowing of the portal splenic venous confluence. The SMV, splenic vein and main, right and left portal veins remain patent. The celiac trunk and SMA are not involved by the pancreatic mass.  IMPRESSION: 2.3 cm diameter hypoenhancing mass at the junction of the head and neck of pancreas with pancreatic ductal dilatation, likely adenocarcinoma.  Intra and extrahepatic bile duct dilatation with abrupt change in caliber at the mid common bile duct. This could indicate an obstructing mass or stricture.     06/10/2018 Initial Biopsy    Diagnosis PANCREAS, FINE NEEDLE ASPIRATION (SPECIMEN 1 OF 1 COLLECTED 06/10/18): MALIGNANT CELLS  CONSISTENT WITH ADENOCARCINOMA.    06/10/2018 Procedure    IMPRESSION: 1. 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 2. Placement of a metallic biliary stent.    06/10/2018 Procedure    EUS per Dr. Paulita Fujita Impression:  - There was dilation in the common bile duct which measured up to 12 mm. - A mass was identified in the pancreatic head. This was staged T3 N1 Mx by endosonographic criteria. Fine needle aspiration performed. - A few lymph nodes were visualized and measured in the peripancreatic region. - There was no evidence of significant pathology in the left lobe of the liver.    06/10/2018 Cancer Staging    Staging form: Exocrine Pancreas, AJCC 8th Edition - Clinical stage from 06/10/2018: Stage IB (cT2, cN0, cM0) - Signed by [name], MD on 06/29/2018    06/27/2018 Initial Diagnosis    Adenocarcinoma of pancreas ([place])    07/02/2018 Imaging    IMPRESSION: 1. Multiple small pulmonary nodules scattered throughout the lungs measuring up to 7 mm in size. These are nonspecific, but the possibility of metastatic disease should be considered, and close attention at time of routine followups is recommended. 2. In addition, today's study demonstrates new and enlarging low-attenuation lesions in the liver. This is poorly evaluated on today's noncontrast CT examination, but is concerning for potential metastatic disease. Further evaluation with repeat nonemergent MRI of the abdomen with and without IV gadolinium is suggested in the near future to better evaluate these findings. 3. Aortic atherosclerosis, in addition to left main and 2 vessel coronary artery disease. Please note that although the presence of  coronary artery calcium documents the presence of coronary artery disease, the severity of this disease and any potential stenosis cannot be assessed on this non-gated CT examination. Assessment for potential risk factor modification,  dietary therapy or pharmacologic therapy may be warranted, if clinically indicated. 4. Mild aneurysmal dilatation of the ascending thoracic aorta (4.8 cm in diameter). Ascending thoracic aortic aneurysm. Recommend semi-annual imaging followup by CTA or MRA and referral to cardiothoracic surgery if not already obtained. This recommendation follows 2010 ACCF/AHA/AATS/ACR/ASA/SCA/SCAI/SIR/STS/SVM Guidelines for the Diagnosis and Management of Patients With Thoracic Aortic Disease. Circulation. 2010; 121: A193-X902.  Aortic Atherosclerosis (ICD10-I70.0). Aortic aneurysm NOS (ICD10-I71.9).    07/04/2018 -  Chemotherapy     FOLFIRINOX q2 weeks    07/15/2018 Imaging    07/15/2018 Liver US IMPRESSION: No liver lesions identified with ultrasound. Ultrasound-guided biopsy was not performed. Recommend further characterization for liver lesions with a repeat MRI, with and without contrast.    07/24/2018 Imaging    07/24/2018 MRI Abdomen IMPRESSION: 1. The hypoenhancing mass at the junction of the pancreatic body and head has reduced in size, previously 3.2 by 2.8 cm and currently 2.9 by 2.2 cm. A small peripancreatic lymph node adjacent to the mass was previously 0.9 cm in short axis and is currently 0.7 cm in short axis. The amount of contact between the pancreatic mass and the confluence of the splenic vein and SMV is similar to the prior exam. 2. There is a 6 mm probable hemangioma in segment 4a of the liver, based on the delayed enhancement pattern. This is somewhat ill-defined. The lesion appeared larger on the prior noncontrast CT but presumably may have been overestimated on that exam. There is also some hypodensity along the dome of the right hepatic lobe which appears to most likely be due to a slip of the diaphragm rather than a discernible lesion on MRI. 3. Aortic Atherosclerosis (ICD10-I70.0). Notably, there is a small amount of mural thrombus along the right side of the  abdominal aorta below the right renal artery level which has not been seen previously.    09/03/2018 Imaging    09/03/2018 MRI Abdomen IMPRESSION: 1. Stable mass (adenocarcinoma) in the neck of the pancreas with upstream duct dilatation. 2. No evidence of lymphadenopathy in the porta hepatis or peripancreatic fat. 3. No evidence hepatic metastasis. 4. Mild biliary duct dilatation LEFT hepatic lobe similar to comparison exam. Biliary stent within the common bile duct.    09/16/2018 Imaging    MR MRA CHEST W WO CONTRAST  IMPRESSION: VASCULAR  1. 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 2. Left subclavian port catheter to the SVC. 3. SVC is patent       CURRENT THERAPY  NeoadjuvantFOLFIRINOX q2 weeks, started on 07/05/2018   INTERVAL HISTORY: Ernest Wu is a 58 y.o. male who is here for follow-up. Today, he is here with his wife.  He is doing well overall, tolerating chemotherapy well.  His port site has no tenderness or skin change.  He has mild fatigue, but still works full-time.  No fever, chills, or bleeding. Weight is stable.  He still has mild cold sensitivity, no other neuropathy.   Pertinent positives and negatives of review of systems are listed and detailed within the above HPI.  REVIEW OF SYSTEMS:   Constitutional: Denies fevers, chills or abnormal weight loss, (+) fatigue  Eyes: Denies blurriness of vision Ears, nose, mouth, throat, and face: Denies mucositis or sore throat Respiratory:  Denies cough, dyspnea or wheezes Cardiovascular: Denies palpitation, chest discomfort or lower extremity swelling Gastrointestinal:  Denies nausea, heartburn or change in bowel habits Skin: Denies abnormal skin rashes Lymphatics: Denies new lymphadenopathy or easy bruising Neurological:Denies numbness, tingling or new weaknesses Behavioral/Psych: Mood is stable, no new changes  All other systems were reviewed with the  patient and are negative.  MEDICAL HISTORY:  Past Medical History:  Diagnosis Date  . Cancer ([place]) 06/2018   Pancreatic  . GERD (gastroesophageal reflux disease)     SURGICAL HISTORY: Past Surgical History:  Procedure Laterality Date  . BILIARY STENT PLACEMENT  06/10/2018   Procedure: BILIARY STENT PLACEMENT;  Surgeon: Ronnette Juniper, MD;  Location: [facility] ENDOSCOPY;  Service: Gastroenterology;;  . ERCP N/A 06/10/2018   Procedure: ENDOSCOPIC RETROGRADE CHOLANGIOPANCREATOGRAPHY (ERCP);  Surgeon: Ronnette Juniper, MD;  Location: [place];  Service: Gastroenterology;  Laterality: N/A;  . ESOPHAGOGASTRODUODENOSCOPY (EGD) WITH PROPOFOL N/A 06/10/2018   Procedure: ESOPHAGOGASTRODUODENOSCOPY (EGD) WITH PROPOFOL;  Surgeon: Ronnette Juniper, MD;  Location: [place];  Service: Gastroenterology;  Laterality: N/A;  . FINE NEEDLE ASPIRATION  06/10/2018   Procedure: FINE NEEDLE ASPIRATION (FNA) LINEAR;  Surgeon: Ronnette Juniper, MD;  Location: [facility] ENDOSCOPY;  Service: Gastroenterology;;  . Sol Passer PLACEMENT N/A 07/02/2018   Procedure: INSERTION PORT-A-CATH;  Surgeon: Stark Klein, MD;  Location: [place];  Service: General;  Laterality: N/A;  . SPHINCTEROTOMY  06/10/2018   Procedure: SPHINCTEROTOMY;  Surgeon: Ronnette Juniper, MD;  Location: [facility] ENDOSCOPY;  Service: Gastroenterology;;  . UPPER ESOPHAGEAL ENDOSCOPIC ULTRASOUND (EUS) N/A 06/10/2018   Procedure: UPPER ESOPHAGEAL ENDOSCOPIC ULTRASOUND (EUS);  Surgeon: Ronnette Juniper, MD;  Location: [place];  Service: Gastroenterology;  Laterality: N/A;  . VASECTOMY      I have reviewed the social history and family history with the patient and they are unchanged from previous note.  ALLERGIES:  is allergic to contrast media [iodinated diagnostic agents] and iohexol.  MEDICATIONS:  Current Outpatient Medications  Medication Sig Dispense Refill  . ALPRAZolam (XANAX) 0.25 MG tablet Take 1 tablet (0.25 mg total) by mouth at bedtime as needed for anxiety. 30 tablet 0  .  diphenoxylate-atropine (LOMOTIL) 2.5-0.025 MG tablet Take 2 tablets by mouth 4 (four) times daily as needed for diarrhea or loose stools. 40 tablet 0  . esomeprazole (NEXIUM) 20 MG capsule Take 20 mg by mouth daily.     . folic acid (FOLVITE) 1 MG tablet Take 1 mg by mouth daily.    Marland Kitchen ibuprofen (ADVIL,MOTRIN) 200 MG tablet Take 400 mg by mouth daily as needed for headache or moderate pain.     Marland Kitchen lidocaine-prilocaine (EMLA) cream Apply to affected area once 30 g 3  . LORazepam (ATIVAN) 1 MG tablet Take 1 tab 45 mins before MRI, if not enough, OK to take second tab right before MRI    . magic mouthwash w/lidocaine SOLN Take 5 mLs by mouth 4 (four) times daily as needed for mouth pain. 240 mL 2  . ondansetron (ZOFRAN) 8 MG tablet Take 1 tablet (8 mg total) by mouth 2 (two) times daily as needed for refractory nausea / vomiting. Start on day 3 after chemotherapy. 30 tablet 1  . oxyCODONE (OXY IR/ROXICODONE) 5 MG immediate release tablet Take 1 tablet (5 mg total) by mouth every 6 (six) hours as needed for severe pain. 20 tablet 0  . prochlorperazine (COMPAZINE) 10 MG tablet Take 1 tablet (10 mg total) by mouth every 6 (six) hours as needed (NAUSEA). 30 tablet 1  . rivaroxaban (  XARELTO) 20 MG TABS tablet Take 1 tablet (20 mg total) by mouth daily with supper. 30 tablet 2  . simethicone (MYLICON) 621 MG chewable tablet Chew 125 mg by mouth every 6 (six) hours as needed for flatulence.     No current facility-administered medications for this visit.    Facility-Administered Medications Ordered in Other Visits  Medication Dose Route Frequency Provider Last Rate Last Dose  . atropine injection 0.5 mg  0.5 mg Intravenous Once PRN [name], MD      . fluorouracil (ADRUCIL) 5,000 mg in sodium chloride 0.9 % 150 mL chemo infusion  2,390 mg/m2 (Treatment Plan Recorded) Intravenous 1 day or 1 dose [name], MD      . heparin lock flush 100 unit/mL  500 Units Intracatheter Once PRN [name], MD      .  irinotecan (CAMPTOSAR) 320 mg in dextrose 5 % 500 mL chemo infusion  150 mg/m2 (Treatment Plan Recorded) Intravenous Once [name], MD      . leucovorin 840 mg in dextrose 5 % 250 mL infusion  400 mg/m2 (Treatment Plan Recorded) Intravenous Once [name], MD      . oxaliplatin (ELOXATIN) 180 mg in dextrose 5 % 500 mL chemo infusion  85 mg/m2 (Treatment Plan Recorded) Intravenous Once [name], MD 268 mL/hr at 10/16/18 1211 180 mg at 10/16/18 1211  . sodium chloride flush (NS) 0.9 % injection 10 mL  10 mL Intracatheter PRN [name], MD        PHYSICAL EXAMINATION: ECOG PERFORMANCE STATUS: 1 - Symptomatic but completely ambulatory  Vitals:   10/16/18 1007  BP: 128/80  Pulse: 74  Resp: 18  Temp: 98.2 F (36.8 C)  SpO2: 97%   Filed Weights   10/16/18 1007  Weight: 199 lb 9.6 oz (90.5 kg)    GENERAL:alert, no distress and comfortable SKIN: skin color, texture, turgor are normal, no rashes or significant lesions EYES: normal, Conjunctiva are pink and non-injected, sclera clear OROPHARYNX:no exudate, no erythema and lips, buccal mucosa, and tongue normal  NECK: supple, thyroid normal size, non-tender, without nodularity LYMPH:  no palpable lymphadenopathy in the cervical, axillary or inguinal LUNGS: clear to auscultation and percussion with normal breathing effort HEART: regular rate & rhythm and no murmurs and no lower extremity edema ABDOMEN:abdomen soft, non-tender and normal bowel sounds Musculoskeletal:no cyanosis of digits and no clubbing  NEURO: alert & oriented x 3 with fluent speech, no focal motor/sensory deficits  LABORATORY DATA:  I have reviewed the data as listed CBC Latest Ref Rng & Units 10/16/2018 10/02/2018 09/18/2018  WBC 4.0 - 10.5 K/uL 9.0 6.1 5.6  Hemoglobin 13.0 - 17.0 g/dL 14.1 12.9(L) 12.9(L)  Hematocrit 39.0 - 52.0 % 42.8 39.8 39.1  Platelets 150 - 400 K/uL 141(L) 123(L) 159     CMP Latest Ref Rng & Units 10/16/2018 10/02/2018 09/18/2018  Glucose 70 - 99  mg/dL 127(H) 137(H) 114(H)  BUN 6 - 20 mg/dL 10 6 11   Creatinine 0.61 - 1.24 mg/dL 0.80 0.80 0.78  Sodium 135 - 145 mmol/L 141 143 141  Potassium 3.5 - 5.1 mmol/L 4.2 3.9 4.1  Chloride 98 - 111 mmol/L 106 107 104  CO2 22 - 32 mmol/L 25 25 29   Calcium 8.9 - 10.3 mg/dL 9.2 8.9 9.3  Total Protein 6.5 - 8.1 g/dL 7.0 6.5 6.5  Total Bilirubin 0.3 - 1.2 mg/dL 0.3 0.3 0.3  Alkaline Phos 38 - 126 U/L 151(H) 151(H) 123  AST 15 - 41  U/L 25 30 28   ALT 0 - 44 U/L 37 45(H) 43      RADIOGRAPHIC STUDIES: I have personally reviewed the radiological images as listed and agreed with the findings in the report. No results found.   ASSESSMENT & PLAN:  Ernest Wu is a 58 y.o. male with history of  1. Adenocarcinoma of the pancreas,cT3N1M0, borderline resectable -Diagnosed in 06/2018. Treated with neoadjuvant chemo. Currently on FOLRIRNOX q2 weeks, started in 06/2018. Toleratingwell overall,with mild cold sensitivity, fatigue, and constipation. Will continueneoadjuvant chemo, plan for a total of 4 months (8 cycles). - He has a restaging scan MRI abdomen w wo contrast scheduled for 10/24/2018  - He has been having difficulty with his port but it is okay to use for last treatment and then it will be removed during his surgery  - Labs reviewed, adequate for treatment, will proceed a cycle chemo today, which is the last schedule I before surgery -Follow-up after restaging MRI  -will refer him back to see Dr. Barry Dienes    2.Alcohol and smokingcessation -Hehasquit drinking alcohol, and is currently on folic acid. 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  -He is on Xarelto 20 mg daily now - His left arm and Banks swelling has resolved now   4.  Constipation -Secondary to chemo and premedications. - He takes Miralax and Colase as needed    Plan -labs reviewed, good to proceedwithNeoadjuvantFOLFIRINOX cycle 8today (last scheduled) -will send a message to Dr. Barry Dienes for his f/u appointment  -Abdominal MRI w contrast around 3/20  -Lab, flush and f/u on 3/27    No problem-specific Assessment & Plan notes found for this encounter.   No orders of the defined types were placed in this encounter.  All questions were answered. The patient knows to call the clinic with any problems, questions or concerns. No barriers to learning was detected. I spent 20 minutes counseling the patient face to face. The total time spent in the appointment was 25 minutes and more than 50% was on counseling and review of test results  I, Manson Allan am acting as scribe for Dr. [name].  I have reviewed the above documentation for accuracy and completeness, and I agree with the above.     [name], MD 10/16/2018

## 2018-10-16 ENCOUNTER — Inpatient Hospital Stay: Payer: Managed Care, Other (non HMO)

## 2018-10-16 ENCOUNTER — Inpatient Hospital Stay: Payer: Managed Care, Other (non HMO) | Attending: Hematology

## 2018-10-16 ENCOUNTER — Inpatient Hospital Stay (HOSPITAL_BASED_OUTPATIENT_CLINIC_OR_DEPARTMENT_OTHER): Payer: Managed Care, Other (non HMO) | Admitting: Hematology

## 2018-10-16 ENCOUNTER — Other Ambulatory Visit: Payer: Self-pay

## 2018-10-16 ENCOUNTER — Encounter: Payer: Self-pay | Admitting: Hematology

## 2018-10-16 VITALS — BP 128/80 | HR 74 | Temp 98.2°F | Resp 18 | Ht 72.0 in | Wt 199.6 lb

## 2018-10-16 DIAGNOSIS — I700 Atherosclerosis of aorta: Secondary | ICD-10-CM

## 2018-10-16 DIAGNOSIS — Z791 Long term (current) use of non-steroidal anti-inflammatories (NSAID): Secondary | ICD-10-CM | POA: Insufficient documentation

## 2018-10-16 DIAGNOSIS — R5383 Other fatigue: Secondary | ICD-10-CM

## 2018-10-16 DIAGNOSIS — Z7689 Persons encountering health services in other specified circumstances: Secondary | ICD-10-CM | POA: Insufficient documentation

## 2018-10-16 DIAGNOSIS — Z79899 Other long term (current) drug therapy: Secondary | ICD-10-CM | POA: Insufficient documentation

## 2018-10-16 DIAGNOSIS — C257 Malignant neoplasm of other parts of pancreas: Secondary | ICD-10-CM | POA: Diagnosis not present

## 2018-10-16 DIAGNOSIS — Z5111 Encounter for antineoplastic chemotherapy: Secondary | ICD-10-CM | POA: Diagnosis not present

## 2018-10-16 DIAGNOSIS — Z86718 Personal history of other venous thrombosis and embolism: Secondary | ICD-10-CM

## 2018-10-16 DIAGNOSIS — K769 Liver disease, unspecified: Secondary | ICD-10-CM

## 2018-10-16 DIAGNOSIS — R918 Other nonspecific abnormal finding of lung field: Secondary | ICD-10-CM | POA: Diagnosis not present

## 2018-10-16 DIAGNOSIS — I2510 Atherosclerotic heart disease of native coronary artery without angina pectoris: Secondary | ICD-10-CM | POA: Insufficient documentation

## 2018-10-16 DIAGNOSIS — Z7901 Long term (current) use of anticoagulants: Secondary | ICD-10-CM | POA: Diagnosis not present

## 2018-10-16 DIAGNOSIS — K219 Gastro-esophageal reflux disease without esophagitis: Secondary | ICD-10-CM | POA: Diagnosis not present

## 2018-10-16 DIAGNOSIS — K5900 Constipation, unspecified: Secondary | ICD-10-CM

## 2018-10-16 DIAGNOSIS — C259 Malignant neoplasm of pancreas, unspecified: Secondary | ICD-10-CM

## 2018-10-16 LAB — CBC WITH DIFFERENTIAL/PLATELET
Abs Immature Granulocytes: 0.07 10*3/uL (ref 0.00–0.07)
Basophils Absolute: 0.1 10*3/uL (ref 0.0–0.1)
Basophils Relative: 1 %
Eosinophils Absolute: 0.2 10*3/uL (ref 0.0–0.5)
Eosinophils Relative: 2 %
HCT: 42.8 % (ref 39.0–52.0)
Hemoglobin: 14.1 g/dL (ref 13.0–17.0)
IMMATURE GRANULOCYTES: 1 %
Lymphocytes Relative: 13 %
Lymphs Abs: 1.2 10*3/uL (ref 0.7–4.0)
MCH: 31.4 pg (ref 26.0–34.0)
MCHC: 32.9 g/dL (ref 30.0–36.0)
MCV: 95.3 fL (ref 80.0–100.0)
MONOS PCT: 8 %
Monocytes Absolute: 0.7 10*3/uL (ref 0.1–1.0)
Neutro Abs: 6.8 10*3/uL (ref 1.7–7.7)
Neutrophils Relative %: 75 %
Platelets: 141 10*3/uL — ABNORMAL LOW (ref 150–400)
RBC: 4.49 MIL/uL (ref 4.22–5.81)
RDW: 16 % — ABNORMAL HIGH (ref 11.5–15.5)
WBC: 9 10*3/uL (ref 4.0–10.5)
nRBC: 0 % (ref 0.0–0.2)

## 2018-10-16 LAB — COMPREHENSIVE METABOLIC PANEL
ALT: 37 U/L (ref 0–44)
AST: 25 U/L (ref 15–41)
Albumin: 3.9 g/dL (ref 3.5–5.0)
Alkaline Phosphatase: 151 U/L — ABNORMAL HIGH (ref 38–126)
Anion gap: 10 (ref 5–15)
BUN: 10 mg/dL (ref 6–20)
CO2: 25 mmol/L (ref 22–32)
Calcium: 9.2 mg/dL (ref 8.9–10.3)
Chloride: 106 mmol/L (ref 98–111)
Creatinine, Ser: 0.8 mg/dL (ref 0.61–1.24)
GFR calc Af Amer: >60 mL/min (ref >60)
GFR calc non Af Amer: >60 mL/min (ref >60)
Glucose, Bld: 127 mg/dL — ABNORMAL HIGH (ref 70–99)
Potassium: 4.2 mmol/L (ref 3.5–5.1)
Sodium: 141 mmol/L (ref 135–145)
Total Bilirubin: 0.3 mg/dL (ref 0.3–1.2)
Total Protein: 7 g/dL (ref 6.5–8.1)

## 2018-10-16 MED ORDER — ONDANSETRON HCL 4 MG/2ML IJ SOLN: 8.0000 mg | Freq: Once | INTRAMUSCULAR | Status: DC

## 2018-10-16 MED ORDER — IRINOTECAN HCL CHEMO INJECTION 100 MG/5ML
150.0000 mg/m2 | Freq: Once | INTRAVENOUS | Status: AC
  Administered 2018-10-16: 320 mg via INTRAVENOUS
  Filled 2018-10-16: qty 15

## 2018-10-16 MED ORDER — PROCHLORPERAZINE EDISYLATE 10 MG/2ML IJ SOLN
INTRAMUSCULAR | Status: AC
  Filled 2018-10-16: qty 2

## 2018-10-16 MED ORDER — ONDANSETRON HCL 4 MG/2ML IJ SOLN
INTRAMUSCULAR | Status: AC
  Filled 2018-10-16: qty 4

## 2018-10-16 MED ORDER — DEXTROSE 5 % IV SOLN
Freq: Once | INTRAVENOUS | Status: AC
  Administered 2018-10-16: 11:00:00 via INTRAVENOUS
  Filled 2018-10-16: qty 250

## 2018-10-16 MED ORDER — OXALIPLATIN CHEMO INJECTION 100 MG/20ML
85.0000 mg/m2 | Freq: Once | INTRAVENOUS | Status: AC
  Administered 2018-10-16: 180 mg via INTRAVENOUS
  Filled 2018-10-16: qty 36

## 2018-10-16 MED ORDER — DEXAMETHASONE SODIUM PHOSPHATE 10 MG/ML IJ SOLN
10.0000 mg | Freq: Once | INTRAMUSCULAR | Status: AC
  Administered 2018-10-16: 10 mg via INTRAVENOUS

## 2018-10-16 MED ORDER — PALONOSETRON HCL INJECTION 0.25 MG/5ML
INTRAVENOUS | Status: AC
  Filled 2018-10-16: qty 5

## 2018-10-16 MED ORDER — ATROPINE SULFATE 1 MG/ML IJ SOLN
INTRAMUSCULAR | Status: AC
  Filled 2018-10-16: qty 1

## 2018-10-16 MED ORDER — ATROPINE SULFATE 1 MG/ML IJ SOLN
0.5000 mg | Freq: Once | INTRAMUSCULAR | Status: AC | PRN
  Administered 2018-10-16: 0.5 mg via INTRAVENOUS

## 2018-10-16 MED ORDER — PALONOSETRON HCL INJECTION 0.25 MG/5ML
0.2500 mg | Freq: Once | INTRAVENOUS | Status: AC
  Administered 2018-10-16: 0.25 mg via INTRAVENOUS

## 2018-10-16 MED ORDER — PROCHLORPERAZINE EDISYLATE 10 MG/2ML IJ SOLN
10.0000 mg | Freq: Once | INTRAMUSCULAR | Status: AC
  Administered 2018-10-16: 10 mg via INTRAVENOUS
  Filled 2018-10-16: qty 2

## 2018-10-16 MED ORDER — LEUCOVORIN CALCIUM INJECTION 350 MG
400.0000 mg/m2 | Freq: Once | INTRAVENOUS | Status: AC
  Administered 2018-10-16: 840 mg via INTRAVENOUS
  Filled 2018-10-16: qty 42

## 2018-10-16 MED ORDER — SODIUM CHLORIDE 0.9 % IV SOLN
2390.0000 mg/m2 | INTRAVENOUS | Status: DC
  Administered 2018-10-16: 5000 mg via INTRAVENOUS
  Filled 2018-10-16: qty 100

## 2018-10-16 MED ORDER — DEXAMETHASONE SODIUM PHOSPHATE 10 MG/ML IJ SOLN
INTRAMUSCULAR | Status: AC
  Filled 2018-10-16: qty 1

## 2018-10-16 NOTE — Patient Instructions (Addendum)
[facility] Discharge Instructions for Patients Receiving Chemotherapy  Congratulations on your last chemotherapy!!!!  Today you received the following chemotherapy agents:  Oxaliplatin, Irinotecan, Leucovorin, and 5FU.  To help prevent nausea and vomiting after your treatment, we encourage you to take your nausea medication as directed.   If you develop nausea and vomiting that is not controlled by your nausea medication, call the clinic.   BELOW ARE SYMPTOMS THAT SHOULD BE REPORTED IMMEDIATELY:  *FEVER GREATER THAN 100.5 F  *CHILLS WITH OR WITHOUT FEVER  NAUSEA AND VOMITING THAT IS NOT CONTROLLED WITH YOUR NAUSEA MEDICATION  *UNUSUAL SHORTNESS OF BREATH  *UNUSUAL BRUISING OR BLEEDING  TENDERNESS IN MOUTH AND THROAT WITH OR WITHOUT PRESENCE OF ULCERS  *URINARY PROBLEMS  *BOWEL PROBLEMS  UNUSUAL RASH Items with * indicate a potential emergency and should be followed up as soon as possible.  Feel free to call the clinic should you have any questions or concerns. The clinic phone number is [phone].  Please show the [place] at check-in to the Emergency Department and triage nurse.

## 2018-10-16 NOTE — Progress Notes (Signed)
Patient with mild nausea after completion of Irinotecan and 5FU.  Orders obtained for Compazine 10mg  IV via Dr. Burr Medico.  Patient tolerated well, nausea subsided prior to discharge.  Discharged in no distress.

## 2018-10-17 ENCOUNTER — Telehealth: Payer: Self-pay | Admitting: Hematology

## 2018-10-17 LAB — CANCER ANTIGEN 19-9: CAN 19-9: 25 U/mL (ref 0–35)

## 2018-10-17 NOTE — Telephone Encounter (Signed)
No los per 3/12.

## 2018-10-18 ENCOUNTER — Other Ambulatory Visit: Payer: Self-pay

## 2018-10-18 ENCOUNTER — Inpatient Hospital Stay: Payer: Managed Care, Other (non HMO)

## 2018-10-18 VITALS — BP 128/80 | HR 85 | Temp 98.4°F | Resp 20

## 2018-10-18 DIAGNOSIS — C259 Malignant neoplasm of pancreas, unspecified: Secondary | ICD-10-CM

## 2018-10-18 DIAGNOSIS — C257 Malignant neoplasm of other parts of pancreas: Secondary | ICD-10-CM | POA: Diagnosis not present

## 2018-10-18 MED ORDER — SODIUM CHLORIDE 0.9% FLUSH
10.0000 mL | INTRAVENOUS | Status: DC | PRN
  Administered 2018-10-18: 10 mL
  Filled 2018-10-18: qty 10

## 2018-10-18 MED ORDER — PEGFILGRASTIM-CBQV 6 MG/0.6ML ~~LOC~~ SOSY
PREFILLED_SYRINGE | SUBCUTANEOUS | Status: AC
  Filled 2018-10-18: qty 0.6

## 2018-10-18 MED ORDER — HEPARIN SOD (PORK) LOCK FLUSH 100 UNIT/ML IV SOLN
500.0000 [IU] | Freq: Once | INTRAVENOUS | Status: AC | PRN
  Administered 2018-10-18: 500 [IU]
  Filled 2018-10-18: qty 5

## 2018-10-18 MED ORDER — PEGFILGRASTIM-CBQV 6 MG/0.6ML ~~LOC~~ SOSY
6.0000 mg | PREFILLED_SYRINGE | Freq: Once | SUBCUTANEOUS | Status: AC
  Administered 2018-10-18: 6 mg via SUBCUTANEOUS

## 2018-10-20 ENCOUNTER — Telehealth: Payer: Self-pay

## 2018-10-20 ENCOUNTER — Other Ambulatory Visit: Payer: Self-pay

## 2018-10-20 DIAGNOSIS — R059 Cough, unspecified: Secondary | ICD-10-CM

## 2018-10-20 DIAGNOSIS — J811 Chronic pulmonary edema: Secondary | ICD-10-CM

## 2018-10-20 DIAGNOSIS — R05 Cough: Secondary | ICD-10-CM

## 2018-10-20 DIAGNOSIS — R058 Other specified cough: Secondary | ICD-10-CM

## 2018-10-20 MED ORDER — AZITHROMYCIN 500 MG PO TABS: 500.0000 mg | ORAL_TABLET | Freq: Every day | ORAL | 0 refills | Status: DC

## 2018-10-20 NOTE — Telephone Encounter (Signed)
Patient's wife [name] calls stating patient has had congestion with productive colored sputum, taking allergy pill not helping, denies fever, discussed with Dr. Burr Medico will send in Azithromycin 500 mg one tablet daily x 3 days to their pharmacy.  Called her back to let her know.

## 2018-10-24 ENCOUNTER — Ambulatory Visit (HOSPITAL_COMMUNITY)
Admission: RE | Admit: 2018-10-24 | Discharge: 2018-10-24 | Disposition: A | Discharge status: Home / Self Care | Payer: Managed Care, Other (non HMO) | Source: Ambulatory Visit | Attending: Hematology | Admitting: Hematology

## 2018-10-24 ENCOUNTER — Other Ambulatory Visit: Payer: Self-pay | Admitting: Hematology

## 2018-10-24 ENCOUNTER — Other Ambulatory Visit: Payer: Self-pay

## 2018-10-24 DIAGNOSIS — C259 Malignant neoplasm of pancreas, unspecified: Secondary | ICD-10-CM | POA: Insufficient documentation

## 2018-10-24 MED ORDER — GADOBUTROL 1 MMOL/ML IV SOLN
9.0000 mL | Freq: Once | INTRAVENOUS | Status: AC | PRN
  Administered 2018-10-24: 9 mL via INTRAVENOUS

## 2018-10-27 ENCOUNTER — Other Ambulatory Visit: Payer: Self-pay | Admitting: General Surgery

## 2018-10-28 ENCOUNTER — Telehealth: Payer: Self-pay | Admitting: Hematology

## 2018-10-28 NOTE — Telephone Encounter (Signed)
Left message for patient per 3/23 sch message - appt changed to virtual visit.

## 2018-10-29 NOTE — Progress Notes (Signed)
[place]   [phone] [phone]) [phone]   Clinic Follow up Note   Patient Care Team: Street, [name], MD as PCP - General (Family Medicine)   I connected with Ernest Wu on 10/31/2018 at 11:45 AM EDT by telephone and verified that I am speaking with the correct person using two identifiers.   I discussed the limitations, risks, security and privacy concerns of performing an evaluation and management service by telephone and the availability of in person appointments. I also discussed with the patient that there may be a patient responsible charge related to this service. The patient expressed understanding and agreed to proceed.    CHIEF COMPLAINT: F/u pancreatic cancer  SUMMARY OF ONCOLOGIC HISTORY: Oncology History   Cancer Staging Adenocarcinoma of pancreas [facility]) Staging form: Exocrine Pancreas, AJCC 8th Edition - Clinical stage from 06/10/2018: Stage IB (cT2, cN0, cM0) - Signed by [name], MD on 06/29/2018       Adenocarcinoma of pancreas ([place])   06/07/2018 Imaging    CT AP w Contrast IMPRESSION: 1. There is a low attenuation mass centered around the neck of pancreas which is concerning for neoplasm. Pancreatic adenocarcinoma favored. This results in common bile duct obstruction with mild intrahepatic biliary ductal dilatation. There also is involvement of the portal venous confluence. Further evaluation with nonemergent contrast enhanced MRI of the pancreas is recommended. 2. Small indeterminate low-attenuation structure is noted within segment 4 a of the liver. This could be better addressed at MRI.    06/09/2018 Imaging    MR ABD MRCP  The pancreatic mass involves approximately 40 percent of the main portal vein circumference at the portal splenic venous confluence, with associated mild narrowing of the portal splenic venous confluence. The SMV, splenic vein and main, right and left portal veins remain patent. The celiac trunk and  SMA are not involved by the pancreatic mass.  IMPRESSION: 2.3 cm diameter hypoenhancing mass at the junction of the head and neck of pancreas with pancreatic ductal dilatation, likely adenocarcinoma.  Intra and extrahepatic bile duct dilatation with abrupt change in caliber at the mid common bile duct. This could indicate an obstructing mass or stricture.     06/10/2018 Initial Biopsy    Diagnosis PANCREAS, FINE NEEDLE ASPIRATION (SPECIMEN 1 OF 1 COLLECTED 06/10/18): MALIGNANT CELLS CONSISTENT WITH ADENOCARCINOMA.    06/10/2018 Procedure    IMPRESSION: 1. 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 2. Placement of a metallic biliary stent.    06/10/2018 Procedure    EUS per Dr. Paulita Fujita Impression:  - There was dilation in the common bile duct which measured up to 12 mm. - A mass was identified in the pancreatic head. This was staged T3 N1 Mx by endosonographic criteria. Fine needle aspiration performed. - A few lymph nodes were visualized and measured in the peripancreatic region. - There was no evidence of significant pathology in the left lobe of the liver.    06/10/2018 Cancer Staging    Staging form: Exocrine Pancreas, AJCC 8th Edition - Clinical stage from 06/10/2018: Stage IB (cT2, cN0, cM0) - Signed by [name], MD on 06/29/2018    06/27/2018 Initial Diagnosis    Adenocarcinoma of pancreas ([place])    07/02/2018 Imaging    IMPRESSION: 1. Multiple small pulmonary nodules scattered throughout the lungs measuring up to 7 mm in size. These are nonspecific, but the possibility of metastatic disease should be considered, and close attention  at time of routine followups is recommended. 2. In addition, today's study demonstrates new and enlarging low-attenuation lesions in the liver. This is poorly evaluated on today's noncontrast CT examination, but is concerning for potential metastatic disease. Further evaluation with repeat  nonemergent MRI of the abdomen with and without IV gadolinium is suggested in the near future to better evaluate these findings. 3. Aortic atherosclerosis, in addition to left main and 2 vessel coronary artery disease. Please note that although the presence of coronary artery calcium documents the presence of coronary artery disease, the severity of this disease and any potential stenosis cannot be assessed on this non-gated CT examination. Assessment for potential risk factor modification, dietary therapy or pharmacologic therapy may be warranted, if clinically indicated. 4. Mild aneurysmal dilatation of the ascending thoracic aorta (4.8 cm in diameter). Ascending thoracic aortic aneurysm. Recommend semi-annual imaging followup by CTA or MRA and referral to cardiothoracic surgery if not already obtained. This recommendation follows 2010 ACCF/AHA/AATS/ACR/ASA/SCA/SCAI/SIR/STS/SVM Guidelines for the Diagnosis and Management of Patients With Thoracic Aortic Disease. Circulation. 2010; 121: H417-E081.  Aortic Atherosclerosis (ICD10-I70.0). Aortic aneurysm NOS (ICD10-I71.9).    07/04/2018 - 10/16/2018 Chemotherapy     FOLFIRINOX q2 weeks    07/15/2018 Imaging    07/15/2018 Liver US IMPRESSION: No liver lesions identified with ultrasound. Ultrasound-guided biopsy was not performed. Recommend further characterization for liver lesions with a repeat MRI, with and without contrast.    07/24/2018 Imaging    07/24/2018 MRI Abdomen IMPRESSION: 1. The hypoenhancing mass at the junction of the pancreatic body and head has reduced in size, previously 3.2 by 2.8 cm and currently 2.9 by 2.2 cm. A small peripancreatic lymph node adjacent to the mass was previously 0.9 cm in short axis and is currently 0.7 cm in short axis. The amount of contact between the pancreatic mass and the confluence of the splenic vein and SMV is similar to the prior exam. 2. There is a 6 mm probable hemangioma in  segment 4a of the liver, based on the delayed enhancement pattern. This is somewhat ill-defined. The lesion appeared larger on the prior noncontrast CT but presumably may have been overestimated on that exam. There is also some hypodensity along the dome of the right hepatic lobe which appears to most likely be due to a slip of the diaphragm rather than a discernible lesion on MRI. 3. Aortic Atherosclerosis (ICD10-I70.0). Notably, there is a small amount of mural thrombus along the right side of the abdominal aorta below the right renal artery level which has not been seen previously.    09/03/2018 Imaging    09/03/2018 MRI Abdomen IMPRESSION: 1. Stable mass (adenocarcinoma) in the neck of the pancreas with upstream duct dilatation. 2. No evidence of lymphadenopathy in the porta hepatis or peripancreatic fat. 3. No evidence hepatic metastasis. 4. Mild biliary duct dilatation LEFT hepatic lobe similar to comparison exam. Biliary stent within the common bile duct.    09/16/2018 Imaging    MR MRA CHEST W WO CONTRAST  IMPRESSION: VASCULAR  1. 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 2. Left subclavian port catheter to the SVC. 3. SVC is patent      10/24/2018 Imaging    MRI abdomen  IMPRESSION: 1. Slight interval decrease in size of the pancreatic mass. 2. Stable to slightly smaller adjacent lymph nodes. 3. No findings for metastatic disease involving the liver or lung bases. 4. Common bile duct stent in good position without complicating features.  CURRENT THERAPY:  PENDING Surgery for 11/10/18   INTERVAL HISTORY:  Ernest Wu is here for a follow up of treatment and pancreatic cancer. He was able to identify himself by birthdate. He notes he is doing well. He recently saw Dr. Barry Dienes and plans to have surgery in 2 weeks.  He feels he has recovered from chemo treatment well. He is eating adequately, denies  neuropathy and overall at baseline. He notes he has occasional stomach cramps and notes his constipation has much improved. He is overall ready to proceed with surgery.    REVIEW OF SYSTEMS:   Constitutional: Denies fevers, chills or abnormal weight loss Eyes: Denies blurriness of vision Ears, nose, mouth, throat, and face: Denies mucositis or sore throat Respiratory: Denies cough, dyspnea or wheezes Cardiovascular: Denies palpitation, chest discomfort or lower extremity swelling Gastrointestinal:  Denies nausea, heartburn  Skin: Denies abnormal skin rashes Lymphatics: Denies new lymphadenopathy or easy bruising Neurological:Denies numbness, tingling or new weaknesses Behavioral/Psych: Mood is stable, no new changes  All other systems were reviewed with the patient and are negative.  MEDICAL HISTORY:  Past Medical History:  Diagnosis Date  . Cancer ([place]) 06/2018   Pancreatic  . GERD (gastroesophageal reflux disease)     SURGICAL HISTORY: Past Surgical History:  Procedure Laterality Date  . BILIARY STENT PLACEMENT  06/10/2018   Procedure: BILIARY STENT PLACEMENT;  Surgeon: Ronnette Juniper, MD;  Location: [facility] ENDOSCOPY;  Service: Gastroenterology;;  . ERCP N/A 06/10/2018   Procedure: ENDOSCOPIC RETROGRADE CHOLANGIOPANCREATOGRAPHY (ERCP);  Surgeon: Ronnette Juniper, MD;  Location: [place];  Service: Gastroenterology;  Laterality: N/A;  . ESOPHAGOGASTRODUODENOSCOPY (EGD) WITH PROPOFOL N/A 06/10/2018   Procedure: ESOPHAGOGASTRODUODENOSCOPY (EGD) WITH PROPOFOL;  Surgeon: Ronnette Juniper, MD;  Location: [place];  Service: Gastroenterology;  Laterality: N/A;  . FINE NEEDLE ASPIRATION  06/10/2018   Procedure: FINE NEEDLE ASPIRATION (FNA) LINEAR;  Surgeon: Ronnette Juniper, MD;  Location: [facility] ENDOSCOPY;  Service: Gastroenterology;;  . Sol Passer PLACEMENT N/A 07/02/2018   Procedure: INSERTION PORT-A-CATH;  Surgeon: Stark Klein, MD;  Location: [place];  Service: General;  Laterality: N/A;  .  SPHINCTEROTOMY  06/10/2018   Procedure: SPHINCTEROTOMY;  Surgeon: Ronnette Juniper, MD;  Location: [facility] ENDOSCOPY;  Service: Gastroenterology;;  . UPPER ESOPHAGEAL ENDOSCOPIC ULTRASOUND (EUS) N/A 06/10/2018   Procedure: UPPER ESOPHAGEAL ENDOSCOPIC ULTRASOUND (EUS);  Surgeon: Ronnette Juniper, MD;  Location: [place];  Service: Gastroenterology;  Laterality: N/A;  . VASECTOMY      I have reviewed the social history and family history with the patient and they are unchanged from previous note.  ALLERGIES:  is allergic to contrast media [iodinated diagnostic agents] and iohexol.  MEDICATIONS:  Current Outpatient Medications  Medication Sig Dispense Refill  . ALPRAZolam (XANAX) 0.25 MG tablet Take 1 tablet (0.25 mg total) by mouth at bedtime as needed for anxiety. 30 tablet 0  . azithromycin (ZITHROMAX) 500 MG tablet Take 1 tablet (500 mg total) by mouth daily. (Patient not taking: Reported on 10/28/2018) 3 tablet 0  . diphenoxylate-atropine (LOMOTIL) 2.5-0.025 MG tablet Take 2 tablets by mouth 4 (four) times daily as needed for diarrhea or loose stools. 40 tablet 0  . esomeprazole (NEXIUM) 20 MG capsule Take 20 mg by mouth daily after breakfast.     . folic acid (FOLVITE) 1 MG tablet Take 1 mg by mouth daily after breakfast.     . ibuprofen (ADVIL,MOTRIN) 200 MG tablet Take 400 mg by mouth daily as needed for headache or moderate pain.     Marland Kitchen  lidocaine-prilocaine (EMLA) cream Apply to affected area once (Patient not taking: Reported on 10/28/2018) 30 g 3  . loratadine (CLARITIN) 10 MG tablet Take 10 mg by mouth daily after breakfast.    . magic mouthwash w/lidocaine SOLN Take 5 mLs by mouth 4 (four) times daily as needed for mouth pain. 240 mL 2  . ondansetron (ZOFRAN) 8 MG tablet Take 1 tablet (8 mg total) by mouth 2 (two) times daily as needed for refractory nausea / vomiting. Start on day 3 after chemotherapy. 30 tablet 1  . oxyCODONE (OXY IR/ROXICODONE) 5 MG immediate release tablet Take 1 tablet (5 mg  total) by mouth every 6 (six) hours as needed for severe pain. 20 tablet 0  . prochlorperazine (COMPAZINE) 10 MG tablet Take 1 tablet (10 mg total) by mouth every 6 (six) hours as needed (NAUSEA). 30 tablet 1  . rivaroxaban (XARELTO) 20 MG TABS tablet Take 1 tablet (20 mg total) by mouth daily with supper. (Patient taking differently: Take 20 mg by mouth daily after breakfast. ) 30 tablet 2  . simethicone (MYLICON) 239 MG chewable tablet Chew 125 mg by mouth every 6 (six) hours as needed for flatulence.     No current facility-administered medications for this visit.    Facility-Administered Medications Ordered in Other Visits  Medication Dose Route Frequency Provider Last Rate Last Dose  . heparin lock flush 100 unit/mL  500 Units Intracatheter Once PRN [name], MD      . sodium chloride flush (NS) 0.9 % injection 10 mL  10 mL Intracatheter PRN [name], MD        PHYSICAL EXAMINATION: ECOG PERFORMANCE STATUS: 1 - Symptomatic but completely ambulatory  -No vitals taken  exam deferred today   LABORATORY DATA:  I have reviewed the data as listed CBC Latest Ref Rng & Units 10/16/2018 10/02/2018 09/18/2018  WBC 4.0 - 10.5 K/uL 9.0 6.1 5.6  Hemoglobin 13.0 - 17.0 g/dL 14.1 12.9(L) 12.9(L)  Hematocrit 39.0 - 52.0 % 42.8 39.8 39.1  Platelets 150 - 400 K/uL 141(L) 123(L) 159     CMP Latest Ref Rng & Units 10/16/2018 10/02/2018 09/18/2018  Glucose 70 - 99 mg/dL 127(H) 137(H) 114(H)  BUN 6 - 20 mg/dL 10 6 11   Creatinine 0.61 - 1.24 mg/dL 0.80 0.80 0.78  Sodium 135 - 145 mmol/L 141 143 141  Potassium 3.5 - 5.1 mmol/L 4.2 3.9 4.1  Chloride 98 - 111 mmol/L 106 107 104  CO2 22 - 32 mmol/L 25 25 29   Calcium 8.9 - 10.3 mg/dL 9.2 8.9 9.3  Total Protein 6.5 - 8.1 g/dL 7.0 6.5 6.5  Total Bilirubin 0.3 - 1.2 mg/dL 0.3 0.3 0.3  Alkaline Phos 38 - 126 U/L 151(H) 151(H) 123  AST 15 - 41 U/L 25 30 28   ALT 0 - 44 U/L 37 45(H) 43      RADIOGRAPHIC STUDIES: I have personally reviewed the  radiological images as listed and agreed with the findings in the report. No results found.   ASSESSMENT & PLAN:  Ernest Wu is a 58 y.o. male with   1. Adenocarcinoma of the pancreas,cT3N1M0, borderline resectable -Diagnosed in 06/2018. Treated with neoadjuvant chemo FOLRIRNOX q2 weeks -He has been having difficulty with his port but it is okay to use for last treatment and then it will be removed during his surgery  -He has recovered from chemo well. His constipation is much improved and only has occasional stomach cramps.   -I reviewed his MRI  abdomen from 10/24/18 and discussed it with patient. Shows  -He is clinically doing well. CBC, CMP and CEA from last week WNL except mild thrombocytopenia at 141K, BG 127 and alk phos 151.  -He will proceed with surgery on 11/10/18 with Dr. Barry Dienes. Based on pathology report I likely do not plan to start him on adjuvant chemo.  -Given high risk of recurrence I recommend he keep his port for 1-2 years and continue port flush. He is agreeable.  -f/u 3-4 weeks after surgery   2.Alcohol and smokingcessation -Hehasquit drinking alcohol, and is currently on folic acid. 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 -His left arm and Sledge swelling had resolved  -Continue Xarelto 62m daily and hold starting 4/1 until a few days afterward.  -I offered Lovenox 1235minjections over 3 days before surgery starting 4/2. He agrees   4. Constipation  -Secondary to chemo and premedications. -He takes Miralax and Colase as needed  -Much improved.    Plan   -MRI abdomen reviewed with pt. He is clinically doing well -Will proceed with surgery on 11/10/18  -Hold Xarelto after 11/05/2018, and bridge with Lovenox injections  12084maily ob 4/2, 4/3, 4/4 -f/u 3-4 weeks after surgery     No problem-specific Assessment & Plan notes found for this encounter.   No orders of the defined types were placed in this encounter.  All questions were answered. The patient knows to call the clinic with any problems, questions or concerns. No barriers to learning was detected. I spent 15 minutes counseling the patient face to face. The total time spent in the appointment was 20 minutes and more than 50% was on counseling and review of test results     YanTruitt MerleD 10/31/2018   I, AmoJoslyn Devonm acting as scribe for YanTruitt MerleD.   I have reviewed the above documentation for accuracy and completeness, and I agree with the above.

## 2018-10-31 ENCOUNTER — Inpatient Hospital Stay: Payer: Managed Care, Other (non HMO)

## 2018-10-31 ENCOUNTER — Other Ambulatory Visit: Payer: Self-pay

## 2018-10-31 ENCOUNTER — Inpatient Hospital Stay (HOSPITAL_BASED_OUTPATIENT_CLINIC_OR_DEPARTMENT_OTHER): Payer: Managed Care, Other (non HMO) | Admitting: Hematology

## 2018-10-31 DIAGNOSIS — Z86718 Personal history of other venous thrombosis and embolism: Secondary | ICD-10-CM | POA: Diagnosis not present

## 2018-10-31 DIAGNOSIS — Z7901 Long term (current) use of anticoagulants: Secondary | ICD-10-CM | POA: Diagnosis not present

## 2018-10-31 DIAGNOSIS — C259 Malignant neoplasm of pancreas, unspecified: Secondary | ICD-10-CM | POA: Diagnosis not present

## 2018-10-31 DIAGNOSIS — K5900 Constipation, unspecified: Secondary | ICD-10-CM

## 2018-10-31 DIAGNOSIS — Z9221 Personal history of antineoplastic chemotherapy: Secondary | ICD-10-CM | POA: Diagnosis not present

## 2018-10-31 NOTE — Progress Notes (Signed)
Per 10/29/18 TB - Patient to undergo diagnostic laparoscopy, port removal and Whipple procedure on 11/10/18

## 2018-10-31 NOTE — Pre-Procedure Instructions (Signed)
Ernest Wu  10/31/2018      [place], [place] [address] Phone: [phone] Fax: [phone]    Your procedure is scheduled on 11/10/18, Monday  From 0930-0430pm  Report to [facility] at 0730 A.M.  Call this number if you have problems the morning of surgery:  [phone]   Remember:  Do not eat  after midnight.   You may drink clear liquids until 0630 AM day of surgery .  Clear liquids allowed are: Water, Juice (non-citric and without pulp), Carbonated beverages, Clear Tea, Black Coffee only, Plain Jell-O only, Gatorade and Plain Popsicles only   Please complete your PRE-SURGERY ENSURE that was provided to you.Take 2 bottles by 10 pm the night before surgery and 1 bottle by 06:30AM the day of surgery.   Please, if able, drink it in one setting. DO NOT SIP.   Take these medicines the morning of surgery with A SIP OF WATER  ;              Esomeprazole (Nexium)              Loratadine (Claritin)               As needed:              Ondansetron(Zofran)              Oxycodone(Oxy IR/Roxicodone)  Starting today,  STOP taking any Aspirin (unless otherwise instructed by your surgeon), Aleve, Naproxen, Ibuprofen, Motrin, Advil, Goody's, BC's, all herbal medications, fish oil, and all vitamins.  Follow your surgeon's instructions on when to stop Xarelto.  If no instructions were given by your surgeon then you will need to call the office to get those instructions.      Do not wear jewelry, make-up or nail polish.  Do not wear lotions, powders, or colognes, or deodorant.  Do not shave 48 hours prior to surgery.  Men may shave face and neck.  Do not bring valuables to the hospital.  [facility] is not responsible for any belongings or valuables.  Contacts, dentures or bridgework may not be worn into surgery.  Leave your suitcase in the car.  After surgery it may be brought to your  room.  For patients admitted to the hospital, discharge time will be determined by your treatment team.  Patients discharged the day of surgery will not be allowed to drive home.   Special instructions:  [name]- Preparing For Surgery  Before surgery, you can play an important role. Because skin is not sterile, your skin needs to be as free of germs as possible. You can reduce the number of germs on your skin by washing with CHG (chlorahexidine gluconate) Soap before surgery.  CHG is an antiseptic cleaner which kills germs and bonds with the skin to continue killing germs even after washing.    Oral Hygiene is also important to reduce your risk of infection.  Remember - BRUSH YOUR TEETH THE MORNING OF SURGERY WITH YOUR REGULAR TOOTHPASTE  Please do not use if you have an allergy to CHG or antibacterial soaps. If your skin becomes reddened/irritated stop using the CHG.  Do not shave (including legs and underarms) for at least 48 hours prior to first CHG shower. It is OK to shave your face.  Please follow these instructions carefully.   1. Shower the NIGHT BEFORE SURGERY and  the MORNING OF SURGERY with CHG.   2. If you chose to wash your hair, wash your hair first as usual with your normal shampoo.  3. After you shampoo, rinse your hair and body thoroughly to remove the shampoo.  4. Use CHG as you would any other liquid soap. You can apply CHG directly to the skin and wash gently with a scrungie or a clean washcloth.   5. Apply the CHG Soap to your body ONLY FROM THE NECK DOWN.  Do not use on open wounds or open sores. Avoid contact with your eyes, ears, mouth and genitals (private parts). Wash Face and genitals (private parts)  with your normal soap.  6. Wash thoroughly, paying special attention to the area where your surgery will be performed.  7. Thoroughly rinse your body with warm water from the neck down.  8. DO NOT shower/wash with your normal soap after using and rinsing off  the CHG Soap.  9. Pat yourself dry with a CLEAN TOWEL.  10. Wear CLEAN PAJAMAS to bed the night before surgery, wear comfortable clothes the morning of surgery  11. Place CLEAN SHEETS on your bed the night of your first shower and DO NOT SLEEP WITH PETS.  Day of Surgery:  Do not apply any deodorants/lotions.  Please wear clean clothes to the hospital/surgery center.   Remember to brush your teeth WITH YOUR REGULAR TOOTHPASTE.  Please read over the following fact sheets that you were given. Pain Booklet, Coughing and Deep Breathing and Surgical Site Infection Prevention

## 2018-11-03 ENCOUNTER — Other Ambulatory Visit: Payer: Self-pay | Admitting: Hematology

## 2018-11-03 ENCOUNTER — Telehealth: Payer: Self-pay

## 2018-11-03 ENCOUNTER — Encounter (HOSPITAL_COMMUNITY)
Admission: RE | Admit: 2018-11-03 | Discharge: 2018-11-03 | Disposition: A | Discharge status: Home / Self Care | Payer: Managed Care, Other (non HMO) | Source: Ambulatory Visit | Attending: General Surgery | Admitting: General Surgery

## 2018-11-03 ENCOUNTER — Other Ambulatory Visit: Payer: Self-pay

## 2018-11-03 ENCOUNTER — Telehealth: Payer: Self-pay | Admitting: Hematology

## 2018-11-03 ENCOUNTER — Encounter (HOSPITAL_COMMUNITY): Payer: Self-pay

## 2018-11-03 DIAGNOSIS — Z01812 Encounter for preprocedural laboratory examination: Secondary | ICD-10-CM | POA: Diagnosis not present

## 2018-11-03 HISTORY — DX: Anxiety disorder, unspecified: F41.9

## 2018-11-03 LAB — CBC WITH DIFFERENTIAL/PLATELET
Abs Immature Granulocytes: 0.02 10*3/uL (ref 0.00–0.07)
Basophils Absolute: 0 10*3/uL (ref 0.0–0.1)
Basophils Relative: 1 %
Eosinophils Absolute: 0.2 10*3/uL (ref 0.0–0.5)
Eosinophils Relative: 3 %
HCT: 44.2 % (ref 39.0–52.0)
Hemoglobin: 13.8 g/dL (ref 13.0–17.0)
Immature Granulocytes: 0 %
Lymphocytes Relative: 21 %
Lymphs Abs: 1.1 10*3/uL (ref 0.7–4.0)
MCH: 31.3 pg (ref 26.0–34.0)
MCHC: 31.2 g/dL (ref 30.0–36.0)
MCV: 100.2 fL — AB (ref 80.0–100.0)
Monocytes Absolute: 0.7 10*3/uL (ref 0.1–1.0)
Monocytes Relative: 13 %
Neutro Abs: 3.1 10*3/uL (ref 1.7–7.7)
Neutrophils Relative %: 62 %
Platelets: 139 10*3/uL — ABNORMAL LOW (ref 150–400)
RBC: 4.41 MIL/uL (ref 4.22–5.81)
RDW: 15.2 % (ref 11.5–15.5)
WBC: 5 10*3/uL (ref 4.0–10.5)
nRBC: 0 % (ref 0.0–0.2)

## 2018-11-03 LAB — COMPREHENSIVE METABOLIC PANEL
ALT: 37 U/L (ref 0–44)
AST: 27 U/L (ref 15–41)
Albumin: 3.8 g/dL (ref 3.5–5.0)
Alkaline Phosphatase: 107 U/L (ref 38–126)
Anion gap: 11 (ref 5–15)
BUN: 13 mg/dL (ref 6–20)
CO2: 24 mmol/L (ref 22–32)
Calcium: 9.4 mg/dL (ref 8.9–10.3)
Chloride: 103 mmol/L (ref 98–111)
Creatinine, Ser: 0.77 mg/dL (ref 0.61–1.24)
GFR calc Af Amer: >60 mL/min (ref >60)
GFR calc non Af Amer: >60 mL/min (ref >60)
Glucose, Bld: 117 mg/dL — ABNORMAL HIGH (ref 70–99)
POTASSIUM: 4.8 mmol/L (ref 3.5–5.1)
Sodium: 138 mmol/L (ref 135–145)
Total Bilirubin: 0.5 mg/dL (ref 0.3–1.2)
Total Protein: 6.6 g/dL (ref 6.5–8.1)

## 2018-11-03 LAB — ABO/RH: ABO/RH(D): A POS

## 2018-11-03 LAB — PREPARE RBC (CROSSMATCH)

## 2018-11-03 LAB — HEMOGLOBIN A1C
Hgb A1c MFr Bld: 5.6 % (ref 4.8–5.6)
MEAN PLASMA GLUCOSE: 114.02 mg/dL

## 2018-11-03 MED ORDER — ENOXAPARIN SODIUM 120 MG/0.8ML ~~LOC~~ SOLN: 120.0000 mg | SUBCUTANEOUS | 0 refills | Status: DC

## 2018-11-03 NOTE — Telephone Encounter (Signed)
No los per 3/27

## 2018-11-03 NOTE — Progress Notes (Signed)
PCP - Dr. Christa See Cardiologist - denies  Chest x-ray - 07/07/18 EKG - 07/07/18 Stress Test - denies ECHO - denies Cardiac Cath - denies  Sleep Study - denies  Blood Thinner Instructions: patient given instructions per surgeon to hold Xarelto beginning 11/05/18, will do Lovenox bridge. Aspirin Instructions: Patient instructed to hold all Aspirin, NSAID's, herbal medications, fish oil and vitamins 7 days prior to surgery.   Patient given #3 bottles of Ensure presurgery drink; instructions to drink 2 bottles by 10pm 11/09/18, drink 1 bottle by 0630 on 10/10/18.  Anesthesia review:   Patient denies shortness of breath, fever, cough and chest pain at PAT appointment   Patient verbalized understanding of instructions that were given to them at the PAT appointment. Patient was also instructed that they will need to review over the PAT instructions again at home before surgery.

## 2018-11-03 NOTE — Telephone Encounter (Signed)
Spoke with Wynn Banker, Dr. Burr Medico will send in script for Lovenox injections to be given on 4/2, 4/3, 4/4 none on 4/5 and surgery 4/6.  She understands he will stop the Xarelto on 4/1. A neighbor who is a Marine scientist will administer the injections.

## 2018-11-05 ENCOUNTER — Encounter: Payer: Self-pay | Admitting: General Practice

## 2018-11-05 NOTE — Progress Notes (Signed)
[facility] Team contacted patient to assess for food insecurity and other psychosocial needs during current COVID19 pandemic.  Appears to be inpatient at this time, left generic VM w contact information and encouragement to call back.   Beverely Pace, [place]

## 2018-11-05 NOTE — H&P (Signed)
[name] Documented: 10/27/2018 10:42 AM Location: [facility] Patient #: [id number] DOB: 04/08/1961 Married / Language: English / Race: Refused to Report/Unreported Male   History of Present Illness Stark Klein MD; 10/27/2018 12:06 PM) The patient is a 58 year old male who presents for a follow-up for Pancreatic cancer. Patient is a 58 year old male that was referred to the acute care general surgery service at [facility] in early November when the patient presented with obstructive jaundice. The patient had significant epigastric and right upper quadrant pain. His bilirubin was found to be elevated and imaging showed dilated intra-and extrahepatic bile ducts. He underwent ERCP with stenting as well as an endoscopic ultrasound. Imaging and EUS demonstrated approximately a 2.5 cm pancreatic head mass with invasion into around 40% of the portal vein circumference and invasion at the porta splenic confluence.  The patient is feeling significantly better post stenting. His urine and stool color has returned to normal. He is no longer requiring ibuprofen for discomfort. He denies diarrhea or issues with blood sugar. He has been able to work. He does work in [employer]. He still feels a bit fatigued. He thinks his appetite has returned to normal and he is no longer losing weight. He had no personal history of cancer and no family history of cancer of which he is aware.   The patient has been undergoing neoadjuvant chemotherapy. He has had significant decrease in his pancreatic mass and decrease in tumor marker. He has had a little bit of up-and-down with his weight but his weight is stable. He is eating well. His taste buds are starting to come back. He just had his recent post chemo scans. He did get a clot in his left subclavian vein with his port. He started on xarelto and his left arm swelling has resolved.   MR abd 10/24/2018 The patient has been undergoing  neoadjuvant chemotherapy. He has had significant decrease in his pancreatic mass and decrease in tumor marker. He has had a little bit of up-and-down with his weight but his weight is stable. He is eating well. His taste buds are starting to come back. He just had his recent post chemo scans.  MRA chest 09/16/2018 IMPRESSION: VASCULAR  1. 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 2. Left subclavian port catheter to the SVC. 3. SVC is patent   Allergies Nance Pew, CMA; 10/27/2018 10:44 AM) No Known Drug Allergies [06/27/2018]: Allergies Reconciled   Medication History Nance Pew, CMA; 4/40/1027 25:36 AM) Folic Acid (1MG  Tablet, Oral) Active. NexIUM 24HR (20MG  Capsule DR, Oral) Active. traMADol HCl (50MG  Tablet, Oral) Active. Multi-Vitamin (Oral) Active. Omeprazole (20MG  Capsule DR, Oral) Active. Xarelto (20MG  Tablet, Oral) Active. Ondansetron HCl (8MG  Tablet, Oral) Active. LORazepam (1MG  Tablet, Oral) Active. Medications Reconciled    Review of Systems Stark Klein MD; 10/27/2018 12:06 PM) All other systems negative  Vitals (Sabrina Canty CMA; 10/27/2018 10:45 AM) 10/27/2018 10:44 AM Weight: 202.25 lb Height: 72in Body Surface Area: 2.14 m Body Mass Index: 27.43 kg/m  Temp.: 97.84F(Temporal)  Pulse: 97 (Regular)  P.OX: 90% (Room air) BP: 126/72 (Sitting, Left Arm, Standard)       Physical Exam Stark Klein MD; 10/27/2018 12:06 PM) General Mental Status-Alert. General Appearance-Consistent with stated age. Hydration-Well hydrated. Voice-Normal.  Head and Neck Head-normocephalic, atraumatic with no lesions or palpable masses.  Eye Sclera/Conjunctiva - Bilateral-No scleral icterus.  Chest and Lung Exam Chest and lung exam reveals -quiet, even and easy  respiratory effort with no use of accessory muscles. Inspection Chest Wall - Normal. Back - normal.  Breast  - Did not examine.  Cardiovascular Cardiovascular examination reveals -normal pedal pulses bilaterally. Note: regular rate and rhythm  Abdomen Inspection-Inspection Normal. Palpation/Percussion Palpation and Percussion of the abdomen reveal - Soft, Non Tender, No Rebound tenderness, No Rigidity (guarding) and No hepatosplenomegaly.  Peripheral Vascular Upper Extremity Inspection - Bilateral - Normal - No Clubbing, No Cyanosis, No Edema, Pulses Intact. Lower Extremity Palpation - Edema - Bilateral - No edema.  Neurologic Neurologic evaluation reveals -alert and oriented x 3 with no impairment of recent or remote memory. Mental Status-Normal.  Musculoskeletal Global Assessment -Note: no gross deformities.  Normal Exam - Left-Upper Extremity Strength Normal and Lower Extremity Strength Normal. Normal Exam - Right-Upper Extremity Strength Normal and Lower Extremity Strength Normal.  Lymphatic Head & Neck  General Head & Neck Lymphatics: Bilateral - Description - Normal. Axillary  General Axillary Region: Bilateral - Description - Normal. Tenderness - Non Tender.    Assessment & Plan Stark Klein MD; 10/27/2018 12:10 PM) PRIMARY ADENOCARCINOMA OF HEAD OF PANCREAS (C25.0) Impression: Significant improvement in mass.  Will plan dx laparoscopy and whipple. Will remove port at the same time.  I discussed risk of covert exposure in the hospital and in the ICU. However, if the patient does not have potentially curative resection, he will definitely pass away from his pancreatic cancer.  I discussed the surgery with the patient including diagrams of anatomy. I discussed the potential for diagnostic laparoscopy. In the case of pancreatic cancer, if spread of the disease is found, we will abort the procedure and not proceed with resection. The rationale for this was discussed with the patient. There has not been data to support resection of Stage IV disease in terms of  survival benefit.  We discussed possible complications including: Potential of aborting procedure if tumor is invading the superior mesenteric or hepatic arteries Bleeding Infection and possible wound complications such as hernia Damage to adjacent structures Leak of anastamoses, primarily pancreatic Possible need for other procedures Possible prolonged nausea with possible need for external feeding. Possible prolonged hospital stay. Possible development of diabetes or worsening of current diabetes. Possible pancreatic exocrine insufficiency Prolonged fatigue/weakness/appetite Possible early recurrence of cancer   The patient understands and wishes to proceed. The patient has been advised to turn in disability paperwork to our office. Current Plans You are being scheduled for surgery- Our schedulers will call you.  You should hear from our office's scheduling department within 5 working days about the location, date, and time of surgery. We try to make accommodations for patient's preferences in scheduling surgery, but sometimes the OR schedule or the surgeon's schedule prevents [name] from making those accommodations.  If you have not heard from our office [phone]) in 5 working days, call the office and ask for your surgeon's nurse.  If you have other questions about your diagnosis, plan, or surgery, call the office and ask for your surgeon's nurse.  Pt Education - flb whipple pt info Pt Education - [facility] Colon Bowel Prep 2018 ERAS/Miralax/Antibiotics   Signed by Stark Klein, MD (10/27/2018 12:11 PM)

## 2018-11-10 ENCOUNTER — Ambulatory Visit (HOSPITAL_COMMUNITY)
Admission: RE | Admit: 2018-11-10 | Discharge: 2018-11-10 | Disposition: A | Discharge status: Home / Self Care | Payer: Managed Care, Other (non HMO) | Attending: General Surgery | Admitting: General Surgery

## 2018-11-10 ENCOUNTER — Inpatient Hospital Stay (HOSPITAL_COMMUNITY): Payer: Managed Care, Other (non HMO) | Admitting: Certified Registered Nurse Anesthetist

## 2018-11-10 ENCOUNTER — Encounter (HOSPITAL_COMMUNITY)
Admission: RE | Disposition: A | Discharge status: Home / Self Care | Payer: Self-pay | Source: Home / Self Care | Attending: General Surgery

## 2018-11-10 ENCOUNTER — Encounter (HOSPITAL_COMMUNITY): Payer: Self-pay

## 2018-11-10 ENCOUNTER — Other Ambulatory Visit: Payer: Self-pay

## 2018-11-10 DIAGNOSIS — Z7901 Long term (current) use of anticoagulants: Secondary | ICD-10-CM | POA: Diagnosis not present

## 2018-11-10 DIAGNOSIS — K219 Gastro-esophageal reflux disease without esophagitis: Secondary | ICD-10-CM | POA: Insufficient documentation

## 2018-11-10 DIAGNOSIS — Z79899 Other long term (current) drug therapy: Secondary | ICD-10-CM | POA: Insufficient documentation

## 2018-11-10 DIAGNOSIS — F419 Anxiety disorder, unspecified: Secondary | ICD-10-CM | POA: Diagnosis not present

## 2018-11-10 DIAGNOSIS — F17200 Nicotine dependence, unspecified, uncomplicated: Secondary | ICD-10-CM | POA: Insufficient documentation

## 2018-11-10 DIAGNOSIS — T82868A Thrombosis of vascular prosthetic devices, implants and grafts, initial encounter: Secondary | ICD-10-CM | POA: Diagnosis not present

## 2018-11-10 DIAGNOSIS — K8010 Calculus of gallbladder with chronic cholecystitis without obstruction: Secondary | ICD-10-CM | POA: Insufficient documentation

## 2018-11-10 DIAGNOSIS — K8681 Exocrine pancreatic insufficiency: Secondary | ICD-10-CM | POA: Diagnosis not present

## 2018-11-10 DIAGNOSIS — Z452 Encounter for adjustment and management of vascular access device: Secondary | ICD-10-CM | POA: Diagnosis present

## 2018-11-10 DIAGNOSIS — C250 Malignant neoplasm of head of pancreas: Secondary | ICD-10-CM | POA: Diagnosis present

## 2018-11-10 DIAGNOSIS — C787 Secondary malignant neoplasm of liver and intrahepatic bile duct: Secondary | ICD-10-CM | POA: Insufficient documentation

## 2018-11-10 DIAGNOSIS — Y718 Miscellaneous cardiovascular devices associated with adverse incidents, not elsewhere classified: Secondary | ICD-10-CM | POA: Insufficient documentation

## 2018-11-10 HISTORY — PX: PORT-A-CATH REMOVAL: SHX5289

## 2018-11-10 HISTORY — PX: DIAGNOSTIC LAPAROSCOPIC LIVER BIOPSY: SHX5797

## 2018-11-10 HISTORY — PX: CHOLECYSTECTOMY: SHX55

## 2018-11-10 LAB — PROTIME-INR
INR: 0.9 (ref 0.8–1.2)
Prothrombin Time: 12.5 seconds (ref 11.4–15.2)

## 2018-11-10 LAB — GLUCOSE, CAPILLARY: Glucose-Capillary: 109 mg/dL — ABNORMAL HIGH (ref 70–99)

## 2018-11-10 SURGERY — REMOVAL PORT-A-CATH
Anesthesia: General | Site: Chest

## 2018-11-10 MED ORDER — OXYCODONE HCL 5 MG PO TABS
ORAL_TABLET | ORAL | Status: AC
  Filled 2018-11-10: qty 2

## 2018-11-10 MED ORDER — OXYCODONE HCL 5 MG PO TABS
5.0000 mg | ORAL_TABLET | Freq: Once | ORAL | Status: AC
  Administered 2018-11-10: 5 mg via ORAL

## 2018-11-10 MED ORDER — GABAPENTIN 300 MG PO CAPS
300.0000 mg | ORAL_CAPSULE | ORAL | Status: AC
  Administered 2018-11-10: 08:00:00 300 mg via ORAL

## 2018-11-10 MED ORDER — MIDAZOLAM HCL 5 MG/5ML IJ SOLN: INTRAMUSCULAR | Status: DC | PRN

## 2018-11-10 MED ORDER — MIDAZOLAM HCL 5 MG/5ML IJ SOLN
INTRAMUSCULAR | Status: DC | PRN
  Administered 2018-11-10: 2 mg via INTRAVENOUS

## 2018-11-10 MED ORDER — WATER FOR IRRIGATION, STERILE IR SOLN
Status: DC | PRN
  Administered 2018-11-10: 1000 mL

## 2018-11-10 MED ORDER — PROPOFOL 10 MG/ML IV BOLUS
INTRAVENOUS | Status: AC
  Filled 2018-11-10: qty 20

## 2018-11-10 MED ORDER — SCOPOLAMINE 1 MG/3DAYS TD PT72
MEDICATED_PATCH | TRANSDERMAL | Status: AC
  Administered 2018-11-10: 08:00:00 1.5 mg via TRANSDERMAL
  Filled 2018-11-10: qty 1

## 2018-11-10 MED ORDER — ROCURONIUM BROMIDE 50 MG/5ML IV SOSY
PREFILLED_SYRINGE | INTRAVENOUS | Status: AC
  Filled 2018-11-10: qty 5

## 2018-11-10 MED ORDER — KETAMINE HCL 50 MG/5ML IJ SOSY
PREFILLED_SYRINGE | INTRAMUSCULAR | Status: AC
  Filled 2018-11-10: qty 10

## 2018-11-10 MED ORDER — FENTANYL CITRATE (PF) 100 MCG/2ML IJ SOLN
INTRAMUSCULAR | Status: DC | PRN
  Administered 2018-11-10 (×3): 25 ug via INTRAVENOUS
  Administered 2018-11-10: 150 ug via INTRAVENOUS
  Administered 2018-11-10: 25 ug via INTRAVENOUS

## 2018-11-10 MED ORDER — FENTANYL CITRATE (PF) 250 MCG/5ML IJ SOLN
INTRAMUSCULAR | Status: AC
  Filled 2018-11-10: qty 5

## 2018-11-10 MED ORDER — ONDANSETRON HCL 4 MG/2ML IJ SOLN: 4.0000 mg | Freq: Four times a day (QID) | INTRAMUSCULAR | Status: DC | PRN

## 2018-11-10 MED ORDER — CHLORHEXIDINE GLUCONATE CLOTH 2 % EX PADS: 6.0000 | MEDICATED_PAD | Freq: Once | CUTANEOUS | Status: DC

## 2018-11-10 MED ORDER — CEFAZOLIN SODIUM-DEXTROSE 2-4 GM/100ML-% IV SOLN
INTRAVENOUS | Status: AC
  Filled 2018-11-10: qty 100

## 2018-11-10 MED ORDER — SCOPOLAMINE 1 MG/3DAYS TD PT72
1.0000 | MEDICATED_PATCH | TRANSDERMAL | Status: DC
  Administered 2018-11-10: 08:00:00 1.5 mg via TRANSDERMAL

## 2018-11-10 MED ORDER — SODIUM CHLORIDE 0.9 % IV SOLN
INTRAVENOUS | Status: DC | PRN
  Administered 2018-11-10: 20 ug/min via INTRAVENOUS

## 2018-11-10 MED ORDER — FENTANYL CITRATE (PF) 100 MCG/2ML IJ SOLN
INTRAMUSCULAR | Status: AC
  Filled 2018-11-10: qty 2

## 2018-11-10 MED ORDER — ACETAMINOPHEN 500 MG PO TABS
ORAL_TABLET | ORAL | Status: AC
  Administered 2018-11-10: 08:00:00 1000 mg via ORAL
  Filled 2018-11-10: qty 2

## 2018-11-10 MED ORDER — ENSURE PRE-SURGERY PO LIQD: 296.0000 mL | Freq: Once | ORAL | Status: DC

## 2018-11-10 MED ORDER — SUCCINYLCHOLINE CHLORIDE 200 MG/10ML IV SOSY
PREFILLED_SYRINGE | INTRAVENOUS | Status: AC
  Filled 2018-11-10: qty 10

## 2018-11-10 MED ORDER — MIDAZOLAM HCL 2 MG/2ML IJ SOLN
INTRAMUSCULAR | Status: AC
  Filled 2018-11-10: qty 2

## 2018-11-10 MED ORDER — LIDOCAINE 2% (20 MG/ML) 5 ML SYRINGE
INTRAMUSCULAR | Status: DC | PRN
  Administered 2018-11-10: 60 mg via INTRAVENOUS
  Administered 2018-11-10: 40 mg via INTRAVENOUS

## 2018-11-10 MED ORDER — LACTATED RINGERS IV SOLN
INTRAVENOUS | Status: DC | PRN
  Administered 2018-11-10: 10:00:00 via INTRAVENOUS

## 2018-11-10 MED ORDER — ENSURE PRE-SURGERY PO LIQD
592.0000 mL | Freq: Once | ORAL | Status: DC
  Filled 2018-11-10: qty 592

## 2018-11-10 MED ORDER — DEXAMETHASONE SODIUM PHOSPHATE 10 MG/ML IJ SOLN
INTRAMUSCULAR | Status: DC | PRN
  Administered 2018-11-10: 8 mg via INTRAVENOUS

## 2018-11-10 MED ORDER — SUGAMMADEX SODIUM 200 MG/2ML IV SOLN
INTRAVENOUS | Status: DC | PRN
  Administered 2018-11-10: 200 mg via INTRAVENOUS

## 2018-11-10 MED ORDER — OXYCODONE HCL 5 MG/5ML PO SOLN: 5.0000 mg | Freq: Once | ORAL | Status: AC | PRN

## 2018-11-10 MED ORDER — ROCURONIUM BROMIDE 10 MG/ML (PF) SYRINGE
PREFILLED_SYRINGE | INTRAVENOUS | Status: DC | PRN
  Administered 2018-11-10: 50 mg via INTRAVENOUS
  Administered 2018-11-10: 20 mg via INTRAVENOUS
  Administered 2018-11-10: 10 mg via INTRAVENOUS

## 2018-11-10 MED ORDER — 0.9 % SODIUM CHLORIDE (POUR BTL) OPTIME
TOPICAL | Status: DC | PRN
  Administered 2018-11-10 (×2): 1000 mL

## 2018-11-10 MED ORDER — ACETAMINOPHEN 500 MG PO TABS
1000.0000 mg | ORAL_TABLET | ORAL | Status: AC
  Administered 2018-11-10: 08:00:00 1000 mg via ORAL

## 2018-11-10 MED ORDER — CEFAZOLIN SODIUM-DEXTROSE 2-4 GM/100ML-% IV SOLN
2.0000 g | INTRAVENOUS | Status: AC
  Administered 2018-11-10: 10:00:00 2 g via INTRAVENOUS

## 2018-11-10 MED ORDER — LIDOCAINE HCL (PF) 1 % IJ SOLN
INTRAMUSCULAR | Status: AC
  Filled 2018-11-10: qty 30

## 2018-11-10 MED ORDER — KETAMINE HCL 10 MG/ML IJ SOLN
INTRAMUSCULAR | Status: DC | PRN
  Administered 2018-11-10 (×2): 25 mg via INTRAVENOUS

## 2018-11-10 MED ORDER — DEXAMETHASONE SODIUM PHOSPHATE 10 MG/ML IJ SOLN
INTRAMUSCULAR | Status: AC
  Filled 2018-11-10: qty 1

## 2018-11-10 MED ORDER — BUPIVACAINE-EPINEPHRINE (PF) 0.25% -1:200000 IJ SOLN
INTRAMUSCULAR | Status: AC
  Filled 2018-11-10: qty 30

## 2018-11-10 MED ORDER — LIDOCAINE HCL 1 % IJ SOLN
INTRAMUSCULAR | Status: DC | PRN
  Administered 2018-11-10: 18 mL via INTRAMUSCULAR

## 2018-11-10 MED ORDER — SUCCINYLCHOLINE CHLORIDE 20 MG/ML IJ SOLN
INTRAMUSCULAR | Status: DC | PRN
  Administered 2018-11-10: 160 mg via INTRAVENOUS

## 2018-11-10 MED ORDER — LIDOCAINE 2% (20 MG/ML) 5 ML SYRINGE
INTRAMUSCULAR | Status: AC
  Filled 2018-11-10: qty 5

## 2018-11-10 MED ORDER — OXYCODONE HCL 5 MG PO TABS: 5.0000 mg | ORAL_TABLET | Freq: Four times a day (QID) | ORAL | 0 refills | Status: DC | PRN

## 2018-11-10 MED ORDER — ESMOLOL HCL 100 MG/10ML IV SOLN
INTRAVENOUS | Status: DC | PRN
  Administered 2018-11-10 (×2): 50 mg via INTRAVENOUS
  Administered 2018-11-10: 20 mg via INTRAVENOUS
  Administered 2018-11-10: 30 mg via INTRAVENOUS

## 2018-11-10 MED ORDER — SODIUM CHLORIDE 0.9 % IR SOLN
Status: DC | PRN
  Administered 2018-11-10: 1000 mL

## 2018-11-10 MED ORDER — ONDANSETRON HCL 4 MG/2ML IJ SOLN
INTRAMUSCULAR | Status: DC | PRN
  Administered 2018-11-10: 4 mg via INTRAVENOUS

## 2018-11-10 MED ORDER — LACTATED RINGERS IV SOLN
INTRAVENOUS | Status: DC
  Administered 2018-11-10: 08:00:00 via INTRAVENOUS

## 2018-11-10 MED ORDER — PROPOFOL 10 MG/ML IV BOLUS
INTRAVENOUS | Status: DC | PRN
  Administered 2018-11-10: 200 mg via INTRAVENOUS

## 2018-11-10 MED ORDER — FENTANYL CITRATE (PF) 100 MCG/2ML IJ SOLN: 25.0000 ug | INTRAMUSCULAR | Status: DC | PRN

## 2018-11-10 MED ORDER — GABAPENTIN 300 MG PO CAPS
ORAL_CAPSULE | ORAL | Status: AC
  Administered 2018-11-10: 300 mg via ORAL
  Filled 2018-11-10: qty 1

## 2018-11-10 MED ORDER — ONDANSETRON HCL 4 MG/2ML IJ SOLN
INTRAMUSCULAR | Status: AC
  Filled 2018-11-10: qty 2

## 2018-11-10 MED ORDER — OXYCODONE HCL 5 MG PO TABS
5.0000 mg | ORAL_TABLET | Freq: Once | ORAL | Status: AC | PRN
  Administered 2018-11-10: 13:00:00 5 mg via ORAL

## 2018-11-10 SURGICAL SUPPLY — 81 items
BLADE CLIPPER SURG (BLADE) ×5 IMPLANT
BLADE SURG 11 STRL SS (BLADE) ×5 IMPLANT
BOOT SUTURE AID YELLOW STND (SUTURE) ×5 IMPLANT
CANISTER SUCT 3000ML PPV (MISCELLANEOUS) ×10 IMPLANT
CHLORAPREP W/TINT 26 (MISCELLANEOUS) ×5 IMPLANT
CLIP VESOCCLUDE LG 6/CT (CLIP) ×5 IMPLANT
CLIP VESOCCLUDE MED 24/CT (CLIP) ×5 IMPLANT
CLIP VESOLOCK LG 6/CT PURPLE (CLIP) ×5 IMPLANT
CLIP VESOLOCK MED 6/CT (CLIP) ×5 IMPLANT
CLIP VESOLOCK MED LG 6/CT (CLIP) ×5 IMPLANT
CONT SPEC 4OZ CLIKSEAL STRL BL (MISCELLANEOUS) ×5 IMPLANT
COVER MAYO STAND STRL (DRAPES) ×5 IMPLANT
COVER SURGICAL LIGHT HANDLE (MISCELLANEOUS) ×5 IMPLANT
DECANTER SPIKE VIAL GLASS SM (MISCELLANEOUS) ×10 IMPLANT
DERMABOND ADVANCED (GAUZE/BANDAGES/DRESSINGS) ×4
DERMABOND ADVANCED .7 DNX12 (GAUZE/BANDAGES/DRESSINGS) ×6 IMPLANT
DRAPE UNIVERSAL PACK (DRAPES) ×5 IMPLANT
DRAPE UTILITY XL STRL (DRAPES) ×5 IMPLANT
DRAPE WARM FLUID 44X44 (DRAPE) ×5 IMPLANT
DRSG TELFA 3X8 NADH (GAUZE/BANDAGES/DRESSINGS) ×5 IMPLANT
ELECT BLADE 4.0 EZ CLEAN MEGAD (MISCELLANEOUS) ×5
ELECT BLADE 6.5 EXT (BLADE) ×5 IMPLANT
ELECT CAUTERY BLADE 6.4 (BLADE) ×5 IMPLANT
ELECT REM PT RETURN 9FT ADLT (ELECTROSURGICAL) ×5
ELECTRODE BLDE 4.0 EZ CLN MEGD (MISCELLANEOUS) ×3 IMPLANT
ELECTRODE REM PT RTRN 9FT ADLT (ELECTROSURGICAL) ×3 IMPLANT
ENDOLOOP SUT PDS II  0 18 (SUTURE) ×4
ENDOLOOP SUT PDS II 0 18 (SUTURE) ×6 IMPLANT
GAUZE 4X4 16PLY RFD (DISPOSABLE) ×5 IMPLANT
GEL ULTRASOUND 20GR AQUASONIC (MISCELLANEOUS) ×5 IMPLANT
GLOVE BIO SURGEON STRL SZ 6 (GLOVE) ×10 IMPLANT
GLOVE BIOGEL PI IND STRL 6.5 (GLOVE) ×9 IMPLANT
GLOVE BIOGEL PI INDICATOR 6.5 (GLOVE) ×6
GLOVE ECLIPSE 6.5 STRL STRAW (GLOVE) ×5 IMPLANT
GLOVE INDICATOR 6.5 STRL GRN (GLOVE) ×10 IMPLANT
GLOVE SURG SIGNA 7.5 PF LTX (GLOVE) ×5 IMPLANT
GLOVE SURG SS PI 6.5 STRL IVOR (GLOVE) ×10 IMPLANT
GOWN STRL REUS W/ TWL LRG LVL3 (GOWN DISPOSABLE) ×9 IMPLANT
GOWN STRL REUS W/ TWL XL LVL3 (GOWN DISPOSABLE) ×3 IMPLANT
GOWN STRL REUS W/TWL 2XL LVL3 (GOWN DISPOSABLE) ×10 IMPLANT
GOWN STRL REUS W/TWL LRG LVL3 (GOWN DISPOSABLE) ×6
GOWN STRL REUS W/TWL XL LVL3 (GOWN DISPOSABLE) ×2
KIT BASIN OR (CUSTOM PROCEDURE TRAY) ×5 IMPLANT
KIT TURNOVER KIT B (KITS) ×5 IMPLANT
L-HOOK LAP DISP 36CM (ELECTROSURGICAL) ×5
LHOOK LAP DISP 36CM (ELECTROSURGICAL) ×3 IMPLANT
LOOP VESSEL MAXI BLUE (MISCELLANEOUS) ×5 IMPLANT
LOOP VESSEL MINI RED (MISCELLANEOUS) ×5 IMPLANT
NEEDLE HYPO 25GX1X1/2 BEV (NEEDLE) ×5 IMPLANT
NS IRRIG 1000ML POUR BTL (IV SOLUTION) ×10 IMPLANT
PACK GENERAL/GYN (CUSTOM PROCEDURE TRAY) ×5 IMPLANT
PAD ARMBOARD 7.5X6 YLW CONV (MISCELLANEOUS) ×10 IMPLANT
PAD SHARPS MAGNETIC DISPOSAL (MISCELLANEOUS) ×5 IMPLANT
PENCIL SMOKE EVACUATOR (MISCELLANEOUS) ×5 IMPLANT
POUCH RETRIEVAL ECOSAC 10 (ENDOMECHANICALS) ×3 IMPLANT
POUCH RETRIEVAL ECOSAC 10MM (ENDOMECHANICALS) ×2
SCISSORS LAP 5X35 DISP (ENDOMECHANICALS) ×5 IMPLANT
SET IRRIG TUBING LAPAROSCOPIC (IRRIGATION / IRRIGATOR) ×5 IMPLANT
SET TUBE SMOKE EVAC HIGH FLOW (TUBING) ×5 IMPLANT
SHEARS FOC LG CVD HARMONIC 17C (MISCELLANEOUS) ×5 IMPLANT
SLEEVE ENDOPATH XCEL 5M (ENDOMECHANICALS) ×15 IMPLANT
SLEEVE SUCTION 125 (MISCELLANEOUS) ×5 IMPLANT
SLEEVE SUCTION CATH 165 (SLEEVE) ×5 IMPLANT
SOLUTION ANTI FOG 6CC (MISCELLANEOUS) ×5 IMPLANT
SPONGE INTESTINAL PEANUT (DISPOSABLE) ×5 IMPLANT
SPONGE LAP 18X18 RF (DISPOSABLE) ×10 IMPLANT
SUCTION POOLE TIP (SUCTIONS) ×5 IMPLANT
SUT MNCRL AB 4-0 PS2 18 (SUTURE) ×10 IMPLANT
SUT SILK 2 0 SH CR/8 (SUTURE) ×5 IMPLANT
SUT SILK 2 0 TIES 10X30 (SUTURE) ×5 IMPLANT
SUT SILK 3 0 SH CR/8 (SUTURE) ×5 IMPLANT
SUT SILK 3 0 TIES 10X30 (SUTURE) ×5 IMPLANT
SUT VIC AB 3-0 SH 27 (SUTURE) ×2
SUT VIC AB 3-0 SH 27X BRD (SUTURE) ×3 IMPLANT
SUT VICRYL 0 UR6 27IN ABS (SUTURE) ×5 IMPLANT
SYR CONTROL 10ML LL (SYRINGE) ×5 IMPLANT
TAPE UMBILICAL 1/8 X36 TWILL (MISCELLANEOUS) ×5 IMPLANT
TOWEL GREEN STERILE FF (TOWEL DISPOSABLE) ×5 IMPLANT
TRAY FOLEY MTR SLVR 14FR STAT (SET/KITS/TRAYS/PACK) ×5 IMPLANT
TROCAR XCEL 12X100 BLDLESS (ENDOMECHANICALS) ×5 IMPLANT
TROCAR XCEL NON-BLD 5MMX100MML (ENDOMECHANICALS) ×5 IMPLANT

## 2018-11-10 NOTE — Op Note (Signed)
PRE-OPERATIVE DIAGNOSIS:  Subclavian clot, adenocarcinoma of the pancreatic head.  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    PROCEDURE:  Procedure(s):  REMOVAL PORT-A-CATH, diagnostic laparoscopy, laparoscopic cholecystectomy  SURGEON:  Surgeon(s):  Stark Klein, MD  ASSISTANT:  Alphonsa Overall, MD   ANESTHESIA:   General + local  EBL:   Minimal  SPECIMEN:  None  Complications : none known  Procedure:   Pt was  identified in the holding area and taken to the operating room where he was placed supine on the operating room table.  General anesthesia was induced.  A foley catheter was placed, the left arm was tucked, and SCDs were placed.    The abdomen and left chest were prepped and draped in sterile fashion.  The port removal was addressed first.  The prior incision was anesthetized with local anesthetic.  The incision was opened with a #15 blade.  The subcutaneous tissue was divided with the cautery.  The port was identified and the capsule opened.  The four 2-0 prolene sutures were removed.  The port was then removed and pressure held on the tract.  The catheter appeared intact without evidence of breakage, length was 23.5 cm.  The wound was inspected for hemostasis, which was achieved with cautery.  The wound was closed with 3-0 vicryl deep dermal interrupted sutures and 4-0 Monocryl running subcuticular suture.  The wound was cleaned, dried, and dressed with dermabond.    The patient was then placed into reverse trendelenburg position and rotated to the right. The left subcostal margin was anesthetized with local anesthesia.  A 5 mm optiview trocar was placed under direct visualization.  The abdomen was insufflated with carbon dioxide.  The abdomen was examined.  A second port was placed in the upper midline to be able to better visualize the right liver.  A plaque was seen on the anterior left lateral segment.  This felt soft, but given the pathology,  the laparoscopic biopsy forceps were used to take the plaque off the liver capsule.  This appeared benign, but was sent for frozen.    Given the appearance, I decided to address the gallbladder which was chronically infected.  Two right lateral ports were placed at potential future drain locations.  The gallbladder was identified, the fundus grasped and retracted cephalad. Adhesions were lysed bluntly and with the electrocautery where indicated, taking care not to injure any adjacent organs or viscus. The infundibulum was grasped and retracted laterally, exposing the peritoneum overlying the triangle of Calot. This was then divided and exposed in a blunt fashion. A critical view of the cystic duct and cystic artery was obtained.  The cystic duct was clearly identified and bluntly dissected circumferentially. The cystic duct was ligated with a clip distally.  The cystic duct was then ligated with clips and divided. The cystic artery was identified, dissected free, ligated with clips and divided as well. The cystic duct was just about as wide as the clips, so two Endoloops were placed around the cystic duct remnant.    The gallbladder was dissected from the liver bed in retrograde fashion with the electrocautery. The gallbladder was removed and placed in an Ecosac bag.  The gallbladder and bag were then removed through the umbilical port site.  The liver bed was irrigated and inspected. Hemostasis was achieved with the electrocautery. Copious irrigation was utilized and was repeatedly aspirated until clear.    We again inspected the right upper quadrant as well as  the location of the liver biopsy for hemostasis.  Pneumoperitoneum was released as we removed the trocars.    The patient was awakened from anesthesia and taken to the PACU in stable condition.  Needle, sponge, and instrument counts are correct.

## 2018-11-10 NOTE — Anesthesia Postprocedure Evaluation (Signed)
Anesthesia Post Note  Patient: Ernest Wu  Procedure(s) Performed: REMOVAL PORT-A-CATH (N/A Chest) Laparoscopic Cholecystectomy (N/A Abdomen) Diagnostic Laparoscopic Liver Biopsy (N/A Abdomen)     Patient location during evaluation: PACU Anesthesia Type: General Level of consciousness: awake and alert Pain management: pain level controlled Vital Signs Assessment: post-procedure vital signs reviewed and stable Respiratory status: spontaneous breathing, nonlabored ventilation, respiratory function stable and patient connected to nasal cannula oxygen Cardiovascular status: blood pressure returned to baseline and stable Postop Assessment: no apparent nausea or vomiting Anesthetic complications: no Comments: Epidural catheter was removed in PACU.  Catheter fully intact.    Last Vitals:  Vitals:   11/10/18 1158 11/10/18 1220  BP: 134/82   Pulse: 71 69  Resp: 16 17  Temp:    SpO2: 93% 95%    Last Pain:  Vitals:   11/10/18 1142  TempSrc:   PainSc: [place]

## 2018-11-10 NOTE — Anesthesia Procedure Notes (Signed)
Arterial Line Insertion Start/End4/01/2019 8:45 AM, 11/10/2018 9:05 AM Performed by: Albertha Ghee, MD, Wilburn Cornelia, CRNA, CRNA  Patient location: Pre-op. Preanesthetic checklist: patient identified, IV checked, site marked, risks and benefits discussed, surgical consent, monitors and equipment checked, pre-op evaluation, timeout performed and anesthesia consent Lidocaine 1% used for infiltration radial was placed Catheter size: 20 G Hand hygiene performed , maximum sterile barriers used  and Seldinger technique used Allen's test indicative of satisfactory collateral circulation Attempts: 4 Procedure performed without using ultrasound guided technique. Following insertion, dressing applied. Post procedure assessment: normal and unchanged

## 2018-11-10 NOTE — Progress Notes (Signed)
Dr Barry Dienes at bedside to explain to pt of her findings/ shared she removed his gallbladde, but pancreas remains d/t findings of cancer involving his liver.

## 2018-11-10 NOTE — Anesthesia Procedure Notes (Signed)
Procedure Name: Intubation Performed by: Milford Cage, CRNA Pre-anesthesia Checklist: Patient identified, Emergency Drugs available, Suction available and Patient being monitored Patient Re-evaluated:Patient Re-evaluated prior to induction Oxygen Delivery Method: Circle System Utilized Preoxygenation: Pre-oxygenation with 100% oxygen Induction Type: IV induction and Rapid sequence Laryngoscope Size: Mac and 4 Grade View: Grade II Tube type: Oral Tube size: 7.5 mm Number of attempts: 1 Airway Equipment and Method: Stylet and Oral airway Placement Confirmation: ETT inserted through vocal cords under direct vision,  positive ETCO2 and breath sounds checked- equal and bilateral Secured at: 23 cm Tube secured with: Tape Dental Injury: Teeth and Oropharynx as per pre-operative assessment  Comments: RSI due to infection prevention

## 2018-11-10 NOTE — Progress Notes (Signed)
She also shared his PAC has been removed d/t a blood clot there

## 2018-11-10 NOTE — Interval H&P Note (Signed)
History and Physical Interval Note:  11/10/2018 8:54 AM  Ernest Wu  has presented today for surgery, with the diagnosis of pancreatic cancer.  The various methods of treatment have been discussed with the patient and family. After consideration of risks, benefits and other options for treatment, the patient has consented to  Procedure(s): LAPAROSCOPY DIAGNOSTIC (N/A) REMOVAL PORT-A-CATH (N/A) WHIPPLE PROCEDURE (N/A) as a surgical intervention.  The patient's history has been reviewed, patient examined, no change in status, stable for surgery.  I have reviewed the patient's chart and labs.  Questions were answered to the patient's satisfaction.     Stark Klein

## 2018-11-10 NOTE — Anesthesia Procedure Notes (Signed)
Epidural Patient location during procedure: pre-op Start time: 11/10/2018 8:41 AM End time: 11/10/2018 8:52 AM  Staffing Anesthesiologist: Albertha Ghee, MD Performed: anesthesiologist   Preanesthetic Checklist Completed: patient identified, site marked, pre-op evaluation, timeout performed, IV checked, risks and benefits discussed and monitors and equipment checked  Epidural Patient position: sitting Prep: DuraPrep Patient monitoring: heart rate, cardiac monitor, continuous pulse ox and blood pressure Approach: midline Location: thoracic (1-12) (T12-L1) Injection technique: LOR air  Needle:  Needle type: Tuohy  Needle gauge: 17 G Needle length: 9 cm Needle insertion depth: 5 cm Catheter type: closed end flexible Catheter size: 19 Gauge Catheter at skin depth: 12 cm Test dose: negative and Other  Assessment Events: blood not aspirated, injection not painful, no injection resistance and negative IV test  Additional Notes Informed consent obtained prior to proceeding including risk of failure, 1% risk of PDPH, risk of minor discomfort and bruising.  Discussed rare but serious complications including epidural abscess, permanent nerve injury, epidural hematoma.  Discussed alternatives to epidural analgesia and patient desires to proceed.  Timeout performed pre-procedure verifying patient name, procedure, and platelet count.  Patient tolerated procedure well.  Test dose administered (56mL of 1.5% Lidocaine +epi).  Test dose was negative. Reason for block:procedure for pain

## 2018-11-10 NOTE — Transfer of Care (Signed)
Immediate Anesthesia Transfer of Care Note  Patient: Ernest Wu  Procedure(s) Performed: REMOVAL PORT-A-CATH (N/A Chest) Laparoscopic Cholecystectomy (N/A Abdomen) Diagnostic Laparoscopic Liver Biopsy (N/A Abdomen)  Patient Location: PACU  Anesthesia Type:General  Level of Consciousness: awake  Airway & Oxygen Therapy: Patient Spontanous Breathing and Patient connected to face mask oxygen  Post-op Assessment: Report given to RN and Post -op Vital signs reviewed and stable  Post vital signs: Reviewed and stable  Last Vitals:  Vitals Value Taken Time  BP 150/88 11/10/2018 11:44 AM  Temp    Pulse 73 11/10/2018 11:46 AM  Resp 20 11/10/2018 11:46 AM  SpO2 92 % 11/10/2018 11:46 AM  Vitals shown include unvalidated device data.  Last Pain:  Vitals:   11/10/18 0800  TempSrc:   PainSc: 0-No pain      Patients Stated Pain Goal: 4 (57/97/28 2060)  Complications: No apparent anesthesia complications

## 2018-11-10 NOTE — Discharge Instructions (Addendum)
[facility] °Office Phone Number [phone] ° ° POST OP INSTRUCTIONS ° °Always review your discharge instruction sheet given to you by the facility where your surgery was performed. ° °IF YOU HAVE DISABILITY OR FAMILY LEAVE FORMS, YOU MUST BRING THEM TO THE OFFICE FOR PROCESSING.  DO NOT GIVE THEM TO YOUR DOCTOR. ° °1. A prescription for pain medication may be given to you upon discharge.  Take your pain medication as prescribed, if needed.  If narcotic pain medicine is not needed, then you may take acetaminophen (Tylenol) or ibuprofen (Advil) as needed. °2. Take your usually prescribed medications unless otherwise directed °3. If you need a refill on your pain medication, please contact your pharmacy.  They will contact our office to request authorization.  Prescriptions will not be filled after 5pm or on week-ends. °4. You should eat very light the first 24 hours after surgery, such as soup, crackers, pudding, etc.  Resume your normal diet the day after surgery °5. It is common to experience some constipation if taking pain medication after surgery.  Increasing fluid intake and taking a stool softener will usually help or prevent this problem from occurring.  A mild laxative (Milk of Magnesia or Miralax) should be taken according to package directions if there are no bowel movements after 48 hours. °6. You may shower in 48 hours.  The surgical glue will flake off in 2-3 weeks.   °7. ACTIVITIES:  No strenuous activity or heavy lifting for 1 week.   °a. You may drive when you no longer are taking prescription pain medication, you can comfortably wear a seatbelt, and you can safely maneuver your car and apply brakes. °b. RETURN TO WORK:  __________2-4 weeks if applicable.  _______________ °You should see your doctor in the office for a follow-up appointment approximately three-four weeks after your surgery.   ° °WHEN TO CALL YOUR DOCTOR: °1. Fever over 101.0 °2. Nausea and/or vomiting. °3. Extreme  swelling or bruising. °4. Continued bleeding from incision. °5. Increased pain, redness, or drainage from the incision. ° °The clinic staff is available to answer your questions during regular business hours.  Please don’t hesitate to call and ask to speak to one of the nurses for clinical concerns.  If you have a medical emergency, go to the nearest emergency room or call 911.  A surgeon from [facility] is always on call at the hospital. ° °For further questions, please visit [email or web address]  ° °

## 2018-11-10 NOTE — Anesthesia Preprocedure Evaluation (Signed)
Anesthesia Evaluation  Patient identified by MRN, date of birth, ID band Patient awake    Reviewed: Allergy & Precautions, H&P , NPO status , Patient's Chart, lab work & pertinent test results  Airway Mallampati: II   Neck ROM: full    Dental   Pulmonary Current Smoker,    breath sounds clear to auscultation       Cardiovascular negative cardio ROS   Rhythm:regular Rate:Normal     Neuro/Psych PSYCHIATRIC DISORDERS Anxiety    GI/Hepatic GERD  ,Pancreatic CA   Endo/Other    Renal/GU      Musculoskeletal   Abdominal   Peds  Hematology   Anesthesia Other Findings   Reproductive/Obstetrics                             Anesthesia Physical Anesthesia Plan  ASA: III  Anesthesia Plan: General   Post-op Pain Management:    Induction: Intravenous  PONV Risk Score and Plan: 1 and Ondansetron, Dexamethasone, Midazolam and Treatment may vary due to age or medical condition  Airway Management Planned: Oral ETT  Additional Equipment: Arterial line, CVP and Ultrasound Guidance Line Placement  Intra-op Plan:   Post-operative Plan: Extubation in OR and Possible Post-op intubation/ventilation  Informed Consent: I have reviewed the patients History and Physical, chart, labs and discussed the procedure including the risks, benefits and alternatives for the proposed anesthesia with the patient or authorized representative who has indicated his/her understanding and acceptance.       Plan Discussed with: CRNA, Anesthesiologist and Surgeon  Anesthesia Plan Comments:         Anesthesia Quick Evaluation

## 2018-11-11 ENCOUNTER — Encounter (HOSPITAL_COMMUNITY): Payer: Self-pay | Admitting: General Surgery

## 2018-11-11 LAB — BPAM RBC
Blood Product Expiration Date: 202004172359
Blood Product Expiration Date: 202004182359
Blood Product Expiration Date: 202004182359
Blood Product Expiration Date: 202004182359
Unit Type and Rh: 6200
Unit Type and Rh: 6200
Unit Type and Rh: 6200
Unit Type and Rh: 6200

## 2018-11-11 LAB — TYPE AND SCREEN
ABO/RH(D): A POS
Antibody Screen: NEGATIVE
Unit division: 0
Unit division: 0
Unit division: 0
Unit division: 0

## 2018-11-11 NOTE — OR Nursing (Signed)
Addendum to add Recovery Care Complete time

## 2018-11-12 ENCOUNTER — Inpatient Hospital Stay: Payer: Managed Care, Other (non HMO) | Attending: Hematology | Admitting: Hematology

## 2018-11-12 ENCOUNTER — Other Ambulatory Visit: Payer: Self-pay | Admitting: Hematology

## 2018-11-12 DIAGNOSIS — C787 Secondary malignant neoplasm of liver and intrahepatic bile duct: Secondary | ICD-10-CM | POA: Diagnosis not present

## 2018-11-12 DIAGNOSIS — C259 Malignant neoplasm of pancreas, unspecified: Secondary | ICD-10-CM

## 2018-11-12 DIAGNOSIS — Z86718 Personal history of other venous thrombosis and embolism: Secondary | ICD-10-CM

## 2018-11-12 DIAGNOSIS — Z9221 Personal history of antineoplastic chemotherapy: Secondary | ICD-10-CM

## 2018-11-12 DIAGNOSIS — Z7901 Long term (current) use of anticoagulants: Secondary | ICD-10-CM

## 2018-11-12 DIAGNOSIS — K5900 Constipation, unspecified: Secondary | ICD-10-CM

## 2018-11-12 NOTE — Progress Notes (Signed)
[place]   Telephone:([phone]) [phone] Fax:(336) [phone]   Clinic Follow up Note   Patient Care Team: Street, [name], MD as PCP - General (Family Medicine)   I connected with [name] on 11/12/2018 at  2:30 PM EDT by telephone and verified that I am speaking with the correct person using two identifiers.   I discussed the limitations, risks, security and privacy concerns of performing an evaluation and management service by telephone and the availability of in person appointments. I also discussed with the patient that there may be a patient responsible charge related to this service. The patient expressed understanding and agreed to proceed.   Other persons participating in the visit and their role in the encounter:  His wife  Patient's location:  His home  Provider's location:  My Office   CHIEF COMPLAINT: F/u pancreatic cancer  SUMMARY OF ONCOLOGIC HISTORY: Oncology History   Cancer Staging Adenocarcinoma of pancreas [facility]) Staging form: Exocrine Pancreas, AJCC 8th Edition - Clinical stage from 06/10/2018: Stage IB (cT2, cN0, cM0) - Signed by [name], MD on 06/29/2018       Adenocarcinoma of pancreas ([place])   06/07/2018 Imaging    CT AP w Contrast IMPRESSION: 1. There is a low attenuation mass centered around the neck of pancreas which is concerning for neoplasm. Pancreatic adenocarcinoma favored. This results in common bile duct obstruction with mild intrahepatic biliary ductal dilatation. There also is involvement of the portal venous confluence. Further evaluation with nonemergent contrast enhanced MRI of the pancreas is recommended. 2. Small indeterminate low-attenuation structure is noted within segment 4 a of the liver. This could be better addressed at MRI.    06/09/2018 Imaging    MR ABD MRCP  The pancreatic mass involves approximately 40 percent of the main portal vein circumference at the portal splenic venous confluence, with associated  mild narrowing of the portal splenic venous confluence. The SMV, splenic vein and main, right and left portal veins remain patent. The celiac trunk and SMA are not involved by the pancreatic mass.  IMPRESSION: 2.3 cm diameter hypoenhancing mass at the junction of the head and neck of pancreas with pancreatic ductal dilatation, likely adenocarcinoma.  Intra and extrahepatic bile duct dilatation with abrupt change in caliber at the mid common bile duct. This could indicate an obstructing mass or stricture.     06/10/2018 Initial Biopsy    Diagnosis PANCREAS, FINE NEEDLE ASPIRATION (SPECIMEN 1 OF 1 COLLECTED 06/10/18): MALIGNANT CELLS CONSISTENT WITH ADENOCARCINOMA.    06/10/2018 Procedure    IMPRESSION: 1. 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 2. Placement of a metallic biliary stent.    06/10/2018 Procedure    EUS per Dr. Paulita Fujita Impression:  - There was dilation in the common bile duct which measured up to 12 mm. - A mass was identified in the pancreatic head. This was staged T3 N1 Mx by endosonographic criteria. Fine needle aspiration performed. - A few lymph nodes were visualized and measured in the peripancreatic region. - There was no evidence of significant pathology in the left lobe of the liver.    06/10/2018 Cancer Staging    Staging form: Exocrine Pancreas, AJCC 8th Edition - Clinical stage from 06/10/2018: Stage IB (cT2, cN0, cM0) - Signed by [name], MD on 06/29/2018    06/27/2018 Initial Diagnosis    Adenocarcinoma of pancreas ([place])    07/02/2018 Imaging    IMPRESSION: 1. Multiple  small pulmonary nodules scattered throughout the lungs measuring up to 7 mm in size. These are nonspecific, but the possibility of metastatic disease should be considered, and close attention at time of routine followups is recommended. 2. In addition, today's study demonstrates new and enlarging low-attenuation lesions in the liver.  This is poorly evaluated on today's noncontrast CT examination, but is concerning for potential metastatic disease. Further evaluation with repeat nonemergent MRI of the abdomen with and without IV gadolinium is suggested in the near future to better evaluate these findings. 3. Aortic atherosclerosis, in addition to left main and 2 vessel coronary artery disease. Please note that although the presence of coronary artery calcium documents the presence of coronary artery disease, the severity of this disease and any potential stenosis cannot be assessed on this non-gated CT examination. Assessment for potential risk factor modification, dietary therapy or pharmacologic therapy may be warranted, if clinically indicated. 4. Mild aneurysmal dilatation of the ascending thoracic aorta (4.8 cm in diameter). Ascending thoracic aortic aneurysm. Recommend semi-annual imaging followup by CTA or MRA and referral to cardiothoracic surgery if not already obtained. This recommendation follows 2010 ACCF/AHA/AATS/ACR/ASA/SCA/SCAI/SIR/STS/SVM Guidelines for the Diagnosis and Management of Patients With Thoracic Aortic Disease. Circulation. 2010; 121: B284-X324.  Aortic Atherosclerosis (ICD10-I70.0). Aortic aneurysm NOS (ICD10-I71.9).    07/04/2018 - 10/16/2018 Chemotherapy     FOLFIRINOX q2 weeks    07/15/2018 Imaging    07/15/2018 Liver US IMPRESSION: No liver lesions identified with ultrasound. Ultrasound-guided biopsy was not performed. Recommend further characterization for liver lesions with a repeat MRI, with and without contrast.    07/24/2018 Imaging    07/24/2018 MRI Abdomen IMPRESSION: 1. The hypoenhancing mass at the junction of the pancreatic body and head has reduced in size, previously 3.2 by 2.8 cm and currently 2.9 by 2.2 cm. A small peripancreatic lymph node adjacent to the mass was previously 0.9 cm in short axis and is currently 0.7 cm in short axis. The amount of contact  between the pancreatic mass and the confluence of the splenic vein and SMV is similar to the prior exam. 2. There is a 6 mm probable hemangioma in segment 4a of the liver, based on the delayed enhancement pattern. This is somewhat ill-defined. The lesion appeared larger on the prior noncontrast CT but presumably may have been overestimated on that exam. There is also some hypodensity along the dome of the right hepatic lobe which appears to most likely be due to a slip of the diaphragm rather than a discernible lesion on MRI. 3. Aortic Atherosclerosis (ICD10-I70.0). Notably, there is a small amount of mural thrombus along the right side of the abdominal aorta below the right renal artery level which has not been seen previously.    09/03/2018 Imaging    09/03/2018 MRI Abdomen IMPRESSION: 1. Stable mass (adenocarcinoma) in the neck of the pancreas with upstream duct dilatation. 2. No evidence of lymphadenopathy in the porta hepatis or peripancreatic fat. 3. No evidence hepatic metastasis. 4. Mild biliary duct dilatation LEFT hepatic lobe similar to comparison exam. Biliary stent within the common bile duct.    09/16/2018 Imaging    MR MRA CHEST W WO CONTRAST  IMPRESSION: VASCULAR  1. 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 2. Left subclavian port catheter to the SVC. 3. SVC is patent      10/24/2018 Imaging    MRI abdomen  IMPRESSION: 1. Slight interval decrease in size of the pancreatic mass. 2. Stable  to slightly smaller adjacent lymph nodes. 3. No findings for metastatic disease involving the liver or lung bases. 4. Common bile duct stent in good position without complicating features.    11/10/2018 Surgery    PAC removal, Laparoscopic Cholecystectomy and Diagnostic Laparoscopic Liver Biopsy by Dre. Byerly  and Dr. Lucia Gaskins  11/10/18     11/10/2018 Pathology Results    Diagnosis 11/10/18 1. Liver, biopsy, Left -  ADENOCARCINOMA. - SEE COMMENT. 2. Gallbladder - CHRONIC CHOLECYSTITIS WITH CHOLELITHIASIS. - ONE MORPHOLOGICALLY BENIGN LYMPH NODE.     Chemotherapy    PENDING second-line chemo with Gemcitabine and Abraxane 2-3 weeks on/1 week off starting in 3 weeks       CURRENT THERAPY:  PENDING second-line chemo with Gemcitabine and Abraxane 2-3 weeks on/1 week off starting in 3 weeks   INTERVAL HISTORY:  MATEUSZ NEILAN is here for a follow up post surgery. He was able to identify himself by birth date.   He notes he is still recovering from surgery with gas buildup and abdominal soreness from bloating. He has been constipated since hospitalization. He has been taking stool softeners. He has Miralax. He notes he is not as active but is eating adequately.  He notes having a mild cough with mild phlegm production. He denies fever or peripheral edema.    REVIEW OF SYSTEMS:   Constitutional: Denies fevers, chills or abnormal weight loss Eyes: Denies blurriness of vision Ears, nose, mouth, throat, and face: Denies mucositis or sore throat  Respiratory: Denies cough, dyspnea or wheezes (+) Mildly productive cough  Cardiovascular: Denies palpitation, chest discomfort or lower extremity swelling Gastrointestinal:  Denies nausea, heartburn (+) Constipation and bloating (+) abdominal soreness Skin: Denies abnormal skin rashes Lymphatics: Denies new lymphadenopathy or easy bruising Neurological:Denies numbness, tingling or new weaknesses Behavioral/Psych: Mood is stable, no new changes  All other systems were reviewed with the patient and are negative.  MEDICAL HISTORY:  Past Medical History:  Diagnosis Date  . Anxiety   . Cancer ([place]) 06/2018   Pancreatic  . GERD (gastroesophageal reflux disease)     SURGICAL HISTORY: Past Surgical History:  Procedure Laterality Date  . BILIARY STENT PLACEMENT  06/10/2018   Procedure: BILIARY STENT PLACEMENT;  Surgeon: Ronnette Juniper, MD;  Location: [facility]  ENDOSCOPY;  Service: Gastroenterology;;  . CHOLECYSTECTOMY N/A 11/10/2018   Procedure: Laparoscopic Cholecystectomy;  Surgeon: Stark Klein, MD;  Location: [place];  Service: General;  Laterality: N/A;  . DIAGNOSTIC LAPAROSCOPIC LIVER BIOPSY N/A 11/10/2018   Procedure: Diagnostic Laparoscopic Liver Biopsy;  Surgeon: Stark Klein, MD;  Location: [place];  Service: General;  Laterality: N/A;  . ERCP N/A 06/10/2018   Procedure: ENDOSCOPIC RETROGRADE CHOLANGIOPANCREATOGRAPHY (ERCP);  Surgeon: Ronnette Juniper, MD;  Location: [place];  Service: Gastroenterology;  Laterality: N/A;  . ESOPHAGOGASTRODUODENOSCOPY (EGD) WITH PROPOFOL N/A 06/10/2018   Procedure: ESOPHAGOGASTRODUODENOSCOPY (EGD) WITH PROPOFOL;  Surgeon: Ronnette Juniper, MD;  Location: [place];  Service: Gastroenterology;  Laterality: N/A;  . FINE NEEDLE ASPIRATION  06/10/2018   Procedure: FINE NEEDLE ASPIRATION (FNA) LINEAR;  Surgeon: Ronnette Juniper, MD;  Location: [place];  Service: Gastroenterology;;  . PORT-A-CATH REMOVAL N/A 11/10/2018   Procedure: REMOVAL PORT-A-CATH;  Surgeon: Stark Klein, MD;  Location: [place];  Service: General;  Laterality: N/A;  . PORTACATH PLACEMENT N/A 07/02/2018   Procedure: INSERTION PORT-A-CATH;  Surgeon: Stark Klein, MD;  Location: [place];  Service: General;  Laterality: N/A;  . SPHINCTEROTOMY  06/10/2018   Procedure: SPHINCTEROTOMY;  Surgeon: Ronnette Juniper, MD;  Location: [place] ENDOSCOPY;  Service: Gastroenterology;;  . UPPER ESOPHAGEAL ENDOSCOPIC ULTRASOUND (EUS) N/A 06/10/2018   Procedure: UPPER ESOPHAGEAL ENDOSCOPIC ULTRASOUND (EUS);  Surgeon: Ronnette Juniper, MD;  Location: [place];  Service: Gastroenterology;  Laterality: N/A;  . VASECTOMY      I have reviewed the social history and family history with the patient and they are unchanged from previous note.  ALLERGIES:  is allergic to contrast media [iodinated diagnostic agents] and iohexol.  MEDICATIONS:  Current Outpatient Medications  Medication Sig  Dispense Refill  . ALPRAZolam (XANAX) 0.25 MG tablet Take 1 tablet (0.25 mg total) by mouth at bedtime as needed for anxiety. 30 tablet 0  . azithromycin (ZITHROMAX) 500 MG tablet Take 1 tablet (500 mg total) by mouth daily. (Patient not taking: Reported on 10/28/2018) 3 tablet 0  . diphenoxylate-atropine (LOMOTIL) 2.5-0.025 MG tablet Take 2 tablets by mouth 4 (four) times daily as needed for diarrhea or loose stools. 40 tablet 0  . enoxaparin (LOVENOX) 120 MG/0.8ML injection Inject 0.8 mLs (120 mg total) into the skin daily. 3 Syringe 0  . esomeprazole (NEXIUM) 20 MG capsule Take 20 mg by mouth daily after breakfast.     . folic acid (FOLVITE) 1 MG tablet Take 1 mg by mouth daily after breakfast.     . ibuprofen (ADVIL,MOTRIN) 200 MG tablet Take 400 mg by mouth daily as needed for headache or moderate pain.     Marland Kitchen lidocaine-prilocaine (EMLA) cream Apply to affected area once (Patient not taking: Reported on 10/28/2018) 30 g 3  . loratadine (CLARITIN) 10 MG tablet Take 10 mg by mouth daily after breakfast.    . magic mouthwash w/lidocaine SOLN Take 5 mLs by mouth 4 (four) times daily as needed for mouth pain. 240 mL 2  . ondansetron (ZOFRAN) 8 MG tablet Take 1 tablet (8 mg total) by mouth 2 (two) times daily as needed for refractory nausea / vomiting. Start on day 3 after chemotherapy. 30 tablet 1  . oxyCODONE (OXY IR/ROXICODONE) 5 MG immediate release tablet Take 1 tablet (5 mg total) by mouth every 6 (six) hours as needed for severe pain. 20 tablet 0  . oxyCODONE (OXY IR/ROXICODONE) 5 MG immediate release tablet Take 1 tablet (5 mg total) by mouth every 6 (six) hours as needed for severe pain. 15 tablet 0  . prochlorperazine (COMPAZINE) 10 MG tablet Take 1 tablet (10 mg total) by mouth every 6 (six) hours as needed (NAUSEA). 30 tablet 1  . rivaroxaban (XARELTO) 20 MG TABS tablet Take 1 tablet (20 mg total) by mouth daily with supper. (Patient taking differently: Take 20 mg by mouth daily after  breakfast. ) 30 tablet 2  . simethicone (MYLICON) 875 MG chewable tablet Chew 125 mg by mouth every 6 (six) hours as needed for flatulence.     No current facility-administered medications for this visit.    Facility-Administered Medications Ordered in Other Visits  Medication Dose Route Frequency Provider Last Rate Last Dose  . heparin lock flush 100 unit/mL  500 Units Intracatheter Once PRN [name], MD      . sodium chloride flush (NS) 0.9 % injection 10 mL  10 mL Intracatheter PRN [name], MD        PHYSICAL EXAMINATION: ECOG PERFORMANCE STATUS: 2 - Symptomatic, <50% confined to bed  exam not performed   LABORATORY DATA:  I have reviewed the data as listed CBC Latest Ref Rng & Units 11/03/2018 10/16/2018 10/02/2018  WBC 4.0 - 10.5 K/uL 5.0  9.0 6.1  Hemoglobin 13.0 - 17.0 g/dL 13.8 14.1 12.9(L)  Hematocrit 39.0 - 52.0 % 44.2 42.8 39.8  Platelets 150 - 400 K/uL 139(L) 141(L) 123(L)     CMP Latest Ref Rng & Units 11/03/2018 10/16/2018 10/02/2018  Glucose 70 - 99 mg/dL 117(H) 127(H) 137(H)  BUN 6 - 20 mg/dL 13 10 6   Creatinine 0.61 - 1.24 mg/dL 0.77 0.80 0.80  Sodium 135 - 145 mmol/L 138 141 143  Potassium 3.5 - 5.1 mmol/L 4.8 4.2 3.9  Chloride 98 - 111 mmol/L 103 106 107  CO2 22 - 32 mmol/L 24 25 25   Calcium 8.9 - 10.3 mg/dL 9.4 9.2 8.9  Total Protein 6.5 - 8.1 g/dL 6.6 7.0 6.5  Total Bilirubin 0.3 - 1.2 mg/dL 0.5 0.3 0.3  Alkaline Phos 38 - 126 U/L 107 151(H) 151(H)  AST 15 - 41 U/L 27 25 30   ALT 0 - 44 U/L 37 37 45(H)      RADIOGRAPHIC STUDIES: I have personally reviewed the radiological images as listed and agreed with the findings in the report. No results found.   ASSESSMENT & PLAN:  Ernest Wu is a 58 y.o. male with   1. Adenocarcinoma of the pancreas,cT3N1M0, borderline resectable, liver metastasis on 11/10/2018  -Diagnosed in 06/2018. Treated with neoadjuvant chemo. He completed 8 cycles of FOLRIRNOX  -He underwent surgery with Dr. Barry Dienes. During surgery  he was found to have liver lesion and whipple surgery was aborted. Preop MRI was negative for liver or other metastasis  -Liver biopsy results show pancreatic metastasis. At this stage IV disease his cancer is no longer curable.I reviewed the nature history of metastatic pancreatic cancer  -After he recovers from surgery, I plan to start him on second-line chemo with Gemcitabine and Abraxane 2-3 weeks on/1 week off. Goal of treatment is palliative to control his disease and prolong his life. Plan to continue for as long as he can tolerate or it controls his disease.  --Chemotherapy consent: Side effects including but does not not limited to, fatigue, nausea, vomiting, diarrhea, hair loss, neuropathy, fluid retention, renal and kidney dysfunction, neutropenic fever, needed for blood transfusion, bleeding, were discussed with patient in great detail. He agrees to proceed. -the goal of therapy is palliative to prolong his life  -Will request FO genomic testing on his biopsy sample and genetic testing to see if he is eligable for more target therapy or immunotherapy options.  -During surgery he had PAC removed. He is fine to proceed without PAC for now and if needed will have another PAC placed.  -Given he lives in [place] his optoin to [place] care over there. He wishes to continue care with me and our cancer center. -Plan to start second-line chemo in 3 weeks  -F/u in 3 weeks   2.Alcohol and smokingcessation -Hehasquit drinking alcohol, and is currently on folic acid. 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 -Heis onXarelto20 mg daily now which was held before surgery. I instructed him to restart Xarelto today  -I discussed he is at increased risk  of blood clot given pancreatic cancer.   4. Constipation, bloating  -Secondary to surgery  -he has not had a bowel movement since before surgery -I instructed him to start miralax to ensure he has a bowel  movement.   5. Financial Support  -I strongly encouraged him to apply for disability given his new prognosis.     Plan -Lab, F/u and second line chemo with gemcitabine and abraxane treatment of week of 4/27  -will do genetic test on 4/27  -will request FO on his biopsy sample  -Pt would like to apply for disability, will refer him to SW    No problem-specific Assessment & Plan notes found for this encounter.   No orders of the defined types were placed in this encounter.  All questions were answered. The patient knows to call the clinic with any problems, questions or concerns. No barriers to learning was detected. I spent 25 minutes counseling the patient over the phone. The total time spent in the appointment was 30 minutes and more than 50% was on counseling and review of test results     [name], MD 11/12/2018   I, Joslyn Devon, am acting as scribe for [name], MD.   I have reviewed the above documentation for accuracy and completeness, and I agree with the above.

## 2018-11-12 NOTE — Progress Notes (Signed)
DISCONTINUE ON PATHWAY REGIMEN - Pancreatic     A cycle is every 14 days:     Oxaliplatin      Leucovorin      Irinotecan      5-Fluorouracil      5-Fluorouracil   **Always confirm dose/schedule in your pharmacy ordering system**  REASON: Disease Progression PRIOR TREATMENT: PANOS62: FOLFIRINOX q14 Days x 4 Cycles TREATMENT RESPONSE: Unable to Evaluate  START ON PATHWAY REGIMEN - Pancreatic Adenocarcinoma     A cycle is every 28 days:     Nab-paclitaxel (protein bound)      Gemcitabine   **Always confirm dose/schedule in your pharmacy ordering system**  Patient Characteristics: Metastatic Disease, Second Line, MSS/pMMR or MSI Unknown, Fluoropyrimidine-Based Therapy First Line Current evidence of distant metastases<= Yes AJCC T Category: T3 AJCC N Category: N1 AJCC M Category: M1 AJCC 8 Stage Grouping: IV Line of Therapy: Second Line Microsatellite/Mismatch Repair Status: Unknown Intent of Therapy: Non-Curative / Palliative Intent, Discussed with Patient

## 2018-11-13 ENCOUNTER — Encounter: Payer: Self-pay | Admitting: General Practice

## 2018-11-13 NOTE — Progress Notes (Signed)
[place] CSW Progress Notes  Request received from MD to  Speak to wife about apply for disability on patient behalf.  Spoke w spouse, will refer to [facility] for help.  Also explained process so she can apply directly via [email or web address].  Explained wait periods, need to respond promptly to calls/letters from [facility] to verify information.  Wife will continue to work, but income has been limited by patient's inability to return to work (was self employed so does not have any disability insurance options through employer).  Will email information about [facility] and their limited financial assistance, will also provide information on pancreatic cancer help organizations so she can connect w other caregivers for additional support.  Provided my contact information, encouraged her to reach out as needed.  Edwyna Shell, LCSW Clinical Social Worker Phone:  [phone]

## 2018-11-17 ENCOUNTER — Ambulatory Visit
Admission: EM | Admit: 2018-11-17 | Discharge: 2018-11-17 | Disposition: A | Discharge status: Home / Self Care | Payer: Managed Care, Other (non HMO) | Source: Home / Self Care

## 2018-11-17 ENCOUNTER — Telehealth: Payer: Self-pay | Admitting: Hematology

## 2018-11-17 ENCOUNTER — Telehealth: Payer: Self-pay

## 2018-11-17 ENCOUNTER — Other Ambulatory Visit: Payer: Self-pay

## 2018-11-17 ENCOUNTER — Ambulatory Visit (INDEPENDENT_AMBULATORY_CARE_PROVIDER_SITE_OTHER): Payer: Managed Care, Other (non HMO)

## 2018-11-17 ENCOUNTER — Inpatient Hospital Stay (HOSPITAL_BASED_OUTPATIENT_CLINIC_OR_DEPARTMENT_OTHER): Payer: Managed Care, Other (non HMO) | Admitting: Medical

## 2018-11-17 DIAGNOSIS — F17200 Nicotine dependence, unspecified, uncomplicated: Secondary | ICD-10-CM

## 2018-11-17 DIAGNOSIS — R0602 Shortness of breath: Secondary | ICD-10-CM

## 2018-11-17 DIAGNOSIS — J209 Acute bronchitis, unspecified: Secondary | ICD-10-CM | POA: Diagnosis not present

## 2018-11-17 DIAGNOSIS — C259 Malignant neoplasm of pancreas, unspecified: Secondary | ICD-10-CM

## 2018-11-17 DIAGNOSIS — R509 Fever, unspecified: Secondary | ICD-10-CM

## 2018-11-17 MED ORDER — AZITHROMYCIN 250 MG PO TABS: ORAL_TABLET | ORAL | 0 refills | Status: DC

## 2018-11-17 NOTE — Telephone Encounter (Signed)
Faxed wife's FMLA paperwork to [facility].

## 2018-11-17 NOTE — Telephone Encounter (Signed)
Left message re next appointment for 4/27. Schedule message.

## 2018-11-17 NOTE — ED Provider Notes (Signed)
[facility]    CSN: [id number] Arrival date & time: 11/17/18  1219     History   Chief Complaint Chief Complaint  Patient presents with  . Cough    HPI Ernest Wu is a 58 y.o. male.    Ernest Wu presents with complaints of increased temp over the past 2-3 days. Notices it primarily at night. Started out to 100.3. Last night to 100.9. Called his oncologist today, was sent here for evaluation. With deep breathing feels abdominal pain, pain under ribs. Has been sore since his cholecystectomy 1 week ago. Can't take deep breath due to pain, feels shallow. Pain to upper abdomen- feels tight. No cough- no production. No runny nose. No ear pain no sore throat. No nausea, vomiting. He has been eating approximately 2 meals a day. Was constipated, but has been able to go since- taking stool softener. Had a BM today, but states he feels he still needs to pass more stool. Current abdominal discomfort is mild. Rib pain with deep inspiration, makes him feel "tight" and mildly short of breath. Denies previous similar. No redness, increased tenderness or drainage from laparoscopic sites.  Taking blood thinner, folic acid, allergy medicaion Tylenol- last at 0400 this am- seems to help. No incentive spirometer.     Per oncology notes from telephone encounter today:   Adenocarcinoma of pancreas ([place])  Shortness of breath  Fever, unspecified fever cause   Adenocarcinoma of the pancreas: The patient is status post cycle 8 of FOLFIRI Geralynn Ochs which was dosed on 10/16/2018.  He was scheduled to have a Whipple procedure and underwent surgery under the care of Dr. Barry Dienes with his surgery aborted after being found to have metastatic liver lesions.  He had obstructive jaundice and was taken for the surgery on 11/10/2018.  Dr. Burr Medico is planning on treating him with second line chemotherapy with gemcitabine and Abraxane for palliation after his recovery.  Shortness of breath and fever:  The patient's wife contacted her office today stating that the patient had acutely developed a fever of 101.9 and was now having breathlessness and shortness of breath. Dr. Jamse Arn of the [facility] at [place]. [address], [place], [place] was contacted. Juanda Bond [place]'s symptoms were discussed with Dr. Lanny Cramp. He was agreeable to see this patient.  The patient was given contact information and was instructed to present to urgent care immediately for evaluation and management. Ernest Wu expressed understanding and agreement with this plan.  This case was discussed with Dr. [name].       Past Medical History:  Diagnosis Date  . Anxiety   . Cancer ([place]) 06/2018   Pancreatic  . GERD (gastroesophageal reflux disease)     Patient Active Problem List   Diagnosis Date Noted  . Port-A-Cath in place 07/17/2018  . Adenocarcinoma of pancreas ([place]) 06/27/2018  . Pancreatic mass   . Tobacco use   . Direct hyperbilirubinemia   . Jaundice 06/07/2018    Past Surgical History:  Procedure Laterality Date  . BILIARY STENT PLACEMENT  06/10/2018   Procedure: BILIARY STENT PLACEMENT;  Surgeon: Ronnette Juniper, MD;  Location: [facility];  Service: Gastroenterology;;  . CHOLECYSTECTOMY N/A 11/10/2018   Procedure: Laparoscopic Cholecystectomy;  Surgeon: Stark Klein, MD;  Location: [facility];  Service: General;  Laterality: N/A;  . DIAGNOSTIC LAPAROSCOPIC LIVER BIOPSY N/A 11/10/2018   Procedure: Diagnostic Laparoscopic Liver Biopsy;  Surgeon: Stark Klein, MD;  Location: [facility];  Service:  General;  Laterality: N/A;  . ERCP N/A 06/10/2018   Procedure: ENDOSCOPIC RETROGRADE CHOLANGIOPANCREATOGRAPHY (ERCP);  Surgeon: Ronnette Juniper, MD;  Location: [place];  Service: Gastroenterology;  Laterality: N/A;  . ESOPHAGOGASTRODUODENOSCOPY (EGD) WITH PROPOFOL N/A 06/10/2018   Procedure: ESOPHAGOGASTRODUODENOSCOPY (EGD) WITH PROPOFOL;  Surgeon: Ronnette Juniper, MD;  Location: [facility];  Service:  Gastroenterology;  Laterality: N/A;  . FINE NEEDLE ASPIRATION  06/10/2018   Procedure: FINE NEEDLE ASPIRATION (FNA) LINEAR;  Surgeon: Ronnette Juniper, MD;  Location: [place];  Service: Gastroenterology;;  . PORT-A-CATH REMOVAL N/A 11/10/2018   Procedure: REMOVAL PORT-A-CATH;  Surgeon: Stark Klein, MD;  Location: [facility];  Service: General;  Laterality: N/A;  . PORTACATH PLACEMENT N/A 07/02/2018   Procedure: INSERTION PORT-A-CATH;  Surgeon: Stark Klein, MD;  Location: [place] [facility] [place];  Service: General;  Laterality: N/A;  . SPHINCTEROTOMY  06/10/2018   Procedure: SPHINCTEROTOMY;  Surgeon: Ronnette Juniper, MD;  Location: [facility];  Service: Gastroenterology;;  . UPPER ESOPHAGEAL ENDOSCOPIC ULTRASOUND (EUS) N/A 06/10/2018   Procedure: UPPER ESOPHAGEAL ENDOSCOPIC ULTRASOUND (EUS);  Surgeon: Ronnette Juniper, MD;  Location: [facility];  Service: Gastroenterology;  Laterality: N/A;  . VASECTOMY         Home Medications    Prior to Admission medications   Medication Sig Start Date End Date Taking? Authorizing Provider  ALPRAZolam (XANAX) 0.25 MG tablet Take 1 tablet (0.25 mg total) by mouth at bedtime as needed for anxiety. 07/07/18   [name], MD  azithromycin (ZITHROMAX) 250 MG tablet Take 2 tablets (500 mg total) by mouth daily for 1 day, THEN 1 tablet (250 mg total) daily for 4 days. 11/17/18 11/22/18  Zigmund Gottron, NP  diphenoxylate-atropine (LOMOTIL) 2.5-0.025 MG tablet Take 2 tablets by mouth 4 (four) times daily as needed for diarrhea or loose stools. 06/27/18   Alla Feeling, NP  enoxaparin (LOVENOX) 120 MG/0.8ML injection Inject 0.8 mLs (120 mg total) into the skin daily. 11/03/18   [name], MD  esomeprazole (NEXIUM) 20 MG capsule Take 20 mg by mouth daily after breakfast.     [provider]  folic acid (FOLVITE) 1 MG tablet Take 1 mg by mouth daily after breakfast.  08/15/18   [provider]  ibuprofen (ADVIL,MOTRIN) 200 MG tablet Take 400 mg by mouth daily as needed for  headache or moderate pain.     [provider]  loratadine (CLARITIN) 10 MG tablet Take 10 mg by mouth daily after breakfast.    [provider]  magic mouthwash w/lidocaine SOLN Take 5 mLs by mouth 4 (four) times daily as needed for mouth pain. 08/22/18   Tanner, Lyndon Code., PA-C  oxyCODONE (OXY IR/ROXICODONE) 5 MG immediate release tablet Take 1 tablet (5 mg total) by mouth every 6 (six) hours as needed for severe pain. 07/02/18   Stark Klein, MD  oxyCODONE (OXY IR/ROXICODONE) 5 MG immediate release tablet Take 1 tablet (5 mg total) by mouth every 6 (six) hours as needed for severe pain. 11/10/18   Stark Klein, MD  rivaroxaban (XARELTO) 20 MG TABS tablet Take 1 tablet (20 mg total) by mouth daily with supper. Patient taking differently: Take 20 mg by mouth daily after breakfast.  10/02/18   [name], MD  simethicone (MYLICON) 161 MG chewable tablet Chew 125 mg by mouth every 6 (six) hours as needed for flatulence.    [provider]  prochlorperazine (COMPAZINE) 10 MG tablet Take 1 tablet (10 mg total) by mouth every 6 (six) hours as needed (NAUSEA).  06/29/18 11/12/18  [name], MD    Family History History reviewed. No pertinent family history.  Social History Social History   Tobacco Use  . Smoking status: Current Every Day Smoker    Packs/day: 0.25    Years: 30.00    Pack years: 7.50  . Smokeless tobacco: Never Used  . Tobacco comment: cut back considerably  Substance Use Topics  . Alcohol use: Yes    Comment: 6-7 beers Q week  . Drug use: Never     Allergies   Contrast media [iodinated diagnostic agents] and Iohexol   Review of Systems Review of Systems   Physical Exam Triage Vital Signs ED Triage Vitals  Enc Vitals Group     BP 11/17/18 1233 (!) 142/84     Pulse Rate 11/17/18 1233 96     Resp 11/17/18 1233 20     Temp 11/17/18 1233 98.6 F (37 C)     Temp src --      SpO2 11/17/18 1233 95 %     Weight --      Height --      Head  Circumference --      Peak Flow --      Pain Score 11/17/18 1234 0     Pain Loc --      Pain Edu? --      Excl. in [place]? --    No data found.  Updated Vital Signs BP (!) 142/84   Pulse 96   Temp 98.6 F (37 C)   Resp 20   SpO2 95%    Physical Exam Constitutional:      Appearance: He is well-developed.  Cardiovascular:     Rate and Rhythm: Normal rate and regular rhythm.  Pulmonary:     Effort: Pulmonary effort is normal. No respiratory distress.     Breath sounds: Normal breath sounds. No wheezing or rhonchi.  Chest:     Chest wall: No tenderness.  Abdominal:     General: Bowel sounds are decreased.     Palpations: Abdomen is soft.     Comments: 5 puncture wounds to abdomen- well healing; non tender, no redness, no drainage, soft  Skin:    General: Skin is warm and dry.  Neurological:     Mental Status: He is alert and oriented to person, place, and time.      UC Treatments / Results  Labs (all labs ordered are listed, but only abnormal results are displayed) Labs Reviewed - No data to display  EKG None  Radiology Dg Chest 2 View  Result Date: 11/17/2018 CLINICAL DATA:  Shortness of breath, fever, and bilateral rib pain. History of smoking and pancreatic cancer. EXAM: CHEST - 2 VIEW COMPARISON:  07/18/2018 FINDINGS: Left subclavian Port-A-Cath has been removed. The cardiomediastinal silhouette is within normal limits. The lungs are mildly hyperinflated with peribronchial thickening. No confluent airspace opacity, overt pulmonary edema, pleural effusion, pneumothorax is identified. No acute osseous abnormality is seen. IMPRESSION: Bronchitic changes. Electronically Signed   By: Logan Bores M.D.   On: 11/17/2018 13:24    Procedures Procedures (including critical care time)  Medications Ordered in UC Medications - No data to display  Initial Impression / Assessment and Plan / UC Course  I have reviewed the triage vital signs and the nursing notes.  Pertinent  labs & imaging results that were available during my care of the patient were reviewed by me and considered in my medical decision making (see chart for  details).     Non toxic. No increased work of breathing. Lungs clear today. No cough or congestion. Fever managed with tylenol, last dose at 0400 this am. Belly soft and non distended. No indications of acute abdomen on exam, well healing laparoscopic wounds. Bronchitis on chest xray today, hx of smoking. Viral vs chronic changes vs acute bacterial. Will cover for atypical with azithromycin. Encouraged follow up with oncologist and/or surgeon as directed. Return precautions provided. Patient verbalized understanding and agreeable to plan.  Ambulatory out of clinic without difficulty.    Final Clinical Impressions(s) / UC Diagnoses   Final diagnoses:  Acute bronchitis, unspecified organism     Discharge Instructions     Your chest xray has some mild bronchitis.  This may be viral in nature but we will cover for atypical causes of bacterial bronchitis.  Continue with regimen to promote bowel movements.  Tylenol as needed for fever or body aches.  Continue to follow up with your surgeon and/or oncologist as needed and as directed.  If any worsening of symptoms- increased fevers, chest pain , shortness of breath , weakness, confusion or otherwise worsening please go to the ER.     ED Prescriptions    Medication Sig Dispense Auth. Provider   azithromycin (ZITHROMAX) 250 MG tablet  (Status: Discontinued) Take 2 tablets (500 mg total) by mouth daily for 1 day, THEN 1 tablet (250 mg total) daily for 4 days. 6 tablet Augusto Gamble B, NP   azithromycin (ZITHROMAX) 250 MG tablet Take 2 tablets (500 mg total) by mouth daily for 1 day, THEN 1 tablet (250 mg total) daily for 4 days. 6 tablet Zigmund Gottron, NP     Controlled Substance Prescriptions [place] Controlled Substance Registry consulted? Not Applicable   Zigmund Gottron, NP 11/17/18 1342

## 2018-11-17 NOTE — Discharge Instructions (Addendum)
Your chest xray has some mild bronchitis.  This may be viral in nature but we will cover for atypical causes of bacterial bronchitis.  Continue with regimen to promote bowel movements.  Tylenol as needed for fever or body aches.  Continue to follow up with your surgeon and/or oncologist as needed and as directed.  If any worsening of symptoms- increased fevers, chest pain , shortness of breath , weakness, confusion or otherwise worsening please go to the ER.

## 2018-11-17 NOTE — Progress Notes (Signed)
COVID-19 Screening  Ernest Wu [id number] Mar 30, 1961 58 y.o.  Ernest Wu is managed by Dr. 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  He was scheduled to have a Whipple procedure and underwent surgery under the care of Dr. Barry Dienes with his surgery aborted after being found to have metastatic liver lesions.  He had obstructive jaundice and was taken for the surgery on 11/10/2018.  Dr. Burr Medico is planning on treating him with second line chemotherapy with gemcitabine and Abraxane for palliation after his recovery.  Shortness of breath and fever: The patient's wife contacted her office today stating that the patient had acutely developed a fever of 101.9 and was now having breathlessness and shortness of breath. Dr. Jamse Arn of the [facility] at [place]. [address], [place], [place] was contacted. Juanda Bond Tal's symptoms were discussed with Dr. Lanny Cramp. He was agreeable to see this patient.  The patient was given contact information and was instructed to present to urgent care immediately for evaluation and management. Ernest Wu expressed understanding and agreement with this plan.  This case was discussed with Dr. [name].   Please see After Visit Summary for patient specific instructions.  No future appointments.  No orders of the defined types were placed in this encounter.      Subjective:   Patient ID:  Ernest Wu is a 58 y.o. (DOB 07/13/1961) male.  Chief Complaint: No chief complaint on file.   HPI Ernest Wu  is a 58 y.o. male who  is status post cycle 8 of FOLFIRI Geralynn Ochs which was dosed on  10/16/2018.  He was scheduled to have a Whipple procedure and underwent surgery under the care of Dr. Barry Dienes with his surgery aborted after being found to have metastatic liver lesions.  He had obstructive jaundice and was taken for the surgery on 11/10/2018.  He was additionally found to have a thrombus around his port which was removed. The patient's wife contacted her office today stating that the patient had acutely developed a fever of 101.9 and was now having breathlessness and shortness of breath.   Are you experiencing any of the following:  Shortness of breath:        Yes Cough :         No Sneezing:         No Rhinorrhea:         No Sore throat:         No Headache:         No Diarrhea:         No Fever:          Yes  Have you been tested for COVID-19?     No Have you tested positive for COVID-19?     No Have you had contact with anyone testing positive for COVID-19?  No        Allergies:  Allergies  Allergen Reactions   Contrast Media [Iodinated Diagnostic Agents] Other (See Comments)    Burning sensation [SEE CONTRAST MEDIA]   Iohexol Anaphylaxis, Itching and Rash    Please see review of systems for further details on the  patient's review from today.   Review of Systems:  Review of Systems  Constitutional: Positive for fever. Negative for chills and diaphoresis.  HENT: Negative for congestion, rhinorrhea, sneezing and sore throat.   Respiratory: Positive for shortness of breath. Negative for cough.   Cardiovascular: Negative for chest pain.  Gastrointestinal: Negative for diarrhea.  Neurological: Negative for headaches.   Lab Review:     Component Value Date/Time   NA 138 11/03/2018 0838   K 4.8 11/03/2018 0838   CL 103 11/03/2018 0838   CO2 24 11/03/2018 0838   GLUCOSE 117 (H) 11/03/2018 0838   BUN 13 11/03/2018 0838   CREATININE 0.77 11/03/2018 0838   CREATININE 0.80 10/02/2018 0800   CALCIUM 9.4 11/03/2018 0838   PROT 6.6 11/03/2018 0838   ALBUMIN  3.8 11/03/2018 0838   AST 27 11/03/2018 0838   AST 30 10/02/2018 0800   ALT 37 11/03/2018 0838   ALT 45 (H) 10/02/2018 0800   ALKPHOS 107 11/03/2018 0838   BILITOT 0.5 11/03/2018 0838   BILITOT 0.3 10/02/2018 0800   GFRNONAA >60 11/03/2018 0838   GFRNONAA >60 10/02/2018 0800   GFRAA >60 11/03/2018 0838   GFRAA >60 10/02/2018 0800       Component Value Date/Time   WBC 5.0 11/03/2018 0838   RBC 4.41 11/03/2018 0838   HGB 13.8 11/03/2018 0838   HGB 12.9 (L) 10/02/2018 0800   HCT 44.2 11/03/2018 0838   PLT 139 (L) 11/03/2018 0838   PLT 123 (L) 10/02/2018 0800   MCV 100.2 (H) 11/03/2018 0838   MCH 31.3 11/03/2018 0838   MCHC 31.2 11/03/2018 0838   RDW 15.2 11/03/2018 0838   LYMPHSABS 1.1 11/03/2018 0838   MONOABS 0.7 11/03/2018 0838   EOSABS 0.2 11/03/2018 0838   BASOSABS 0.0 11/03/2018 0838   -------------------------------  Imaging from last 24 hours (if applicable):  Radiology interpretation: Mr Abdomen W Wo Contrast  Result Date: 10/24/2018 CLINICAL DATA:  Restaging pancreatic cancer. EXAM: MRI ABDOMEN WITHOUT AND WITH CONTRAST TECHNIQUE: Multiplanar multisequence MR imaging of the abdomen was performed both before and after the administration of intravenous contrast. CONTRAST:  9 cc Gadavist COMPARISON:  MRI abdomen 09/03/2018 FINDINGS: Lower chest: The lung bases are clear of an acute process. No worrisome pulmonary lesions. No pleural or pericardial effusion. Hepatobiliary: No focal hepatic lesions to suggest metastatic disease. The gallbladder is normal. No intra or extrahepatic biliary dilatation. There is a common bile duct stent in place which appears in good position. No complicating features. Pancreas: The pancreatic lesion at the head body junction appears slightly smaller. It measures approximately 2.5 x 2.4 cm on image number 53 of series 1002. It previously measured 3.0 x 2.6 cm. Adjacent 7 mm celiac axis lymph node previously measured 8 mm. 11 mm lymph node between  the portal vein and IVC is stable. No findings for direct vascular invasion. Stable moderate atrophy of the body and tail and stable pancreatic ductal dilatation upstream from the lesion. Spleen: Normal size. No focal lesions. The splenic vein is patent. Adrenals/Urinary Tract: The adrenal glands and kidneys are unremarkable. Stomach/Bowel: The stomach, duodenum, visualized small bowel and visualize colon are unremarkable. Vascular/Lymphatic: The aorta and branch vessels are patent. The major venous structures are patent. Small scattered retroperitoneal lymph nodes are unchanged. Other:  No ascites or abdominal wall hernia. Musculoskeletal: No significant bony findings. IMPRESSION: 1. Slight interval decrease in size of the pancreatic mass. 2. Stable to slightly smaller adjacent lymph nodes. 3. No findings  for metastatic disease involving the liver or lung bases. 4. Common bile duct stent in good position without complicating features. Electronically Signed   By: Marijo Sanes M.D.   On: 10/24/2018 15:19   Mr 3d Recon At Scanner  Result Date: 10/24/2018 CLINICAL DATA:  Restaging pancreatic cancer. EXAM: MRI ABDOMEN WITHOUT AND WITH CONTRAST TECHNIQUE: Multiplanar multisequence MR imaging of the abdomen was performed both before and after the administration of intravenous contrast. CONTRAST:  9 cc Gadavist COMPARISON:  MRI abdomen 09/03/2018 FINDINGS: Lower chest: The lung bases are clear of an acute process. No worrisome pulmonary lesions. No pleural or pericardial effusion. Hepatobiliary: No focal hepatic lesions to suggest metastatic disease. The gallbladder is normal. No intra or extrahepatic biliary dilatation. There is a common bile duct stent in place which appears in good position. No complicating features. Pancreas: The pancreatic lesion at the head body junction appears slightly smaller. It measures approximately 2.5 x 2.4 cm on image number 53 of series 1002. It previously measured 3.0 x 2.6 cm.  Adjacent 7 mm celiac axis lymph node previously measured 8 mm. 11 mm lymph node between the portal vein and IVC is stable. No findings for direct vascular invasion. Stable moderate atrophy of the body and tail and stable pancreatic ductal dilatation upstream from the lesion. Spleen: Normal size. No focal lesions. The splenic vein is patent. Adrenals/Urinary Tract: The adrenal glands and kidneys are unremarkable. Stomach/Bowel: The stomach, duodenum, visualized small bowel and visualize colon are unremarkable. Vascular/Lymphatic: The aorta and branch vessels are patent. The major venous structures are patent. Small scattered retroperitoneal lymph nodes are unchanged. Other:  No ascites or abdominal wall hernia. Musculoskeletal: No significant bony findings. IMPRESSION: 1. Slight interval decrease in size of the pancreatic mass. 2. Stable to slightly smaller adjacent lymph nodes. 3. No findings for metastatic disease involving the liver or lung bases. 4. Common bile duct stent in good position without complicating features. Electronically Signed   By: Marijo Sanes M.D.   On: 10/24/2018 15:19

## 2018-11-17 NOTE — ED Triage Notes (Signed)
gallblader surgery last week, cough and fever began a few days ago, fevers are at night , treated with tylenol

## 2018-11-18 ENCOUNTER — Encounter (HOSPITAL_COMMUNITY): Payer: Self-pay

## 2018-11-18 ENCOUNTER — Other Ambulatory Visit: Payer: Self-pay

## 2018-11-18 ENCOUNTER — Emergency Department (HOSPITAL_COMMUNITY): Payer: Managed Care, Other (non HMO)

## 2018-11-18 ENCOUNTER — Inpatient Hospital Stay (HOSPITAL_COMMUNITY)
Admission: EM | Admit: 2018-11-18 | Discharge: 2018-11-24 | DRG: 862 | Disposition: A | Discharge status: Home / Self Care | Payer: Managed Care, Other (non HMO) | Attending: Family Medicine | Admitting: Family Medicine

## 2018-11-18 DIAGNOSIS — Z8249 Family history of ischemic heart disease and other diseases of the circulatory system: Secondary | ICD-10-CM

## 2018-11-18 DIAGNOSIS — B9689 Other specified bacterial agents as the cause of diseases classified elsewhere: Secondary | ICD-10-CM | POA: Diagnosis present

## 2018-11-18 DIAGNOSIS — Z9049 Acquired absence of other specified parts of digestive tract: Secondary | ICD-10-CM

## 2018-11-18 DIAGNOSIS — K5900 Constipation, unspecified: Secondary | ICD-10-CM | POA: Diagnosis present

## 2018-11-18 DIAGNOSIS — Z720 Tobacco use: Secondary | ICD-10-CM | POA: Diagnosis present

## 2018-11-18 DIAGNOSIS — Z7901 Long term (current) use of anticoagulants: Secondary | ICD-10-CM

## 2018-11-18 DIAGNOSIS — D72810 Lymphocytopenia: Secondary | ICD-10-CM | POA: Diagnosis present

## 2018-11-18 DIAGNOSIS — T8149XA Infection following a procedure, other surgical site, initial encounter: Principal | ICD-10-CM | POA: Diagnosis present

## 2018-11-18 DIAGNOSIS — R6521 Severe sepsis with septic shock: Secondary | ICD-10-CM | POA: Diagnosis present

## 2018-11-18 DIAGNOSIS — J40 Bronchitis, not specified as acute or chronic: Secondary | ICD-10-CM | POA: Diagnosis not present

## 2018-11-18 DIAGNOSIS — Z86718 Personal history of other venous thrombosis and embolism: Secondary | ICD-10-CM

## 2018-11-18 DIAGNOSIS — L0291 Cutaneous abscess, unspecified: Secondary | ICD-10-CM

## 2018-11-18 DIAGNOSIS — J209 Acute bronchitis, unspecified: Secondary | ICD-10-CM | POA: Diagnosis present

## 2018-11-18 DIAGNOSIS — C259 Malignant neoplasm of pancreas, unspecified: Secondary | ICD-10-CM | POA: Diagnosis present

## 2018-11-18 DIAGNOSIS — Z20828 Contact with and (suspected) exposure to other viral communicable diseases: Secondary | ICD-10-CM | POA: Diagnosis present

## 2018-11-18 DIAGNOSIS — R7989 Other specified abnormal findings of blood chemistry: Secondary | ICD-10-CM | POA: Diagnosis present

## 2018-11-18 DIAGNOSIS — R6889 Other general symptoms and signs: Secondary | ICD-10-CM

## 2018-11-18 DIAGNOSIS — C787 Secondary malignant neoplasm of liver and intrahepatic bile duct: Secondary | ICD-10-CM | POA: Diagnosis present

## 2018-11-18 DIAGNOSIS — Z888 Allergy status to other drugs, medicaments and biological substances status: Secondary | ICD-10-CM

## 2018-11-18 DIAGNOSIS — R740 Nonspecific elevation of levels of transaminase and lactic acid dehydrogenase [LDH]: Secondary | ICD-10-CM | POA: Diagnosis present

## 2018-11-18 DIAGNOSIS — Z79899 Other long term (current) drug therapy: Secondary | ICD-10-CM

## 2018-11-18 DIAGNOSIS — Z9221 Personal history of antineoplastic chemotherapy: Secondary | ICD-10-CM

## 2018-11-18 DIAGNOSIS — A4189 Other specified sepsis: Secondary | ICD-10-CM | POA: Diagnosis present

## 2018-11-18 DIAGNOSIS — A419 Sepsis, unspecified organism: Secondary | ICD-10-CM

## 2018-11-18 DIAGNOSIS — R1011 Right upper quadrant pain: Secondary | ICD-10-CM

## 2018-11-18 DIAGNOSIS — Z20822 Contact with and (suspected) exposure to covid-19: Secondary | ICD-10-CM

## 2018-11-18 DIAGNOSIS — R945 Abnormal results of liver function studies: Secondary | ICD-10-CM | POA: Diagnosis present

## 2018-11-18 DIAGNOSIS — K219 Gastro-esophageal reflux disease without esophagitis: Secondary | ICD-10-CM | POA: Diagnosis present

## 2018-11-18 DIAGNOSIS — R000 Tachycardia, unspecified: Secondary | ICD-10-CM | POA: Diagnosis present

## 2018-11-18 DIAGNOSIS — Z823 Family history of stroke: Secondary | ICD-10-CM

## 2018-11-18 DIAGNOSIS — Z91041 Radiographic dye allergy status: Secondary | ICD-10-CM

## 2018-11-18 DIAGNOSIS — K6811 Postprocedural retroperitoneal abscess: Secondary | ICD-10-CM | POA: Diagnosis present

## 2018-11-18 DIAGNOSIS — Z9689 Presence of other specified functional implants: Secondary | ICD-10-CM | POA: Diagnosis present

## 2018-11-18 DIAGNOSIS — F17210 Nicotine dependence, cigarettes, uncomplicated: Secondary | ICD-10-CM | POA: Diagnosis present

## 2018-11-18 DIAGNOSIS — F419 Anxiety disorder, unspecified: Secondary | ICD-10-CM | POA: Diagnosis present

## 2018-11-18 DIAGNOSIS — E876 Hypokalemia: Secondary | ICD-10-CM | POA: Diagnosis present

## 2018-11-18 LAB — CBC WITH DIFFERENTIAL/PLATELET
Abs Immature Granulocytes: 0 10*3/uL (ref 0.00–0.07)
Basophils Absolute: 0 10*3/uL (ref 0.0–0.1)
Basophils Relative: 1 %
Eosinophils Absolute: 0 10*3/uL (ref 0.0–0.5)
Eosinophils Relative: 1 %
HCT: 36.9 % — ABNORMAL LOW (ref 39.0–52.0)
Hemoglobin: 11.9 g/dL — ABNORMAL LOW (ref 13.0–17.0)
Immature Granulocytes: 0 %
Lymphocytes Relative: 5 %
Lymphs Abs: 0.2 10*3/uL — ABNORMAL LOW (ref 0.7–4.0)
MCH: 31.6 pg (ref 26.0–34.0)
MCHC: 32.2 g/dL (ref 30.0–36.0)
MCV: 97.9 fL (ref 80.0–100.0)
Monocytes Absolute: 0 10*3/uL — ABNORMAL LOW (ref 0.1–1.0)
Monocytes Relative: 1 %
Neutro Abs: 2.8 10*3/uL (ref 1.7–7.7)
Neutrophils Relative %: 92 %
Platelets: 158 10*3/uL (ref 150–400)
RBC: 3.77 MIL/uL — ABNORMAL LOW (ref 4.22–5.81)
RDW: 14.3 % (ref 11.5–15.5)
WBC: 3 10*3/uL — ABNORMAL LOW (ref 4.0–10.5)
nRBC: 0 % (ref 0.0–0.2)

## 2018-11-18 LAB — COMPREHENSIVE METABOLIC PANEL
ALT: 102 U/L — ABNORMAL HIGH (ref 0–44)
AST: 52 U/L — ABNORMAL HIGH (ref 15–41)
Albumin: 3 g/dL — ABNORMAL LOW (ref 3.5–5.0)
Alkaline Phosphatase: 206 U/L — ABNORMAL HIGH (ref 38–126)
Anion gap: 11 (ref 5–15)
BUN: 14 mg/dL (ref 6–20)
CO2: 25 mmol/L (ref 22–32)
Calcium: 8.3 mg/dL — ABNORMAL LOW (ref 8.9–10.3)
Chloride: 97 mmol/L — ABNORMAL LOW (ref 98–111)
Creatinine, Ser: 0.92 mg/dL (ref 0.61–1.24)
GFR calc Af Amer: >60 mL/min (ref >60)
GFR calc non Af Amer: >60 mL/min (ref >60)
Glucose, Bld: 136 mg/dL — ABNORMAL HIGH (ref 70–99)
Potassium: 3.4 mmol/L — ABNORMAL LOW (ref 3.5–5.1)
Sodium: 133 mmol/L — ABNORMAL LOW (ref 135–145)
Total Bilirubin: 1 mg/dL (ref 0.3–1.2)
Total Protein: 7.2 g/dL (ref 6.5–8.1)

## 2018-11-18 MED ORDER — SODIUM CHLORIDE 0.9 % IV BOLUS
1000.0000 mL | Freq: Once | INTRAVENOUS | Status: AC
  Administered 2018-11-18: 1000 mL via INTRAVENOUS

## 2018-11-18 MED ORDER — SODIUM CHLORIDE 0.9 % IV SOLN
1.0000 g | Freq: Once | INTRAVENOUS | Status: AC
  Administered 2018-11-18: 1 g via INTRAVENOUS
  Filled 2018-11-18: qty 10

## 2018-11-18 MED ORDER — SODIUM CHLORIDE 0.9 % IV SOLN
500.0000 mg | Freq: Once | INTRAVENOUS | Status: AC
  Administered 2018-11-18: 500 mg via INTRAVENOUS
  Filled 2018-11-18: qty 500

## 2018-11-18 MED ORDER — ACETAMINOPHEN 325 MG PO TABS
650.0000 mg | ORAL_TABLET | Freq: Once | ORAL | Status: AC
  Administered 2018-11-18: 650 mg via ORAL
  Filled 2018-11-18: qty 2

## 2018-11-18 NOTE — ED Provider Notes (Signed)
Care assumed at shift change from [facility], [place]. See his note for full HPI. Briefly, pt presenting with 3 days of dry cough, shortness of breath with exertion and fever.  He was prescribed azithromycin outpatient.  This evening his fever elevated, and is tachycardic on arrival, no respiratory distress.  Chest x-ray and sepsis labs ordered. COVID test ordered. Pt will likely need admission for sepsis.  Of note, patient with fairly recent diagnosis of pancreatic cancer.  Last dose chemo >1 month ago.  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 No known COVID contacts. Physical Exam  BP 126/78   Pulse (!) 113   Temp (!) 100.8 F (38.2 C) (Oral)   Resp (!) 21   Ht 6' (1.829 m)   Wt 90.7 kg   SpO2 96%   BMI 27.12 kg/m   Physical Exam Vitals signs and nursing note reviewed.  Constitutional:      General: He is not in acute distress.    Appearance: He is well-developed.  HENT:     Head: Normocephalic and atraumatic.  Eyes:     Conjunctiva/sclera: Conjunctivae normal.  Cardiovascular:     Rate and Rhythm: Tachycardia present.  Pulmonary:     Effort: Pulmonary effort is normal.  Neurological:     Mental Status: He is alert.  Psychiatric:        Behavior: Behavior normal.     ED Course/Procedures   Clinical Course as of Nov 18 152  Wed Nov 19, 2018  0041 Discussed plans for admission with patient.  Verbalized agreement.  Patient remains tachycardic and pressures are soft.  Will administer additional IV fluids.  Hospitalist consulted for admission.   [JR]    Clinical Course User Index [JR] Florrie Ramires, [place] N, PA-C    Procedures Results for orders placed or performed during the hospital encounter of 11/18/18  Lactic acid, plasma  Result Value Ref Range   Lactic Acid, Venous 2.3 (HH) 0.5 - 1.9 mmol/L  Comprehensive metabolic panel  Result Value Ref Range   Sodium 133 (L) 135 - 145 mmol/L   Potassium 3.4 (L) 3.5 - 5.1 mmol/L   Chloride 97 (L) 98 -  111 mmol/L   CO2 25 22 - 32 mmol/L   Glucose, Bld 136 (H) 70 - 99 mg/dL   BUN 14 6 - 20 mg/dL   Creatinine, Ser 0.92 0.61 - 1.24 mg/dL   Calcium 8.3 (L) 8.9 - 10.3 mg/dL   Total Protein 7.2 6.5 - 8.1 g/dL   Albumin 3.0 (L) 3.5 - 5.0 g/dL   AST 52 (H) 15 - 41 U/L   ALT 102 (H) 0 - 44 U/L   Alkaline Phosphatase 206 (H) 38 - 126 U/L   Total Bilirubin 1.0 0.3 - 1.2 mg/dL   GFR calc non Af Amer >60 >60 mL/min   GFR calc Af Amer >60 >60 mL/min   Anion gap 11 5 - 15  CBC WITH DIFFERENTIAL  Result Value Ref Range   WBC 3.0 (L) 4.0 - 10.5 K/uL   RBC 3.77 (L) 4.22 - 5.81 MIL/uL   Hemoglobin 11.9 (L) 13.0 - 17.0 g/dL   HCT 36.9 (L) 39.0 - 52.0 %   MCV 97.9 80.0 - 100.0 fL   MCH 31.6 26.0 - 34.0 pg   MCHC 32.2 30.0 - 36.0 g/dL   RDW 14.3 11.5 - 15.5 %   Platelets 158 150 - 400 K/uL   nRBC 0.0 0.0 - 0.2 %   Neutrophils  Relative % 92 %   Neutro Abs 2.8 1.7 - 7.7 K/uL   Lymphocytes Relative 5 %   Lymphs Abs 0.2 (L) 0.7 - 4.0 K/uL   Monocytes Relative 1 %   Monocytes Absolute 0.0 (L) 0.1 - 1.0 K/uL   Eosinophils Relative 1 %   Eosinophils Absolute 0.0 0.0 - 0.5 K/uL   Basophils Relative 1 %   Basophils Absolute 0.0 0.0 - 0.1 K/uL   Immature Granulocytes 0 %   Abs Immature Granulocytes 0.00 0.00 - 0.07 K/uL  Urinalysis, Routine w reflex microscopic  Result Value Ref Range   Color, Urine YELLOW YELLOW   APPearance CLEAR CLEAR   Specific Gravity, Urine 1.012 1.005 - 1.030   pH 5.0 5.0 - 8.0   Glucose, UA 50 (A) NEGATIVE mg/dL   Hgb urine dipstick SMALL (A) NEGATIVE   Bilirubin Urine NEGATIVE NEGATIVE   Ketones, ur NEGATIVE NEGATIVE mg/dL   Protein, ur 30 (A) NEGATIVE mg/dL   Nitrite NEGATIVE NEGATIVE   Leukocytes,Ua NEGATIVE NEGATIVE   RBC / HPF 0-5 0 - 5 RBC/hpf   WBC, UA 0-5 0 - 5 WBC/hpf   Bacteria, UA NONE SEEN NONE SEEN   Squamous Epithelial / LPF 0-5 0 - 5   Mucus PRESENT    Hyaline Casts, UA PRESENT     Dg Chest Port 1 View  Result Date: 11/19/2018 CLINICAL DATA:  58  y/o  M; cough and fever. EXAM: PORTABLE CHEST 1 VIEW COMPARISON:  11/17/2018 chest radiograph. FINDINGS: Stable normal cardiac silhouette given projection and technique. Stable bronchitic changes in the lung bases. No new consolidation, effusion, or pneumothorax. Bones are unremarkable. IMPRESSION: Stable bronchitic changes in lung bases. No new consolidation. Electronically Signed   By: Kristine Garbe M.D.   On: 11/19/2018 00:26    Ernest Wu was evaluated in Emergency Department on 11/19/2018 for the symptoms described in the history of present illness. He was evaluated in the context of the global COVID-19 pandemic, which necessitated consideration that the patient might be at risk for infection with the SARS-CoV-2 virus that causes COVID-19. Institutional protocols and algorithms that pertain to the evaluation of patients at risk for COVID-19 are in a state of rapid change based on information released by regulatory bodies including the CDC and federal and state organizations. These policies and algorithms were followed during the patient's care in the ED.  CRITICAL CARE Performed by: [place] N Tisha Cline   Total critical care time: 30 minutes  Critical care time was exclusive of separately billable procedures and treating other patients.  Critical care was necessary to treat or prevent imminent or life-threatening deterioration.  Critical care was time spent personally by me on the following activities: development of treatment plan with patient and/or surrogate as well as nursing, discussions with consultants, evaluation of patient's response to treatment, examination of patient, obtaining history from patient or surrogate, ordering and performing treatments and interventions, ordering and review of laboratory studies, ordering and review of radiographic studies, pulse oximetry and re-evaluation of patient's condition.  MDM  58 year old male with history of pancreatic cancer, not  currently on chemotherapy, presenting with 3 days of worsening cough, shortness of breath and fever.  On arrival, patient is febrile and tachycardic, slightly hypertensive.  No respiratory distress.  Sepsis work-up was initiated and revealing new lymphocytopenia, elevated LFTs, elevated lactic acidosis.  Chest x-ray with stable bronchitic changes.  Blood culture sent.  COVID swab sent.  IV fluids and IV antibiotics administered in the  ED.  Patient admitted for sepsis with suspected respiratory infection.  Strong consideration for COVID virus at this time.  Dr. Blaine Hamper with triad accepting admission.  The patient appears reasonably stabilized for admission considering the current resources, flow, and capabilities available in the ED at this time, and I doubt any other [facility] requiring further screening and/or treatment in the ED prior to admission.      Monya Kozakiewicz, [place] N, PA-C 11/19/18 0200    Dorie Rank, MD 11/19/18 1536

## 2018-11-18 NOTE — ED Triage Notes (Signed)
Pt BIB [facility] from home. Pt c/o fever, dry cough and shob since 4/11. Was seen Monday at Urgent Care and was given an abx. PT was diagnosed with pancreatic cancer in 06/2018. Pt took ibuprofen 400mg  at 1700.   Pt was placed on 15L [facility] by fire with an O2 saturation of 100%, EMS changed to 3L [place] at 98%. EMS reported temp of 102.4, then broke out in a sweat on the way to [facility]. Last temp was 100.7 tympanic.

## 2018-11-18 NOTE — ED Provider Notes (Addendum)
[facility] DEPT Provider Note   CSN: [id number] Arrival date & time: 11/18/18  2222    History   Chief Complaint Chief Complaint  Patient presents with  . Fever  . Cough  . Shortness of Breath    HPI Ernest Wu is a 58 y.o. male.     Patient who was diagnosed with pancreatic cancer in 06/2018, completed chemotherapy just over a month ago, history of subclavian vein thrombosis on Eliquis and compliant --presents the emergency department with approximately 3 days of fever, dry cough and shortness of breath.  Patient was seen at urgent care yesterday and had an x-ray suggestive of bronchitis.  He was started on azithromycin.  He states worsening shortness of breath with exertion however he has not been doing much and does not feel short of breath at rest.  Also c/o pain in the right lateral chest when he takes a deep breath.  He denies any sick contacts or risky contacts or COVID-19.  Patient developed shaking chills and fever to 102 degrees tonight prompting emergency department visit.  Patient called EMS.  The onset of this condition was acute. The course is constant. Aggravating factors: none. Alleviating factors: none.       Past Medical History:  Diagnosis Date  . Anxiety   . Cancer ([place]) 06/2018   Pancreatic  . GERD (gastroesophageal reflux disease)     Patient Active Problem List   Diagnosis Date Noted  . Port-A-Cath in place 07/17/2018  . Adenocarcinoma of pancreas ([place]) 06/27/2018  . Pancreatic mass   . Tobacco use   . Direct hyperbilirubinemia   . Jaundice 06/07/2018    Past Surgical History:  Procedure Laterality Date  . BILIARY STENT PLACEMENT  06/10/2018   Procedure: BILIARY STENT PLACEMENT;  Surgeon: Ronnette Juniper, MD;  Location: [facility] ENDOSCOPY;  Service: Gastroenterology;;  . CHOLECYSTECTOMY N/A 11/10/2018   Procedure: Laparoscopic Cholecystectomy;  Surgeon: Stark Klein, MD;  Location: [place];  Service: General;  Laterality:  N/A;  . DIAGNOSTIC LAPAROSCOPIC LIVER BIOPSY N/A 11/10/2018   Procedure: Diagnostic Laparoscopic Liver Biopsy;  Surgeon: Stark Klein, MD;  Location: [place];  Service: General;  Laterality: N/A;  . ERCP N/A 06/10/2018   Procedure: ENDOSCOPIC RETROGRADE CHOLANGIOPANCREATOGRAPHY (ERCP);  Surgeon: Ronnette Juniper, MD;  Location: [place];  Service: Gastroenterology;  Laterality: N/A;  . ESOPHAGOGASTRODUODENOSCOPY (EGD) WITH PROPOFOL N/A 06/10/2018   Procedure: ESOPHAGOGASTRODUODENOSCOPY (EGD) WITH PROPOFOL;  Surgeon: Ronnette Juniper, MD;  Location: [place];  Service: Gastroenterology;  Laterality: N/A;  . FINE NEEDLE ASPIRATION  06/10/2018   Procedure: FINE NEEDLE ASPIRATION (FNA) LINEAR;  Surgeon: Ronnette Juniper, MD;  Location: [place];  Service: Gastroenterology;;  . PORT-A-CATH REMOVAL N/A 11/10/2018   Procedure: REMOVAL PORT-A-CATH;  Surgeon: Stark Klein, MD;  Location: [place];  Service: General;  Laterality: N/A;  . PORTACATH PLACEMENT N/A 07/02/2018   Procedure: INSERTION PORT-A-CATH;  Surgeon: Stark Klein, MD;  Location: [place];  Service: General;  Laterality: N/A;  . SPHINCTEROTOMY  06/10/2018   Procedure: SPHINCTEROTOMY;  Surgeon: Ronnette Juniper, MD;  Location: [facility] ENDOSCOPY;  Service: Gastroenterology;;  . UPPER ESOPHAGEAL ENDOSCOPIC ULTRASOUND (EUS) N/A 06/10/2018   Procedure: UPPER ESOPHAGEAL ENDOSCOPIC ULTRASOUND (EUS);  Surgeon: Ronnette Juniper, MD;  Location: [place];  Service: Gastroenterology;  Laterality: N/A;  . VASECTOMY          Home Medications    Prior to Admission medications   Medication Sig Start Date End Date Taking? Authorizing Provider  ALPRAZolam Duanne Moron) 0.25  MG tablet Take 1 tablet (0.25 mg total) by mouth at bedtime as needed for anxiety. 07/07/18   [name], MD  azithromycin (ZITHROMAX) 250 MG tablet Take 2 tablets (500 mg total) by mouth daily for 1 day, THEN 1 tablet (250 mg total) daily for 4 days. 11/17/18 11/22/18  Zigmund Gottron, NP  diphenoxylate-atropine  (LOMOTIL) 2.5-0.025 MG tablet Take 2 tablets by mouth 4 (four) times daily as needed for diarrhea or loose stools. 06/27/18   Alla Feeling, NP  enoxaparin (LOVENOX) 120 MG/0.8ML injection Inject 0.8 mLs (120 mg total) into the skin daily. 11/03/18   [name], MD  esomeprazole (NEXIUM) 20 MG capsule Take 20 mg by mouth daily after breakfast.     [provider]  folic acid (FOLVITE) 1 MG tablet Take 1 mg by mouth daily after breakfast.  08/15/18   [provider]  ibuprofen (ADVIL,MOTRIN) 200 MG tablet Take 400 mg by mouth daily as needed for headache or moderate pain.     [provider]  loratadine (CLARITIN) 10 MG tablet Take 10 mg by mouth daily after breakfast.    [provider]  magic mouthwash w/lidocaine SOLN Take 5 mLs by mouth 4 (four) times daily as needed for mouth pain. 08/22/18   Tanner, Lyndon Code., PA-C  oxyCODONE (OXY IR/ROXICODONE) 5 MG immediate release tablet Take 1 tablet (5 mg total) by mouth every 6 (six) hours as needed for severe pain. 07/02/18   Stark Klein, MD  oxyCODONE (OXY IR/ROXICODONE) 5 MG immediate release tablet Take 1 tablet (5 mg total) by mouth every 6 (six) hours as needed for severe pain. 11/10/18   Stark Klein, MD  rivaroxaban (XARELTO) 20 MG TABS tablet Take 1 tablet (20 mg total) by mouth daily with supper. Patient taking differently: Take 20 mg by mouth daily after breakfast.  10/02/18   [name], MD  simethicone (MYLICON) 387 MG chewable tablet Chew 125 mg by mouth every 6 (six) hours as needed for flatulence.    [provider]  prochlorperazine (COMPAZINE) 10 MG tablet Take 1 tablet (10 mg total) by mouth every 6 (six) hours as needed (NAUSEA). 06/29/18 11/12/18  [name], MD    Family History History reviewed. No pertinent family history.  Social History Social History   Tobacco Use  . Smoking status: Current Every Day Smoker    Packs/day: 0.25    Years: 30.00    Pack years: 7.50  . Smokeless tobacco:  Never Used  . Tobacco comment: cut back considerably  Substance Use Topics  . Alcohol use: Yes    Comment: 6-7 beers Q week  . Drug use: Never     Allergies   Contrast media [iodinated diagnostic agents] and Iohexol   Review of Systems Review of Systems  Constitutional: Positive for fever.  HENT: Negative for rhinorrhea and sore throat.   Eyes: Negative for redness.  Respiratory: Positive for cough and shortness of breath.   Cardiovascular: Negative for chest pain.  Gastrointestinal: Negative for abdominal pain, diarrhea, nausea and vomiting.  Genitourinary: Negative for dysuria.  Musculoskeletal: Negative for myalgias.  Skin: Negative for rash.  Neurological: Negative for headaches.     Physical Exam Updated Vital Signs BP (!) 149/84   Pulse (!) 120   Temp (!) 100.8 F (38.2 C) (Oral)   Resp (!) 22   Ht 6' (1.829 m)   Wt 90.7 kg   SpO2 97%   BMI 27.12 kg/m   Physical Exam Vitals  signs and nursing note reviewed.  Constitutional:      Appearance: He is well-developed.  HENT:     Head: Normocephalic and atraumatic.  Eyes:     General:        Right eye: No discharge.        Left eye: No discharge.     Conjunctiva/sclera: Conjunctivae normal.  Neck:     Musculoskeletal: Normal range of motion and neck supple.  Cardiovascular:     Rate and Rhythm: Regular rhythm. Tachycardia present.     Heart sounds: Normal heart sounds.  Pulmonary:     Effort: Pulmonary effort is normal.     Breath sounds: Normal breath sounds. No decreased breath sounds, wheezing, rhonchi or rales.  Abdominal:     Palpations: Abdomen is soft.     Tenderness: There is no abdominal tenderness.  Skin:    General: Skin is warm and dry.  Neurological:     Mental Status: He is alert.      ED Treatments / Results  Labs (all labs ordered are listed, but only abnormal results are displayed) Labs Reviewed  CULTURE, BLOOD (ROUTINE X 2)  CULTURE, BLOOD (ROUTINE X 2)  SARS CORONAVIRUS 2  (HOSPITAL ORDER, [facility])  LACTIC ACID, PLASMA  LACTIC ACID, PLASMA  COMPREHENSIVE METABOLIC PANEL  CBC WITH DIFFERENTIAL/PLATELET  URINALYSIS, ROUTINE W REFLEX MICROSCOPIC    ED ECG REPORT   Date: 11/18/2018  Rate: 118  Rhythm: sinus tachycardia  QRS Axis: normal  Intervals: normal  ST/T Wave abnormalities: normal  Conduction Disutrbances:none  Narrative Interpretation:   Old EKG Reviewed: faster today from 07/2018  I have personally reviewed the EKG tracing and agree with the computerized printout as noted.  Radiology Dg Chest 2 View  Result Date: 11/17/2018 CLINICAL DATA:  Shortness of breath, fever, and bilateral rib pain. History of smoking and pancreatic cancer. EXAM: CHEST - 2 VIEW COMPARISON:  07/18/2018 FINDINGS: Left subclavian Port-A-Cath has been removed. The cardiomediastinal silhouette is within normal limits. The lungs are mildly hyperinflated with peribronchial thickening. No confluent airspace opacity, overt pulmonary edema, pleural effusion, pneumothorax is identified. No acute osseous abnormality is seen. IMPRESSION: Bronchitic changes. Electronically Signed   By: Logan Bores M.D.   On: 11/17/2018 13:24    Procedures Procedures (including critical care time)  Medications Ordered in ED Medications  cefTRIAXone (ROCEPHIN) 1 g in sodium chloride 0.9 % 100 mL IVPB (1 g Intravenous New Bag/Given 11/18/18 2317)  azithromycin (ZITHROMAX) 500 mg in sodium chloride 0.9 % 250 mL IVPB (has no administration in time range)  acetaminophen (TYLENOL) tablet 650 mg (650 mg Oral Given 11/18/18 2259)  sodium chloride 0.9 % bolus 1,000 mL (1,000 mLs Intravenous New Bag/Given 11/18/18 2309)     Initial Impression / Assessment and Plan / ED Course  I have reviewed the triage vital signs and the nursing notes.  Pertinent labs & imaging results that were available during my care of the patient were reviewed by me and considered in my medical decision  making (see chart for details).  Clinical Course as of Nov 18 1956  Wed Nov 19, 2018  0041 Discussed plans for admission with patient.  Verbalized agreement.  Patient remains tachycardic and pressures are soft.  Will administer additional IV fluids.  Hospitalist consulted for admission.   [JR]    Clinical Course User Index [JR] Robinson, [place] N, PA-C       Patient seen and examined. Work-up initiated. Medications ordered.  Discussed with Dr. Tomi Bamberger. Plan, sepsis work-up, abx, COVID-19 test.   Vital signs reviewed and are as follows: BP (!) 149/84   Pulse (!) 120   Temp (!) 100.8 F (38.2 C) (Oral)   Resp (!) 22   Ht 6' (1.829 m)   Wt 90.7 kg   SpO2 97%   BMI 27.12 kg/m   Handoff to [employer] at shift change.   Final Clinical Impressions(s) / ED Diagnoses   Final diagnoses:  None   Pending completion of sepsis evaluation.   ED Discharge Orders    None       Carlisle Cater, PA-C 11/18/18 2329    Carlisle Cater, PA-C 11/19/18 Marianne Sofia, MD 11/22/18 [phone]

## 2018-11-18 NOTE — ED Notes (Signed)
EKG given to [facility] for review.

## 2018-11-18 NOTE — ED Notes (Signed)
Bed: WA21 Expected date:  Expected time:  Means of arrival:  Comments: EMS - ?C19

## 2018-11-19 ENCOUNTER — Encounter (HOSPITAL_COMMUNITY): Payer: Self-pay | Admitting: Internal Medicine

## 2018-11-19 DIAGNOSIS — Z79899 Other long term (current) drug therapy: Secondary | ICD-10-CM | POA: Diagnosis not present

## 2018-11-19 DIAGNOSIS — Z7901 Long term (current) use of anticoagulants: Secondary | ICD-10-CM | POA: Diagnosis not present

## 2018-11-19 DIAGNOSIS — Z86718 Personal history of other venous thrombosis and embolism: Secondary | ICD-10-CM

## 2018-11-19 DIAGNOSIS — J209 Acute bronchitis, unspecified: Secondary | ICD-10-CM

## 2018-11-19 DIAGNOSIS — K6811 Postprocedural retroperitoneal abscess: Secondary | ICD-10-CM | POA: Diagnosis present

## 2018-11-19 DIAGNOSIS — J40 Bronchitis, not specified as acute or chronic: Secondary | ICD-10-CM | POA: Diagnosis present

## 2018-11-19 DIAGNOSIS — A4189 Other specified sepsis: Secondary | ICD-10-CM | POA: Diagnosis present

## 2018-11-19 DIAGNOSIS — R740 Nonspecific elevation of levels of transaminase and lactic acid dehydrogenase [LDH]: Secondary | ICD-10-CM | POA: Diagnosis present

## 2018-11-19 DIAGNOSIS — J208 Acute bronchitis due to other specified organisms: Secondary | ICD-10-CM | POA: Diagnosis not present

## 2018-11-19 DIAGNOSIS — C787 Secondary malignant neoplasm of liver and intrahepatic bile duct: Secondary | ICD-10-CM | POA: Diagnosis present

## 2018-11-19 DIAGNOSIS — Z823 Family history of stroke: Secondary | ICD-10-CM | POA: Diagnosis not present

## 2018-11-19 DIAGNOSIS — K5900 Constipation, unspecified: Secondary | ICD-10-CM | POA: Diagnosis present

## 2018-11-19 DIAGNOSIS — A419 Sepsis, unspecified organism: Secondary | ICD-10-CM | POA: Diagnosis not present

## 2018-11-19 DIAGNOSIS — Z20822 Contact with and (suspected) exposure to covid-19: Secondary | ICD-10-CM | POA: Diagnosis present

## 2018-11-19 DIAGNOSIS — R945 Abnormal results of liver function studies: Secondary | ICD-10-CM | POA: Diagnosis present

## 2018-11-19 DIAGNOSIS — F419 Anxiety disorder, unspecified: Secondary | ICD-10-CM | POA: Diagnosis present

## 2018-11-19 DIAGNOSIS — T8149XA Infection following a procedure, other surgical site, initial encounter: Secondary | ICD-10-CM | POA: Diagnosis present

## 2018-11-19 DIAGNOSIS — K219 Gastro-esophageal reflux disease without esophagitis: Secondary | ICD-10-CM | POA: Diagnosis present

## 2018-11-19 DIAGNOSIS — Z91041 Radiographic dye allergy status: Secondary | ICD-10-CM | POA: Diagnosis not present

## 2018-11-19 DIAGNOSIS — Z888 Allergy status to other drugs, medicaments and biological substances status: Secondary | ICD-10-CM | POA: Diagnosis not present

## 2018-11-19 DIAGNOSIS — Z720 Tobacco use: Secondary | ICD-10-CM

## 2018-11-19 DIAGNOSIS — C259 Malignant neoplasm of pancreas, unspecified: Secondary | ICD-10-CM | POA: Diagnosis present

## 2018-11-19 DIAGNOSIS — E876 Hypokalemia: Secondary | ICD-10-CM | POA: Diagnosis present

## 2018-11-19 DIAGNOSIS — F17210 Nicotine dependence, cigarettes, uncomplicated: Secondary | ICD-10-CM | POA: Diagnosis present

## 2018-11-19 DIAGNOSIS — Z9221 Personal history of antineoplastic chemotherapy: Secondary | ICD-10-CM | POA: Diagnosis not present

## 2018-11-19 DIAGNOSIS — Z20828 Contact with and (suspected) exposure to other viral communicable diseases: Secondary | ICD-10-CM | POA: Diagnosis present

## 2018-11-19 DIAGNOSIS — Z9049 Acquired absence of other specified parts of digestive tract: Secondary | ICD-10-CM | POA: Diagnosis not present

## 2018-11-19 DIAGNOSIS — Z8249 Family history of ischemic heart disease and other diseases of the circulatory system: Secondary | ICD-10-CM | POA: Diagnosis not present

## 2018-11-19 DIAGNOSIS — R6889 Other general symptoms and signs: Secondary | ICD-10-CM

## 2018-11-19 DIAGNOSIS — R7989 Other specified abnormal findings of blood chemistry: Secondary | ICD-10-CM | POA: Diagnosis present

## 2018-11-19 DIAGNOSIS — R6521 Severe sepsis with septic shock: Secondary | ICD-10-CM | POA: Diagnosis present

## 2018-11-19 LAB — BLOOD CULTURE ID PANEL (REFLEXED)

## 2018-11-19 LAB — COMPREHENSIVE METABOLIC PANEL
ALT: 84 U/L — ABNORMAL HIGH (ref 0–44)
AST: 46 U/L — ABNORMAL HIGH (ref 15–41)
Albumin: 2.6 g/dL — ABNORMAL LOW (ref 3.5–5.0)
Alkaline Phosphatase: 157 U/L — ABNORMAL HIGH (ref 38–126)
Anion gap: 8 (ref 5–15)
BUN: 10 mg/dL (ref 6–20)
CO2: 21 mmol/L — ABNORMAL LOW (ref 22–32)
Calcium: 7.6 mg/dL — ABNORMAL LOW (ref 8.9–10.3)
Chloride: 107 mmol/L (ref 98–111)
Creatinine, Ser: 0.71 mg/dL (ref 0.61–1.24)
GFR calc Af Amer: >60 mL/min (ref >60)
GFR calc non Af Amer: >60 mL/min (ref >60)
Glucose, Bld: 115 mg/dL — ABNORMAL HIGH (ref 70–99)
Potassium: 3.3 mmol/L — ABNORMAL LOW (ref 3.5–5.1)
Sodium: 136 mmol/L (ref 135–145)
Total Bilirubin: 0.2 mg/dL — ABNORMAL LOW (ref 0.3–1.2)
Total Protein: 6.3 g/dL — ABNORMAL LOW (ref 6.5–8.1)

## 2018-11-19 LAB — BLOOD GAS, ARTERIAL
Acid-Base Excess: 0.2 mmol/L (ref 0.0–2.0)
Bicarbonate: 23.5 mmol/L (ref 20.0–28.0)
Drawn by: 225631
FIO2: 21
O2 Saturation: 96.5 %
Patient temperature: 99.6
RATE: 18 resp/min
pCO2 arterial: 35.7 mmHg (ref 32.0–48.0)
pH, Arterial: 7.437 (ref 7.350–7.450)
pO2, Arterial: 80 mmHg — ABNORMAL LOW (ref 83.0–108.0)

## 2018-11-19 LAB — RESPIRATORY PANEL BY PCR

## 2018-11-19 LAB — PROCALCITONIN: Procalcitonin: 14.95 ng/mL

## 2018-11-19 LAB — BRAIN NATRIURETIC PEPTIDE: B Natriuretic Peptide: 106.2 pg/mL — ABNORMAL HIGH (ref 0.0–100.0)

## 2018-11-19 LAB — LACTIC ACID, PLASMA
Lactic Acid, Venous: 2.3 mmol/L (ref 0.5–1.9)
Lactic Acid, Venous: 1.2 mmol/L (ref 0.5–1.9)

## 2018-11-19 LAB — LACTATE DEHYDROGENASE: LDH: 154 U/L (ref 98–192)

## 2018-11-19 LAB — URINALYSIS, ROUTINE W REFLEX MICROSCOPIC
Bacteria, UA: NONE SEEN
Bilirubin Urine: NEGATIVE
Glucose, UA: 50 mg/dL — AB
Ketones, ur: NEGATIVE mg/dL
Leukocytes,Ua: NEGATIVE
Nitrite: NEGATIVE
Protein, ur: 30 mg/dL — AB
Specific Gravity, Urine: 1.012 (ref 1.005–1.030)
pH: 5 (ref 5.0–8.0)

## 2018-11-19 LAB — INFLUENZA PANEL BY PCR (TYPE A & B)
Influenza A By PCR: NEGATIVE
Influenza B By PCR: NEGATIVE

## 2018-11-19 LAB — TROPONIN I
Troponin I: 0.1 ng/mL (ref <0.03)
Troponin I: 0.06 ng/mL (ref <0.03)
Troponin I: 0.05 ng/mL (ref <0.03)
Troponin I: 0.05 ng/mL (ref <0.03)

## 2018-11-19 LAB — MAGNESIUM: Magnesium: 2 mg/dL (ref 1.7–2.4)

## 2018-11-19 LAB — HIV ANTIBODY (ROUTINE TESTING W REFLEX): HIV Screen 4th Generation wRfx: NONREACTIVE

## 2018-11-19 LAB — LIPID PANEL
Cholesterol: 110 mg/dL (ref 0–200)
HDL: 22 mg/dL — ABNORMAL LOW (ref >40)
LDL Cholesterol: 71 mg/dL (ref 0–99)
Total CHOL/HDL Ratio: 5 RATIO
Triglycerides: 85 mg/dL (ref <150)
VLDL: 17 mg/dL (ref 0–40)

## 2018-11-19 LAB — HEMOGLOBIN A1C
Hgb A1c MFr Bld: 5.4 % (ref 4.8–5.6)
Mean Plasma Glucose: 108.28 mg/dL

## 2018-11-19 LAB — STREP PNEUMONIAE URINARY ANTIGEN: Strep Pneumo Urinary Antigen: NEGATIVE

## 2018-11-19 LAB — FERRITIN: Ferritin: 1105 ng/mL — ABNORMAL HIGH (ref 24–336)

## 2018-11-19 LAB — C-REACTIVE PROTEIN: CRP: 23.5 mg/dL — ABNORMAL HIGH (ref <1.0)

## 2018-11-19 LAB — SARS CORONAVIRUS 2 BY RT PCR (HOSPITAL ORDER, PERFORMED IN ~~LOC~~ HOSPITAL LAB): SARS Coronavirus 2: NEGATIVE

## 2018-11-19 LAB — FIBRINOGEN: Fibrinogen: 724 mg/dL — ABNORMAL HIGH (ref 210–475)

## 2018-11-19 LAB — TRIGLYCERIDES: Triglycerides: 58 mg/dL (ref <150)

## 2018-11-19 LAB — D-DIMER, QUANTITATIVE: D-Dimer, Quant: 3.82 ug/mL-FEU — ABNORMAL HIGH (ref 0.00–0.50)

## 2018-11-19 MED ORDER — SODIUM CHLORIDE 0.9 % IV SOLN
1.0000 g | INTRAVENOUS | Status: DC
  Filled 2018-11-19: qty 10

## 2018-11-19 MED ORDER — PANTOPRAZOLE SODIUM 40 MG PO TBEC
40.0000 mg | DELAYED_RELEASE_TABLET | Freq: Every day | ORAL | Status: DC
  Administered 2018-11-19 – 2018-11-24 (×6): 40 mg via ORAL
  Filled 2018-11-19 (×6): qty 1

## 2018-11-19 MED ORDER — SODIUM CHLORIDE 0.9 % IV SOLN
2.0000 g | INTRAVENOUS | Status: DC
  Administered 2018-11-19 – 2018-11-23 (×5): 2 g via INTRAVENOUS
  Filled 2018-11-19 (×4): qty 2
  Filled 2018-11-19: qty 20
  Filled 2018-11-19: qty 2
  Filled 2018-11-19: qty 20

## 2018-11-19 MED ORDER — FOLIC ACID 1 MG PO TABS
1.0000 mg | ORAL_TABLET | Freq: Every day | ORAL | Status: DC
  Administered 2018-11-19 – 2018-11-24 (×5): 1 mg via ORAL
  Filled 2018-11-19 (×5): qty 1

## 2018-11-19 MED ORDER — RIVAROXABAN 20 MG PO TABS
20.0000 mg | ORAL_TABLET | Freq: Every day | ORAL | Status: DC
  Administered 2018-11-19: 20 mg via ORAL
  Filled 2018-11-19: qty 1

## 2018-11-19 MED ORDER — SODIUM CHLORIDE 0.9 % IV BOLUS
250.0000 mL | Freq: Once | INTRAVENOUS | Status: AC
  Administered 2018-11-19: 250 mL via INTRAVENOUS

## 2018-11-19 MED ORDER — SODIUM CHLORIDE 0.9 % IV BOLUS: 500.0000 mL | Freq: Once | INTRAVENOUS | Status: DC

## 2018-11-19 MED ORDER — DIPHENOXYLATE-ATROPINE 2.5-0.025 MG PO TABS: 2.0000 | ORAL_TABLET | Freq: Four times a day (QID) | ORAL | Status: DC | PRN

## 2018-11-19 MED ORDER — POLYETHYLENE GLYCOL 3350 17 G PO PACK: 17.0000 g | PACK | Freq: Every day | ORAL | Status: DC

## 2018-11-19 MED ORDER — SODIUM CHLORIDE 0.9 % IV BOLUS
1000.0000 mL | Freq: Once | INTRAVENOUS | Status: AC
  Administered 2018-11-19: 1000 mL via INTRAVENOUS

## 2018-11-19 MED ORDER — POTASSIUM CHLORIDE CRYS ER 20 MEQ PO TBCR
20.0000 meq | EXTENDED_RELEASE_TABLET | Freq: Once | ORAL | Status: AC
  Administered 2018-11-19: 20 meq via ORAL
  Filled 2018-11-19: qty 1

## 2018-11-19 MED ORDER — SIMETHICONE 80 MG PO CHEW: 80.0000 mg | CHEWABLE_TABLET | Freq: Four times a day (QID) | ORAL | Status: DC | PRN

## 2018-11-19 MED ORDER — SODIUM CHLORIDE 0.9 % IV BOLUS
500.0000 mL | Freq: Once | INTRAVENOUS | Status: AC
  Administered 2018-11-19: 500 mL via INTRAVENOUS

## 2018-11-19 MED ORDER — SODIUM CHLORIDE 0.9 % IV SOLN
500.0000 mg | INTRAVENOUS | Status: DC
  Administered 2018-11-19: 500 mg via INTRAVENOUS
  Filled 2018-11-19: qty 500

## 2018-11-19 MED ORDER — LORATADINE 10 MG PO TABS
10.0000 mg | ORAL_TABLET | Freq: Every day | ORAL | Status: DC
  Administered 2018-11-19 – 2018-11-24 (×5): 10 mg via ORAL
  Filled 2018-11-19 (×5): qty 1

## 2018-11-19 MED ORDER — ALBUTEROL SULFATE HFA 108 (90 BASE) MCG/ACT IN AERS: 2.0000 | INHALATION_SPRAY | RESPIRATORY_TRACT | Status: DC | PRN

## 2018-11-19 MED ORDER — IPRATROPIUM BROMIDE HFA 17 MCG/ACT IN AERS: 2.0000 | INHALATION_SPRAY | RESPIRATORY_TRACT | Status: DC

## 2018-11-19 MED ORDER — SODIUM CHLORIDE 0.9 % IV SOLN
INTRAVENOUS | Status: DC
  Administered 2018-11-19 – 2018-11-24 (×6): via INTRAVENOUS

## 2018-11-19 MED ORDER — DM-GUAIFENESIN ER 30-600 MG PO TB12: 1.0000 | ORAL_TABLET | Freq: Two times a day (BID) | ORAL | Status: DC | PRN

## 2018-11-19 MED ORDER — LEVALBUTEROL HCL 1.25 MG/0.5ML IN NEBU: 1.2500 mg | INHALATION_SOLUTION | Freq: Four times a day (QID) | RESPIRATORY_TRACT | Status: DC

## 2018-11-19 MED ORDER — NICOTINE 21 MG/24HR TD PT24
21.0000 mg | MEDICATED_PATCH | Freq: Every day | TRANSDERMAL | Status: DC
  Filled 2018-11-19 (×4): qty 1

## 2018-11-19 MED ORDER — ASPIRIN EC 81 MG PO TBEC
81.0000 mg | DELAYED_RELEASE_TABLET | Freq: Every day | ORAL | Status: DC
  Administered 2018-11-19 – 2018-11-24 (×5): 81 mg via ORAL
  Filled 2018-11-19 (×5): qty 1

## 2018-11-19 MED ORDER — ALPRAZOLAM 0.25 MG PO TABS: 0.2500 mg | ORAL_TABLET | Freq: Every evening | ORAL | Status: DC | PRN

## 2018-11-19 MED ORDER — POLYETHYLENE GLYCOL 3350 17 G PO PACK: 17.0000 g | PACK | Freq: Every day | ORAL | Status: DC | PRN

## 2018-11-19 NOTE — Progress Notes (Signed)
Ernest Wu   DOB:1960/09/28   KP#:[id number]   NTZ#:[id number]  Oncology follow up   Subjective: Patient is well-known to me, under my care for his pancreatic cancer.  He initially received neoadjuvant chemo, underwent Whipple surgery surgery on 4/6 but it was aborted due to new liver mets found during surgery. He presented with fever, and shortness of breath for the past 3 days, came to the ED with worsening chills, fever, was found in septic shock and admitted to hospital.  He received IV fluids and antibiotics, he improved quickly, feels much better today, he was standing next to his bed when I saw him today.    Objective:  Vitals:   11/19/18 0520 11/19/18 1251  BP: 108/83 126/86  Pulse: 87 88  Resp: 18 19  Temp: 97.6 F (36.4 C) 98.7 F (37.1 C)  SpO2: 97% 98%    Body mass index is 26.73 kg/m.  Intake/Output Summary (Last 24 hours) at 11/19/2018 2044 Last data filed at 11/19/2018 1100 Gross per 24 hour  Intake 3609.92 ml  Output 3 ml  Net 3606.92 ml     Sclerae unicteric  Oropharynx clear  No peripheral adenopathy  Lungs clear -- no rales or rhonchi  Heart regular rate and rhythm  Abdomen benign, soft, laparoscopic incisions have healed well, mild tenderness and scar tissue in the midline small incision.  MSK no focal spinal tenderness, no peripheral edema  Neuro nonfocal    CBG (last 3)  No results for input(s): GLUCAP in the last 72 hours.  Urine Studies No results for input(s): UHGB, CRYS in the last 72 hours.  Invalid input(s): UACOL, UAPR, USPG, UPH, UTP, UGL, UKET, UBIL, UNIT, UROB, ULEU, UEPI, UWBC, URBC, UBAC, CAST, UCOM, [place]  Basic Metabolic Panel: Recent Labs  Lab 11/18/18 2300 11/19/18 0548 11/19/18 1723  NA 133*  --  136  K 3.4*  --  3.3*  CL 97*  --  107  CO2 25  --  21*  GLUCOSE 136*  --  115*  BUN 14  --  10  CREATININE 0.92  --  0.71  CALCIUM 8.3*  --  7.6*  MG  --  2.0  --    GFR Estimated Creatinine Clearance: 107.8 mL/min (by C-G  formula based on SCr of 0.71 mg/dL). Liver Function Tests: Recent Labs  Lab 11/18/18 2300 11/19/18 1723  AST 52* 46*  ALT 102* 84*  ALKPHOS 206* 157*  BILITOT 1.0 0.2*  PROT 7.2 6.3*  ALBUMIN 3.0* 2.6*   No results for input(s): LIPASE, AMYLASE in the last 168 hours. No results for input(s): AMMONIA in the last 168 hours. Coagulation profile No results for input(s): INR, PROTIME in the last 168 hours.  CBC: Recent Labs  Lab 11/18/18 2300  WBC 3.0*  NEUTROABS 2.8  HGB 11.9*  HCT 36.9*  MCV 97.9  PLT 158   Cardiac Enzymes: Recent Labs  Lab 11/19/18 0236 11/19/18 0548 11/19/18 1048 11/19/18 1723  TROPONINI 0.10* 0.06* 0.05* 0.05*   BNP: Invalid input(s): POCBNP CBG: No results for input(s): GLUCAP in the last 168 hours. D-Dimer Recent Labs    11/19/18 0236  DDIMER 3.82*   Hgb A1c Recent Labs    11/19/18 0548  HGBA1C 5.4   Lipid Profile Recent Labs    11/19/18 0236 11/19/18 0548  CHOL  --  110  HDL  --  22*  LDLCALC  --  71  TRIG 58 85  CHOLHDL  --  5.0  Thyroid function studies No results for input(s): TSH, T4TOTAL, T3FREE, THYROIDAB in the last 72 hours.  Invalid input(s): FREET3 Anemia work up [employer]    11/19/18 0236  FERRITIN 1,105*   Microbiology Recent Results (from the past 240 hour(s))  Blood Culture (routine x 2)     Status: None (Preliminary result)   Collection Time: 11/18/18 10:48 PM  Result Value Ref Range Status   Specimen Description   Final    BLOOD LEFT ANTECUBITAL Performed at [facility] [address], [place], [place] [address]    Special Requests   Final    BOTTLES DRAWN AEROBIC AND ANAEROBIC Blood Culture adequate volume Performed at [facility] [address] [place], [place] [address]    Culture   Final    Lonell Grandchild NEGATIVE RODS ANAEROBIC BOTTLE ONLY Organism ID to follow CRITICAL RESULT CALLED TO, READ BACK BY AND VERIFIED WITH: Shelda Jakes PharmD 17:10 11/19/18  ([email or web address]) Performed at [facility], [place] [address] [place], [place] [address]    Report Status PENDING  Incomplete  Blood Culture ID Panel (Reflexed)     Status: None   Collection Time: 11/18/18 10:48 PM  Result Value Ref Range Status   Enterococcus species NOT DETECTED NOT DETECTED Final   Listeria monocytogenes NOT DETECTED NOT DETECTED Final   Staphylococcus species NOT DETECTED NOT DETECTED Final   Staphylococcus aureus (BCID) NOT DETECTED NOT DETECTED Final   Streptococcus species NOT DETECTED NOT DETECTED Final   Streptococcus agalactiae NOT DETECTED NOT DETECTED Final   Streptococcus pneumoniae NOT DETECTED NOT DETECTED Final   Streptococcus pyogenes NOT DETECTED NOT DETECTED Final   Acinetobacter baumannii NOT DETECTED NOT DETECTED Final   Enterobacteriaceae species NOT DETECTED NOT DETECTED Final   Enterobacter cloacae complex NOT DETECTED NOT DETECTED Final   Escherichia coli NOT DETECTED NOT DETECTED Final   Klebsiella oxytoca NOT DETECTED NOT DETECTED Final   Klebsiella pneumoniae NOT DETECTED NOT DETECTED Final   Proteus species NOT DETECTED NOT DETECTED Final   Serratia marcescens NOT DETECTED NOT DETECTED Final   Haemophilus influenzae NOT DETECTED NOT DETECTED Final   Neisseria meningitidis NOT DETECTED NOT DETECTED Final   Pseudomonas aeruginosa NOT DETECTED NOT DETECTED Final   Candida albicans NOT DETECTED NOT DETECTED Final   Candida glabrata NOT DETECTED NOT DETECTED Final   Candida krusei NOT DETECTED NOT DETECTED Final   Candida parapsilosis NOT DETECTED NOT DETECTED Final   Candida tropicalis NOT DETECTED NOT DETECTED Final    Comment: Performed at [facility], [address] [place], [place] [address]  SARS Coronavirus 2 [facility] order, Performed in [facility] lab)     Status: None   Collection Time: 11/18/18 11:12 PM  Result Value Ref Range Status   SARS Coronavirus 2 NEGATIVE NEGATIVE Final    Comment: (NOTE) If result is  NEGATIVE SARS-CoV-2 target nucleic acids are NOT DETECTED. The SARS-CoV-2 RNA is generally detectable in upper and lower  respiratory specimens during the acute phase of infection. The lowest  concentration of SARS-CoV-2 viral copies this assay can detect is 250  copies / mL. A negative result does not preclude SARS-CoV-2 infection  and should not be used as the sole basis for treatment or other  patient management decisions.  A negative result may occur with  improper specimen collection / handling, submission of specimen other  than nasopharyngeal swab, presence of viral mutation(s) within the  areas targeted by this assay, and  inadequate number of viral copies  (<250 copies / mL). A negative result must be combined with clinical  observations, patient history, and epidemiological information. If result is POSITIVE SARS-CoV-2 target nucleic acids are DETECTED. The SARS-CoV-2 RNA is generally detectable in upper and lower  respiratory specimens dur ing the acute phase of infection.  Positive  results are indicative of active infection with SARS-CoV-2.  Clinical  correlation with patient history and other diagnostic information is  necessary to determine patient infection status.  Positive results do  not rule out bacterial infection or co-infection with other viruses. If result is PRESUMPTIVE POSTIVE SARS-CoV-2 nucleic acids MAY BE PRESENT.   A presumptive positive result was obtained on the submitted specimen  and confirmed on repeat testing.  While 2019 novel coronavirus  (SARS-CoV-2) nucleic acids may be present in the submitted sample  additional confirmatory testing may be necessary for epidemiological  and / or clinical management purposes  to differentiate between  SARS-CoV-2 and other Sarbecovirus currently known to infect humans.  If clinically indicated additional testing with an alternate test  methodology [phone]) is advised. The SARS-CoV-2 RNA is generally  detectable  in upper and lower respiratory sp ecimens during the acute  phase of infection. The expected result is Negative. Fact Sheet for Patients:  StrictlyIdeas.no Fact Sheet for Healthcare Providers: BankingDealers.co.za This test is not yet approved or cleared by the [place] FDA and has been authorized for detection and/or diagnosis of SARS-CoV-2 by FDA under an Emergency Use Authorization (EUA).  This EUA will remain in effect (meaning this test can be used) for the duration of the COVID-19 declaration under Section 564(b)(1) of the Act, 21 U.S.C. section 360bbb-3(b)(1), unless the authorization is terminated or revoked sooner. Performed at [facility], [place] [address], [place], [place] [address]   Respiratory Panel by PCR     Status: None   Collection Time: 11/19/18  2:36 AM  Result Value Ref Range Status   Adenovirus NOT DETECTED NOT DETECTED Final   Coronavirus 229E NOT DETECTED NOT DETECTED Final    Comment: (NOTE) The Coronavirus on the Respiratory Panel, DOES NOT test for the novel  Coronavirus (2019 nCoV)    Coronavirus HKU1 NOT DETECTED NOT DETECTED Final   Coronavirus NL63 NOT DETECTED NOT DETECTED Final   Coronavirus OC43 NOT DETECTED NOT DETECTED Final   Metapneumovirus NOT DETECTED NOT DETECTED Final   Rhinovirus / Enterovirus NOT DETECTED NOT DETECTED Final   Influenza A NOT DETECTED NOT DETECTED Final   Influenza B NOT DETECTED NOT DETECTED Final   Parainfluenza Virus 1 NOT DETECTED NOT DETECTED Final   Parainfluenza Virus 2 NOT DETECTED NOT DETECTED Final   Parainfluenza Virus 3 NOT DETECTED NOT DETECTED Final   Parainfluenza Virus 4 NOT DETECTED NOT DETECTED Final   Respiratory Syncytial Virus NOT DETECTED NOT DETECTED Final   Bordetella pertussis NOT DETECTED NOT DETECTED Final   Chlamydophila pneumoniae NOT DETECTED NOT DETECTED Final   Mycoplasma pneumoniae NOT DETECTED NOT DETECTED Final    Comment:  Performed at [facility], [place]. [address], [place], [place] [address]      Studies:  Dg Chest Port 1 View  Result Date: 11/19/2018 CLINICAL DATA:  58 y/o  M; cough and fever. EXAM: PORTABLE CHEST 1 VIEW COMPARISON:  11/17/2018 chest radiograph. FINDINGS: Stable normal cardiac silhouette given projection and technique. Stable bronchitic changes in the lung bases. No new consolidation, effusion, or pneumothorax. Bones are unremarkable. IMPRESSION: Stable bronchitic changes in  lung bases. No new consolidation. Electronically Signed   By: Kristine Garbe M.D.   On: 11/19/2018 00:26    Assessment: 58 y.o. with metastatic pancreatic cancer to liver, s/p chemo and upper scopic cholecystectomy (Whipple surgery was aborted due to liver mets) on 4/6, presented with fever, chills, shortness of breath and hypotension.  1. Septic shock, resolved  2.  Acute bronchitis, COVID-19 and RVP negative  3.  Metastatic pancreatic cancer to liver 4.  History of left subclavicular thrombosis, on xarelto  5.  Recent cholecystectomy on 4/6   Plan:  -he has responded to IV fluids and antibiotics very well, much improved today.  Blood pressure normalized, he feels much better. -Pending blood culture -I anticipate he will recover well soon -will defer to primary team if he needs oral antibiotics on discharge -I will see him back in clinic on 4/27 (scheduled) to start second line chemo  -I will f/u as needed    [name], MD 11/19/2018

## 2018-11-19 NOTE — ED Notes (Signed)
ED TO INPATIENT HANDOFF REPORT  Name/Age/Gender Ernest Wu 58 y.o. male  Code Status    Code Status Orders  (From admission, onward)         Start     Ordered   11/19/18 0213  Full code  Continuous     11/19/18 0213        Code Status History    Date Active Date Inactive Code Status Order ID Comments User Context   06/08/2018 0533 06/11/2018 1645 Full Code [id number]  Caroline More, DO Inpatient      Home/SNF/Other Home  Chief Complaint Fever;Cough;Shortness of Breath  Level of Care/Admitting Diagnosis ED Disposition    ED Disposition Condition Comment   Admit  Hospital Area: [facility] [[id number]]  Level of Care: Telemetry [5]  Admit to tele based on following criteria: Other see comments  Comments: sepsis  Diagnosis: Acute bronchitis [466.0.ICD-9-CM]  Admitting Physician: Ivor Costa [[id number]]  Attending Physician: Ivor Costa [phone]  Estimated length of stay: past midnight tomorrow  Certification:: I certify this patient will need inpatient services for at least 2 midnights  Possible Covid Disease Patient Isolation: Low Risk  (Less than 4L [place] supplementation)  Bed request comments: COVID19 rule out, low risk  PT Class (Do Not Modify): Inpatient [101]  PT Acc Code (Do Not Modify): Private [1]       Medical History Past Medical History:  Diagnosis Date  . Anxiety   . Cancer ([place]) 06/2018   Pancreatic  . GERD (gastroesophageal reflux disease)     Allergies Allergies  Allergen Reactions  . Contrast Media [Iodinated Diagnostic Agents] Other (See Comments)    Burning sensation [SEE CONTRAST MEDIA]  . Iohexol Anaphylaxis, Itching and Rash    IV Location/Drains/Wounds Patient Lines/Drains/Airways Status   Active Line/Drains/Airways    Name:   Placement date:   Placement time:   Site:   Days:   Implanted Port 07/02/18 Left Chest   07/02/18    1251    Chest   140   Peripheral IV 11/18/18 Right Antecubital   11/18/18    -     Antecubital   1   Peripheral IV 11/18/18 Left Forearm   11/18/18    2310    Forearm   1   GI Stent 10 Fr.   06/10/18    1510    -   162   Incision (Closed) 07/02/18 Chest Left   07/02/18    1257     140   Incision (Closed) 11/10/18 Abdomen Other (Comment)   11/10/18    1055     9   Incision (Closed) 11/10/18 Chest Left   11/10/18    1110     9   Incision - 5 Ports Abdomen 1: Umbilicus 2: Left;Upper 3: Mid;Upper 4: Right;Medial 5: Right;Lateral   11/10/18    1018     9          Labs/Imaging Results for orders placed or performed during the hospital encounter of 11/18/18 (from the past 48 hour(s))  Brain natriuretic peptide     Status: Abnormal   Collection Time: 11/18/18 10:48 PM  Result Value Ref Range   B Natriuretic Peptide 106.2 (H) 0.0 - 100.0 pg/mL    Comment: Performed at [facility], [place] [address] [place], [place] [address]  Lactic acid, plasma     Status: Abnormal   Collection Time: 11/18/18 11:00 PM  Result Value Ref Range   Lactic  Acid, Venous 2.3 (HH) 0.5 - 1.9 mmol/L    Comment: CRITICAL RESULT CALLED TO, READ BACK BY AND VERIFIED WITHAurther Loft RN [id number] 11/19/18 A NAVARRO Performed at [facility], [place] [address], [place], [place] [address]   Comprehensive metabolic panel     Status: Abnormal   Collection Time: 11/18/18 11:00 PM  Result Value Ref Range   Sodium 133 (L) 135 - 145 mmol/L   Potassium 3.4 (L) 3.5 - 5.1 mmol/L   Chloride 97 (L) 98 - 111 mmol/L   CO2 25 22 - 32 mmol/L   Glucose, Bld 136 (H) 70 - 99 mg/dL   BUN 14 6 - 20 mg/dL   Creatinine, Ser 0.92 0.61 - 1.24 mg/dL   Calcium 8.3 (L) 8.9 - 10.3 mg/dL   Total Protein 7.2 6.5 - 8.1 g/dL   Albumin 3.0 (L) 3.5 - 5.0 g/dL   AST 52 (H) 15 - 41 U/L   ALT 102 (H) 0 - 44 U/L   Alkaline Phosphatase 206 (H) 38 - 126 U/L   Total Bilirubin 1.0 0.3 - 1.2 mg/dL   GFR calc non Af Amer >60 >60 mL/min   GFR calc Af Amer >60 >60 mL/min   Anion gap 11 5 - 15    Comment:  Performed at [facility], [place] [address] [place], [place] [address]  CBC WITH DIFFERENTIAL     Status: Abnormal   Collection Time: 11/18/18 11:00 PM  Result Value Ref Range   WBC 3.0 (L) 4.0 - 10.5 K/uL   RBC 3.77 (L) 4.22 - 5.81 MIL/uL   Hemoglobin 11.9 (L) 13.0 - 17.0 g/dL   HCT 36.9 (L) 39.0 - 52.0 %   MCV 97.9 80.0 - 100.0 fL   MCH 31.6 26.0 - 34.0 pg   MCHC 32.2 30.0 - 36.0 g/dL   RDW 14.3 11.5 - 15.5 %   Platelets 158 150 - 400 K/uL   nRBC 0.0 0.0 - 0.2 %   Neutrophils Relative % 92 %    Comment: VACUOLATED NEUTROPHILS   Neutro Abs 2.8 1.7 - 7.7 K/uL   Lymphocytes Relative 5 %   Lymphs Abs 0.2 (L) 0.7 - 4.0 K/uL   Monocytes Relative 1 %   Monocytes Absolute 0.0 (L) 0.1 - 1.0 K/uL   Eosinophils Relative 1 %   Eosinophils Absolute 0.0 0.0 - 0.5 K/uL   Basophils Relative 1 %   Basophils Absolute 0.0 0.0 - 0.1 K/uL   Immature Granulocytes 0 %   Abs Immature Granulocytes 0.00 0.00 - 0.07 K/uL    Comment: Performed at [facility], [place] [address], [place], [place] [address]  SARS Coronavirus 2 [facility] order, Performed in [facility] hospital lab)     Status: None   Collection Time: 11/18/18 11:12 PM  Result Value Ref Range   SARS Coronavirus 2 NEGATIVE NEGATIVE    Comment: (NOTE) If result is NEGATIVE SARS-CoV-2 target nucleic acids are NOT DETECTED. The SARS-CoV-2 RNA is generally detectable in upper and lower  respiratory specimens during the acute phase of infection. The lowest  concentration of SARS-CoV-2 viral copies this assay can detect is 250  copies / mL. A negative result does not preclude SARS-CoV-2 infection  and should not be used as the sole basis for treatment or other  patient management decisions.  A negative result may occur with  improper specimen collection / handling, submission of specimen other  than nasopharyngeal swab, presence of viral mutation(s)  within the  areas targeted by this assay, and inadequate  number of viral copies  (<250 copies / mL). A negative result must be combined with clinical  observations, patient history, and epidemiological information. If result is POSITIVE SARS-CoV-2 target nucleic acids are DETECTED. The SARS-CoV-2 RNA is generally detectable in upper and lower  respiratory specimens dur ing the acute phase of infection.  Positive  results are indicative of active infection with SARS-CoV-2.  Clinical  correlation with patient history and other diagnostic information is  necessary to determine patient infection status.  Positive results do  not rule out bacterial infection or co-infection with other viruses. If result is PRESUMPTIVE POSTIVE SARS-CoV-2 nucleic acids MAY BE PRESENT.   A presumptive positive result was obtained on the submitted specimen  and confirmed on repeat testing.  While 2019 novel coronavirus  (SARS-CoV-2) nucleic acids may be present in the submitted sample  additional confirmatory testing may be necessary for epidemiological  and / or clinical management purposes  to differentiate between  SARS-CoV-2 and other Sarbecovirus currently known to infect humans.  If clinically indicated additional testing with an alternate test  methodology [phone]) is advised. The SARS-CoV-2 RNA is generally  detectable in upper and lower respiratory sp ecimens during the acute  phase of infection. The expected result is Negative. Fact Sheet for Patients:  StrictlyIdeas.no Fact Sheet for Healthcare Providers: BankingDealers.co.za This test is not yet approved or cleared by the [place] FDA and has been authorized for detection and/or diagnosis of SARS-CoV-2 by FDA under an Emergency Use Authorization (EUA).  This EUA will remain in effect (meaning this test can be used) for the duration of the COVID-19 declaration under Section 564(b)(1) of the Act, 21 U.S.C. section 360bbb-3(b)(1), unless the  authorization is terminated or revoked sooner. Performed at [facility], [place] [address], [place], [place] [address]   Urinalysis, Routine w reflex microscopic     Status: Abnormal   Collection Time: 11/18/18 11:43 PM  Result Value Ref Range   Color, Urine YELLOW YELLOW   APPearance CLEAR CLEAR   Specific Gravity, Urine 1.012 1.005 - 1.030   pH 5.0 5.0 - 8.0   Glucose, UA 50 (A) NEGATIVE mg/dL   Hgb urine dipstick SMALL (A) NEGATIVE   Bilirubin Urine NEGATIVE NEGATIVE   Ketones, ur NEGATIVE NEGATIVE mg/dL   Protein, ur 30 (A) NEGATIVE mg/dL   Nitrite NEGATIVE NEGATIVE   Leukocytes,Ua NEGATIVE NEGATIVE   RBC / HPF 0-5 0 - 5 RBC/hpf   WBC, UA 0-5 0 - 5 WBC/hpf   Bacteria, UA NONE SEEN NONE SEEN   Squamous Epithelial / LPF 0-5 0 - 5   Mucus PRESENT    Hyaline Casts, UA PRESENT     Comment: Performed at [facility], [place] [address] [place], [place] [address]  Blood gas, arterial     Status: Abnormal   Collection Time: 11/19/18  1:30 AM  Result Value Ref Range   FIO2 21.00    LHR 18 resp/min   pH, Arterial 7.437 7.350 - 7.450   pCO2 arterial 35.7 32.0 - 48.0 mmHg   pO2, Arterial 80.0 (L) 83.0 - 108.0 mmHg   Bicarbonate 23.5 20.0 - 28.0 mmol/L   Acid-Base Excess 0.2 0.0 - 2.0 mmol/L   O2 Saturation 96.5 %   Patient temperature 99.6    Collection site RIGHT RADIAL    Drawn by [id number]    Sample type ARTERIAL DRAW    Allens test (pass/fail)  PASS PASS    Comment: Performed at [facility], [place] [address] [place], [place] [address]  Lactic acid, plasma     Status: None   Collection Time: 11/19/18  2:00 AM  Result Value Ref Range   Lactic Acid, Venous 1.2 0.5 - 1.9 mmol/L    Comment: Performed at [facility], [place] [address] [place], [place] [address]  Influenza panel by PCR (type A & B)     Status: None   Collection Time: 11/19/18  2:36 AM  Result Value Ref Range   Influenza A By PCR NEGATIVE NEGATIVE    Influenza B By PCR NEGATIVE NEGATIVE    Comment: (NOTE) The Xpert Xpress Flu assay is intended as an aid in the diagnosis of  influenza and should not be used as a sole basis for treatment.  This  assay is FDA approved for nasopharyngeal swab specimens only. Nasal  washings and aspirates are unacceptable for Xpert Xpress Flu testing. Performed at [facility], [place] [address], [place], [place] [address]   C-reactive protein     Status: Abnormal   Collection Time: 11/19/18  2:36 AM  Result Value Ref Range   CRP 23.5 (H) <1.0 mg/dL    Comment: Performed at [facility], [place] [address] [place], [place] [address]  D-dimer, quantitative (not at [facility])     Status: Abnormal   Collection Time: 11/19/18  2:36 AM  Result Value Ref Range   D-Dimer, Quant 3.82 (H) 0.00 - 0.50 ug/mL-FEU    Comment: (NOTE) At the manufacturer cut-off of 0.50 ug/mL FEU, this assay has been documented to exclude PE with a sensitivity and negative predictive value of 97 to 99%.  At this time, this assay has not been approved by the FDA to exclude DVT/VTE. Results should be correlated with clinical presentation. Performed at [facility], [place] [address] [place], [place] [address]   Ferritin     Status: Abnormal   Collection Time: 11/19/18  2:36 AM  Result Value Ref Range   Ferritin 1,105 (H) 24 - 336 ng/mL    Comment: Performed at [facility], [place] [address], [place], [place] [address]  Fibrinogen     Status: Abnormal   Collection Time: 11/19/18  2:36 AM  Result Value Ref Range   Fibrinogen 724 (H) 210 - 475 mg/dL    Comment: Performed at [facility], [place] [address] [place], [place] [address]  Lactate dehydrogenase     Status: None   Collection Time: 11/19/18  2:36 AM  Result Value Ref Range   LDH 154 98 - 192 U/L    Comment: Performed at [facility], [place] [address], [place], [place] [address]   Procalcitonin     Status: None   Collection Time: 11/19/18  2:36 AM  Result Value Ref Range   Procalcitonin 14.95 ng/mL    Comment:        Interpretation: PCT >= 10 ng/mL: Important systemic inflammatory response, almost exclusively due to severe bacterial sepsis or septic shock. (NOTE)       Sepsis PCT Algorithm           Lower Respiratory Tract                                      Infection PCT Algorithm    ----------------------------     ----------------------------  PCT < 0.25 ng/mL                PCT < 0.10 ng/mL         Strongly encourage             Strongly discourage   discontinuation of antibiotics    initiation of antibiotics    ----------------------------     -----------------------------       PCT 0.25 - 0.50 ng/mL            PCT 0.10 - 0.25 ng/mL               OR       >80% decrease in PCT            Discourage initiation of                                            antibiotics      Encourage discontinuation           of antibiotics    ----------------------------     -----------------------------         PCT >= 0.50 ng/mL              PCT 0.26 - 0.50 ng/mL                AND       <80% decrease in PCT             Encourage initiation of                                             antibiotics       Encourage continuation           of antibiotics    ----------------------------     -----------------------------        PCT >= 0.50 ng/mL                  PCT > 0.50 ng/mL               AND         increase in PCT                  Strongly encourage                                      initiation of antibiotics    Strongly encourage escalation           of antibiotics                                     -----------------------------                                           PCT <= 0.25 ng/mL  OR                                        > 80% decrease in PCT                                     Discontinue / Do  not initiate                                             antibiotics Performed at [place] [address] [place], [place] [address]   Triglycerides     Status: None   Collection Time: 11/19/18  2:36 AM  Result Value Ref Range   Triglycerides 58 <150 mg/dL    Comment: Performed at [facility], [place] [address] [place], [place] [address]  Troponin I - Once     Status: Abnormal   Collection Time: 11/19/18  2:36 AM  Result Value Ref Range   Troponin I 0.10 (HH) <0.03 ng/mL    Comment: CRITICAL RESULT CALLED TO, READ BACK BY AND VERIFIED WITHKathaleen Bury RN 0867 11/19/18 A NAVARRO Performed at [facility], [place] [address] [place], [place] [address]    Dg Chest 2 View  Result Date: 11/17/2018 CLINICAL DATA:  Shortness of breath, fever, and bilateral rib pain. History of smoking and pancreatic cancer. EXAM: CHEST - 2 VIEW COMPARISON:  07/18/2018 FINDINGS: Left subclavian Port-A-Cath has been removed. The cardiomediastinal silhouette is within normal limits. The lungs are mildly hyperinflated with peribronchial thickening. No confluent airspace opacity, overt pulmonary edema, pleural effusion, pneumothorax is identified. No acute osseous abnormality is seen. IMPRESSION: Bronchitic changes. Electronically Signed   By: Logan Bores M.D.   On: 11/17/2018 13:24   Dg Chest Port 1 View  Result Date: 11/19/2018 CLINICAL DATA:  58 y/o  M; cough and fever. EXAM: PORTABLE CHEST 1 VIEW COMPARISON:  11/17/2018 chest radiograph. FINDINGS: Stable normal cardiac silhouette given projection and technique. Stable bronchitic changes in the lung bases. No new consolidation, effusion, or pneumothorax. Bones are unremarkable. IMPRESSION: Stable bronchitic changes in lung bases. No new consolidation. Electronically Signed   By: Kristine Garbe M.D.   On: 11/19/2018 00:26    Pending Labs Unresulted Labs (From admission, onward)    Start      Ordered   11/19/18 0500  Magnesium  Tomorrow morning,   R     11/19/18 0206   11/19/18 0500  Hemoglobin A1c  Tomorrow morning,   R     11/19/18 0441   11/19/18 0500  Lipid panel  Tomorrow morning,   R    Comments:  Please obtain as a fasting lipid panel - should not have eaten/ drank food for 8 hours prior to labs.    11/19/18 0441   11/19/18 0439  Troponin I - Now Then Q6H  Now then every 6 hours,   R     11/19/18 0438   11/19/18 0214  Legionella Pneumophila Serogp 1 Ur Ag  Once,   R     11/19/18 0213   11/19/18 0212  Culture, sputum-assessment  Once,   R     11/19/18 0213   11/19/18 9326  Gram stain  Once,   R     11/19/18 0213   11/19/18 0212  HIV antibody (Routine Screening)  Once,   R     11/19/18 0213   11/19/18 0212  Strep pneumoniae urinary antigen  Once,   R     11/19/18 0213   11/19/18 0209  Interleukin-6, Plasma  Once,   R     11/19/18 0208   11/19/18 0208  Respiratory Panel by PCR  Add-on,   R     11/19/18 0208   11/19/18 0208  Glucose 6 phosphate dehydrogenase  Once,   R     11/19/18 0208   11/19/18 0208  Hepatitis B surface antigen  Once,   R     11/19/18 0208   11/18/18 2242  Lactic acid, plasma  Now then every 2 hours,   STAT     11/18/18 2242   11/18/18 2242  Blood Culture (routine x 2)  BLOOD CULTURE X 2,   STAT     11/18/18 2242          Vitals/Pain Today's Vitals   11/19/18 0339 11/19/18 0345 11/19/18 0434 11/19/18 0442  BP:  136/84  104/74  Pulse: 89 97 84 83  Resp: 19 (!) 25 17 17   Temp:      TempSrc:      SpO2: 100% 98% 98% 98%  Weight:      Height:      PainSc:        Isolation Precautions Droplet and Contact precautions  Medications Medications  0.9 %  sodium chloride infusion ( Intravenous New Bag/Given 11/19/18 0205)  cefTRIAXone (ROCEPHIN) 1 g in sodium chloride 0.9 % 100 mL IVPB (has no administration in time range)  azithromycin (ZITHROMAX) 500 mg in sodium chloride 0.9 % 250 mL IVPB (has no administration in time range)   dextromethorphan-guaiFENesin (MUCINEX DM) 30-600 MG per 12 hr tablet 1 tablet (has no administration in time range)  nicotine (NICODERM CQ - dosed in mg/24 hours) patch 21 mg (has no administration in time range)  polyethylene glycol (MIRALAX / GLYCOLAX) packet 17 g (has no administration in time range)  ALPRAZolam (XANAX) tablet 0.25 mg (has no administration in time range)  diphenoxylate-atropine (LOMOTIL) 2.5-0.025 MG per tablet 2 tablet (has no administration in time range)  pantoprazole (PROTONIX) EC tablet 40 mg (has no administration in time range)  simethicone (MYLICON) chewable tablet 80 mg (has no administration in time range)  folic acid (FOLVITE) tablet 1 mg (has no administration in time range)  rivaroxaban (XARELTO) tablet 20 mg (has no administration in time range)  loratadine (CLARITIN) tablet 10 mg (has no administration in time range)  albuterol (PROVENTIL HFA;VENTOLIN HFA) 108 (90 Base) MCG/ACT inhaler 2 puff (has no administration in time range)  aspirin EC tablet 81 mg (has no administration in time range)  acetaminophen (TYLENOL) tablet 650 mg (650 mg Oral Given 11/18/18 2259)  sodium chloride 0.9 % bolus 1,000 mL (0 mLs Intravenous Stopped 11/19/18 0020)  cefTRIAXone (ROCEPHIN) 1 g in sodium chloride 0.9 % 100 mL IVPB (0 g Intravenous Stopped 11/18/18 2348)  azithromycin (ZITHROMAX) 500 mg in sodium chloride 0.9 % 250 mL IVPB (0 mg Intravenous Stopped 11/19/18 0043)  sodium chloride 0.9 % bolus 1,000 mL (0 mLs Intravenous Stopped 11/19/18 0444)  sodium chloride 0.9 % bolus 250 mL (0 mLs Intravenous Stopped 11/19/18 0444)  potassium chloride SA (K-DUR,KLOR-CON) CR tablet 20 mEq (20 mEq Oral Given 11/19/18 0225)  Mobility walks  

## 2018-11-19 NOTE — Progress Notes (Signed)
Dr. Darrick Meigs notified that respiratory panel and flu panel results were both negative. Order received to dc droplet precautions. Stacey Drain

## 2018-11-19 NOTE — Progress Notes (Signed)
[facility]  PROGRESS NOTE  Ernest Wu WYO:[id number] DOB: 1960/09/30 DOA: 11/18/2018 PCP: [name], [name], MD   Brief HPI:   58 year old male with a history of pancreatic cancer on chemotherapy, tobacco abuse, GERD, anxiety came with cough, shortness of breath fever and chills.  Patient's temp was 102.4.  Also was coughing up yellow-colored phlegm.  No contact with COVID-19 patients.    Subjective   Patient seen and examined, denies shortness of breath.  Not requiring oxygen.  COVID-19 test came back negative.  LDH 154.   Assessment/Plan:     1. Acute bronchitis-chest x-ray was negative for infiltrate.  COVID-19 test came back negative.  Patient started on IV azithromycin and Rocephin.  LDH 154, lactic acid 1.2, procalcitonin 14.95.  We will continue with antibiotics at this time.C-reactive protein 23.5, d-dimer elevated 3.82, (Patient is on Xarelto) strep pneumo urinary antigen negative, urine Legionella,  antigen is pending, blood cultures x2 are pending.  2. Adenocarcinoma of pancreas-diagnosed in November 2019, patient on chemotherapy.  Last dose was 10/16/2018.  Follow-up with Dr. Burr Medico as outpatient.    3. 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 Hypokalemia-potassium was 3.4, replaced.  Recheck BMP today.  5. Transaminitis-patient has elevated liver enzymes likely from pancreatic cancer, AST 52, ALT 102, alk phos 206.  Follow liver enzymes in a.m.      CBC: Recent Labs  Lab 11/18/18 2300  WBC 3.0*  NEUTROABS 2.8  HGB 11.9*  HCT 36.9*  MCV 97.9  PLT 774    Basic Metabolic Panel: Recent Labs  Lab 11/18/18 2300 11/19/18 0548  NA 133*  --   K 3.4*  --   CL 97*  --   CO2 25  --   GLUCOSE 136*  --   BUN 14  --   CREATININE 0.92  --   CALCIUM 8.3*  --   MG  --  2.0     DVT prophylaxis: Xarelto  Code Status: Full code  Family Communication: No family at  bedside  Disposition Plan: likely home when medically ready for discharge     Consultants:    Procedures:     Antibiotics:   Anti-infectives (From admission, onward)   Start     Dose/Rate Route Frequency Ordered Stop   11/19/18 2300  azithromycin (ZITHROMAX) 500 mg in sodium chloride 0.9 % 250 mL IVPB     500 mg 250 mL/hr over 60 Minutes Intravenous Every 24 hours 11/19/18 0204     11/19/18 2200  cefTRIAXone (ROCEPHIN) 1 g in sodium chloride 0.9 % 100 mL IVPB     1 g 200 mL/hr over 30 Minutes Intravenous Every 24 hours 11/19/18 0204     11/18/18 2300  cefTRIAXone (ROCEPHIN) 1 g in sodium chloride 0.9 % 100 mL IVPB     1 g 200 mL/hr over 30 Minutes Intravenous  Once 11/18/18 2252 11/18/18 2348   11/18/18 2300  azithromycin (ZITHROMAX) 500 mg in sodium chloride 0.9 % 250 mL IVPB     500 mg 250 mL/hr over 60 Minutes Intravenous  Once 11/18/18 2252 11/19/18 0043       Objective   Vitals:   11/19/18 0434 11/19/18 0442 11/19/18 0520 11/19/18 1251  BP:  104/74 108/83 126/86  Pulse: 84 83 87 88  Resp: 17 17 18 19   Temp:   97.6 F (36.4 C) 98.7 F (37.1 C)  TempSrc:   Oral Oral  SpO2: 98%  98% 97% 98%  Weight:   89.4 kg   Height:   6' (1.829 m)     Intake/Output Summary (Last 24 hours) at 11/19/2018 1643 Last data filed at 11/19/2018 1100 Gross per 24 hour  Intake 3609.92 ml  Output 3 ml  Net 3606.92 ml   Filed Weights   11/18/18 2235 11/19/18 0520  Weight: 90.7 kg 89.4 kg     Physical Examination:    General: Appears in no acute distress  Cardiovascular: S1-S2, regular, no murmur auscultated  Respiratory: Clear to auscultation bilaterally   Abdomen: Abdomen is soft, nontender, no organomegaly  Extremities: No edema of the lower extremities  Neurologic: Alert, oriented x3, no focal deficit noted     Data Reviewed: I have personally reviewed following labs and imaging studies   Recent Results (from the past 240 hour(s))  Blood Culture  (routine x 2)     Status: None (Preliminary result)   Collection Time: 11/18/18 10:48 PM  Result Value Ref Range Status   Specimen Description   Final    BLOOD LEFT ANTECUBITAL Performed at [facility], [place] [address], [place], [place] [address]    Special Requests   Final    BOTTLES DRAWN AEROBIC AND ANAEROBIC Blood Culture adequate volume Performed at [facility] [address] [place], [place] [address]    Culture   Final    Lonell Grandchild NEGATIVE RODS ANAEROBIC BOTTLE ONLY Organism ID to follow Performed at [facility], [place] [address] [place], [place] [address]    Report Status PENDING  Incomplete  SARS Coronavirus 2 [facility] order, Performed in [place] hospital lab)     Status: None   Collection Time: 11/18/18 11:12 PM  Result Value Ref Range Status   SARS Coronavirus 2 NEGATIVE NEGATIVE Final    Comment: (NOTE) If result is NEGATIVE SARS-CoV-2 target nucleic acids are NOT DETECTED. The SARS-CoV-2 RNA is generally detectable in upper and lower  respiratory specimens during the acute phase of infection. The lowest  concentration of SARS-CoV-2 viral copies this assay can detect is 250  copies / mL. A negative result does not preclude SARS-CoV-2 infection  and should not be used as the sole basis for treatment or other  patient management decisions.  A negative result may occur with  improper specimen collection / handling, submission of specimen other  than nasopharyngeal swab, presence of viral mutation(s) within the  areas targeted by this assay, and inadequate number of viral copies  (<250 copies / mL). A negative result must be combined with clinical  observations, patient history, and epidemiological information. If result is POSITIVE SARS-CoV-2 target nucleic acids are DETECTED. The SARS-CoV-2 RNA is generally detectable in upper and lower  respiratory specimens dur ing the acute phase of infection.  Positive  results are  indicative of active infection with SARS-CoV-2.  Clinical  correlation with patient history and other diagnostic information is  necessary to determine patient infection status.  Positive results do  not rule out bacterial infection or co-infection with other viruses. If result is PRESUMPTIVE POSTIVE SARS-CoV-2 nucleic acids MAY BE PRESENT.   A presumptive positive result was obtained on the submitted specimen  and confirmed on repeat testing.  While 2019 novel coronavirus  (SARS-CoV-2) nucleic acids may be present in the submitted sample  additional confirmatory testing may be necessary for epidemiological  and / or clinical management purposes  to differentiate between  SARS-CoV-2 and other Sarbecovirus currently known to infect  humans.  If clinically indicated additional testing with an alternate test  methodology [phone]) is advised. The SARS-CoV-2 RNA is generally  detectable in upper and lower respiratory sp ecimens during the acute  phase of infection. The expected result is Negative. Fact Sheet for Patients:  StrictlyIdeas.no Fact Sheet for Healthcare Providers: BankingDealers.co.za This test is not yet approved or cleared by the [place] FDA and has been authorized for detection and/or diagnosis of SARS-CoV-2 by FDA under an Emergency Use Authorization (EUA).  This EUA will remain in effect (meaning this test can be used) for the duration of the COVID-19 declaration under Section 564(b)(1) of the Act, 21 U.S.C. section 360bbb-3(b)(1), unless the authorization is terminated or revoked sooner. Performed at [facility], [place] [address] [place], [place] [address]   Respiratory Panel by PCR     Status: None   Collection Time: 11/19/18  2:36 AM  Result Value Ref Range Status   Adenovirus NOT DETECTED NOT DETECTED Final   Coronavirus 229E NOT DETECTED NOT DETECTED Final    Comment: (NOTE) The Coronavirus on the  Respiratory Panel, DOES NOT test for the novel  Coronavirus (2019 nCoV)    Coronavirus HKU1 NOT DETECTED NOT DETECTED Final   Coronavirus NL63 NOT DETECTED NOT DETECTED Final   Coronavirus OC43 NOT DETECTED NOT DETECTED Final   Metapneumovirus NOT DETECTED NOT DETECTED Final   Rhinovirus / Enterovirus NOT DETECTED NOT DETECTED Final   Influenza A NOT DETECTED NOT DETECTED Final   Influenza B NOT DETECTED NOT DETECTED Final   Parainfluenza Virus 1 NOT DETECTED NOT DETECTED Final   Parainfluenza Virus 2 NOT DETECTED NOT DETECTED Final   Parainfluenza Virus 3 NOT DETECTED NOT DETECTED Final   Parainfluenza Virus 4 NOT DETECTED NOT DETECTED Final   Respiratory Syncytial Virus NOT DETECTED NOT DETECTED Final   Bordetella pertussis NOT DETECTED NOT DETECTED Final   Chlamydophila pneumoniae NOT DETECTED NOT DETECTED Final   Mycoplasma pneumoniae NOT DETECTED NOT DETECTED Final    Comment: Performed at [facility], [place], [place], [place] [address]     Liver Function Tests: Recent Labs  Lab 11/18/18 2300  AST 52*  ALT 102*  ALKPHOS 206*  BILITOT 1.0  PROT 7.2  ALBUMIN 3.0*   No results for input(s): LIPASE, AMYLASE in the last 168 hours. No results for input(s): AMMONIA in the last 168 hours.  Cardiac Enzymes: Recent Labs  Lab 11/19/18 0236 11/19/18 0548 11/19/18 1048  TROPONINI 0.10* 0.06* 0.05*   BNP (last 3 results) Recent Labs    11/18/18 2248  BNP 106.2*    ProBNP (last 3 results) No results for input(s): PROBNP in the last 8760 hours.    Studies: Dg Chest Port 1 View  Result Date: 11/19/2018 CLINICAL DATA:  59 y/o  M; cough and fever. EXAM: PORTABLE CHEST 1 VIEW COMPARISON:  11/17/2018 chest radiograph. FINDINGS: Stable normal cardiac silhouette given projection and technique. Stable bronchitic changes in the lung bases. No new consolidation, effusion, or pneumothorax. Bones are unremarkable. IMPRESSION: Stable bronchitic changes in lung bases.  No new consolidation. Electronically Signed   By: Kristine Garbe M.D.   On: 11/19/2018 00:26    Scheduled Meds: . aspirin EC  81 mg Oral Daily  . folic acid  1 mg Oral QPC breakfast  . loratadine  10 mg Oral QPC breakfast  . nicotine  21 mg Transdermal Daily  . pantoprazole  40 mg Oral Daily  . rivaroxaban  20  mg Oral QPC breakfast    Admission status: Inpatient: Based on patients clinical presentation and evaluation of above clinical data, I have made determination that patient meets Inpatient criteria at this time.  Time spent: 20 min  [facility] Pager [phone]. If 7PM-7AM, please contact night-coverage at [email or web address], Office  [phone]  password [email or web address]  11/19/2018, 4:43 PM  LOS: 0 days

## 2018-11-19 NOTE — Progress Notes (Signed)
PHARMACY - PHYSICIAN COMMUNICATION CRITICAL VALUE ALERT - BLOOD CULTURE IDENTIFICATION (BCID)  Ernest Wu is an 58 y.o. male who presented to [facility] on 11/18/2018 with a chief complaint of cough, SOB, chills.   Assessment:  Pt currently on ceftriaxone and azithromycin for PNA. GNR 1/4, but no species detected on the BCID panel.  Name of physician (or Provider) Contacted: Dr. Darrick Meigs  Current antibiotics: Ceftriaxone 1 g IV daily + azithromycin 500 mg IV daily  Changes to prescribed antibiotics recommended: Increase ceftriaxone to 2 g IV daily  Results for orders placed or performed during the hospital encounter of 11/18/18  Blood Culture ID Panel (Reflexed) (Collected: 11/18/2018 10:48 PM)  Result Value Ref Range   Enterococcus species NOT DETECTED NOT DETECTED   Listeria monocytogenes NOT DETECTED NOT DETECTED   Staphylococcus species NOT DETECTED NOT DETECTED   Staphylococcus aureus (BCID) NOT DETECTED NOT DETECTED   Streptococcus species NOT DETECTED NOT DETECTED   Streptococcus agalactiae NOT DETECTED NOT DETECTED   Streptococcus pneumoniae NOT DETECTED NOT DETECTED   Streptococcus pyogenes NOT DETECTED NOT DETECTED   Acinetobacter baumannii NOT DETECTED NOT DETECTED   Enterobacteriaceae species NOT DETECTED NOT DETECTED   Enterobacter cloacae complex NOT DETECTED NOT DETECTED   Escherichia coli NOT DETECTED NOT DETECTED   Klebsiella oxytoca NOT DETECTED NOT DETECTED   Klebsiella pneumoniae NOT DETECTED NOT DETECTED   Proteus species NOT DETECTED NOT DETECTED   Serratia marcescens NOT DETECTED NOT DETECTED   Haemophilus influenzae NOT DETECTED NOT DETECTED   Neisseria meningitidis NOT DETECTED NOT DETECTED   Pseudomonas aeruginosa NOT DETECTED NOT DETECTED   Candida albicans NOT DETECTED NOT DETECTED   Candida glabrata NOT DETECTED NOT DETECTED   Candida krusei NOT DETECTED NOT DETECTED   Candida parapsilosis NOT DETECTED NOT DETECTED   Candida tropicalis NOT DETECTED  NOT DETECTED    Lenis Noon, PharmD 11/19/2018  5:21 PM

## 2018-11-19 NOTE — ED Notes (Signed)
Date and time results received: 11/19/18 0325 (use smartphrase ".now" to insert current time)  Test: Troponin Critical Value: 0.10  Name of Provider Notified: Ivor Costa, MD  Orders Received? Or Actions Taken?: Orders Received - See Orders for details

## 2018-11-19 NOTE — Progress Notes (Signed)
Patient result SAR COVID 2 , negative. Hospitalist Provider contacted. Order received to discontinue precautions.

## 2018-11-19 NOTE — ED Notes (Signed)
Date and time results received: 11/19/18 0018   Test: Lactic Acid Critical Value: 2.3  Name of Provider Notified:J Tomi Bamberger   Orders Received? Or Actions Taken?: monitor

## 2018-11-19 NOTE — H&P (Addendum)
History and Physical    Ernest Wu FAO:[id number] DOB: 1961-02-10 DOA: 11/18/2018  Referring MD/NP/PA:   PCP: Street, [name], MD   Patient coming from:  The patient is coming from home.  At baseline, pt is independent for most of ADL.        Chief Complaint: Cough, shortness of breath, fever, chills  HPI: Ernest Wu is a 58 y.o. male with medical history significant of pancreatic cancer on chemotherapy, tobacco abuse, GERD, anxiety, who presents with cough, shortness of breath, fever and chills.  Patient states that his symptoms has been going on for more than 4 days, including fever, chills, cough, shortness of breath.  He had temperature of 102.4.  She coughs up a little yellow-colored sputum.  Patient denies any chest pain, runny nose or sore throat.  Denies sick contact with pt with COVID-19.  Patient is constipated, but no nausea vomiting, diarrhea, abdominal pain, symptoms of UTI or unilateral weakness.  Patient states that he finished the last dose of chemotherapy on March 12.  Per EMS report, patient required non-breather oxygen supplement, but his oxygen saturation is 97% on room air in the ED.  ED Course: pt was found to have WBC 3.0, lymphopenia, lactic acid 2.3, negative urinalysis, potassium 3.4, abnormal liver function (ALP 206, AST 52, ALT 102, total bilirubin 1.0), temperature 100.8, soft blood pressure, tachycardia, tachypnea.  Pending COVID-19 test. renal function normal, chest x-ray showed stable bronchitis change.  ABG with pH 7.437, PCO2 35 PO2 80 on room air. Patient is admitted to stepdown as inpatient.  Review of Systems:   General: has fevers, chills, no body weight gain, has fatigue HEENT: no blurry vision, hearing changes or sore throat Respiratory: has dyspnea, coughing, no wheezing CV: no chest pain, no palpitations GI: no nausea, vomiting, abdominal pain, diarrhea, has constipation GU: no dysuria, burning on urination, increased urinary frequency,  hematuria  Ext: no leg edema Neuro: no unilateral weakness, numbness, or tingling, no vision change or hearing loss Skin: no rash, no skin tear. MSK: No muscle spasm, no deformity, no limitation of range of movement in spin Heme: No easy bruising.  Travel history: No recent long distant travel.  Allergy:  Allergies  Allergen Reactions  . Contrast Media [Iodinated Diagnostic Agents] Other (See Comments)    Burning sensation [SEE CONTRAST MEDIA]  . Iohexol Anaphylaxis, Itching and Rash    Past Medical History:  Diagnosis Date  . Anxiety   . Cancer ([place]) 06/2018   Pancreatic  . GERD (gastroesophageal reflux disease)     Past Surgical History:  Procedure Laterality Date  . BILIARY STENT PLACEMENT  06/10/2018   Procedure: BILIARY STENT PLACEMENT;  Surgeon: Ronnette Juniper, MD;  Location: [facility] ENDOSCOPY;  Service: Gastroenterology;;  . CHOLECYSTECTOMY N/A 11/10/2018   Procedure: Laparoscopic Cholecystectomy;  Surgeon: Stark Klein, MD;  Location: [place];  Service: General;  Laterality: N/A;  . DIAGNOSTIC LAPAROSCOPIC LIVER BIOPSY N/A 11/10/2018   Procedure: Diagnostic Laparoscopic Liver Biopsy;  Surgeon: Stark Klein, MD;  Location: [place];  Service: General;  Laterality: N/A;  . ERCP N/A 06/10/2018   Procedure: ENDOSCOPIC RETROGRADE CHOLANGIOPANCREATOGRAPHY (ERCP);  Surgeon: Ronnette Juniper, MD;  Location: [place];  Service: Gastroenterology;  Laterality: N/A;  . ESOPHAGOGASTRODUODENOSCOPY (EGD) WITH PROPOFOL N/A 06/10/2018   Procedure: ESOPHAGOGASTRODUODENOSCOPY (EGD) WITH PROPOFOL;  Surgeon: Ronnette Juniper, MD;  Location: [place];  Service: Gastroenterology;  Laterality: N/A;  . FINE NEEDLE ASPIRATION  06/10/2018   Procedure: FINE NEEDLE ASPIRATION (FNA) LINEAR;  Surgeon: Ronnette Juniper, MD;  Location: [place];  Service: Gastroenterology;;  . PORT-A-CATH REMOVAL N/A 11/10/2018   Procedure: REMOVAL PORT-A-CATH;  Surgeon: Stark Klein, MD;  Location: [place];  Service: General;  Laterality:  N/A;  . PORTACATH PLACEMENT N/A 07/02/2018   Procedure: INSERTION PORT-A-CATH;  Surgeon: Stark Klein, MD;  Location: [place];  Service: General;  Laterality: N/A;  . SPHINCTEROTOMY  06/10/2018   Procedure: SPHINCTEROTOMY;  Surgeon: Ronnette Juniper, MD;  Location: [facility] ENDOSCOPY;  Service: Gastroenterology;;  . UPPER ESOPHAGEAL ENDOSCOPIC ULTRASOUND (EUS) N/A 06/10/2018   Procedure: UPPER ESOPHAGEAL ENDOSCOPIC ULTRASOUND (EUS);  Surgeon: Ronnette Juniper, MD;  Location: [place];  Service: Gastroenterology;  Laterality: N/A;  . VASECTOMY      Social History:  reports that he has been smoking. He has a 7.50 pack-year smoking history. He has never used smokeless tobacco. He reports current alcohol use. He reports that he does not use drugs.  Family History:  Family History  Problem Relation Age of Onset  . Stroke Mother   . Hypertension Mother   . CAD Father        CABG     Prior to Admission medications   Medication Sig Start Date End Date Taking? Authorizing Provider  ALPRAZolam (XANAX) 0.25 MG tablet Take 1 tablet (0.25 mg total) by mouth at bedtime as needed for anxiety. 07/07/18   [name], MD  azithromycin (ZITHROMAX) 250 MG tablet Take 2 tablets (500 mg total) by mouth daily for 1 day, THEN 1 tablet (250 mg total) daily for 4 days. 11/17/18 11/22/18  Zigmund Gottron, NP  diphenoxylate-atropine (LOMOTIL) 2.5-0.025 MG tablet Take 2 tablets by mouth 4 (four) times daily as needed for diarrhea or loose stools. 06/27/18   Alla Feeling, NP  enoxaparin (LOVENOX) 120 MG/0.8ML injection Inject 0.8 mLs (120 mg total) into the skin daily. 11/03/18   [name], MD  esomeprazole (NEXIUM) 20 MG capsule Take 20 mg by mouth daily after breakfast.     [provider]  folic acid (FOLVITE) 1 MG tablet Take 1 mg by mouth daily after breakfast.  08/15/18   [provider]  ibuprofen (ADVIL,MOTRIN) 200 MG tablet Take 400 mg by mouth daily as needed for headache or moderate pain.     [provider]  loratadine (CLARITIN) 10 MG tablet Take 10 mg by mouth daily after breakfast.    [provider]  magic mouthwash w/lidocaine SOLN Take 5 mLs by mouth 4 (four) times daily as needed for mouth pain. 08/22/18   Tanner, Lyndon Code., PA-C  oxyCODONE (OXY IR/ROXICODONE) 5 MG immediate release tablet Take 1 tablet (5 mg total) by mouth every 6 (six) hours as needed for severe pain. 07/02/18   Stark Klein, MD  oxyCODONE (OXY IR/ROXICODONE) 5 MG immediate release tablet Take 1 tablet (5 mg total) by mouth every 6 (six) hours as needed for severe pain. 11/10/18   Stark Klein, MD  rivaroxaban (XARELTO) 20 MG TABS tablet Take 1 tablet (20 mg total) by mouth daily with supper. Patient taking differently: Take 20 mg by mouth daily after breakfast.  10/02/18   [name], MD  simethicone (MYLICON) 096 MG chewable tablet Chew 125 mg by mouth every 6 (six) hours as needed for flatulence.    [provider]  prochlorperazine (COMPAZINE) 10 MG tablet Take 1 tablet (10 mg total) by mouth every 6 (six) hours as needed (NAUSEA). 06/29/18 11/12/18  [name], MD    Physical Exam: Vitals:  11/19/18 0110 11/19/18 0125 11/19/18 0145 11/19/18 0230  BP:  96/71 90/68 101/69  Pulse: (!) 106 96 99 99  Resp: 19 19 (!) 23 (!) 22  Temp:      TempSrc:      SpO2: 96% 97% 97% 96%  Weight:      Height:       General: Not in acute distress HEENT:       Eyes: PERRL, EOMI, no scleral icterus.       ENT: No discharge from the ears and nose, no pharynx injection, no tonsillar enlargement.        Neck: No JVD, no bruit, no mass felt. Heme: No neck lymph node enlargement. Cardiac: S1/S2, RRR, No murmurs, No gallops or rubs. Respiratory: No rales, wheezing, rhonchi or rubs. GI: Soft, nondistended, nontender, no rebound pain, no organomegaly, BS present. GU: No hematuria Ext: No pitting leg edema bilaterally. 2+DP/PT pulse bilaterally. Musculoskeletal: No joint deformities, No joint redness or warmth,  no limitation of ROM in spin. Skin: No rashes.  Neuro: Alert, oriented X3, cranial nerves II-XII grossly intact, moves all extremities normally.  Psych: Patient is not psychotic, no suicidal or hemocidal ideation.  Labs on Admission: I have personally reviewed following labs and imaging studies  CBC: Recent Labs  Lab 11/18/18 2300  WBC 3.0*  NEUTROABS 2.8  HGB 11.9*  HCT 36.9*  MCV 97.9  PLT 665   Basic Metabolic Panel: Recent Labs  Lab 11/18/18 2300  NA 133*  K 3.4*  CL 97*  CO2 25  GLUCOSE 136*  BUN 14  CREATININE 0.92  CALCIUM 8.3*   GFR: Estimated Creatinine Clearance: 93.7 mL/min (by C-G formula based on SCr of 0.92 mg/dL). Liver Function Tests: Recent Labs  Lab 11/18/18 2300  AST 52*  ALT 102*  ALKPHOS 206*  BILITOT 1.0  PROT 7.2  ALBUMIN 3.0*   No results for input(s): LIPASE, AMYLASE in the last 168 hours. No results for input(s): AMMONIA in the last 168 hours. Coagulation Profile: No results for input(s): INR, PROTIME in the last 168 hours. Cardiac Enzymes: No results for input(s): CKTOTAL, CKMB, CKMBINDEX, TROPONINI in the last 168 hours. BNP (last 3 results) No results for input(s): PROBNP in the last 8760 hours. HbA1C: No results for input(s): HGBA1C in the last 72 hours. CBG: No results for input(s): GLUCAP in the last 168 hours. Lipid Profile: No results for input(s): CHOL, HDL, LDLCALC, TRIG, CHOLHDL, LDLDIRECT in the last 72 hours. Thyroid Function Tests: No results for input(s): TSH, T4TOTAL, FREET4, T3FREE, THYROIDAB in the last 72 hours. Anemia Panel: No results for input(s): VITAMINB12, FOLATE, FERRITIN, TIBC, IRON, RETICCTPCT in the last 72 hours. Urine analysis:    Component Value Date/Time   COLORURINE YELLOW 11/18/2018 2343   APPEARANCEUR CLEAR 11/18/2018 2343   LABSPEC 1.012 11/18/2018 2343   PHURINE 5.0 11/18/2018 2343   GLUCOSEU 50 (A) 11/18/2018 2343   HGBUR SMALL (A) 11/18/2018 2343   [place] 11/18/2018  [place] 11/18/2018 2343   PROTEINUR 30 (A) 11/18/2018 2343   NITRITE NEGATIVE 11/18/2018 2343   LEUKOCYTESUR NEGATIVE 11/18/2018 2343   Sepsis Labs: @LABRCNTIP (procalcitonin:4,lacticidven:4) )No results found for this or any previous visit (from the past 240 hour(s)).   Radiological Exams on Admission: Dg Chest 2 View  Result Date: 11/17/2018 CLINICAL DATA:  Shortness of breath, fever, and bilateral rib pain. History of smoking and pancreatic cancer. EXAM: CHEST - 2 VIEW COMPARISON:  07/18/2018 FINDINGS: Left subclavian Port-A-Cath has  been removed. The cardiomediastinal silhouette is within normal limits. The lungs are mildly hyperinflated with peribronchial thickening. No confluent airspace opacity, overt pulmonary edema, pleural effusion, pneumothorax is identified. No acute osseous abnormality is seen. IMPRESSION: Bronchitic changes. Electronically Signed   By: Logan Bores M.D.   On: 11/17/2018 13:24   Dg Chest Port 1 View  Result Date: 11/19/2018 CLINICAL DATA:  58 y/o  M; cough and fever. EXAM: PORTABLE CHEST 1 VIEW COMPARISON:  11/17/2018 chest radiograph. FINDINGS: Stable normal cardiac silhouette given projection and technique. Stable bronchitic changes in the lung bases. No new consolidation, effusion, or pneumothorax. Bones are unremarkable. IMPRESSION: Stable bronchitic changes in lung bases. No new consolidation. Electronically Signed   By: Kristine Garbe M.D.   On: 11/19/2018 00:26     EKG: Independently reviewed.  Sinus rhythm, QTC 439, tachycardia, LAE, LAD.   Assessment/Plan Principal Problem:   Acute bronchitis Active Problems:   Tobacco use   Adenocarcinoma of pancreas (HCC)   Sepsis (HCC)   Hypokalemia   Suspected Covid-19 Virus Infection   Abnormal LFTs  Addendum: as part of the [place] work up, trop comes back positive 0.10. No chest pain. It is most likely due to demand ischemia 2/2 sepsis.  -will continue to trend trop  -check  A1c and FLP -repeat EKG in AM -start ASA 81 mg daily   Sepsis due to possible acute bronchitis: pt has fever, chills, cough, shortness of breath, but no chest pain.  Chest X did not show infiltration.  It is possible that the patient has acute bronchitis versus early stage of pneumonia. Since has abnormal liver function, lymphopenia, will need to r/o COVID19.  Patient meets criteria for sepsis with WBC<4.0, fever, tachycardia and tachypnea.  Blood pressure is soft. Patient is at risk of developing hypotension.  - will admit to SDU as inpt-->Bp is stabilized and COVID19 test is negative, will change bed to tele  - IV Rocephin and azithromycin - Mucinex for cough  - Atrovent inhaler and xopenex inhaler prn for SOB - Urine legionella and S. pneumococcal antigen - Follow up blood culture x2, sputum culture and respiratory virus panel, plus Flu pcr - COVID19 test was sent out by EDP - will get Procalcitonin and trend lactic acid level per sepsis protocol - IVF: pt received 2L NS in ED, but Bp still soft at SBP 90. Will give 250 cc NS and then 75 cc/h  Tobacco use: -nicotine patch  Adenocarcinoma of pancreas ([place]): Diagnosed 06/2018.  Patient was on chemotherapy, last dose was on 10/16/2018.  Follow-up with Dr. Burr Medico. -pt is on Xarelto -f/u with Dr. Burr Medico.  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 - Repleted - Check Mg level  Constipation: PRN MiraLAX  Anxiety: -Continue home Xanax.  Abnormal liver function: ALP 206, AST 52, ALT 102, total bilirubin 1.0. likely due to multifactorial etiology, including sepsis, pancreatic cancer, and part of presentation if patient has COVID-19. -Hepatitis B surface antigen is ordered as part of the COVID-19 work-up -Ideally we should avoid Tylenol for fever control, but the patient is suspected to have COVID-19. Pt is not a good candidate for ibuprofen.  Will use Tylenol  peripherally now.  Suspected Covid-19 Virus Infection: pt has respiratory symptoms and abnormal liver function, and lymphopenia, suspect COVID-19.  Physician PPE: used Capr, gown, glove Patient PPE: mask  Fever: yes Cough: yes  SOB: yes URI symptoms: yes GI symptoms: no  Travel: no Sick contacts: no CBC: leukopenia, lymphopenia-->yes BMP: increased BUN/Cr-->no LFTs: increased AST/ALT/Tbili-->yes CRP, LDH: increased-->pending Procalcitonin:-->pending CXR: hazy bilateral peripheral opacities-->no CT chest: GGO, consolidation, crazy paving-->not done COVID subjective risk assessment: low (pt is on RA) COVID Testing: indicated per current ID/[place] guidelines -->yes Precaution: Droplet and contact -will check D-dimer, BNP,Trop, LFT, CRP, LDH, Procalcitonin, IL-6, ferritin, fibinogen, TG, Hep B SAg, HIV ab, G6PD -pt is on Xarelto at home  Inpatient status:  # Patient requires inpatient status due to high intensity of service, high risk for further deterioration and high frequency of surveillance required.  I certify that at the point of admission it is my clinical judgment that the patient will require inpatient hospital care spanning beyond 2 midnights from the point of admission.  . This patient has multiple chronic comorbidities including pancreatic cancer on chemotherapy, tobacco abuse, GERD, anxiety . Now patient has presenting with sepsis due to possible bronchitis.  Also need to rule out COVID-19. Marland Kitchen The worrisome physical exam findings include fever and chills, soft Bp . The initial radiographic and laboratory data are worrisome because of lymphopenia, WBC 3.0, sepsis with elevated lactic acid . Current medical needs: please see my assessment and plan . Predictability of an adverse outcome (risk): Patient has multiple abilities, particularly pancreatic cancer on chemotherapy.  Patient is immunosuppressed.  Now presents with sepsis due to possible bronchitis.  COVID-19 need  to be ruled out.  Patient is at high risk for deteriorating due to immunosuppressed status.  Will need to be treated in hospital for at least 2 days     DVT ppx: on Xarelto Code Status: Full code Family Communication: None at bed side.       Disposition Plan:  Anticipate discharge back to previous home environment Consults called:  none Admission status: SDU/inpation       Date of Service 11/19/2018    [facility]   If 7PM-7AM, please contact night-coverage [email or web address] Password [email or web address] 11/19/2018, 2:50 AM

## 2018-11-20 ENCOUNTER — Inpatient Hospital Stay (HOSPITAL_COMMUNITY): Payer: Managed Care, Other (non HMO)

## 2018-11-20 DIAGNOSIS — A4150 Gram-negative sepsis, unspecified: Secondary | ICD-10-CM

## 2018-11-20 DIAGNOSIS — Z9049 Acquired absence of other specified parts of digestive tract: Secondary | ICD-10-CM

## 2018-11-20 DIAGNOSIS — R1011 Right upper quadrant pain: Secondary | ICD-10-CM

## 2018-11-20 DIAGNOSIS — J208 Acute bronchitis due to other specified organisms: Secondary | ICD-10-CM

## 2018-11-20 LAB — HEPATITIS B SURFACE ANTIGEN: Hepatitis B Surface Ag: NEGATIVE

## 2018-11-20 LAB — COMPREHENSIVE METABOLIC PANEL
ALT: 85 U/L — ABNORMAL HIGH (ref 0–44)
AST: 53 U/L — ABNORMAL HIGH (ref 15–41)
Albumin: 2.8 g/dL — ABNORMAL LOW (ref 3.5–5.0)
Alkaline Phosphatase: 157 U/L — ABNORMAL HIGH (ref 38–126)
Anion gap: 8 (ref 5–15)
BUN: 9 mg/dL (ref 6–20)
CO2: 22 mmol/L (ref 22–32)
Calcium: 8.2 mg/dL — ABNORMAL LOW (ref 8.9–10.3)
Chloride: 105 mmol/L (ref 98–111)
Creatinine, Ser: 0.76 mg/dL (ref 0.61–1.24)
GFR calc Af Amer: >60 mL/min (ref >60)
GFR calc non Af Amer: >60 mL/min (ref >60)
Glucose, Bld: 109 mg/dL — ABNORMAL HIGH (ref 70–99)
Potassium: 3.3 mmol/L — ABNORMAL LOW (ref 3.5–5.1)
Sodium: 135 mmol/L (ref 135–145)
Total Bilirubin: 0.7 mg/dL (ref 0.3–1.2)
Total Protein: 6.5 g/dL (ref 6.5–8.1)

## 2018-11-20 LAB — GLUCOSE 6 PHOSPHATE DEHYDROGENASE
G6PDH: 10.5 U/g{Hb} (ref 4.6–13.5)
Hemoglobin: 10.5 g/dL — ABNORMAL LOW (ref 13.0–17.7)

## 2018-11-20 LAB — CBC
HCT: 32.6 % — ABNORMAL LOW (ref 39.0–52.0)
Hemoglobin: 10.5 g/dL — ABNORMAL LOW (ref 13.0–17.0)
MCH: 31.8 pg (ref 26.0–34.0)
MCHC: 32.2 g/dL (ref 30.0–36.0)
MCV: 98.8 fL (ref 80.0–100.0)
Platelets: 158 10*3/uL (ref 150–400)
RBC: 3.3 MIL/uL — ABNORMAL LOW (ref 4.22–5.81)
RDW: 14.6 % (ref 11.5–15.5)
WBC: 8.5 10*3/uL (ref 4.0–10.5)
nRBC: 0 % (ref 0.0–0.2)

## 2018-11-20 LAB — LEGIONELLA PNEUMOPHILA SEROGP 1 UR AG: L. pneumophila Serogp 1 Ur Ag: NEGATIVE

## 2018-11-20 LAB — EXPECTORATED SPUTUM ASSESSMENT W GRAM STAIN, RFLX TO RESP C

## 2018-11-20 MED ORDER — METRONIDAZOLE IN NACL 5-0.79 MG/ML-% IV SOLN
500.0000 mg | Freq: Three times a day (TID) | INTRAVENOUS | Status: DC
  Administered 2018-11-20 – 2018-11-22 (×6): 500 mg via INTRAVENOUS
  Filled 2018-11-20 (×6): qty 100

## 2018-11-20 MED ORDER — POTASSIUM CHLORIDE CRYS ER 20 MEQ PO TBCR
40.0000 meq | EXTENDED_RELEASE_TABLET | Freq: Once | ORAL | Status: AC
  Administered 2018-11-20: 40 meq via ORAL
  Filled 2018-11-20: qty 2

## 2018-11-20 MED ORDER — TECHNETIUM TC 99M MEBROFENIN IV KIT
5.3400 | PACK | Freq: Once | INTRAVENOUS | Status: AC
  Administered 2018-11-20: 5.34 via INTRAVENOUS

## 2018-11-20 MED ORDER — ACETAMINOPHEN 325 MG PO TABS
650.0000 mg | ORAL_TABLET | Freq: Four times a day (QID) | ORAL | Status: DC | PRN
  Administered 2018-11-20 – 2018-11-24 (×5): 650 mg via ORAL
  Filled 2018-11-20 (×5): qty 2

## 2018-11-20 MED ORDER — RIVAROXABAN 20 MG PO TABS: 20.0000 mg | ORAL_TABLET | Freq: Every day | ORAL | Status: DC

## 2018-11-20 NOTE — Progress Notes (Signed)
Received call from radiology, Ultrasound of abdomen shows possible biloma or abscess in gallbladder fossa, recommends CT scan, Dr. Darrick Meigs notified via [facility]

## 2018-11-20 NOTE — Progress Notes (Signed)
Ernest Wu  PROGRESS NOTE  ERIVERTO BYRNES POE:[id number] DOB: September 14, 1960 DOA: 11/18/2018 PCP: [name], [name], MD   Brief HPI:   58 year old male with a history of pancreatic cancer on chemotherapy, tobacco abuse, GERD, anxiety came with cough, shortness of breath fever and chills.  Patient's temp was 102.4.  Also was coughing up yellow-colored phlegm.  No contact with COVID-19 patients.    Subjective   Patient seen and examined, denies chest pain or shortness of breath.  COVID-19 test came back negative.  1 out of 2 anaerobic bottles growing gram-negative rods.  Patient is currently on ceftriaxone 2 g IV daily.  Patient had gallbladder removed 2 weeks ago.   Assessment/Plan:     1. Acute bronchitis-improved, chest x-ray was negative for infiltrate.  COVID-19 test came back negative.  Patient started on IV azithromycin and Rocephin.  LDH 154, lactic acid 1.2, procalcitonin 14.95.  We will continue with antibiotics at this time.C-reactive protein 23.5, d-dimer elevated 3.82, (Patient is on Xarelto) strep pneumo urinary antigen negative, urine Legionella,  antigen is pending, blood cultures x2 are pending.  2. Gram-negative rod bacteremia-?  Source, patient does have tenderness in right upper quadrant had laparoscopic cholecystectomy 10 days ago.  Will obtain abdominal ultrasound.  Continue IV ceftriaxone.  Will discontinue Zithromax and add Flagyl.  3. Adenocarcinoma of pancreas-diagnosed in November 2019, patient on chemotherapy.  Last dose was 10/16/2018.  Follow-up with Dr. Burr Medico as outpatient.    4. 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 Hypokalemia-potassium is 3.3, will replace potassium and check BMP in a.m.  6. Transaminitis-patient has elevated liver enzymes likely from pancreatic cancer, AST 52, ALT 102, alk phos 206.  Follow liver enzymes in a.m.      CBC: Recent Labs  Lab 11/18/18 2300  11/20/18 0910  WBC 3.0* 8.5  NEUTROABS 2.8  --   HGB 11.9* 10.5*  HCT 36.9* 32.6*  MCV 97.9 98.8  PLT 158 315    Basic Metabolic Panel: Recent Labs  Lab 11/18/18 2300 11/19/18 0548 11/19/18 1723 11/20/18 0910  NA 133*  --  136 135  K 3.4*  --  3.3* 3.3*  CL 97*  --  107 105  CO2 25  --  21* 22  GLUCOSE 136*  --  115* 109*  BUN 14  --  10 9  CREATININE 0.92  --  0.71 0.76  CALCIUM 8.3*  --  7.6* 8.2*  MG  --  2.0  --   --      DVT prophylaxis: Xarelto  Code Status: Full code  Family Communication: No family at bedside  Disposition Plan: likely home when medically ready for discharge     Consultants:    Procedures:     Antibiotics:   Anti-infectives (From admission, onward)   Start     Dose/Rate Route Frequency Ordered Stop   11/20/18 1400  metroNIDAZOLE (FLAGYL) IVPB 500 mg     500 mg 100 mL/hr over 60 Minutes Intravenous Every 8 hours 11/20/18 1107     11/19/18 2300  azithromycin (ZITHROMAX) 500 mg in sodium chloride 0.9 % 250 mL IVPB  Status:  Discontinued     500 mg 250 mL/hr over 60 Minutes Intravenous Every 24 hours 11/19/18 0204 11/20/18 1107   11/19/18 2200  cefTRIAXone (ROCEPHIN) 1 g in sodium chloride 0.9 % 100 mL IVPB  Status:  Discontinued     1 g 200 mL/hr over 30  Minutes Intravenous Every 24 hours 11/19/18 0204 11/19/18 1719   11/19/18 1800  cefTRIAXone (ROCEPHIN) 2 g in sodium chloride 0.9 % 100 mL IVPB     2 g 200 mL/hr over 30 Minutes Intravenous Every 24 hours 11/19/18 1719     11/18/18 2300  cefTRIAXone (ROCEPHIN) 1 g in sodium chloride 0.9 % 100 mL IVPB     1 g 200 mL/hr over 30 Minutes Intravenous  Once 11/18/18 2252 11/18/18 2348   11/18/18 2300  azithromycin (ZITHROMAX) 500 mg in sodium chloride 0.9 % 250 mL IVPB     500 mg 250 mL/hr over 60 Minutes Intravenous  Once 11/18/18 2252 11/19/18 0043       Objective   Vitals:   11/19/18 0520 11/19/18 1251 11/19/18 2055 11/20/18 0425  BP: 108/83 126/86 116/75 109/80  Pulse:  87 88 88 85  Resp: 18 19 18 19   Temp: 97.6 F (36.4 C) 98.7 F (37.1 C) 99.2 F (37.3 C) 99.5 F (37.5 C)  TempSrc: Oral Oral Oral Oral  SpO2: 97% 98% 96% 97%  Weight: 89.4 kg     Height: 6' (1.829 m)       Intake/Output Summary (Last 24 hours) at 11/20/2018 1108 Last data filed at 11/20/2018 0426 Gross per 24 hour  Intake 720 ml  Output -  Net 720 ml   Filed Weights   11/18/18 2235 11/19/18 0520  Weight: 90.7 kg 89.4 kg     Physical Examination:   General: Appears in no acute distress  Cardiovascular: S1-S2, regular, no murmur auscultated  Respiratory: Clear to auscultation bilaterally  Abdomen: Abdomen is soft, nontender, no organomegaly  Extremities: No edema in the lower extremities  Neurologic: Alert, oriented x3, no focal deficit noted.     Data Reviewed: I have personally reviewed following labs and imaging studies   Recent Results (from the past 240 hour(s))  Blood Culture (routine x 2)     Status: None (Preliminary result)   Collection Time: 11/18/18 10:48 PM  Result Value Ref Range Status   Specimen Description   Final    BLOOD LEFT ANTECUBITAL Performed at [facility] [address], [place], [place] [address]    Special Requests   Final    BOTTLES DRAWN AEROBIC AND ANAEROBIC Blood Culture adequate volume Performed at [facility] [address] [place], [place] [address]    Culture  Setup Time   Final    GRAM NEGATIVE RODS ANAEROBIC BOTTLE ONLY CRITICAL RESULT CALLED TO, READ BACK BY AND VERIFIED WITH: Shelda Jakes PharmD 17:10 11/19/18 ([email or web address])    Culture   Final    Lonell Grandchild NEGATIVE RODS IDENTIFICATION AND SUSCEPTIBILITIES TO FOLLOW Performed at [facility], [place] [address], [place], [place] [address]    Report Status PENDING  Incomplete  Blood Culture ID Panel (Reflexed)     Status: None   Collection Time: 11/18/18 10:48 PM  Result Value Ref Range Status   Enterococcus species NOT DETECTED  NOT DETECTED Final   Listeria monocytogenes NOT DETECTED NOT DETECTED Final   Staphylococcus species NOT DETECTED NOT DETECTED Final   Staphylococcus aureus (BCID) NOT DETECTED NOT DETECTED Final   Streptococcus species NOT DETECTED NOT DETECTED Final   Streptococcus agalactiae NOT DETECTED NOT DETECTED Final   Streptococcus pneumoniae NOT DETECTED NOT DETECTED Final   Streptococcus pyogenes NOT DETECTED NOT DETECTED Final   Acinetobacter baumannii NOT DETECTED NOT DETECTED Final   Enterobacteriaceae species NOT DETECTED NOT DETECTED Final  Enterobacter cloacae complex NOT DETECTED NOT DETECTED Final   Escherichia coli NOT DETECTED NOT DETECTED Final   Klebsiella oxytoca NOT DETECTED NOT DETECTED Final   Klebsiella pneumoniae NOT DETECTED NOT DETECTED Final   Proteus species NOT DETECTED NOT DETECTED Final   Serratia marcescens NOT DETECTED NOT DETECTED Final   Haemophilus influenzae NOT DETECTED NOT DETECTED Final   Neisseria meningitidis NOT DETECTED NOT DETECTED Final   Pseudomonas aeruginosa NOT DETECTED NOT DETECTED Final   Candida albicans NOT DETECTED NOT DETECTED Final   Candida glabrata NOT DETECTED NOT DETECTED Final   Candida krusei NOT DETECTED NOT DETECTED Final   Candida parapsilosis NOT DETECTED NOT DETECTED Final   Candida tropicalis NOT DETECTED NOT DETECTED Final    Comment: Performed at [facility], [place] [address], [place], [place] [address]  SARS Coronavirus 2 [facility] order, Performed in [facility] hospital lab)     Status: None   Collection Time: 11/18/18 11:12 PM  Result Value Ref Range Status   SARS Coronavirus 2 NEGATIVE NEGATIVE Final    Comment: (NOTE) If result is NEGATIVE SARS-CoV-2 target nucleic acids are NOT DETECTED. The SARS-CoV-2 RNA is generally detectable in upper and lower  respiratory specimens during the acute phase of infection. The lowest  concentration of SARS-CoV-2 viral copies this assay can detect is 250  copies / mL. A  negative result does not preclude SARS-CoV-2 infection  and should not be used as the sole basis for treatment or other  patient management decisions.  A negative result may occur with  improper specimen collection / handling, submission of specimen other  than nasopharyngeal swab, presence of viral mutation(s) within the  areas targeted by this assay, and inadequate number of viral copies  (<250 copies / mL). A negative result must be combined with clinical  observations, patient history, and epidemiological information. If result is POSITIVE SARS-CoV-2 target nucleic acids are DETECTED. The SARS-CoV-2 RNA is generally detectable in upper and lower  respiratory specimens dur ing the acute phase of infection.  Positive  results are indicative of active infection with SARS-CoV-2.  Clinical  correlation with patient history and other diagnostic information is  necessary to determine patient infection status.  Positive results do  not rule out bacterial infection or co-infection with other viruses. If result is PRESUMPTIVE POSTIVE SARS-CoV-2 nucleic acids MAY BE PRESENT.   A presumptive positive result was obtained on the submitted specimen  and confirmed on repeat testing.  While 2019 novel coronavirus  (SARS-CoV-2) nucleic acids may be present in the submitted sample  additional confirmatory testing may be necessary for epidemiological  and / or clinical management purposes  to differentiate between  SARS-CoV-2 and other Sarbecovirus currently known to infect humans.  If clinically indicated additional testing with an alternate test  methodology [phone]) is advised. The SARS-CoV-2 RNA is generally  detectable in upper and lower respiratory sp ecimens during the acute  phase of infection. The expected result is Negative. Fact Sheet for Patients:  StrictlyIdeas.no Fact Sheet for Healthcare Providers: BankingDealers.co.za This test is not  yet approved or cleared by the [place] FDA and has been authorized for detection and/or diagnosis of SARS-CoV-2 by FDA under an Emergency Use Authorization (EUA).  This EUA will remain in effect (meaning this test can be used) for the duration of the COVID-19 declaration under Section 564(b)(1) of the Act, 21 U.S.C. section 360bbb-3(b)(1), unless the authorization is terminated or revoked sooner. Performed at [facility], [address] [place], [place] [address]   Respiratory Panel by PCR     Status: None   Collection Time: 11/19/18  2:36 AM  Result Value Ref Range Status   Adenovirus NOT DETECTED NOT DETECTED Final   Coronavirus 229E NOT DETECTED NOT DETECTED Final    Comment: (NOTE) The Coronavirus on the Respiratory Panel, DOES NOT test for the novel  Coronavirus (2019 nCoV)    Coronavirus HKU1 NOT DETECTED NOT DETECTED Final   Coronavirus NL63 NOT DETECTED NOT DETECTED Final   Coronavirus OC43 NOT DETECTED NOT DETECTED Final   Metapneumovirus NOT DETECTED NOT DETECTED Final   Rhinovirus / Enterovirus NOT DETECTED NOT DETECTED Final   Influenza A NOT DETECTED NOT DETECTED Final   Influenza B NOT DETECTED NOT DETECTED Final   Parainfluenza Virus 1 NOT DETECTED NOT DETECTED Final   Parainfluenza Virus 2 NOT DETECTED NOT DETECTED Final   Parainfluenza Virus 3 NOT DETECTED NOT DETECTED Final   Parainfluenza Virus 4 NOT DETECTED NOT DETECTED Final   Respiratory Syncytial Virus NOT DETECTED NOT DETECTED Final   Bordetella pertussis NOT DETECTED NOT DETECTED Final   Chlamydophila pneumoniae NOT DETECTED NOT DETECTED Final   Mycoplasma pneumoniae NOT DETECTED NOT DETECTED Final    Comment: Performed at [facility], [place]. [address], [place], [place] [address]     Liver Function Tests: Recent Labs  Lab 11/18/18 2300 11/19/18 1723 11/20/18 0910  AST 52* 46* 53*  ALT 102* 84* 85*  ALKPHOS 206* 157* 157*  BILITOT 1.0 0.2* 0.7  PROT 7.2 6.3* 6.5  ALBUMIN 3.0*  2.6* 2.8*   No results for input(s): LIPASE, AMYLASE in the last 168 hours. No results for input(s): AMMONIA in the last 168 hours.  Cardiac Enzymes: Recent Labs  Lab 11/19/18 0236 11/19/18 0548 11/19/18 1048 11/19/18 1723  TROPONINI 0.10* 0.06* 0.05* 0.05*   BNP (last 3 results) Recent Labs    11/18/18 2248  BNP 106.2*    ProBNP (last 3 results) No results for input(s): PROBNP in the last 8760 hours.    Studies: Dg Chest Port 1 View  Result Date: 11/19/2018 CLINICAL DATA:  58 y/o  M; cough and fever. EXAM: PORTABLE CHEST 1 VIEW COMPARISON:  11/17/2018 chest radiograph. FINDINGS: Stable normal cardiac silhouette given projection and technique. Stable bronchitic changes in the lung bases. No new consolidation, effusion, or pneumothorax. Bones are unremarkable. IMPRESSION: Stable bronchitic changes in lung bases. No new consolidation. Electronically Signed   By: Kristine Garbe M.D.   On: 11/19/2018 00:26    Scheduled Meds: . aspirin EC  81 mg Oral Daily  . folic acid  1 mg Oral QPC breakfast  . loratadine  10 mg Oral QPC breakfast  . nicotine  21 mg Transdermal Daily  . pantoprazole  40 mg Oral Daily  . rivaroxaban  20 mg Oral QPC breakfast    Admission status: Inpatient: Based on patients clinical presentation and evaluation of above clinical data, I have made determination that patient meets Inpatient criteria at this time.  Time spent: 20 min  [facility] Pager [phone]. If 7PM-7AM, please contact night-coverage at [email or web address], Office  [phone]  password [email or web address]  11/20/2018, 11:08 AM  LOS: 1 day

## 2018-11-20 NOTE — Progress Notes (Signed)
Request to IR for aspiration/drainage of possible gallbladder fossa abscess/biloma seen on RUQ [name] today.  Patient reviewed with Dr. Laurence Ferrari who recommends CT abdomen with contrast for further evaluation -- upon chart review patient has contrast allergy listed as anaphylaxis. I will order a CT abdomen WITHOUT contrast today.  I will also hold his Xarelto and place orders for NPO after midnight and AM labs in anticipation of possible procedure tomorrow pending review of CT abdomen by IR MD.   Please call with questions or concerns.  Candiss Norse, PA-C Pager# [phone]

## 2018-11-20 NOTE — Progress Notes (Addendum)
Ernest Wu   DOB:10/12/1960   TK#:[id number]   XNT#:[id number]  Oncology follow up   Subjective: Patient's blood culture showed GNR (+), he underwent abdominal ultrasound, CT scan, and HIDA scan this afternoon.  He remains to be afebrile today, has mild pain in RUQ abdomen, no other new complains.    Objective:  Vitals:   11/20/18 1310 11/20/18 2055  BP: 114/82 121/75  Pulse: 68 93  Resp: (!) 22 20  Temp: 97.8 F (36.6 C) 99 F (37.2 C)  SpO2: 99% 96%    Body mass index is 26.73 kg/m.  Intake/Output Summary (Last 24 hours) at 11/20/2018 2215 Last data filed at 11/20/2018 1905 Gross per 24 hour  Intake 1202.67 ml  Output -  Net 1202.67 ml     Sclerae unicteric  Oropharynx clear  No peripheral adenopathy  Lungs clear -- no rales or rhonchi  Heart regular rate and rhythm  Abdomen benign, soft, laparoscopic incisions have healed well, mild tenderness and scar tissue in the midline small incision. Mild RUQ tenderness   MSK no focal spinal tenderness, no peripheral edema  Neuro nonfocal    CBG (last 3)  No results for input(s): GLUCAP in the last 72 hours.  Urine Studies No results for input(s): UHGB, CRYS in the last 72 hours.  Invalid input(s): UACOL, UAPR, USPG, UPH, UTP, UGL, UKET, UBIL, UNIT, UROB, ULEU, UEPI, UWBC, URBC, UBAC, CAST, [place], [place]  Basic Metabolic Panel: Recent Labs  Lab 11/18/18 2300 11/19/18 0548 11/19/18 1723 11/20/18 0910  NA 133*  --  136 135  K 3.4*  --  3.3* 3.3*  CL 97*  --  107 105  CO2 25  --  21* 22  GLUCOSE 136*  --  115* 109*  BUN 14  --  10 9  CREATININE 0.92  --  0.71 0.76  CALCIUM 8.3*  --  7.6* 8.2*  MG  --  2.0  --   --    GFR Estimated Creatinine Clearance: 107.8 mL/min (by C-G formula based on SCr of 0.76 mg/dL). Liver Function Tests: Recent Labs  Lab 11/18/18 2300 11/19/18 1723 11/20/18 0910  AST 52* 46* 53*  ALT 102* 84* 85*  ALKPHOS 206* 157* 157*  BILITOT 1.0 0.2* 0.7  PROT 7.2 6.3* 6.5  ALBUMIN 3.0* 2.6*  2.8*   No results for input(s): LIPASE, AMYLASE in the last 168 hours. No results for input(s): AMMONIA in the last 168 hours. Coagulation profile No results for input(s): INR, PROTIME in the last 168 hours.  CBC: Recent Labs  Lab 11/18/18 2300 11/19/18 0236 11/20/18 0910  WBC 3.0*  --  8.5  NEUTROABS 2.8  --   --   HGB 11.9* 10.5* 10.5*  HCT 36.9*  --  32.6*  MCV 97.9  --  98.8  PLT 158  --  158   Cardiac Enzymes: Recent Labs  Lab 11/19/18 0236 11/19/18 0548 11/19/18 1048 11/19/18 1723  TROPONINI 0.10* 0.06* 0.05* 0.05*   BNP: Invalid input(s): POCBNP CBG: No results for input(s): GLUCAP in the last 168 hours. D-Dimer Recent Labs    11/19/18 0236  DDIMER 3.82*   Hgb A1c Recent Labs    11/19/18 0548  HGBA1C 5.4   Lipid Profile Recent Labs    11/19/18 0236 11/19/18 0548  CHOL  --  110  HDL  --  22*  LDLCALC  --  71  TRIG 58 85  CHOLHDL  --  5.0   Thyroid function studies No results  for input(s): TSH, T4TOTAL, T3FREE, THYROIDAB in the last 72 hours.  Invalid input(s): FREET3 Anemia work up [employer]    11/19/18 0236  FERRITIN 1,105*   Microbiology Recent Results (from the past 240 hour(s))  Blood Culture (routine x 2)     Status: None (Preliminary result)   Collection Time: 11/18/18 10:48 PM  Result Value Ref Range Status   Specimen Description   Final    BLOOD LEFT ANTECUBITAL Performed at [facility] [address] [place], [place] [address]    Special Requests   Final    BOTTLES DRAWN AEROBIC AND ANAEROBIC Blood Culture adequate volume Performed at [facility] [address] [place], [place] [address]    Culture  Setup Time   Final    GRAM NEGATIVE RODS ANAEROBIC BOTTLE ONLY CRITICAL RESULT CALLED TO, READ BACK BY AND VERIFIED WITH: Shelda Jakes PharmD 17:10 11/19/18 ([email or web address])    Culture   Final    Lonell Grandchild NEGATIVE RODS IDENTIFICATION AND SUSCEPTIBILITIES TO FOLLOW Performed at [facility], [place] [address], [place], [place] [address]    Report Status PENDING  Incomplete  Blood Culture ID Panel (Reflexed)     Status: None   Collection Time: 11/18/18 10:48 PM  Result Value Ref Range Status   Enterococcus species NOT DETECTED NOT DETECTED Final   Listeria monocytogenes NOT DETECTED NOT DETECTED Final   Staphylococcus species NOT DETECTED NOT DETECTED Final   Staphylococcus aureus (BCID) NOT DETECTED NOT DETECTED Final   Streptococcus species NOT DETECTED NOT DETECTED Final   Streptococcus agalactiae NOT DETECTED NOT DETECTED Final   Streptococcus pneumoniae NOT DETECTED NOT DETECTED Final   Streptococcus pyogenes NOT DETECTED NOT DETECTED Final   Acinetobacter baumannii NOT DETECTED NOT DETECTED Final   Enterobacteriaceae species NOT DETECTED NOT DETECTED Final   Enterobacter cloacae complex NOT DETECTED NOT DETECTED Final   Escherichia coli NOT DETECTED NOT DETECTED Final   Klebsiella oxytoca NOT DETECTED NOT DETECTED Final   Klebsiella pneumoniae NOT DETECTED NOT DETECTED Final   Proteus species NOT DETECTED NOT DETECTED Final   Serratia marcescens NOT DETECTED NOT DETECTED Final   Haemophilus influenzae NOT DETECTED NOT DETECTED Final   Neisseria meningitidis NOT DETECTED NOT DETECTED Final   Pseudomonas aeruginosa NOT DETECTED NOT DETECTED Final   Candida albicans NOT DETECTED NOT DETECTED Final   Candida glabrata NOT DETECTED NOT DETECTED Final   Candida krusei NOT DETECTED NOT DETECTED Final   Candida parapsilosis NOT DETECTED NOT DETECTED Final   Candida tropicalis NOT DETECTED NOT DETECTED Final    Comment: Performed at [facility], [address], [place], [place] [address]  SARS Coronavirus 2 [facility] order, Performed in [facility] hospital lab)     Status: None   Collection Time: 11/18/18 11:12 PM  Result Value Ref Range Status   SARS Coronavirus 2 NEGATIVE NEGATIVE Final    Comment: (NOTE) If result is NEGATIVE SARS-CoV-2 target nucleic  acids are NOT DETECTED. The SARS-CoV-2 RNA is generally detectable in upper and lower  respiratory specimens during the acute phase of infection. The lowest  concentration of SARS-CoV-2 viral copies this assay can detect is 250  copies / mL. A negative result does not preclude SARS-CoV-2 infection  and should not be used as the sole basis for treatment or other  patient management decisions.  A negative result may occur with  improper specimen collection / handling, submission of specimen other  than nasopharyngeal swab, presence  of viral mutation(s) within the  areas targeted by this assay, and inadequate number of viral copies  (<250 copies / mL). A negative result must be combined with clinical  observations, patient history, and epidemiological information. If result is POSITIVE SARS-CoV-2 target nucleic acids are DETECTED. The SARS-CoV-2 RNA is generally detectable in upper and lower  respiratory specimens dur ing the acute phase of infection.  Positive  results are indicative of active infection with SARS-CoV-2.  Clinical  correlation with patient history and other diagnostic information is  necessary to determine patient infection status.  Positive results do  not rule out bacterial infection or co-infection with other viruses. If result is PRESUMPTIVE POSTIVE SARS-CoV-2 nucleic acids MAY BE PRESENT.   A presumptive positive result was obtained on the submitted specimen  and confirmed on repeat testing.  While 2019 novel coronavirus  (SARS-CoV-2) nucleic acids may be present in the submitted sample  additional confirmatory testing may be necessary for epidemiological  and / or clinical management purposes  to differentiate between  SARS-CoV-2 and other Sarbecovirus currently known to infect humans.  If clinically indicated additional testing with an alternate test  methodology [phone]) is advised. The SARS-CoV-2 RNA is generally  detectable in upper and lower respiratory  sp ecimens during the acute  phase of infection. The expected result is Negative. Fact Sheet for Patients:  StrictlyIdeas.no Fact Sheet for Healthcare Providers: BankingDealers.co.za This test is not yet approved or cleared by the [place] FDA and has been authorized for detection and/or diagnosis of SARS-CoV-2 by FDA under an Emergency Use Authorization (EUA).  This EUA will remain in effect (meaning this test can be used) for the duration of the COVID-19 declaration under Section 564(b)(1) of the Act, 21 U.S.C. section 360bbb-3(b)(1), unless the authorization is terminated or revoked sooner. Performed at [facility], [place] [address] [place], [place] [address]   Blood Culture (routine x 2)     Status: None (Preliminary result)   Collection Time: 11/18/18 11:26 PM  Result Value Ref Range Status   Specimen Description   Final    BLOOD BLOOD LEFT FOREARM Performed at [facility] [address], [place], [place] [address]    Special Requests   Final    BOTTLES DRAWN AEROBIC AND ANAEROBIC Blood Culture adequate volume Performed at [facility] [address] [place], [place] [address]    Culture   Final    NO GROWTH 1 DAY Performed at [facility], [place] [address], [place], [place] [address]    Report Status PENDING  Incomplete  Respiratory Panel by PCR     Status: None   Collection Time: 11/19/18  2:36 AM  Result Value Ref Range Status   Adenovirus NOT DETECTED NOT DETECTED Final   Coronavirus 229E NOT DETECTED NOT DETECTED Final    Comment: (NOTE) The Coronavirus on the Respiratory Panel, DOES NOT test for the novel  Coronavirus (2019 nCoV)    Coronavirus HKU1 NOT DETECTED NOT DETECTED Final   Coronavirus NL63 NOT DETECTED NOT DETECTED Final   Coronavirus OC43 NOT DETECTED NOT DETECTED Final   Metapneumovirus NOT DETECTED NOT DETECTED Final   Rhinovirus / Enterovirus  NOT DETECTED NOT DETECTED Final   Influenza A NOT DETECTED NOT DETECTED Final   Influenza B NOT DETECTED NOT DETECTED Final   Parainfluenza Virus 1 NOT DETECTED NOT DETECTED Final   Parainfluenza Virus 2 NOT DETECTED NOT DETECTED Final   Parainfluenza Virus 3 NOT  DETECTED NOT DETECTED Final   Parainfluenza Virus 4 NOT DETECTED NOT DETECTED Final   Respiratory Syncytial Virus NOT DETECTED NOT DETECTED Final   Bordetella pertussis NOT DETECTED NOT DETECTED Final   Chlamydophila pneumoniae NOT DETECTED NOT DETECTED Final   Mycoplasma pneumoniae NOT DETECTED NOT DETECTED Final    Comment: Performed at [facility], [place] [address] [place], [place] [address]  Culture, sputum-assessment     Status: None   Collection Time: 11/20/18  7:40 PM  Result Value Ref Range Status   Specimen Description SPUTUM  Final   Special Requests NONE  Final   Sputum evaluation   Final    THIS SPECIMEN IS ACCEPTABLE FOR SPUTUM CULTURE Performed at [facility], [place] [address] [place], [place] [address]    Report Status 11/20/2018 FINAL  Final      Studies:  Ct Abdomen Wo Contrast  Result Date: 11/20/2018 CLINICAL DATA:  Abdominal pain status post cholecystectomy. EXAM: CT ABDOMEN WITHOUT CONTRAST TECHNIQUE: Multidetector CT imaging of the abdomen was performed following the standard protocol without IV contrast. COMPARISON:  Ultrasound of same day.  CT scan of June 07, 2018. FINDINGS: Lower chest: No acute abnormality. Hepatobiliary: Status post cholecystectomy. There is now noted 9.4 x 8.4 cm air-fluid collection with surrounding inflammation seen in the gallbladder fossa concerning for possible abscess or biloma. Pneumobilia is noted in left hepatic lobe. No definite intrahepatic biliary dilatation is noted. Pancreas: Large stent is seen in distal common bile duct extending into duodenum for treatment of pancreatic head mass noted on prior studies. Spleen: Normal in size without focal  abnormality. Adrenals/Urinary Tract: Adrenal glands and kidneys appear normal. No hydronephrosis or renal obstruction is noted. No renal calculi are noted. Stomach/Bowel: The stomach appears normal. There is no evidence of bowel obstruction. Vascular/Lymphatic: Aortic atherosclerosis. No enlarged abdominal lymph nodes. Other: No abdominal wall hernia or abnormality. Musculoskeletal: No acute or significant osseous findings. IMPRESSION: 9.4 x 8.4 cm air-fluid collection is noted in gallbladder fossa status post cholecystectomy, concerning for possible abscess or biloma. These results will be called to the ordering clinician or representative by the Radiologist Assistant, and communication documented in the PACS or zVision Dashboard. Large stent is noted in distal common bile duct extending into duodenum for treatment of pancreatic head mass. Electronically Signed   By: Marijo Conception M.D.   On: 11/20/2018 15:58   US Abdomen Complete  Result Date: 11/20/2018 CLINICAL DATA:  Right upper quadrant abdominal pain. EXAM: ABDOMEN ULTRASOUND COMPLETE COMPARISON:  MRI of October 24, 2018. FINDINGS: Gallbladder: Status post cholecystectomy. Complex rounded abnormality measuring 9.8 x 7.8 cm is seen in the gallbladder fossa which may represent hematoma, biloma or possible abscess. Common bile duct: Diameter: 4 mm which is within normal limits. Liver: No focal lesion identified. Within normal limits in parenchymal echogenicity. Portal vein is patent on color Doppler imaging with normal direction of blood flow towards the liver. IVC: No abnormality visualized. Pancreas: Irregular hypoechoic abnormality measuring 4.0 x 3.5 x 1.8 cm is seen in the pancreatic head consistent with a history of pancreatic malignancy. No ductal dilatation is noted. Spleen: Spleen measures 12.4 x 12.8 x 6.5 cm with calculated volume of 530 cubic cm, consistent with mild splenomegaly. No focal abnormality is noted. Right Kidney: Length: 12.7 cm.  Echogenicity within normal limits. No mass or hydronephrosis visualized. Left Kidney: Length: 12.9 cm. Echogenicity within normal limits. No mass or hydronephrosis visualized. Abdominal aorta: No aneurysm visualized. Other findings:  None. IMPRESSION: Complex rounded abnormality seen in gallbladder fossa status post recent cholecystectomy. This is concerning for possible biloma, abscess or hematoma. Further evaluation with CT scan of the abdomen is recommended. These results will be called to the ordering clinician or representative by the Radiologist Assistant, and communication documented in the PACS or zVision Dashboard. 4.0 cm irregular hypoechoic abnormality seen in pancreatic head consistent with history of pancreatic neoplasm. Mild splenomegaly. Electronically Signed   By: Marijo Conception M.D.   On: 11/20/2018 13:32   Dg Chest Port 1 View  Result Date: 11/19/2018 CLINICAL DATA:  58 y/o  M; cough and fever. EXAM: PORTABLE CHEST 1 VIEW COMPARISON:  11/17/2018 chest radiograph. FINDINGS: Stable normal cardiac silhouette given projection and technique. Stable bronchitic changes in the lung bases. No new consolidation, effusion, or pneumothorax. Bones are unremarkable. IMPRESSION: Stable bronchitic changes in lung bases. No new consolidation. Electronically Signed   By: Kristine Garbe M.D.   On: 11/19/2018 00:26   Nm Hepato Biliary Leak  Result Date: 11/20/2018 CLINICAL DATA:  Cholecystectomy on 11/10/2018. Mid to low back pain for 6 days with fever. EXAM: NUCLEAR MEDICINE HEPATOBILIARY IMAGING TECHNIQUE: Sequential images of the abdomen were obtained out to 60 minutes following intravenous administration of radiopharmaceutical. RADIOPHARMACEUTICALS:  5.3 mCi Tc-49m  Choletec IV COMPARISON:  CT scan from earlier today. FINDINGS: Brisk uptake of radiotracer by the hepatic parenchyma noted with clearance of blood pool activity by 5-10 minutes. Biliary activity is visible by 10 minutes and gut  activity is visible by 10-15 minutes. No accumulation of radiotracer in the gallbladder fossa fluid collection seen on previous CT. No accumulation of radiotracer along the surface of the liver. No evidence for pooling of radiotracer in the peritoneal cavity. IMPRESSION: 1. Normal hepatobiliary patency study. 2. No evidence for bile leak. Electronically Signed   By: Misty Stanley M.D.   On: 11/20/2018 18:13    Assessment: 58 y.o. with metastatic pancreatic cancer to liver, s/p chemo and upper scopic cholecystectomy (Whipple surgery was aborted due to liver mets) on 4/6, presented with fever, chills, shortness of breath and hypotension.  1. Sepsis secondary to GNR bacteriemia  2. 9.4cm air-fluid collection at gallbladder fossa, possible abscess, status post cholecystectomy on 4/6 3.  Acute bronchitis, COVID-19 and RVP negative  4.  Metastatic pancreatic cancer to liver 5.  History of left subclavicular thrombosis, on xarelto  6. Hypotension secondary to sepsis resolved   Plan:  -I reviewed his [name] and CT scan finding, I anticipate surgical and IR consult, and possible percutaneous drainage of the gallbladder fossa abscess -continue iv antibiotics, f/u culture results  -if surgery or procedure is planned, xarelto will be held, if more than 24 hours, then heparin bridging is preferred due to high risk of thrombosis from metastatic pancreatic cancer and recent left [place] thrombosis  -I will f/u on Monday if he remains in the hospital. Please call us if needed over the weekend. -I called his wife and left a VM to update his condition    [name], MD 11/20/2018

## 2018-11-20 NOTE — Progress Notes (Signed)
Patient ID: Ernest Wu, male   DOB: 04/17/1961, 58 y.o.   MRN: [id number] Called by Dr Darrick Meigs to see Ernest Wu. He is off floor getting imaging for a while.  I have reviewed his [name] and op report. He is stage IV pancreatic cancer and underwent lap chole for what appeared to be chronic cholecystitis 10 d ago along with liver biopsy that showed metastatic disease after neoadjuvant chemotherapy.  He returned with fever and possible sepsis. sars-coronavirus test negative.  He is on xarelto.  His transaminases are mildly elevated.  Wbc normal.  He had [name] that shows a pancreatic head malignancy and a 9.8x7.8 cm gallbladder fossa collection. I discussed with Dr Darrick Meigs and he is currently getting hida scan to see if leak as well as IR consult (who want a noncon ct scan) to drain this area. I do not think he needs surgery for this. Will plan to see him in am

## 2018-11-21 ENCOUNTER — Inpatient Hospital Stay (HOSPITAL_COMMUNITY): Payer: Managed Care, Other (non HMO)

## 2018-11-21 ENCOUNTER — Encounter (HOSPITAL_COMMUNITY): Payer: Self-pay | Admitting: Radiology

## 2018-11-21 DIAGNOSIS — L0291 Cutaneous abscess, unspecified: Secondary | ICD-10-CM

## 2018-11-21 LAB — BASIC METABOLIC PANEL
Anion gap: 9 (ref 5–15)
BUN: 7 mg/dL (ref 6–20)
CO2: 22 mmol/L (ref 22–32)
Calcium: 8.2 mg/dL — ABNORMAL LOW (ref 8.9–10.3)
Chloride: 104 mmol/L (ref 98–111)
Creatinine, Ser: 0.73 mg/dL (ref 0.61–1.24)
GFR calc Af Amer: >60 mL/min (ref >60)
GFR calc non Af Amer: >60 mL/min (ref >60)
Glucose, Bld: 116 mg/dL — ABNORMAL HIGH (ref 70–99)
Potassium: 3.7 mmol/L (ref 3.5–5.1)
Sodium: 135 mmol/L (ref 135–145)

## 2018-11-21 LAB — CBC
HCT: 32.2 % — ABNORMAL LOW (ref 39.0–52.0)
Hemoglobin: 10.4 g/dL — ABNORMAL LOW (ref 13.0–17.0)
MCH: 31.5 pg (ref 26.0–34.0)
MCHC: 32.3 g/dL (ref 30.0–36.0)
MCV: 97.6 fL (ref 80.0–100.0)
Platelets: 183 10*3/uL (ref 150–400)
RBC: 3.3 MIL/uL — ABNORMAL LOW (ref 4.22–5.81)
RDW: 14.8 % (ref 11.5–15.5)
WBC: 7.9 10*3/uL (ref 4.0–10.5)
nRBC: 0 % (ref 0.0–0.2)

## 2018-11-21 LAB — APTT: aPTT: 33 seconds (ref 24–36)

## 2018-11-21 LAB — PROTIME-INR
INR: 1.1 (ref 0.8–1.2)
Prothrombin Time: 13.8 seconds (ref 11.4–15.2)

## 2018-11-21 LAB — INTERLEUKIN-6, PLASMA: Interleukin-6, Plasma: >3000 pg/mL — AB (ref 0.0–12.2)

## 2018-11-21 LAB — HEPARIN LEVEL (UNFRACTIONATED): Heparin Unfractionated: <0.1 IU/mL — ABNORMAL LOW (ref 0.30–0.70)

## 2018-11-21 MED ORDER — HEPARIN (PORCINE) 25000 UT/250ML-% IV SOLN
1400.0000 [IU]/h | INTRAVENOUS | Status: DC
  Administered 2018-11-21: 1400 [IU]/h via INTRAVENOUS
  Filled 2018-11-21: qty 250

## 2018-11-21 MED ORDER — MIDAZOLAM HCL 2 MG/2ML IJ SOLN
INTRAMUSCULAR | Status: AC | PRN
  Administered 2018-11-21 (×4): 1 mg via INTRAVENOUS

## 2018-11-21 MED ORDER — MIDAZOLAM HCL 2 MG/2ML IJ SOLN
INTRAMUSCULAR | Status: AC
  Filled 2018-11-21: qty 4

## 2018-11-21 MED ORDER — SODIUM CHLORIDE 0.9% FLUSH
5.0000 mL | Freq: Three times a day (TID) | INTRAVENOUS | Status: DC
  Administered 2018-11-21 – 2018-11-24 (×8): 5 mL

## 2018-11-21 MED ORDER — MORPHINE SULFATE (PF) 2 MG/ML IV SOLN
1.0000 mg | Freq: Once | INTRAVENOUS | Status: AC
  Administered 2018-11-21: 1 mg via INTRAVENOUS
  Filled 2018-11-21: qty 1

## 2018-11-21 MED ORDER — FENTANYL CITRATE (PF) 100 MCG/2ML IJ SOLN
INTRAMUSCULAR | Status: AC
  Filled 2018-11-21: qty 2

## 2018-11-21 MED ORDER — MORPHINE SULFATE (PF) 2 MG/ML IV SOLN
2.0000 mg | INTRAVENOUS | Status: DC | PRN
  Administered 2018-11-21: 23:00:00 2 mg via INTRAVENOUS
  Filled 2018-11-21: qty 1

## 2018-11-21 MED ORDER — LIDOCAINE HCL (PF) 1 % IJ SOLN
INTRAMUSCULAR | Status: AC | PRN
  Administered 2018-11-21: 10 mL

## 2018-11-21 MED ORDER — FENTANYL CITRATE (PF) 100 MCG/2ML IJ SOLN
INTRAMUSCULAR | Status: AC | PRN
  Administered 2018-11-21 (×3): 50 ug via INTRAVENOUS

## 2018-11-21 NOTE — Progress Notes (Signed)
ANTICOAGULATION CONSULT NOTE - Initial Consult  Pharmacy Consult for heparin Indication: VTE treatment  Allergies  Allergen Reactions  . Contrast Media [Iodinated Diagnostic Agents] Other (See Comments)    Burning sensation [SEE CONTRAST MEDIA]  . Iohexol Anaphylaxis, Itching and Rash    Patient Measurements: Height: 6' (182.9 cm) Weight: 197 lb 1.5 oz (89.4 kg) IBW/kg (Calculated) : 77.6 Heparin Dosing Weight: ABW  Vital Signs: Temp: 98.8 F (37.1 C) (04/17 1503) Temp Source: Oral (04/17 1503) BP: 131/85 (04/17 1503) Pulse Rate: 88 (04/17 1503)  Labs: Recent Labs    11/18/18 2300  11/19/18 0548 11/19/18 1048 11/19/18 1723 11/20/18 0910 11/21/18 0453  HGB 11.9*   < >  --   --   --  10.5* 10.4*  HCT 36.9*  --   --   --   --  32.6* 32.2*  PLT 158  --   --   --   --  158 183  LABPROT  --   --   --   --   --   --  13.8  INR  --   --   --   --   --   --  1.1  CREATININE 0.92  --   --   --  0.71 0.76 0.73  TROPONINI  --    < > 0.06* 0.05* 0.05*  --   --    < > = values in this interval not displayed.    Estimated Creatinine Clearance: 107.8 mL/min (by C-G formula based on SCr of 0.73 mg/dL).   Medical History: Past Medical History:  Diagnosis Date  . Anxiety   . Cancer ([place]) 06/2018   Pancreatic  . GERD (gastroesophageal reflux disease)       Assessment: 58 y.o. 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  Admitted with abd pain.  Pharmacy consulted to dose heparin.  Last dose of xarelto 4/15 @ 0925  11/21/2018  H/H low Plts WNL Stat HL and aPTT ordered Scr WNL  Goal of Therapy:  Heparin level 0.3-0.7 units/ml  APTT 66-102 seconds Monitor platelets by anticoagulation protocol   Plan:  Start heparin drip at 1400 units/hr, no bolus Check aPTT and heparin level in 8 hours Daily CBC and heparin level  Dolly Rias RPh 11/21/2018, 3:19 PM Pager  [phone]    Angela Adam 11/21/2018,3:08 PM

## 2018-11-21 NOTE — Consult Note (Signed)
Chief Complaint: Patient was seen in consultation today for gallbladder fossa abscess/biloma aspiration and possible drain placement.  Referring Physician(s): Dr. Eleonore Chiquito  Supervising Physician: Jacqulynn Cadet  Patient Status: [facility] - In-pt  History of Present Illness: [facility] is a 58 y.o. 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 Barry Dienes who initially presented to urgent care on 11/17/18 with complaints of fever, abdominal pain, dyspnea and cough - he was diagnosed with bronchitis and given azithromycin prior to being discharged home. He then presented to [facility] ED via EMS the following day due to worsening dyspnea and RUQ abdominal pain as well as new onset of shaking chills with fever. Sepsis workup revealed new lymphocytopenia, elevated LFTs and lactic acidosis - he was then admitted for further evaluation and management. He was started on IV antibiotics and his fever, cough and dyspnea improved. He was found to be negative for COVID. He continued to have RUQ abdominal pain and abdominal US was performed on 11/20/18 which was concerning for gallbladder fossa abscess vs biloma. IR was consulted for possible aspiration and drain placement. A CT abdomen w/o contrast was ordered by IR for further evaluation which again showed a 9.4 cm x 8.4 cm fluid-filled collection in the gallbladder fossa concerning for possible abscess or biloma. He also underwent a NM scan that same day to assess for biliary leak which was found to be negative. After review of the CT abdomen patient has been approved for gallbladder fossa abscess/biloma aspiration with possible drain placement today.  [name] is on the phone with his son Corene Cornea today during my exam, he reports continued nausea and RUQ abdominal pain that also sometimes radiates to the middle of his  abdomen. He is concerned that this abscess may be affecting his bowel movements as he has been constipated lately. He denies any other complaints. He and Corene Cornea both state understanding of the procedure requested and wish to proceed.   Past Medical History:  Diagnosis Date   Anxiety    Cancer ([place]) 06/2018   Pancreatic   GERD (gastroesophageal reflux disease)     Past Surgical History:  Procedure Laterality Date   BILIARY STENT PLACEMENT  06/10/2018   Procedure: BILIARY STENT PLACEMENT;  Surgeon: Ronnette Juniper, MD;  Location: [place];  Service: Gastroenterology;;   CHOLECYSTECTOMY N/A 11/10/2018   Procedure: Laparoscopic Cholecystectomy;  Surgeon: Stark Klein, MD;  Location: [place];  Service: General;  Laterality: N/A;   DIAGNOSTIC LAPAROSCOPIC LIVER BIOPSY N/A 11/10/2018   Procedure: Diagnostic Laparoscopic Liver Biopsy;  Surgeon: Stark Klein, MD;  Location: [place];  Service: General;  Laterality: N/A;   ERCP N/A 06/10/2018   Procedure: ENDOSCOPIC RETROGRADE CHOLANGIOPANCREATOGRAPHY (ERCP);  Surgeon: Ronnette Juniper, MD;  Location: [place];  Service: Gastroenterology;  Laterality: N/A;   ESOPHAGOGASTRODUODENOSCOPY (EGD) WITH PROPOFOL N/A 06/10/2018   Procedure: ESOPHAGOGASTRODUODENOSCOPY (EGD) WITH PROPOFOL;  Surgeon: Ronnette Juniper, MD;  Location: [place];  Service: Gastroenterology;  Laterality: N/A;   FINE NEEDLE ASPIRATION  06/10/2018   Procedure: FINE NEEDLE ASPIRATION (FNA) LINEAR;  Surgeon: Ronnette Juniper, MD;  Location: [facility] ENDOSCOPY;  Service: Gastroenterology;;   PORT-A-CATH REMOVAL N/A 11/10/2018   Procedure: REMOVAL PORT-A-CATH;  Surgeon: Stark Klein, MD;  Location: [place];  Service: General;  Laterality: N/A;   PORTACATH PLACEMENT N/A 07/02/2018   Procedure: INSERTION PORT-A-CATH;  Surgeon: Stark Klein, MD;  Location: [place];  Service: General;  Laterality: N/A;   SPHINCTEROTOMY  06/10/2018   Procedure: SPHINCTEROTOMY;  Surgeon: Ronnette Juniper, MD;  Location: [facility]  ENDOSCOPY;  Service: Gastroenterology;;   UPPER ESOPHAGEAL ENDOSCOPIC ULTRASOUND (EUS) N/A 06/10/2018   Procedure: UPPER ESOPHAGEAL ENDOSCOPIC ULTRASOUND (EUS);  Surgeon: Ronnette Juniper, MD;  Location: [place];  Service: Gastroenterology;  Laterality: N/A;   VASECTOMY      Allergies: Contrast media [iodinated diagnostic agents] and Iohexol  Medications: Prior to Admission medications   Medication Sig Start Date End Date Taking? Authorizing Provider  ALPRAZolam (XANAX) 0.25 MG tablet Take 1 tablet (0.25 mg total) by mouth at bedtime as needed for anxiety. 07/07/18  Yes [name], MD  azithromycin (ZITHROMAX) 250 MG tablet Take 2 tablets (500 mg total) by mouth daily for 1 day, THEN 1 tablet (250 mg total) daily for 4 days. 11/17/18 11/22/18 Yes Burky, Malachy Moan, NP  diphenoxylate-atropine (LOMOTIL) 2.5-0.025 MG tablet Take 2 tablets by mouth 4 (four) times daily as needed for diarrhea or loose stools. 06/27/18  Yes Alla Feeling, NP  docusate sodium (COLACE) 100 MG capsule Take 200 mg by mouth daily as needed for mild constipation.   Yes [provider]  esomeprazole (NEXIUM) 20 MG capsule Take 20 mg by mouth daily after breakfast.    Yes [provider]  folic acid (FOLVITE) 1 MG tablet Take 1 mg by mouth daily after breakfast.  08/15/18  Yes [provider]  ibuprofen (ADVIL,MOTRIN) 200 MG tablet Take 400 mg by mouth daily as needed for headache or moderate pain.    Yes [provider]  loratadine (CLARITIN) 10 MG tablet Take 10 mg by mouth daily after breakfast.   Yes [provider]  rivaroxaban (XARELTO) 20 MG TABS tablet Take 1 tablet (20 mg total) by mouth daily with supper. Patient taking differently: Take 20 mg by mouth daily after breakfast.  10/02/18  Yes [name], MD  simethicone (MYLICON) 956 MG chewable tablet Chew 250 mg by mouth every 6 (six) hours as needed for flatulence.    Yes [provider]  enoxaparin (LOVENOX) 120  MG/0.8ML injection Inject 0.8 mLs (120 mg total) into the skin daily. Patient not taking: Reported on 11/19/2018 11/03/18   [name], MD  magic mouthwash w/lidocaine SOLN Take 5 mLs by mouth 4 (four) times daily as needed for mouth pain. Patient not taking: Reported on 11/19/2018 08/22/18   Harle Stanford., PA-C  oxyCODONE (OXY IR/ROXICODONE) 5 MG immediate release tablet Take 1 tablet (5 mg total) by mouth every 6 (six) hours as needed for severe pain. Patient not taking: Reported on 11/19/2018 07/02/18   Stark Klein, MD  oxyCODONE (OXY IR/ROXICODONE) 5 MG immediate release tablet Take 1 tablet (5 mg total) by mouth every 6 (six) hours as needed for severe pain. Patient not taking: Reported on 11/19/2018 11/10/18   Stark Klein, MD  prochlorperazine (COMPAZINE) 10 MG tablet Take 1 tablet (10 mg total) by mouth every 6 (six) hours as needed (NAUSEA). 06/29/18 11/12/18  [name], MD     Family History  Problem Relation Age of Onset   Stroke Mother    Hypertension Mother    CAD Father        CABG    Social History   Socioeconomic History   Marital status: Married    Spouse name: Not on file   Number of children: 5   Years of education: Not on file   Highest education level: Not on file  Occupational History   Not on file  Social Needs   Financial resource strain: Not on file   Food insecurity:    Worry: Not on file    Inability: Not on file   Transportation needs:    Medical: Not on file    Non-medical: Not on file  Tobacco Use   Smoking status: Current Every Day Smoker    Packs/day: 0.25    Years: 30.00    Pack years: 7.50   Smokeless tobacco: Never Used   Tobacco comment: cut back considerably  Substance and Sexual Activity   Alcohol use: Yes    Comment: 6-7 beers Q week   Drug use: Never   Sexual activity: Not on file  Lifestyle   Physical activity:    Days per week: Not on file    Minutes per session: Not on file   Stress: Not on file    Relationships   Social connections:    Talks on phone: Not on file    Gets together: Not on file    Attends religious service: Not on file    Active member of club or organization: Not on file    Attends meetings of clubs or organizations: Not on file    Relationship status: Not on file  Other Topics Concern   Not on file  Social History Narrative   Not on file     Review of Systems: A 12 point ROS discussed and pertinent positives are indicated in the HPI above.  All other systems are negative.  Review of Systems  Constitutional: Negative for chills and fever.  Respiratory: Negative for cough and shortness of breath.   Cardiovascular: Negative for chest pain.  Gastrointestinal: Positive for abdominal pain, constipation and nausea. Negative for diarrhea and vomiting.  Genitourinary: Negative for dysuria and hematuria.  Musculoskeletal: Negative for back pain.    Vital Signs: BP 125/81 (BP Location: Left Arm)    Pulse 92    Temp (!) 97.4 F (36.3 C) (Oral) Comment: pt's nurse acknowledged   Resp 16    Ht 6' (1.829 m)    Wt 197 lb 1.5 oz (89.4 kg)    SpO2 97%    BMI 26.73 kg/m   Physical Exam Vitals signs and nursing note reviewed.  Constitutional:      General: He is not in acute distress.    Comments: Very pleasant, talkative, good historian. Corene Cornea (son) on speaker phone during exam.  HENT:     Head: Normocephalic and atraumatic.  Cardiovascular:     Rate and Rhythm: Normal rate and regular rhythm.  Pulmonary:     Effort: Pulmonary effort is normal.     Breath sounds: Normal breath sounds.  Abdominal:     General: There is no distension.     Palpations: Abdomen is soft.     Tenderness: There is abdominal tenderness (RUQ).  Skin:    General: Skin is warm and dry.     Coloration: Skin is not jaundiced.  Neurological:     Mental Status: He is alert and oriented to person, place, and time.  Psychiatric:        Mood and Affect: Mood normal.        Behavior:  Behavior normal.        Thought Content: Thought content normal.        Judgment: Judgment normal.      MD Evaluation Airway: WNL((+) full upper dentures) Heart: WNL Abdomen: WNL Chest/ Lungs: WNL ASA  Classification: 3 Mallampati/Airway Score: One   Imaging: Ct Abdomen Wo Contrast  Result Date: 11/20/2018 CLINICAL DATA:  Abdominal pain status post cholecystectomy. EXAM: CT ABDOMEN WITHOUT CONTRAST TECHNIQUE: Multidetector CT imaging of the abdomen was performed following the standard protocol without IV contrast. COMPARISON:  Ultrasound of same day.  CT scan of June 07, 2018. FINDINGS: Lower chest: No acute abnormality. Hepatobiliary: Status post cholecystectomy. There is now noted 9.4 x 8.4 cm air-fluid collection with surrounding inflammation seen in the gallbladder fossa concerning for possible abscess or biloma. Pneumobilia is noted in left hepatic lobe. No definite intrahepatic biliary dilatation is noted. Pancreas: Large stent is seen in distal common bile duct extending into duodenum for treatment of pancreatic head mass noted on prior studies. Spleen: Normal in size without focal abnormality. Adrenals/Urinary Tract: Adrenal glands and kidneys appear normal. No hydronephrosis or renal obstruction is noted. No renal calculi are noted. Stomach/Bowel: The stomach appears normal. There is no evidence of bowel obstruction. Vascular/Lymphatic: Aortic atherosclerosis. No enlarged abdominal lymph nodes. Other: No abdominal wall hernia or abnormality. Musculoskeletal: No acute or significant osseous findings. IMPRESSION: 9.4 x 8.4 cm air-fluid collection is noted in gallbladder fossa status post cholecystectomy, concerning for possible abscess or biloma. These results will be called to the ordering clinician or representative by the Radiologist Assistant, and communication documented in the PACS or zVision Dashboard. Large stent is noted in distal common bile duct extending into duodenum for  treatment of pancreatic head mass. Electronically Signed   By: Marijo Conception M.D.   On: 11/20/2018 15:58   Dg Chest 2 View  Result Date: 11/17/2018 CLINICAL DATA:  Shortness of breath, fever, and bilateral rib pain. History of smoking and pancreatic cancer. EXAM: CHEST - 2 VIEW COMPARISON:  07/18/2018 FINDINGS: Left subclavian Port-A-Cath has been removed. The cardiomediastinal silhouette is within normal limits. The lungs are mildly hyperinflated with peribronchial thickening. No confluent airspace opacity, overt pulmonary edema, pleural effusion, pneumothorax is identified. No acute osseous abnormality is seen. IMPRESSION: Bronchitic changes. Electronically Signed   By: Logan Bores M.D.   On: 11/17/2018 13:24   Mr Abdomen W Wo Contrast  Result Date: 10/24/2018 CLINICAL DATA:  Restaging pancreatic cancer. EXAM: MRI ABDOMEN WITHOUT AND WITH CONTRAST TECHNIQUE: Multiplanar multisequence MR imaging of the abdomen was performed both before and after the administration of intravenous contrast. CONTRAST:  9 cc Gadavist COMPARISON:  MRI abdomen 09/03/2018 FINDINGS: Lower chest: The lung bases are clear of an acute process. No worrisome pulmonary lesions. No pleural or pericardial effusion. Hepatobiliary: No focal hepatic lesions to suggest metastatic disease. The gallbladder is normal. No intra or extrahepatic biliary dilatation. There is a common bile duct stent in place which appears in good position. No complicating features. Pancreas: The pancreatic lesion at the head body junction appears slightly smaller. It measures approximately 2.5 x 2.4 cm on image number 53 of series 1002. It previously measured 3.0 x 2.6 cm. Adjacent 7 mm celiac axis lymph node previously measured 8 mm. 11 mm lymph node between the portal vein and IVC is stable. No findings for direct vascular invasion. Stable moderate atrophy of the body and tail and stable pancreatic ductal dilatation upstream from the lesion. Spleen: Normal size.  No focal lesions. The splenic vein is patent. Adrenals/Urinary Tract: The adrenal glands and kidneys are unremarkable. Stomach/Bowel: The stomach, duodenum, visualized small bowel and visualize colon are unremarkable. Vascular/Lymphatic: The aorta and branch vessels are patent. The major venous structures are patent. Small scattered retroperitoneal  lymph nodes are unchanged. Other:  No ascites or abdominal wall hernia. Musculoskeletal: No significant bony findings. IMPRESSION: 1. Slight interval decrease in size of the pancreatic mass. 2. Stable to slightly smaller adjacent lymph nodes. 3. No findings for metastatic disease involving the liver or lung bases. 4. Common bile duct stent in good position without complicating features. Electronically Signed   By: Marijo Sanes M.D.   On: 10/24/2018 15:19   US Abdomen Complete  Result Date: 11/20/2018 CLINICAL DATA:  Right upper quadrant abdominal pain. EXAM: ABDOMEN ULTRASOUND COMPLETE COMPARISON:  MRI of October 24, 2018. FINDINGS: Gallbladder: Status post cholecystectomy. Complex rounded abnormality measuring 9.8 x 7.8 cm is seen in the gallbladder fossa which may represent hematoma, biloma or possible abscess. Common bile duct: Diameter: 4 mm which is within normal limits. Liver: No focal lesion identified. Within normal limits in parenchymal echogenicity. Portal vein is patent on color Doppler imaging with normal direction of blood flow towards the liver. IVC: No abnormality visualized. Pancreas: Irregular hypoechoic abnormality measuring 4.0 x 3.5 x 1.8 cm is seen in the pancreatic head consistent with a history of pancreatic malignancy. No ductal dilatation is noted. Spleen: Spleen measures 12.4 x 12.8 x 6.5 cm with calculated volume of 530 cubic cm, consistent with mild splenomegaly. No focal abnormality is noted. Right Kidney: Length: 12.7 cm. Echogenicity within normal limits. No mass or hydronephrosis visualized. Left Kidney: Length: 12.9 cm. Echogenicity  within normal limits. No mass or hydronephrosis visualized. Abdominal aorta: No aneurysm visualized. Other findings: None. IMPRESSION: Complex rounded abnormality seen in gallbladder fossa status post recent cholecystectomy. This is concerning for possible biloma, abscess or hematoma. Further evaluation with CT scan of the abdomen is recommended. These results will be called to the ordering clinician or representative by the Radiologist Assistant, and communication documented in the PACS or zVision Dashboard. 4.0 cm irregular hypoechoic abnormality seen in pancreatic head consistent with history of pancreatic neoplasm. Mild splenomegaly. Electronically Signed   By: Marijo Conception M.D.   On: 11/20/2018 13:32   Mr 3d Recon At Scanner  Result Date: 10/24/2018 CLINICAL DATA:  Restaging pancreatic cancer. EXAM: MRI ABDOMEN WITHOUT AND WITH CONTRAST TECHNIQUE: Multiplanar multisequence MR imaging of the abdomen was performed both before and after the administration of intravenous contrast. CONTRAST:  9 cc Gadavist COMPARISON:  MRI abdomen 09/03/2018 FINDINGS: Lower chest: The lung bases are clear of an acute process. No worrisome pulmonary lesions. No pleural or pericardial effusion. Hepatobiliary: No focal hepatic lesions to suggest metastatic disease. The gallbladder is normal. No intra or extrahepatic biliary dilatation. There is a common bile duct stent in place which appears in good position. No complicating features. Pancreas: The pancreatic lesion at the head body junction appears slightly smaller. It measures approximately 2.5 x 2.4 cm on image number 53 of series 1002. It previously measured 3.0 x 2.6 cm. Adjacent 7 mm celiac axis lymph node previously measured 8 mm. 11 mm lymph node between the portal vein and IVC is stable. No findings for direct vascular invasion. Stable moderate atrophy of the body and tail and stable pancreatic ductal dilatation upstream from the lesion. Spleen: Normal size. No focal  lesions. The splenic vein is patent. Adrenals/Urinary Tract: The adrenal glands and kidneys are unremarkable. Stomach/Bowel: The stomach, duodenum, visualized small bowel and visualize colon are unremarkable. Vascular/Lymphatic: The aorta and branch vessels are patent. The major venous structures are patent. Small scattered retroperitoneal lymph nodes are unchanged. Other:  No ascites or abdominal wall hernia.  Musculoskeletal: No significant bony findings. IMPRESSION: 1. Slight interval decrease in size of the pancreatic mass. 2. Stable to slightly smaller adjacent lymph nodes. 3. No findings for metastatic disease involving the liver or lung bases. 4. Common bile duct stent in good position without complicating features. Electronically Signed   By: Marijo Sanes M.D.   On: 10/24/2018 15:19   Dg Chest Port 1 View  Result Date: 11/19/2018 CLINICAL DATA:  58 y/o  M; cough and fever. EXAM: PORTABLE CHEST 1 VIEW COMPARISON:  11/17/2018 chest radiograph. FINDINGS: Stable normal cardiac silhouette given projection and technique. Stable bronchitic changes in the lung bases. No new consolidation, effusion, or pneumothorax. Bones are unremarkable. IMPRESSION: Stable bronchitic changes in lung bases. No new consolidation. Electronically Signed   By: Kristine Garbe M.D.   On: 11/19/2018 00:26   Nm Hepato Biliary Leak  Result Date: 11/20/2018 CLINICAL DATA:  Cholecystectomy on 11/10/2018. Mid to low back pain for 6 days with fever. EXAM: NUCLEAR MEDICINE HEPATOBILIARY IMAGING TECHNIQUE: Sequential images of the abdomen were obtained out to 60 minutes following intravenous administration of radiopharmaceutical. RADIOPHARMACEUTICALS:  5.3 mCi Tc-16m  Choletec IV COMPARISON:  CT scan from earlier today. FINDINGS: Brisk uptake of radiotracer by the hepatic parenchyma noted with clearance of blood pool activity by 5-10 minutes. Biliary activity is visible by 10 minutes and gut activity is visible by 10-15 minutes.  No accumulation of radiotracer in the gallbladder fossa fluid collection seen on previous CT. No accumulation of radiotracer along the surface of the liver. No evidence for pooling of radiotracer in the peritoneal cavity. IMPRESSION: 1. Normal hepatobiliary patency study. 2. No evidence for bile leak. Electronically Signed   By: Misty Stanley M.D.   On: 11/20/2018 18:13    Labs:  CBC: Recent Labs    11/03/18 0838 11/18/18 2300 11/19/18 0236 11/20/18 0910 11/21/18 0453  WBC 5.0 3.0*  --  8.5 7.9  HGB 13.8 11.9* 10.5* 10.5* 10.4*  HCT 44.2 36.9*  --  32.6* 32.2*  PLT 139* 158  --  158 183    COAGS: Recent Labs    06/07/18 1915 07/15/18 1140 11/10/18 0810 11/21/18 0453  INR 0.98 0.93 0.9 1.1    BMP: Recent Labs    11/18/18 2300 11/19/18 1723 11/20/18 0910 11/21/18 0453  NA 133* 136 135 135  K 3.4* 3.3* 3.3* 3.7  CL 97* 107 105 104  CO2 25 21* 22 22  GLUCOSE 136* 115* 109* 116*  BUN 14 10 9 7   CALCIUM 8.3* 7.6* 8.2* 8.2*  CREATININE 0.92 0.71 0.76 0.73  GFRNONAA >60 >60 >60 >60  GFRAA >60 >60 >60 >60    LIVER FUNCTION TESTS: Recent Labs    11/03/18 0838 11/18/18 2300 11/19/18 1723 11/20/18 0910  BILITOT 0.5 1.0 0.2* 0.7  AST 27 52* 46* 53*  ALT 37 102* 84* 85*  ALKPHOS 107 206* 157* 157*  PROT 6.6 7.2 6.3* 6.5  ALBUMIN 3.8 3.0* 2.6* 2.8*    TUMOR MARKERS: No results for input(s): AFPTM, CEA, CA199, CHROMGRNA in the last 8760 hours.  Assessment and Plan:   58 y/o M with history of pancreatic cancer currently on chemotherapy and s/p laprascopic cholecystectomy 11/10/18 by Dr. Barry Dienes who presented to [facility] ED on 11/18/18 with complaints of fever, chills, cough, dyspnea and worsening abdominal pain. He was treated with IV antibiotics which improved all other presenting symptoms except for abdominal pain. Further imaging was obtained which showed concern for possible gallbladder fossa abscess vs biloma.  Request has been made to IR for aspiration and possible  drain placement - patient has been reviewed by Dr. Laurence Ferrari who agrees to proceed with procedure today.  Patient has been NPO since midnight, Xarelto has been held since 4/15. Afebrile, WBC 7.9, hgb 10.4, plt 183, INR 1.1.  Risks and benefits discussed with the patient including bleeding, infection, damage to adjacent structures, bowel perforation/fistula connection, and sepsis.  All of the patient's questions were answered, patient is agreeable to proceed.  Consent signed and in chart.  Thank you for this interesting consult.  I greatly enjoyed meeting KAYSIN BROCK and look forward to participating in their care.  A copy of this report was sent to the requesting provider on this date.  Electronically Signed: Joaquim Nam, PA-C 11/21/2018, 10:11 AM   I spent a total of 40 Minutes  in face to face in clinical consultation, greater than 50% of which was counseling/coordinating care for gallbladder abscess/biloma aspiration/drain placement.

## 2018-11-21 NOTE — Progress Notes (Addendum)
[facility]  PROGRESS NOTE  ZAEEM KANDEL GGY:[id number] DOB: Apr 24, 1961 DOA: 11/18/2018 PCP: [name], [name], MD   Brief HPI:   58 year old male with a history of pancreatic cancer on chemotherapy, tobacco abuse, GERD, anxiety came with cough, shortness of breath fever and chills.  Patient's temp was 102.4.  Also was coughing up yellow-colored phlegm.  No contact with COVID-19 patients.    Subjective   Patient seen and examined, denies abdominal pain or nausea vomiting.  Abdominal ultrasound showed abscess versus biloma, general surgery and IR were consulted.  IR obtained CT abdomen pelvis done yesterday showed abscess versus biloma in the gallbladder fossa.  Plan for drainage of the abscess with drain placement.   Assessment/Plan:     1. Acute bronchitis-resolved,  chest x-ray was negative for infiltrate.  COVID-19 test came back negative.  Patient started on IV azithromycin and Rocephin.  LDH 154, lactic acid 1.2, procalcitonin 14.95.  We will continue with antibiotics at this time.C-reactive protein 23.5, d-dimer elevated 3.82, (Patient is on Xarelto) strep pneumo urinary antigen negative, urine Legionella,  antigen is pending, blood cultures x2 are pending.  2. Gram-negative rod bacteremia/abscess in the gallbladder fossa-on physical exam yesterday patient was found to have tenderness in right upper quadrant he recently had laparoscopic cholecystectomy 10 days ago.  Abdominal ultrasound was obtained which showed biloma versus abscess.  IR and general surgery were consulted.  CT abdomen without contrast confirmed abscess.  Plan for drain placement today per IR.  No surgical indication as per general surgery.  3. Adenocarcinoma of pancreas-diagnosed in November 2019, patient on chemotherapy.  Last dose was 10/16/2018.  Follow-up with Dr. Burr Medico as outpatient.    4. 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 Xarelto was held yesterday, per IR will  order heparin per pharmacy today.as per oncology if Xarelto is held more than 24 hours then heparin bridging is preferred due to high risk of thrombosis from metastatic pancreatic cancer and recurrent left [place] thrombosis  5. Hypokalemia-replete.  6. Transaminitis-patient has elevated liver enzymes likely from pancreatic cancer, AST 52, ALT 102, alk phos 206.  Follow liver enzymes in a.m.      CBC: Recent Labs  Lab 11/18/18 2300 11/19/18 0236 11/20/18 0910 11/21/18 0453  WBC 3.0*  --  8.5 7.9  NEUTROABS 2.8  --   --   --   HGB 11.9* 10.5* 10.5* 10.4*  HCT 36.9*  --  32.6* 32.2*  MCV 97.9  --  98.8 97.6  PLT 158  --  158 035    Basic Metabolic Panel: Recent Labs  Lab 11/18/18 2300 11/19/18 0548 11/19/18 1723 11/20/18 0910 11/21/18 0453  NA 133*  --  136 135 135  K 3.4*  --  3.3* 3.3* 3.7  CL 97*  --  107 105 104  CO2 25  --  21* 22 22  GLUCOSE 136*  --  115* 109* 116*  BUN 14  --  10 9 7   CREATININE 0.92  --  0.71 0.76 0.73  CALCIUM 8.3*  --  7.6* 8.2* 8.2*  MG  --  2.0  --   --   --      DVT prophylaxis: Xarelto  Code Status: Full code  Family Communication: No family at bedside  Disposition Plan: likely home when medically ready for discharge     Consultants:    Procedures:     Antibiotics:   Anti-infectives (From admission, onward)   Start  Dose/Rate Route Frequency Ordered Stop   11/20/18 1400  metroNIDAZOLE (FLAGYL) IVPB 500 mg     500 mg 100 mL/hr over 60 Minutes Intravenous Every 8 hours 11/20/18 1107     11/19/18 2300  azithromycin (ZITHROMAX) 500 mg in sodium chloride 0.9 % 250 mL IVPB  Status:  Discontinued     500 mg 250 mL/hr over 60 Minutes Intravenous Every 24 hours 11/19/18 0204 11/20/18 1107   11/19/18 2200  cefTRIAXone (ROCEPHIN) 1 g in sodium chloride 0.9 % 100 mL IVPB  Status:  Discontinued     1 g 200 mL/hr over 30 Minutes Intravenous Every 24 hours 11/19/18 0204 11/19/18 1719   11/19/18 1800  cefTRIAXone (ROCEPHIN) 2 g  in sodium chloride 0.9 % 100 mL IVPB     2 g 200 mL/hr over 30 Minutes Intravenous Every 24 hours 11/19/18 1719     11/18/18 2300  cefTRIAXone (ROCEPHIN) 1 g in sodium chloride 0.9 % 100 mL IVPB     1 g 200 mL/hr over 30 Minutes Intravenous  Once 11/18/18 2252 11/18/18 2348   11/18/18 2300  azithromycin (ZITHROMAX) 500 mg in sodium chloride 0.9 % 250 mL IVPB     500 mg 250 mL/hr over 60 Minutes Intravenous  Once 11/18/18 2252 11/19/18 0043       Objective   Vitals:   11/21/18 1340 11/21/18 1350 11/21/18 1355 11/21/18 1400  BP: 130/83 117/82 117/75 123/81  Pulse: 82 85 78 82  Resp: 18 (!) 21 (!) 21 18  Temp:      TempSrc:      SpO2: 98% 98% 97% 98%  Weight:      Height:        Intake/Output Summary (Last 24 hours) at 11/21/2018 1405 Last data filed at 11/21/2018 0500 Gross per 24 hour  Intake 1887.32 ml  Output -  Net 1887.32 ml   Filed Weights   11/18/18 2235 11/19/18 0520  Weight: 90.7 kg 89.4 kg     Physical Examination:    General: Appears in no acute distress  Cardiovascular: S1-S2, regular, no murmur auscultated  Respiratory: Clear to auscultation bilaterally  Abdomen: Abdomen is soft, tenderness to palpation right upper quadrant, mild guarding  Extremities: No edema of the lower extremities  Neurologic: Alert, oriented x3, no focal deficit noted     Data Reviewed: I have personally reviewed following labs and imaging studies   Recent Results (from the past 240 hour(s))  Blood Culture (routine x 2)     Status: None (Preliminary result)   Collection Time: 11/18/18 10:48 PM  Result Value Ref Range Status   Specimen Description   Final    BLOOD LEFT ANTECUBITAL Performed at [facility], [address], [place], [place] [address]    Special Requests   Final    BOTTLES DRAWN AEROBIC AND ANAEROBIC Blood Culture adequate volume Performed at [place] [facility] [address] [place], [place] [address]    Culture   Setup Time   Final    GRAM NEGATIVE RODS ANAEROBIC BOTTLE ONLY CRITICAL RESULT CALLED TO, READ BACK BY AND VERIFIED WITH: Shelda Jakes PharmD 17:10 11/19/18 ([email or web address])    Culture   Final    Lonell Grandchild NEGATIVE RODS IDENTIFICATION AND SUSCEPTIBILITIES TO FOLLOW Performed at [facility], [place] [address] [place], [place] [address]    Report Status PENDING  Incomplete  Blood Culture ID Panel (Reflexed)     Status: None   Collection Time: 11/18/18 10:48  PM  Result Value Ref Range Status   Enterococcus species NOT DETECTED NOT DETECTED Final   Listeria monocytogenes NOT DETECTED NOT DETECTED Final   Staphylococcus species NOT DETECTED NOT DETECTED Final   Staphylococcus aureus (BCID) NOT DETECTED NOT DETECTED Final   Streptococcus species NOT DETECTED NOT DETECTED Final   Streptococcus agalactiae NOT DETECTED NOT DETECTED Final   Streptococcus pneumoniae NOT DETECTED NOT DETECTED Final   Streptococcus pyogenes NOT DETECTED NOT DETECTED Final   Acinetobacter baumannii NOT DETECTED NOT DETECTED Final   Enterobacteriaceae species NOT DETECTED NOT DETECTED Final   Enterobacter cloacae complex NOT DETECTED NOT DETECTED Final   Escherichia coli NOT DETECTED NOT DETECTED Final   Klebsiella oxytoca NOT DETECTED NOT DETECTED Final   Klebsiella pneumoniae NOT DETECTED NOT DETECTED Final   Proteus species NOT DETECTED NOT DETECTED Final   Serratia marcescens NOT DETECTED NOT DETECTED Final   Haemophilus influenzae NOT DETECTED NOT DETECTED Final   Neisseria meningitidis NOT DETECTED NOT DETECTED Final   Pseudomonas aeruginosa NOT DETECTED NOT DETECTED Final   Candida albicans NOT DETECTED NOT DETECTED Final   Candida glabrata NOT DETECTED NOT DETECTED Final   Candida krusei NOT DETECTED NOT DETECTED Final   Candida parapsilosis NOT DETECTED NOT DETECTED Final   Candida tropicalis NOT DETECTED NOT DETECTED Final    Comment: Performed at [facility], [address], [place], [place] [address]   SARS Coronavirus 2 [facility] order, Performed in [facility] hospital lab)     Status: None   Collection Time: 11/18/18 11:12 PM  Result Value Ref Range Status   SARS Coronavirus 2 NEGATIVE NEGATIVE Final    Comment: (NOTE) If result is NEGATIVE SARS-CoV-2 target nucleic acids are NOT DETECTED. The SARS-CoV-2 RNA is generally detectable in upper and lower  respiratory specimens during the acute phase of infection. The lowest  concentration of SARS-CoV-2 viral copies this assay can detect is 250  copies / mL. A negative result does not preclude SARS-CoV-2 infection  and should not be used as the sole basis for treatment or other  patient management decisions.  A negative result may occur with  improper specimen collection / handling, submission of specimen other  than nasopharyngeal swab, presence of viral mutation(s) within the  areas targeted by this assay, and inadequate number of viral copies  (<250 copies / mL). A negative result must be combined with clinical  observations, patient history, and epidemiological information. If result is POSITIVE SARS-CoV-2 target nucleic acids are DETECTED. The SARS-CoV-2 RNA is generally detectable in upper and lower  respiratory specimens dur ing the acute phase of infection.  Positive  results are indicative of active infection with SARS-CoV-2.  Clinical  correlation with patient history and other diagnostic information is  necessary to determine patient infection status.  Positive results do  not rule out bacterial infection or co-infection with other viruses. If result is PRESUMPTIVE POSTIVE SARS-CoV-2 nucleic acids MAY BE PRESENT.   A presumptive positive result was obtained on the submitted specimen  and confirmed on repeat testing.  While 2019 novel coronavirus  (SARS-CoV-2) nucleic acids may be present in the submitted sample  additional confirmatory testing may be necessary for epidemiological  and / or clinical management purposes   to differentiate between  SARS-CoV-2 and other Sarbecovirus currently known to infect humans.  If clinically indicated additional testing with an alternate test  methodology [id number]) is advised. The SARS-CoV-2 RNA is generally  detectable in upper and lower respiratory sp ecimens  during the acute  phase of infection. The expected result is Negative. Fact Sheet for Patients:  StrictlyIdeas.no Fact Sheet for Healthcare Providers: BankingDealers.co.za This test is not yet approved or cleared by the [place] FDA and has been authorized for detection and/or diagnosis of SARS-CoV-2 by FDA under an Emergency Use Authorization (EUA).  This EUA will remain in effect (meaning this test can be used) for the duration of the COVID-19 declaration under Section 564(b)(1) of the Act, 21 U.S.C. section 360bbb-3(b)(1), unless the authorization is terminated or revoked sooner. Performed at [facility], [place] [address] [place], [place] [address]   Blood Culture (routine x 2)     Status: None (Preliminary result)   Collection Time: 11/18/18 11:26 PM  Result Value Ref Range Status   Specimen Description   Final    BLOOD BLOOD LEFT FOREARM Performed at [facility] [address] [place], [place] [address]    Special Requests   Final    BOTTLES DRAWN AEROBIC AND ANAEROBIC Blood Culture adequate volume Performed at [place] [address], [place], [place] [address]    Culture   Final    NO GROWTH 2 DAYS Performed at [place] [address] [place], [place] [address]    Report Status PENDING  Incomplete  Respiratory Panel by PCR     Status: None   Collection Time: 11/19/18  2:36 AM  Result Value Ref Range Status   Adenovirus NOT DETECTED NOT DETECTED Final   Coronavirus 229E NOT DETECTED NOT DETECTED Final    Comment: (NOTE) The Coronavirus on the Respiratory Panel, DOES NOT test for  the novel  Coronavirus (2019 nCoV)    Coronavirus HKU1 NOT DETECTED NOT DETECTED Final   Coronavirus NL63 NOT DETECTED NOT DETECTED Final   Coronavirus OC43 NOT DETECTED NOT DETECTED Final   Metapneumovirus NOT DETECTED NOT DETECTED Final   Rhinovirus / Enterovirus NOT DETECTED NOT DETECTED Final   Influenza A NOT DETECTED NOT DETECTED Final   Influenza B NOT DETECTED NOT DETECTED Final   Parainfluenza Virus 1 NOT DETECTED NOT DETECTED Final   Parainfluenza Virus 2 NOT DETECTED NOT DETECTED Final   Parainfluenza Virus 3 NOT DETECTED NOT DETECTED Final   Parainfluenza Virus 4 NOT DETECTED NOT DETECTED Final   Respiratory Syncytial Virus NOT DETECTED NOT DETECTED Final   Bordetella pertussis NOT DETECTED NOT DETECTED Final   Chlamydophila pneumoniae NOT DETECTED NOT DETECTED Final   Mycoplasma pneumoniae NOT DETECTED NOT DETECTED Final    Comment: Performed at [facility], [address] [place], [place] [address]  Culture, sputum-assessment     Status: None   Collection Time: 11/20/18  7:40 PM  Result Value Ref Range Status   Specimen Description SPUTUM  Final   Special Requests NONE  Final   Sputum evaluation   Final    THIS SPECIMEN IS ACCEPTABLE FOR SPUTUM CULTURE Performed at [facility], [place] [address], [place], [place] [address]    Report Status 11/20/2018 FINAL  Final  Culture, respiratory     Status: None (Preliminary result)   Collection Time: 11/20/18  7:40 PM  Result Value Ref Range Status   Specimen Description   Final    SPUTUM Performed at [place] [address] [place], [place] [address]    Special Requests   Final    NONE Reflexed from [id number] Performed at [facility], [address] [place], [place] [address]    Gram Stain   Final    MODERATE WBC PRESENT, PREDOMINANTLY PMN MODERATE GRAM POSITIVE RODS    Culture   Final    TOO YOUNG TO READ Performed at [facility], [place] [address], [place], [place] [address]    Report Status PENDING  Incomplete     Liver Function Tests: Recent Labs  Lab 11/18/18 2300 11/19/18 1723 11/20/18 0910  AST 52* 46* 53*  ALT 102* 84* 85*  ALKPHOS 206* 157* 157*  BILITOT 1.0 0.2* 0.7  PROT 7.2 6.3* 6.5  ALBUMIN 3.0* 2.6* 2.8*   No results for input(s): LIPASE, AMYLASE in the last 168 hours. No results for input(s): AMMONIA in the last 168 hours.  Cardiac Enzymes: Recent Labs  Lab 11/19/18 0236 11/19/18 0548 11/19/18 1048 11/19/18 1723  TROPONINI 0.10* 0.06* 0.05* 0.05*   BNP (last 3 results) Recent Labs    11/18/18 2248  BNP 106.2*    ProBNP (last 3 results) No results for input(s): PROBNP in the last 8760 hours.    Studies: Ct Abdomen Wo Contrast  Result Date: 11/20/2018 CLINICAL DATA:  Abdominal pain status post cholecystectomy. EXAM: CT ABDOMEN WITHOUT CONTRAST TECHNIQUE: Multidetector CT imaging of the abdomen was performed following the standard protocol without IV contrast. COMPARISON:  Ultrasound of same day.  CT scan of June 07, 2018. FINDINGS: Lower chest: No acute abnormality. Hepatobiliary: Status post cholecystectomy. There is now noted 9.4 x 8.4 cm air-fluid collection with surrounding inflammation seen in the gallbladder fossa concerning for possible abscess or biloma. Pneumobilia is noted in left hepatic lobe. No definite intrahepatic biliary dilatation is noted. Pancreas: Large stent is seen in distal common bile duct extending into duodenum for treatment of pancreatic head mass noted on prior studies. Spleen: Normal in size without focal abnormality. Adrenals/Urinary Tract: Adrenal glands and kidneys appear normal. No hydronephrosis or renal obstruction is noted. No renal calculi are noted. Stomach/Bowel: The stomach appears normal. There is no evidence of bowel obstruction. Vascular/Lymphatic: Aortic atherosclerosis. No enlarged abdominal lymph nodes. Other: No abdominal wall hernia or abnormality.  Musculoskeletal: No acute or significant osseous findings. IMPRESSION: 9.4 x 8.4 cm air-fluid collection is noted in gallbladder fossa status post cholecystectomy, concerning for possible abscess or biloma. These results will be called to the ordering clinician or representative by the Radiologist Assistant, and communication documented in the PACS or zVision Dashboard. Large stent is noted in distal common bile duct extending into duodenum for treatment of pancreatic head mass. Electronically Signed   By: Marijo Conception M.D.   On: 11/20/2018 15:58   US Abdomen Complete  Result Date: 11/20/2018 CLINICAL DATA:  Right upper quadrant abdominal pain. EXAM: ABDOMEN ULTRASOUND COMPLETE COMPARISON:  MRI of October 24, 2018. FINDINGS: Gallbladder: Status post cholecystectomy. Complex rounded abnormality measuring 9.8 x 7.8 cm is seen in the gallbladder fossa which may represent hematoma, biloma or possible abscess. Common bile duct: Diameter: 4 mm which is within normal limits. Liver: No focal lesion identified. Within normal limits in parenchymal echogenicity. Portal vein is patent on color Doppler imaging with normal direction of blood flow towards the liver. IVC: No abnormality visualized. Pancreas: Irregular hypoechoic abnormality measuring 4.0 x 3.5 x 1.8 cm is seen in the pancreatic head consistent with a history of pancreatic malignancy. No ductal dilatation is noted. Spleen: Spleen measures 12.4 x 12.8 x 6.5 cm with calculated volume of 530 cubic cm, consistent with mild splenomegaly. No focal abnormality is  noted. Right Kidney: Length: 12.7 cm. Echogenicity within normal limits. No mass or hydronephrosis visualized. Left Kidney: Length: 12.9 cm. Echogenicity within normal limits. No mass or hydronephrosis visualized. Abdominal aorta: No aneurysm visualized. Other findings: None. IMPRESSION: Complex rounded abnormality seen in gallbladder fossa status post recent cholecystectomy. This is concerning for possible  biloma, abscess or hematoma. Further evaluation with CT scan of the abdomen is recommended. These results will be called to the ordering clinician or representative by the Radiologist Assistant, and communication documented in the PACS or zVision Dashboard. 4.0 cm irregular hypoechoic abnormality seen in pancreatic head consistent with history of pancreatic neoplasm. Mild splenomegaly. Electronically Signed   By: Marijo Conception M.D.   On: 11/20/2018 13:32   Nm Hepato Biliary Leak  Result Date: 11/20/2018 CLINICAL DATA:  Cholecystectomy on 11/10/2018. Mid to low back pain for 6 days with fever. EXAM: NUCLEAR MEDICINE HEPATOBILIARY IMAGING TECHNIQUE: Sequential images of the abdomen were obtained out to 60 minutes following intravenous administration of radiopharmaceutical. RADIOPHARMACEUTICALS:  5.3 mCi Tc-85m Choletec IV COMPARISON:  CT scan from earlier today. FINDINGS: Brisk uptake of radiotracer by the hepatic parenchyma noted with clearance of blood pool activity by 5-10 minutes. Biliary activity is visible by 10 minutes and gut activity is visible by 10-15 minutes. No accumulation of radiotracer in the gallbladder fossa fluid collection seen on previous CT. No accumulation of radiotracer along the surface of the liver. No evidence for pooling of radiotracer in the peritoneal cavity. IMPRESSION: 1. Normal hepatobiliary patency study. 2. No evidence for bile leak. Electronically Signed   By: EMisty StanleyM.D.   On: 11/20/2018 18:13    Scheduled Meds: . aspirin EC  81 mg Oral Daily  . fentaNYL      . folic acid  1 mg Oral QPC breakfast  . loratadine  10 mg Oral QPC breakfast  . midazolam      . nicotine  21 mg Transdermal Daily  . pantoprazole  40 mg Oral Daily  . [START ON 11/22/2018] rivaroxaban  20 mg Oral QPC breakfast    Admission status: Inpatient: Based on patients clinical presentation and evaluation of above clinical data, I have made determination that patient meets Inpatient criteria  at this time.  Time spent: 20 min  [facility] Pager [phone] If 7PM-7AM, please contact night-coverage at [email or web address], Office  [phone] password [email or web address]  11/21/2018, 2:05 PM  LOS: 2 days

## 2018-11-21 NOTE — Procedures (Signed)
Interventional Radiology Procedure Note  Procedure: CT guided placement of a 27F drain into the large fluid and gas collection in the GB fossa.  Aspiration yields 380 mL foul smelling purulent fluid.   Complications: None  Estimated Blood Loss: None  Recommendations: - Drain to [place] to remove drain once output has remained absent/minimal for 72 hours  Signed,  Criselda Peaches, MD

## 2018-11-21 NOTE — Progress Notes (Signed)
Subjective/Chief Complaint: Some nausea, no bm, pain ruq   Objective: Vital signs in last 24 hours: Temp:  [97.8 F (36.6 C)-101.1 F (38.4 C)] 101.1 F (38.4 C) (04/17 0547) Pulse Rate:  [68-93] 92 (04/17 0547) Resp:  [16-22] 16 (04/17 0547) BP: (114-125)/(75-82) 125/81 (04/17 0547) SpO2:  [96 %-99 %] 97 % (04/17 0547) Last BM Date: 11/19/18  Intake/Output from previous day: 04/16 0701 - 04/17 0700 In: 1887.3 [I.V.:1494.7; IV Piggyback:392.7] Out: -  Intake/Output this shift: No intake/output data recorded.  GI: incisions clean without infection, soft nondistended mild tender  Lab Results:  Recent Labs    11/20/18 0910 11/21/18 0453  WBC 8.5 7.9  HGB 10.5* 10.4*  HCT 32.6* 32.2*  PLT 158 183   BMET Recent Labs    11/20/18 0910 11/21/18 0453  NA 135 135  K 3.3* 3.7  CL 105 104  CO2 22 22  GLUCOSE 109* 116*  BUN 9 7  CREATININE 0.76 0.73  CALCIUM 8.2* 8.2*   PT/INR Recent Labs    11/21/18 0453  LABPROT 13.8  INR 1.1   ABG Recent Labs    11/19/18 0130  PHART 7.437  HCO3 23.5    Studies/Results: Ct Abdomen Wo Contrast  Result Date: 11/20/2018 CLINICAL DATA:  Abdominal pain status post cholecystectomy. EXAM: CT ABDOMEN WITHOUT CONTRAST TECHNIQUE: Multidetector CT imaging of the abdomen was performed following the standard protocol without IV contrast. COMPARISON:  Ultrasound of same day.  CT scan of June 07, 2018. FINDINGS: Lower chest: No acute abnormality. Hepatobiliary: Status post cholecystectomy. There is now noted 9.4 x 8.4 cm air-fluid collection with surrounding inflammation seen in the gallbladder fossa concerning for possible abscess or biloma. Pneumobilia is noted in left hepatic lobe. No definite intrahepatic biliary dilatation is noted. Pancreas: Large stent is seen in distal common bile duct extending into duodenum for treatment of pancreatic head mass noted on prior studies. Spleen: Normal in size without focal abnormality.  Adrenals/Urinary Tract: Adrenal glands and kidneys appear normal. No hydronephrosis or renal obstruction is noted. No renal calculi are noted. Stomach/Bowel: The stomach appears normal. There is no evidence of bowel obstruction. Vascular/Lymphatic: Aortic atherosclerosis. No enlarged abdominal lymph nodes. Other: No abdominal wall hernia or abnormality. Musculoskeletal: No acute or significant osseous findings. IMPRESSION: 9.4 x 8.4 cm air-fluid collection is noted in gallbladder fossa status post cholecystectomy, concerning for possible abscess or biloma. These results will be called to the ordering clinician or representative by the Radiologist Assistant, and communication documented in the PACS or zVision Dashboard. Large stent is noted in distal common bile duct extending into duodenum for treatment of pancreatic head mass. Electronically Signed   By: Marijo Conception M.D.   On: 11/20/2018 15:58   US Abdomen Complete  Result Date: 11/20/2018 CLINICAL DATA:  Right upper quadrant abdominal pain. EXAM: ABDOMEN ULTRASOUND COMPLETE COMPARISON:  MRI of October 24, 2018. FINDINGS: Gallbladder: Status post cholecystectomy. Complex rounded abnormality measuring 9.8 x 7.8 cm is seen in the gallbladder fossa which may represent hematoma, biloma or possible abscess. Common bile duct: Diameter: 4 mm which is within normal limits. Liver: No focal lesion identified. Within normal limits in parenchymal echogenicity. Portal vein is patent on color Doppler imaging with normal direction of blood flow towards the liver. IVC: No abnormality visualized. Pancreas: Irregular hypoechoic abnormality measuring 4.0 x 3.5 x 1.8 cm is seen in the pancreatic head consistent with a history of pancreatic malignancy. No ductal dilatation is noted. Spleen: Spleen measures 12.4  x 12.8 x 6.5 cm with calculated volume of 530 cubic cm, consistent with mild splenomegaly. No focal abnormality is noted. Right Kidney: Length: 12.7 cm. Echogenicity  within normal limits. No mass or hydronephrosis visualized. Left Kidney: Length: 12.9 cm. Echogenicity within normal limits. No mass or hydronephrosis visualized. Abdominal aorta: No aneurysm visualized. Other findings: None. IMPRESSION: Complex rounded abnormality seen in gallbladder fossa status post recent cholecystectomy. This is concerning for possible biloma, abscess or hematoma. Further evaluation with CT scan of the abdomen is recommended. These results will be called to the ordering clinician or representative by the Radiologist Assistant, and communication documented in the PACS or zVision Dashboard. 4.0 cm irregular hypoechoic abnormality seen in pancreatic head consistent with history of pancreatic neoplasm. Mild splenomegaly. Electronically Signed   By: Marijo Conception M.D.   On: 11/20/2018 13:32   Nm Hepato Biliary Leak  Result Date: 11/20/2018 CLINICAL DATA:  Cholecystectomy on 11/10/2018. Mid to low back pain for 6 days with fever. EXAM: NUCLEAR MEDICINE HEPATOBILIARY IMAGING TECHNIQUE: Sequential images of the abdomen were obtained out to 60 minutes following intravenous administration of radiopharmaceutical. RADIOPHARMACEUTICALS:  5.3 mCi Tc-8m  Choletec IV COMPARISON:  CT scan from earlier today. FINDINGS: Brisk uptake of radiotracer by the hepatic parenchyma noted with clearance of blood pool activity by 5-10 minutes. Biliary activity is visible by 10 minutes and gut activity is visible by 10-15 minutes. No accumulation of radiotracer in the gallbladder fossa fluid collection seen on previous CT. No accumulation of radiotracer along the surface of the liver. No evidence for pooling of radiotracer in the peritoneal cavity. IMPRESSION: 1. Normal hepatobiliary patency study. 2. No evidence for bile leak. Electronically Signed   By: Misty Stanley M.D.   On: 11/20/2018 18:13    Anti-infectives: Anti-infectives (From admission, onward)   Start     Dose/Rate Route Frequency Ordered Stop    11/20/18 1400  metroNIDAZOLE (FLAGYL) IVPB 500 mg     500 mg 100 mL/hr over 60 Minutes Intravenous Every 8 hours 11/20/18 1107     11/19/18 2300  azithromycin (ZITHROMAX) 500 mg in sodium chloride 0.9 % 250 mL IVPB  Status:  Discontinued     500 mg 250 mL/hr over 60 Minutes Intravenous Every 24 hours 11/19/18 0204 11/20/18 1107   11/19/18 2200  cefTRIAXone (ROCEPHIN) 1 g in sodium chloride 0.9 % 100 mL IVPB  Status:  Discontinued     1 g 200 mL/hr over 30 Minutes Intravenous Every 24 hours 11/19/18 0204 11/19/18 1719   11/19/18 1800  cefTRIAXone (ROCEPHIN) 2 g in sodium chloride 0.9 % 100 mL IVPB     2 g 200 mL/hr over 30 Minutes Intravenous Every 24 hours 11/19/18 1719     11/18/18 2300  cefTRIAXone (ROCEPHIN) 1 g in sodium chloride 0.9 % 100 mL IVPB     1 g 200 mL/hr over 30 Minutes Intravenous  Once 11/18/18 2252 11/18/18 2348   11/18/18 2300  azithromycin (ZITHROMAX) 500 mg in sodium chloride 0.9 % 250 mL IVPB     500 mg 250 mL/hr over 60 Minutes Intravenous  Once 11/18/18 2252 11/19/18 0043      Assessment/Plan: S/p dx lsc for stage IV panc ca, cholecystectomy -I discussed findings on ct, [name] and hida scan with him -on abx for gnr bacteremia -IR has seen- plan for drainage of likely ruq abscess today -from surgical standpoint can eat after that -will f/u Dr Orvilla Cornwall 11/21/2018

## 2018-11-22 LAB — CBC
HCT: 36.3 % — ABNORMAL LOW (ref 39.0–52.0)
Hemoglobin: 11.5 g/dL — ABNORMAL LOW (ref 13.0–17.0)
MCH: 30.7 pg (ref 26.0–34.0)
MCHC: 31.7 g/dL (ref 30.0–36.0)
MCV: 96.8 fL (ref 80.0–100.0)
Platelets: 226 10*3/uL (ref 150–400)
RBC: 3.75 MIL/uL — ABNORMAL LOW (ref 4.22–5.81)
RDW: 15 % (ref 11.5–15.5)
WBC: 8.9 10*3/uL (ref 4.0–10.5)
nRBC: 0 % (ref 0.0–0.2)

## 2018-11-22 LAB — HEPARIN LEVEL (UNFRACTIONATED)
Heparin Unfractionated: <0.1 IU/mL — ABNORMAL LOW (ref 0.30–0.70)
Heparin Unfractionated: <0.1 IU/mL — ABNORMAL LOW (ref 0.30–0.70)

## 2018-11-22 LAB — CULTURE, BLOOD (ROUTINE X 2): Special Requests: ADEQUATE

## 2018-11-22 MED ORDER — HEPARIN BOLUS VIA INFUSION
2000.0000 [IU] | Freq: Once | INTRAVENOUS | Status: AC
  Administered 2018-11-22: 2000 [IU] via INTRAVENOUS
  Filled 2018-11-22: qty 2000

## 2018-11-22 MED ORDER — RIVAROXABAN 20 MG PO TABS: 20.0000 mg | ORAL_TABLET | Freq: Every day | ORAL | Status: DC

## 2018-11-22 MED ORDER — METRONIDAZOLE 500 MG PO TABS
500.0000 mg | ORAL_TABLET | Freq: Three times a day (TID) | ORAL | Status: DC
  Administered 2018-11-22 – 2018-11-24 (×7): 500 mg via ORAL
  Filled 2018-11-22 (×7): qty 1

## 2018-11-22 MED ORDER — HEPARIN (PORCINE) 25000 UT/250ML-% IV SOLN
1600.0000 [IU]/h | INTRAVENOUS | Status: DC
  Administered 2018-11-22 (×2): 1600 [IU]/h via INTRAVENOUS
  Filled 2018-11-22: qty 250

## 2018-11-22 MED ORDER — RIVAROXABAN 20 MG PO TABS
20.0000 mg | ORAL_TABLET | Freq: Every day | ORAL | Status: DC
  Administered 2018-11-22 – 2018-11-24 (×3): 20 mg via ORAL
  Filled 2018-11-22 (×3): qty 1

## 2018-11-22 NOTE — Progress Notes (Signed)
[facility]  PROGRESS NOTE  Ernest Wu QTM:[id number] DOB: 10/16/1960 DOA: 11/18/2018 PCP: [name], [name], MD   Brief HPI:   58 year old male with a history of pancreatic cancer on chemotherapy, tobacco abuse, GERD, anxiety came with cough, shortness of breath fever and chills.  Patient's temp was 102.4.  Also was coughing up yellow-colored phlegm.  No contact with COVID-19 patients.    Subjective   Patient seen and examined, complains of some tightness in right upper quadrant.  Denies any nausea or vomiting.  JP drain in place.   Assessment/Plan:     1. Acute bronchitis-resolved,  chest x-ray was negative for infiltrate.  COVID-19 test came back negative.  Patient started on IV azithromycin and Rocephin.  LDH 154, lactic acid 1.2, procalcitonin 14.95.  We will continue with antibiotics at this time.C-reactive protein 23.5, d-dimer elevated 3.82, (Patient is on Xarelto) strep pneumo urinary antigen negative, urine Legionella  antigen is negative.  2. Gram-negative rod bacteremia/abscess in the gallbladder fossa-blood culture grew Hafnia species, sensitive to ceftriaxone.  Physical exam  revealed patient was found to have tenderness in right upper quadrant, he recently had laparoscopic cholecystectomy 10 days ago.  Abdominal ultrasound was obtained which showed biloma versus abscess.  IR and general surgery were consulted.  CT abdomen without contrast confirmed abscess.  Patient underwent drainage of the abscess along with placement of JP drain yesterday.  Continue ceftriaxone, Flagyl. Abscess culture is currently pending.   3. Adenocarcinoma of pancreas-diagnosed in November 2019, patient on chemotherapy.  Last dose was 10/16/2018.  Follow-up with Dr. Burr Medico as outpatient.    4. 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 Xarelto was held yesterday, per IR will order heparin per pharmacy today.as per oncology if Xarelto is held more than 24 hours  then heparin bridging is preferred due to high risk of thrombosis from metastatic pancreatic cancer and recurrent left Briar thrombosis.  Heparin was started yesterday, will restart Xarelto from today.  5. Hypokalemia-replete.  6. Transaminitis-patient has elevated liver enzymes likely from pancreatic cancer, AST 52, ALT 102, alk phos 206.  Follow liver enzymes in a.m.      CBC: Recent Labs  Lab 11/18/18 2300 11/19/18 0236 11/20/18 0910 11/21/18 0453 11/22/18 0841  WBC 3.0*  --  8.5 7.9 8.9  NEUTROABS 2.8  --   --   --   --   HGB 11.9* 10.5* 10.5* 10.4* 11.5*  HCT 36.9*  --  32.6* 32.2* 36.3*  MCV 97.9  --  98.8 97.6 96.8  PLT 158  --  158 183 625    Basic Metabolic Panel: Recent Labs  Lab 11/18/18 2300 11/19/18 0548 11/19/18 1723 11/20/18 0910 11/21/18 0453  NA 133*  --  136 135 135  K 3.4*  --  3.3* 3.3* 3.7  CL 97*  --  107 105 104  CO2 25  --  21* 22 22  GLUCOSE 136*  --  115* 109* 116*  BUN 14  --  10 9 7   CREATININE 0.92  --  0.71 0.76 0.73  CALCIUM 8.3*  --  7.6* 8.2* 8.2*  MG  --  2.0  --   --   --      DVT prophylaxis: Xarelto  Code Status: Full code  Family Communication: No family at bedside  Disposition Plan: likely home when medically ready for discharge     Consultants:    Procedures:     Antibiotics:   Anti-infectives (From  admission, onward)   Start     Dose/Rate Route Frequency Ordered Stop   11/20/18 1400  metroNIDAZOLE (FLAGYL) IVPB 500 mg     500 mg 100 mL/hr over 60 Minutes Intravenous Every 8 hours 11/20/18 1107     11/19/18 2300  azithromycin (ZITHROMAX) 500 mg in sodium chloride 0.9 % 250 mL IVPB  Status:  Discontinued     500 mg 250 mL/hr over 60 Minutes Intravenous Every 24 hours 11/19/18 0204 11/20/18 1107   11/19/18 2200  cefTRIAXone (ROCEPHIN) 1 g in sodium chloride 0.9 % 100 mL IVPB  Status:  Discontinued     1 g 200 mL/hr over 30 Minutes Intravenous Every 24 hours 11/19/18 0204 11/19/18 1719   11/19/18 1800   cefTRIAXone (ROCEPHIN) 2 g in sodium chloride 0.9 % 100 mL IVPB     2 g 200 mL/hr over 30 Minutes Intravenous Every 24 hours 11/19/18 1719     11/18/18 2300  cefTRIAXone (ROCEPHIN) 1 g in sodium chloride 0.9 % 100 mL IVPB     1 g 200 mL/hr over 30 Minutes Intravenous  Once 11/18/18 2252 11/18/18 2348   11/18/18 2300  azithromycin (ZITHROMAX) 500 mg in sodium chloride 0.9 % 250 mL IVPB     500 mg 250 mL/hr over 60 Minutes Intravenous  Once 11/18/18 2252 11/19/18 0043       Objective   Vitals:   11/21/18 1438 11/21/18 1503 11/21/18 2100 11/22/18 0534  BP: (!) 142/86 131/85 114/77 115/76  Pulse: 83 88 (!) 110 84  Resp: (!) 27 16 19 18   Temp: 98.6 F (37 C) 98.8 F (37.1 C) 98 F (36.7 C) 98.4 F (36.9 C)  TempSrc: Oral Oral Oral Oral  SpO2: 97% 96% 95% 95%  Weight:      Height:        Intake/Output Summary (Last 24 hours) at 11/22/2018 1035 Last data filed at 11/22/2018 0535 Gross per 24 hour  Intake 2048 ml  Output 1340 ml  Net 708 ml   Filed Weights   11/18/18 2235 11/19/18 0520  Weight: 90.7 kg 89.4 kg     Physical Examination:   General: Appears in no acute distress  Cardiovascular: S1-S2, regular, no murmur auscultated  Respiratory: Clear to auscultation bilaterally  Abdomen: Right upper quadrant tenderness, mild guarding.  No organomegaly.  Extremities: No edema in the lower extremities  Neurologic: Alert, oriented x3, no focal deficit noted.     Data Reviewed: I have personally reviewed following labs and imaging studies   Recent Results (from the past 240 hour(s))  Blood Culture (routine x 2)     Status: Abnormal   Collection Time: 11/18/18 10:48 PM  Result Value Ref Range Status   Specimen Description   Final    BLOOD LEFT ANTECUBITAL Performed at [facility] [address], [place], [place] [address]    Special Requests   Final    BOTTLES DRAWN AEROBIC AND ANAEROBIC Blood Culture adequate volume Performed at [facility] [address], [place], [place] [address]    Culture  Setup Time   Final    GRAM NEGATIVE RODS ANAEROBIC BOTTLE ONLY CRITICAL RESULT CALLED TO, READ BACK BY AND VERIFIED WITH: Shelda Jakes PharmD 17:10 11/19/18 ([email or web address])    Culture HAFNIA SPECIES (A)  Final   Report Status 11/22/2018 FINAL  Final   Organism ID, Bacteria HAFNIA SPECIES  Final      Susceptibility   Hafnia species - MIC*  AMPICILLIN RESISTANT Resistant     CEFAZOLIN >=64 RESISTANT Resistant     CEFEPIME <=1 SENSITIVE Sensitive     CEFTAZIDIME 2 SENSITIVE Sensitive     CEFTRIAXONE <=1 SENSITIVE Sensitive     CIPROFLOXACIN <=0.25 SENSITIVE Sensitive     GENTAMICIN <=1 SENSITIVE Sensitive     IMIPENEM 0.5 SENSITIVE Sensitive     TRIMETH/SULFA <=20 SENSITIVE Sensitive     AMPICILLIN/SULBACTAM >=32 RESISTANT Resistant     PIP/TAZO 64 INTERMEDIATE Intermediate     * HAFNIA SPECIES  Blood Culture ID Panel (Reflexed)     Status: None   Collection Time: 11/18/18 10:48 PM  Result Value Ref Range Status   Enterococcus species NOT DETECTED NOT DETECTED Final   Listeria monocytogenes NOT DETECTED NOT DETECTED Final   Staphylococcus species NOT DETECTED NOT DETECTED Final   Staphylococcus aureus (BCID) NOT DETECTED NOT DETECTED Final   Streptococcus species NOT DETECTED NOT DETECTED Final   Streptococcus agalactiae NOT DETECTED NOT DETECTED Final   Streptococcus pneumoniae NOT DETECTED NOT DETECTED Final   Streptococcus pyogenes NOT DETECTED NOT DETECTED Final   Acinetobacter baumannii NOT DETECTED NOT DETECTED Final   Enterobacteriaceae species NOT DETECTED NOT DETECTED Final   Enterobacter cloacae complex NOT DETECTED NOT DETECTED Final   Escherichia coli NOT DETECTED NOT DETECTED Final   Klebsiella oxytoca NOT DETECTED NOT DETECTED Final   Klebsiella pneumoniae NOT DETECTED NOT DETECTED Final   Proteus species NOT DETECTED NOT DETECTED Final   Serratia marcescens NOT DETECTED NOT DETECTED Final    Haemophilus influenzae NOT DETECTED NOT DETECTED Final   Neisseria meningitidis NOT DETECTED NOT DETECTED Final   Pseudomonas aeruginosa NOT DETECTED NOT DETECTED Final   Candida albicans NOT DETECTED NOT DETECTED Final   Candida glabrata NOT DETECTED NOT DETECTED Final   Candida krusei NOT DETECTED NOT DETECTED Final   Candida parapsilosis NOT DETECTED NOT DETECTED Final   Candida tropicalis NOT DETECTED NOT DETECTED Final    Comment: Performed at [facility], [address] [place], [place] [address]  SARS Coronavirus 2 [facility] order, Performed in [facility] hospital lab)     Status: None   Collection Time: 11/18/18 11:12 PM  Result Value Ref Range Status   SARS Coronavirus 2 NEGATIVE NEGATIVE Final    Comment: (NOTE) If result is NEGATIVE SARS-CoV-2 target nucleic acids are NOT DETECTED. The SARS-CoV-2 RNA is generally detectable in upper and lower  respiratory specimens during the acute phase of infection. The lowest  concentration of SARS-CoV-2 viral copies this assay can detect is 250  copies / mL. A negative result does not preclude SARS-CoV-2 infection  and should not be used as the sole basis for treatment or other  patient management decisions.  A negative result may occur with  improper specimen collection / handling, submission of specimen other  than nasopharyngeal swab, presence of viral mutation(s) within the  areas targeted by this assay, and inadequate number of viral copies  (<250 copies / mL). A negative result must be combined with clinical  observations, patient history, and epidemiological information. If result is POSITIVE SARS-CoV-2 target nucleic acids are DETECTED. The SARS-CoV-2 RNA is generally detectable in upper and lower  respiratory specimens dur ing the acute phase of infection.  Positive  results are indicative of active infection with SARS-CoV-2.  Clinical  correlation with patient history and other diagnostic information is  necessary  to determine patient infection status.  Positive results do  not rule out bacterial infection or  co-infection with other viruses. If result is PRESUMPTIVE POSTIVE SARS-CoV-2 nucleic acids MAY BE PRESENT.   A presumptive positive result was obtained on the submitted specimen  and confirmed on repeat testing.  While 2019 novel coronavirus  (SARS-CoV-2) nucleic acids may be present in the submitted sample  additional confirmatory testing may be necessary for epidemiological  and / or clinical management purposes  to differentiate between  SARS-CoV-2 and other Sarbecovirus currently known to infect humans.  If clinically indicated additional testing with an alternate test  methodology [phone]) is advised. The SARS-CoV-2 RNA is generally  detectable in upper and lower respiratory sp ecimens during the acute  phase of infection. The expected result is Negative. Fact Sheet for Patients:  StrictlyIdeas.no Fact Sheet for Healthcare Providers: BankingDealers.co.za This test is not yet approved or cleared by the [place] FDA and has been authorized for detection and/or diagnosis of SARS-CoV-2 by FDA under an Emergency Use Authorization (EUA).  This EUA will remain in effect (meaning this test can be used) for the duration of the COVID-19 declaration under Section 564(b)(1) of the Act, 21 U.S.C. section 360bbb-3(b)(1), unless the authorization is terminated or revoked sooner. Performed at [facility], [place] [address] [place], [place] [address]   Blood Culture (routine x 2)     Status: None (Preliminary result)   Collection Time: 11/18/18 11:26 PM  Result Value Ref Range Status   Specimen Description   Final    BLOOD BLOOD LEFT FOREARM Performed at [facility] [address] [place], [place] [address]    Special Requests   Final    BOTTLES DRAWN AEROBIC AND ANAEROBIC Blood Culture adequate volume Performed at  [facility] [address] [place], [place] [address]    Culture   Final    NO GROWTH 3 DAYS Performed at [facility], [place] [address], [place], [place] [address]    Report Status PENDING  Incomplete  Respiratory Panel by PCR     Status: None   Collection Time: 11/19/18  2:36 AM  Result Value Ref Range Status   Adenovirus NOT DETECTED NOT DETECTED Final   Coronavirus 229E NOT DETECTED NOT DETECTED Final    Comment: (NOTE) The Coronavirus on the Respiratory Panel, DOES NOT test for the novel  Coronavirus (2019 nCoV)    Coronavirus HKU1 NOT DETECTED NOT DETECTED Final   Coronavirus NL63 NOT DETECTED NOT DETECTED Final   Coronavirus OC43 NOT DETECTED NOT DETECTED Final   Metapneumovirus NOT DETECTED NOT DETECTED Final   Rhinovirus / Enterovirus NOT DETECTED NOT DETECTED Final   Influenza A NOT DETECTED NOT DETECTED Final   Influenza B NOT DETECTED NOT DETECTED Final   Parainfluenza Virus 1 NOT DETECTED NOT DETECTED Final   Parainfluenza Virus 2 NOT DETECTED NOT DETECTED Final   Parainfluenza Virus 3 NOT DETECTED NOT DETECTED Final   Parainfluenza Virus 4 NOT DETECTED NOT DETECTED Final   Respiratory Syncytial Virus NOT DETECTED NOT DETECTED Final   Bordetella pertussis NOT DETECTED NOT DETECTED Final   Chlamydophila pneumoniae NOT DETECTED NOT DETECTED Final   Mycoplasma pneumoniae NOT DETECTED NOT DETECTED Final    Comment: Performed at [facility], [address], [place], [place] [address]  Culture, sputum-assessment     Status: None   Collection Time: 11/20/18  7:40 PM  Result Value Ref Range Status   Specimen Description SPUTUM  Final   Special Requests NONE  Final   Sputum evaluation  Final    THIS SPECIMEN IS ACCEPTABLE FOR SPUTUM CULTURE Performed at [facility] [address], [place], [place] [address]    Report Status 11/20/2018 FINAL  Final  Culture, respiratory     Status: None (Preliminary result)    Collection Time: 11/20/18  7:40 PM  Result Value Ref Range Status   Specimen Description   Final    SPUTUM Performed at [facility] [address] [place], [place] [address]    Special Requests   Final    NONE Reflexed from [phone] Performed at [facility], [place] [address] [place], [place] [address]    Gram Stain   Final    MODERATE WBC PRESENT, PREDOMINANTLY PMN MODERATE GRAM POSITIVE RODS    Culture   Final    TOO YOUNG TO READ Performed at [facility], [place] [address], [place], [place] [address]    Report Status PENDING  Incomplete  Aerobic/Anaerobic Culture (surgical/deep wound)     Status: None (Preliminary result)   Collection Time: 11/21/18  2:33 PM  Result Value Ref Range Status   Specimen Description   Final    ABSCESS ABDOMEN Performed at [facility] [address] [place], [place] [address]    Special Requests   Final    Normal Performed at [facility], [place] [address] [place], [place] [address]    Gram Stain   Final    ABUNDANT WBC PRESENT,BOTH PMN AND MONONUCLEAR ABUNDANT GRAM VARIABLE ROD MODERATE GRAM POSITIVE COCCI IN PAIRS IN CHAINS Performed at [facility], [place] [address], [place],  [address]    Culture PENDING  Incomplete   Report Status PENDING  Incomplete     Liver Function Tests: Recent Labs  Lab 11/18/18 2300 11/19/18 1723 11/20/18 0910  AST 52* 46* 53*  ALT 102* 84* 85*  ALKPHOS 206* 157* 157*  BILITOT 1.0 0.2* 0.7  PROT 7.2 6.3* 6.5  ALBUMIN 3.0* 2.6* 2.8*   No results for input(s): LIPASE, AMYLASE in the last 168 hours. No results for input(s): AMMONIA in the last 168 hours.  Cardiac Enzymes: Recent Labs  Lab 11/19/18 0236 11/19/18 0548 11/19/18 1048 11/19/18 1723  TROPONINI 0.10* 0.06* 0.05* 0.05*   BNP (last 3 results) Recent Labs    11/18/18 2248  BNP 106.2*    ProBNP (last 3 results) No results for input(s): PROBNP  in the last 8760 hours.    Studies: Ct Abdomen Wo Contrast  Result Date: 11/20/2018 CLINICAL DATA:  Abdominal pain status post cholecystectomy. EXAM: CT ABDOMEN WITHOUT CONTRAST TECHNIQUE: Multidetector CT imaging of the abdomen was performed following the standard protocol without IV contrast. COMPARISON:  Ultrasound of same day.  CT scan of June 07, 2018. FINDINGS: Lower chest: No acute abnormality. Hepatobiliary: Status post cholecystectomy. There is now noted 9.4 x 8.4 cm air-fluid collection with surrounding inflammation seen in the gallbladder fossa concerning for possible abscess or biloma. Pneumobilia is noted in left hepatic lobe. No definite intrahepatic biliary dilatation is noted. Pancreas: Large stent is seen in distal common bile duct extending into duodenum for treatment of pancreatic head mass noted on prior studies. Spleen: Normal in size without focal abnormality. Adrenals/Urinary Tract: Adrenal glands and kidneys appear normal. No hydronephrosis or renal obstruction is noted. No renal calculi are noted. Stomach/Bowel: The stomach appears normal. There is no evidence of bowel obstruction. Vascular/Lymphatic: Aortic atherosclerosis. No enlarged abdominal lymph nodes. Other: No abdominal wall  hernia or abnormality. Musculoskeletal: No acute or significant osseous findings. IMPRESSION: 9.4 x 8.4 cm air-fluid collection is noted in gallbladder fossa status post cholecystectomy, concerning for possible abscess or biloma. These results will be called to the ordering clinician or representative by the Radiologist Assistant, and communication documented in the PACS or zVision Dashboard. Large stent is noted in distal common bile duct extending into duodenum for treatment of pancreatic head mass. Electronically Signed   By: Marijo Conception M.D.   On: 11/20/2018 15:58   US Abdomen Complete  Result Date: 11/20/2018 CLINICAL DATA:  Right upper quadrant abdominal pain. EXAM: ABDOMEN ULTRASOUND  COMPLETE COMPARISON:  MRI of October 24, 2018. FINDINGS: Gallbladder: Status post cholecystectomy. Complex rounded abnormality measuring 9.8 x 7.8 cm is seen in the gallbladder fossa which may represent hematoma, biloma or possible abscess. Common bile duct: Diameter: 4 mm which is within normal limits. Liver: No focal lesion identified. Within normal limits in parenchymal echogenicity. Portal vein is patent on color Doppler imaging with normal direction of blood flow towards the liver. IVC: No abnormality visualized. Pancreas: Irregular hypoechoic abnormality measuring 4.0 x 3.5 x 1.8 cm is seen in the pancreatic head consistent with a history of pancreatic malignancy. No ductal dilatation is noted. Spleen: Spleen measures 12.4 x 12.8 x 6.5 cm with calculated volume of 530 cubic cm, consistent with mild splenomegaly. No focal abnormality is noted. Right Kidney: Length: 12.7 cm. Echogenicity within normal limits. No mass or hydronephrosis visualized. Left Kidney: Length: 12.9 cm. Echogenicity within normal limits. No mass or hydronephrosis visualized. Abdominal aorta: No aneurysm visualized. Other findings: None. IMPRESSION: Complex rounded abnormality seen in gallbladder fossa status post recent cholecystectomy. This is concerning for possible biloma, abscess or hematoma. Further evaluation with CT scan of the abdomen is recommended. These results will be called to the ordering clinician or representative by the Radiologist Assistant, and communication documented in the PACS or zVision Dashboard. 4.0 cm irregular hypoechoic abnormality seen in pancreatic head consistent with history of pancreatic neoplasm. Mild splenomegaly. Electronically Signed   By: Marijo Conception M.D.   On: 11/20/2018 13:32   Ct Image Guided Fluid Drain By Catheter  Result Date: 11/21/2018 INDICATION: 58 year old male with postoperative (laparoscopic cholecystectomy 11/10/2018) abscess in the gallbladder fossa. He presents for percutaneous  drain placement. EXAM: CT-guided drain placement MEDICATIONS: The patient is currently admitted to the hospital and receiving intravenous antibiotics. The antibiotics were administered within an appropriate time frame prior to the initiation of the procedure. ANESTHESIA/SEDATION: Fentanyl 100 mcg IV; Versed 3 mg IV Moderate Sedation Time:  20 The patient was continuously monitored during the procedure by the interventional radiology nurse under my direct supervision. COMPLICATIONS: None immediate. PROCEDURE: Informed written consent was obtained from the patient after a thorough discussion of the procedural risks, benefits and alternatives. All questions were addressed. Maximal Sterile Barrier Technique was utilized including caps, mask, sterile gowns, sterile gloves, sterile drape, hand hygiene and skin antiseptic. A timeout was performed prior to the initiation of the procedure. A planning axial CT scan was performed. The fluid and gas collection in the gallbladder fossa was identified. A suitable skin entry site was selected and marked. The overlying skin was sterilely prepped and draped in the standard fashion using chlorhexidine skin prep. Local anesthesia was attained by infiltration with 1% lidocaine. Under intermittent CT guidance, an 18 gauge trocar needle was advanced into the collection. An Amplatz wire was then coiled within the collection. The needle was removed and the  tract dilated to 12 [place]. A Cook 12 [place] all-purpose drainage catheter modified with additional sideholes was then advanced over the wire and formed in the fluid and gas collection. Aspiration yields approximately 380 mL of foul-smelling purulent fluid. The catheter was then flushed and connected to JP bulb suction. Post placement CT imaging demonstrates a well-positioned drainage catheter in the gallbladder fossa with complete aspiration of the fluid and gas collection. IMPRESSION: 1. Placement of a 12 French drainage catheter into  the gallbladder fossa fluid and gas collection with aspiration of 380 mL foul-smelling purulent fluid. A sample was sent for Gram stain and culture. PLAN: 1. Maintain drain to JP bulb suction. 2. Flush at least once per shift. 3. Once output is minimal (less than 15 mL over 24 hours) for 72 consecutive hours, the drainage catheter can be removed. If patient is discharged prior to drain removal, please arrange follow-up in interventional radiology at [facility] 1-2 weeks after the date of discharge. Signed, Criselda Peaches, MD, [facility] Electronically Signed   By: Jacqulynn Cadet M.D.   On: 11/21/2018 14:51   Nm Hepato Biliary Leak  Result Date: 11/20/2018 CLINICAL DATA:  Cholecystectomy on 11/10/2018. Mid to low back pain for 6 days with fever. EXAM: NUCLEAR MEDICINE HEPATOBILIARY IMAGING TECHNIQUE: Sequential images of the abdomen were obtained out to 60 minutes following intravenous administration of radiopharmaceutical. RADIOPHARMACEUTICALS:  5.3 mCi Tc-23m Choletec IV COMPARISON:  CT scan from earlier today. FINDINGS: Brisk uptake of radiotracer by the hepatic parenchyma noted with clearance of blood pool activity by 5-10 minutes. Biliary activity is visible by 10 minutes and gut activity is visible by 10-15 minutes. No accumulation of radiotracer in the gallbladder fossa fluid collection seen on previous CT. No accumulation of radiotracer along the surface of the liver. No evidence for pooling of radiotracer in the peritoneal cavity. IMPRESSION: 1. Normal hepatobiliary patency study. 2. No evidence for bile leak. Electronically Signed   By: EMisty StanleyM.D.   On: 11/20/2018 18:13    Scheduled Meds: . aspirin EC  81 mg Oral Daily  . folic acid  1 mg Oral QPC breakfast  . loratadine  10 mg Oral QPC breakfast  . nicotine  21 mg Transdermal Daily  . pantoprazole  40 mg Oral Daily  . sodium chloride flush  5 mL  Intracatheter Q8H    Admission status: Inpatient: Based on patients clinical presentation and evaluation of above clinical data, I have made determination that patient meets Inpatient criteria at this time.  Time spent: 20 min  [facility] Pager [phone] If 7PM-7AM, please contact night-coverage at [email or web address], Office  [phone] password [email or web address]  11/22/2018, 10:35 AM  LOS: 3 days

## 2018-11-22 NOTE — Progress Notes (Signed)
ANTICOAGULATION CONSULT NOTE -  Consult  Pharmacy Consult for heparin Indication: VTE treatment  Allergies  Allergen Reactions  . Contrast Media [Iodinated Diagnostic Agents] Other (See Comments)    Burning sensation [SEE CONTRAST MEDIA]  . Iohexol Anaphylaxis, Itching and Rash    Patient Measurements: Height: 6' (182.9 cm) Weight: 197 lb 1.5 oz (89.4 kg) IBW/kg (Calculated) : 77.6 Heparin Dosing Weight: ABW  Vital Signs: Temp: 98 F (36.7 C) (04/17 2100) Temp Source: Oral (04/17 2100) BP: 114/77 (04/17 2100) Pulse Rate: 110 (04/17 2100)  Labs: Recent Labs    11/19/18 0548 11/19/18 1048 11/19/18 1723 11/20/18 0910 11/21/18 0453 11/21/18 1513 11/21/18 2349  HGB  --   --   --  10.5* 10.4*  --   --   HCT  --   --   --  32.6* 32.2*  --   --   PLT  --   --   --  158 183  --   --   APTT  --   --   --   --   --  33  --   LABPROT  --   --   --   --  13.8  --   --   INR  --   --   --   --  1.1  --   --   HEPARINUNFRC  --   --   --   --   --  <0.10* <0.10*  CREATININE  --   --  0.71 0.76 0.73  --   --   TROPONINI 0.06* 0.05* 0.05*  --   --   --   --     Estimated Creatinine Clearance: 107.8 mL/min (by C-G formula based on SCr of 0.73 mg/dL).   Medical History: Past Medical History:  Diagnosis Date  . Anxiety   . Cancer ([place]) 06/2018   Pancreatic  . GERD (gastroesophageal reflux disease)       Assessment: 58 y.o. 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  Admitted with abd pain.  Pharmacy consulted to dose heparin.  Last dose of xarelto 4/15 @ 0925  4/17 H/H low Plts WNL Stat HL and aPTT ordered Scr WNL Today, 4/18 0009 HL = < 0.10 below goal, no infuson or bleeding issues per RN.  Goal of Therapy:  Heparin level 0.3-0.7 units/ml  Monitor platelets by anticoagulation protocol   Plan:  Give heparin 2000 unit bolus x1 now Increase heparin drip  to 1600 units/hr Check HL in 6 hours Daily CBC and heparin level  Dorrene German 11/22/2018, 2:07 AM     Dorrene German 11/22/2018,2:07 AM

## 2018-11-22 NOTE — Progress Notes (Addendum)
ANTICOAGULATION CONSULT NOTE -  Consult  Pharmacy Consult for heparin>>Xarelto Indication: VTE treatment  Allergies  Allergen Reactions  . Contrast Media [Iodinated Diagnostic Agents] Other (See Comments)    Burning sensation [SEE CONTRAST MEDIA]  . Iohexol Anaphylaxis, Itching and Rash    Patient Measurements: Height: 6' (182.9 cm) Weight: 197 lb 1.5 oz (89.4 kg) IBW/kg (Calculated) : 77.6 Heparin Dosing Weight: ABW  Vital Signs: Temp: 98.4 F (36.9 C) (04/18 0534) Temp Source: Oral (04/18 0534) BP: 115/76 (04/18 0534) Pulse Rate: 84 (04/18 0534)  Labs: Recent Labs    11/19/18 1723  11/20/18 0910 11/21/18 0453 11/21/18 1513 11/21/18 2349 11/22/18 0841  HGB  --    < > 10.5* 10.4*  --   --  11.5*  HCT  --   --  32.6* 32.2*  --   --  36.3*  PLT  --   --  158 183  --   --  226  APTT  --   --   --   --  33  --   --   LABPROT  --   --   --  13.8  --   --   --   INR  --   --   --  1.1  --   --   --   HEPARINUNFRC  --   --   --   --  <0.10* <0.10*  --   CREATININE 0.71  --  0.76 0.73  --   --   --   TROPONINI 0.05*  --   --   --   --   --   --    < > = values in this interval not displayed.    Estimated Creatinine Clearance: 107.8 mL/min (by C-G formula based on SCr of 0.73 mg/dL).   Medical History: Past Medical History:  Diagnosis Date  . Anxiety   . Cancer ([place]) 06/2018   Pancreatic  . GERD (gastroesophageal reflux disease)     Assessment: 58 y.o. 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  Admitted with abd pain.  Last dose of xarelto 4/15 @ 0925. Xarelto held for IR drain placement>>heparin bridge started peri-procedure 4/17>>plan resume Xarelto 4/18  11/22/2018:  CBC stable, no bleeding noted  Renal function at baseline  Plan:  D/C heparin & heparin labs Restart Xarelto 20mg  po daily with food Change Flagyl to PO per P&T policy  Biagio Borg 11/22/2018,11:22 AM

## 2018-11-22 NOTE — Progress Notes (Signed)
Subjective/Chief Complaint: Complains of some bloating. No flatus yet   Objective: Vital signs in last 24 hours: Temp:  [97.4 F (36.3 C)-98.8 F (37.1 C)] 98.4 F (36.9 C) (04/18 0534) Pulse Rate:  [78-110] 84 (04/18 0534) Resp:  [16-27] 18 (04/18 0534) BP: (114-142)/(75-86) 115/76 (04/18 0534) SpO2:  [95 %-99 %] 95 % (04/18 0534) Last BM Date: 11/20/18  Intake/Output from previous day: 04/17 0701 - 04/18 0700 In: 2048 [P.O.:240; I.V.:1256.8; IV Piggyback:551.2] Out: 1340 [Urine:850; Drains:490] Intake/Output this shift: No intake/output data recorded.  General appearance: alert and cooperative Resp: clear to auscultation bilaterally Cardio: regular rate and rhythm GI: soft, mild tenderness. few bs. drain output serosanguinous  Lab Results:  Recent Labs    11/20/18 0910 11/21/18 0453  WBC 8.5 7.9  HGB 10.5* 10.4*  HCT 32.6* 32.2*  PLT 158 183   BMET Recent Labs    11/20/18 0910 11/21/18 0453  NA 135 135  K 3.3* 3.7  CL 105 104  CO2 22 22  GLUCOSE 109* 116*  BUN 9 7  CREATININE 0.76 0.73  CALCIUM 8.2* 8.2*   PT/INR Recent Labs    11/21/18 0453  LABPROT 13.8  INR 1.1   ABG No results for input(s): PHART, HCO3 in the last 72 hours.  Invalid input(s): PCO2, PO2  Studies/Results: Ct Abdomen Wo Contrast  Result Date: 11/20/2018 CLINICAL DATA:  Abdominal pain status post cholecystectomy. EXAM: CT ABDOMEN WITHOUT CONTRAST TECHNIQUE: Multidetector CT imaging of the abdomen was performed following the standard protocol without IV contrast. COMPARISON:  Ultrasound of same day.  CT scan of June 07, 2018. FINDINGS: Lower chest: No acute abnormality. Hepatobiliary: Status post cholecystectomy. There is now noted 9.4 x 8.4 cm air-fluid collection with surrounding inflammation seen in the gallbladder fossa concerning for possible abscess or biloma. Pneumobilia is noted in left hepatic lobe. No definite intrahepatic biliary dilatation is noted. Pancreas:  Large stent is seen in distal common bile duct extending into duodenum for treatment of pancreatic head mass noted on prior studies. Spleen: Normal in size without focal abnormality. Adrenals/Urinary Tract: Adrenal glands and kidneys appear normal. No hydronephrosis or renal obstruction is noted. No renal calculi are noted. Stomach/Bowel: The stomach appears normal. There is no evidence of bowel obstruction. Vascular/Lymphatic: Aortic atherosclerosis. No enlarged abdominal lymph nodes. Other: No abdominal wall hernia or abnormality. Musculoskeletal: No acute or significant osseous findings. IMPRESSION: 9.4 x 8.4 cm air-fluid collection is noted in gallbladder fossa status post cholecystectomy, concerning for possible abscess or biloma. These results will be called to the ordering clinician or representative by the Radiologist Assistant, and communication documented in the PACS or zVision Dashboard. Large stent is noted in distal common bile duct extending into duodenum for treatment of pancreatic head mass. Electronically Signed   By: Marijo Conception M.D.   On: 11/20/2018 15:58   US Abdomen Complete  Result Date: 11/20/2018 CLINICAL DATA:  Right upper quadrant abdominal pain. EXAM: ABDOMEN ULTRASOUND COMPLETE COMPARISON:  MRI of October 24, 2018. FINDINGS: Gallbladder: Status post cholecystectomy. Complex rounded abnormality measuring 9.8 x 7.8 cm is seen in the gallbladder fossa which may represent hematoma, biloma or possible abscess. Common bile duct: Diameter: 4 mm which is within normal limits. Liver: No focal lesion identified. Within normal limits in parenchymal echogenicity. Portal vein is patent on color Doppler imaging with normal direction of blood flow towards the liver. IVC: No abnormality visualized. Pancreas: Irregular hypoechoic abnormality measuring 4.0 x 3.5 x 1.8 cm is seen in  the pancreatic head consistent with a history of pancreatic malignancy. No ductal dilatation is noted. Spleen: Spleen  measures 12.4 x 12.8 x 6.5 cm with calculated volume of 530 cubic cm, consistent with mild splenomegaly. No focal abnormality is noted. Right Kidney: Length: 12.7 cm. Echogenicity within normal limits. No mass or hydronephrosis visualized. Left Kidney: Length: 12.9 cm. Echogenicity within normal limits. No mass or hydronephrosis visualized. Abdominal aorta: No aneurysm visualized. Other findings: None. IMPRESSION: Complex rounded abnormality seen in gallbladder fossa status post recent cholecystectomy. This is concerning for possible biloma, abscess or hematoma. Further evaluation with CT scan of the abdomen is recommended. These results will be called to the ordering clinician or representative by the Radiologist Assistant, and communication documented in the PACS or zVision Dashboard. 4.0 cm irregular hypoechoic abnormality seen in pancreatic head consistent with history of pancreatic neoplasm. Mild splenomegaly. Electronically Signed   By: Marijo Conception M.D.   On: 11/20/2018 13:32   Ct Image Guided Fluid Drain By Catheter  Result Date: 11/21/2018 INDICATION: 58 year old male with postoperative (laparoscopic cholecystectomy 11/10/2018) abscess in the gallbladder fossa. He presents for percutaneous drain placement. EXAM: CT-guided drain placement MEDICATIONS: The patient is currently admitted to the hospital and receiving intravenous antibiotics. The antibiotics were administered within an appropriate time frame prior to the initiation of the procedure. ANESTHESIA/SEDATION: Fentanyl 100 mcg IV; Versed 3 mg IV Moderate Sedation Time:  20 The patient was continuously monitored during the procedure by the interventional radiology nurse under my direct supervision. COMPLICATIONS: None immediate. PROCEDURE: Informed written consent was obtained from the patient after a thorough discussion of the procedural risks, benefits and alternatives. All questions were addressed. Maximal Sterile Barrier Technique was  utilized including caps, mask, sterile gowns, sterile gloves, sterile drape, hand hygiene and skin antiseptic. A timeout was performed prior to the initiation of the procedure. A planning axial CT scan was performed. The fluid and gas collection in the gallbladder fossa was identified. A suitable skin entry site was selected and marked. The overlying skin was sterilely prepped and draped in the standard fashion using chlorhexidine skin prep. Local anesthesia was attained by infiltration with 1% lidocaine. Under intermittent CT guidance, an 18 gauge trocar needle was advanced into the collection. An Amplatz wire was then coiled within the collection. The needle was removed and the tract dilated to 12 [place]. A Cook 12 [place] all-purpose drainage catheter modified with additional sideholes was then advanced over the wire and formed in the fluid and gas collection. Aspiration yields approximately 380 mL of foul-smelling purulent fluid. The catheter was then flushed and connected to JP bulb suction. Post placement CT imaging demonstrates a well-positioned drainage catheter in the gallbladder fossa with complete aspiration of the fluid and gas collection. IMPRESSION: 1. Placement of a 12 French drainage catheter into the gallbladder fossa fluid and gas collection with aspiration of 380 mL foul-smelling purulent fluid. A sample was sent for Gram stain and culture. PLAN: 1. Maintain drain to JP bulb suction. 2. Flush at least once per shift. 3. Once output is minimal (less than 15 mL over 24 hours) for 72 consecutive hours, the drainage catheter can be removed. If patient is discharged prior to drain removal, please arrange follow-up in interventional radiology at [facility] 1-2 weeks after the date of discharge. Signed, Criselda Peaches, MD, [facility] Electronically Signed   By: Jacqulynn Cadet M.D.   On: 11/21/2018 14:51   Nm Hepato Biliary  Leak  Result Date: 11/20/2018 CLINICAL DATA:  Cholecystectomy on 11/10/2018. Mid to low back pain for 6 days with fever. EXAM: NUCLEAR MEDICINE HEPATOBILIARY IMAGING TECHNIQUE: Sequential images of the abdomen were obtained out to 60 minutes following intravenous administration of radiopharmaceutical. RADIOPHARMACEUTICALS:  5.3 mCi Tc-51m  Choletec IV COMPARISON:  CT scan from earlier today. FINDINGS: Brisk uptake of radiotracer by the hepatic parenchyma noted with clearance of blood pool activity by 5-10 minutes. Biliary activity is visible by 10 minutes and gut activity is visible by 10-15 minutes. No accumulation of radiotracer in the gallbladder fossa fluid collection seen on previous CT. No accumulation of radiotracer along the surface of the liver. No evidence for pooling of radiotracer in the peritoneal cavity. IMPRESSION: 1. Normal hepatobiliary patency study. 2. No evidence for bile leak. Electronically Signed   By: Misty Stanley M.D.   On: 11/20/2018 18:13    Anti-infectives: Anti-infectives (From admission, onward)   Start     Dose/Rate Route Frequency Ordered Stop   11/20/18 1400  metroNIDAZOLE (FLAGYL) IVPB 500 mg     500 mg 100 mL/hr over 60 Minutes Intravenous Every 8 hours 11/20/18 1107     11/19/18 2300  azithromycin (ZITHROMAX) 500 mg in sodium chloride 0.9 % 250 mL IVPB  Status:  Discontinued     500 mg 250 mL/hr over 60 Minutes Intravenous Every 24 hours 11/19/18 0204 11/20/18 1107   11/19/18 2200  cefTRIAXone (ROCEPHIN) 1 g in sodium chloride 0.9 % 100 mL IVPB  Status:  Discontinued     1 g 200 mL/hr over 30 Minutes Intravenous Every 24 hours 11/19/18 0204 11/19/18 1719   11/19/18 1800  cefTRIAXone (ROCEPHIN) 2 g in sodium chloride 0.9 % 100 mL IVPB     2 g 200 mL/hr over 30 Minutes Intravenous Every 24 hours 11/19/18 1719     11/18/18 2300  cefTRIAXone (ROCEPHIN) 1 g in sodium chloride 0.9 % 100 mL IVPB     1 g 200 mL/hr over 30 Minutes Intravenous  Once 11/18/18 2252  11/18/18 2348   11/18/18 2300  azithromycin (ZITHROMAX) 500 mg in sodium chloride 0.9 % 250 mL IVPB     500 mg 250 mL/hr over 60 Minutes Intravenous  Once 11/18/18 2252 11/19/18 0043      Assessment/Plan: s/p * No surgery found * Continue rocephin and flagyl and drain  Advance diet as tolerated Ambulate Metastatic pancreatic cancer. Will need to follow up with Dr. Barry Dienes after d/c  LOS: 3 days    Ernest Wu 11/22/2018

## 2018-11-23 LAB — COMPREHENSIVE METABOLIC PANEL
ALT: 73 U/L — ABNORMAL HIGH (ref 0–44)
AST: 50 U/L — ABNORMAL HIGH (ref 15–41)
Albumin: 2.4 g/dL — ABNORMAL LOW (ref 3.5–5.0)
Alkaline Phosphatase: 145 U/L — ABNORMAL HIGH (ref 38–126)
Anion gap: 9 (ref 5–15)
BUN: 10 mg/dL (ref 6–20)
CO2: 26 mmol/L (ref 22–32)
Calcium: 7.9 mg/dL — ABNORMAL LOW (ref 8.9–10.3)
Chloride: 102 mmol/L (ref 98–111)
Creatinine, Ser: 0.83 mg/dL (ref 0.61–1.24)
GFR calc Af Amer: >60 mL/min (ref >60)
GFR calc non Af Amer: >60 mL/min (ref >60)
Glucose, Bld: 130 mg/dL — ABNORMAL HIGH (ref 70–99)
Potassium: 2.9 mmol/L — ABNORMAL LOW (ref 3.5–5.1)
Sodium: 137 mmol/L (ref 135–145)
Total Bilirubin: 0.5 mg/dL (ref 0.3–1.2)
Total Protein: 6.4 g/dL — ABNORMAL LOW (ref 6.5–8.1)

## 2018-11-23 LAB — CULTURE, RESPIRATORY W GRAM STAIN: Culture: NORMAL

## 2018-11-23 LAB — MAGNESIUM: Magnesium: 2.4 mg/dL (ref 1.7–2.4)

## 2018-11-23 LAB — CULTURE, RESPIRATORY

## 2018-11-23 MED ORDER — DOCUSATE SODIUM 100 MG PO CAPS
100.0000 mg | ORAL_CAPSULE | Freq: Two times a day (BID) | ORAL | Status: DC
  Administered 2018-11-23 – 2018-11-24 (×3): 100 mg via ORAL
  Filled 2018-11-23 (×3): qty 1

## 2018-11-23 MED ORDER — POLYETHYLENE GLYCOL 3350 17 G PO PACK
17.0000 g | PACK | Freq: Every day | ORAL | Status: DC
  Administered 2018-11-23: 09:00:00 17 g via ORAL
  Filled 2018-11-23 (×2): qty 1

## 2018-11-23 MED ORDER — POTASSIUM CHLORIDE 10 MEQ/100ML IV SOLN
10.0000 meq | INTRAVENOUS | Status: AC
  Administered 2018-11-23 (×4): 10 meq via INTRAVENOUS
  Filled 2018-11-23 (×4): qty 100

## 2018-11-23 NOTE — Progress Notes (Signed)
Subjective/Chief Complaint: No complaints. Had bm yesterday but feels more bloated today   Objective: Vital signs in last 24 hours: Temp:  [97.5 F (36.4 C)-98.4 F (36.9 C)] 98.3 F (36.8 C) (04/19 0446) Pulse Rate:  [75-92] 77 (04/19 0446) Resp:  [20] 20 (04/19 0446) BP: (112-120)/(70-73) 120/73 (04/19 0446) SpO2:  [95 %-97 %] 95 % (04/19 0446) Last BM Date: 11/20/18  Intake/Output from previous day: 04/18 0701 - 04/19 0700 In: 1059.2 [P.O.:240; I.V.:814.2] Out: 40 [Drains:40] Intake/Output this shift: No intake/output data recorded.  General appearance: alert and cooperative Resp: clear to auscultation bilaterally Cardio: regular rate and rhythm GI: soft, mild distension with mild tendernss. drain output serosanguinous  Lab Results:  Recent Labs    11/21/18 0453 11/22/18 0841  WBC 7.9 8.9  HGB 10.4* 11.5*  HCT 32.2* 36.3*  PLT 183 226   BMET Recent Labs    11/21/18 0453 11/23/18 0412  NA 135 137  K 3.7 2.9*  CL 104 102  CO2 22 26  GLUCOSE 116* 130*  BUN 7 10  CREATININE 0.73 0.83  CALCIUM 8.2* 7.9*   PT/INR Recent Labs    11/21/18 0453  LABPROT 13.8  INR 1.1   ABG No results for input(s): PHART, HCO3 in the last 72 hours.  Invalid input(s): PCO2, PO2  Studies/Results: Ct Image Guided Fluid Drain By Catheter  Result Date: 11/21/2018 INDICATION: 58 year old male with postoperative (laparoscopic cholecystectomy 11/10/2018) abscess in the gallbladder fossa. He presents for percutaneous drain placement. EXAM: CT-guided drain placement MEDICATIONS: The patient is currently admitted to the hospital and receiving intravenous antibiotics. The antibiotics were administered within an appropriate time frame prior to the initiation of the procedure. ANESTHESIA/SEDATION: Fentanyl 100 mcg IV; Versed 3 mg IV Moderate Sedation Time:  20 The patient was continuously monitored during the procedure by the interventional radiology nurse under my direct  supervision. COMPLICATIONS: None immediate. PROCEDURE: Informed written consent was obtained from the patient after a thorough discussion of the procedural risks, benefits and alternatives. All questions were addressed. Maximal Sterile Barrier Technique was utilized including caps, mask, sterile gowns, sterile gloves, sterile drape, hand hygiene and skin antiseptic. A timeout was performed prior to the initiation of the procedure. A planning axial CT scan was performed. The fluid and gas collection in the gallbladder fossa was identified. A suitable skin entry site was selected and marked. The overlying skin was sterilely prepped and draped in the standard fashion using chlorhexidine skin prep. Local anesthesia was attained by infiltration with 1% lidocaine. Under intermittent CT guidance, an 18 gauge trocar needle was advanced into the collection. An Amplatz wire was then coiled within the collection. The needle was removed and the tract dilated to 12 [place]. A Cook 12 [place] all-purpose drainage catheter modified with additional sideholes was then advanced over the wire and formed in the fluid and gas collection. Aspiration yields approximately 380 mL of foul-smelling purulent fluid. The catheter was then flushed and connected to JP bulb suction. Post placement CT imaging demonstrates a well-positioned drainage catheter in the gallbladder fossa with complete aspiration of the fluid and gas collection. IMPRESSION: 1. Placement of a 12 French drainage catheter into the gallbladder fossa fluid and gas collection with aspiration of 380 mL foul-smelling purulent fluid. A sample was sent for Gram stain and culture. PLAN: 1. Maintain drain to JP bulb suction. 2. Flush at least once per shift. 3. Once output is minimal (less than 15 mL over 24 hours) for 72 consecutive hours, the  drainage catheter can be removed. If patient is discharged prior to drain removal, please arrange follow-up in interventional radiology at [facility] 1-2 weeks after the date of discharge. Signed, Criselda Peaches, MD, [facility] Radiology Electronically Signed   By: Jacqulynn Cadet M.D.   On: 11/21/2018 14:51    Anti-infectives: Anti-infectives (From admission, onward)   Start     Dose/Rate Route Frequency Ordered Stop   11/22/18 1400  metroNIDAZOLE (FLAGYL) tablet 500 mg     500 mg Oral Every 8 hours 11/22/18 1128     11/20/18 1400  metroNIDAZOLE (FLAGYL) IVPB 500 mg  Status:  Discontinued     500 mg 100 mL/hr over 60 Minutes Intravenous Every 8 hours 11/20/18 1107 11/22/18 1128   11/19/18 2300  azithromycin (ZITHROMAX) 500 mg in sodium chloride 0.9 % 250 mL IVPB  Status:  Discontinued     500 mg 250 mL/hr over 60 Minutes Intravenous Every 24 hours 11/19/18 0204 11/20/18 1107   11/19/18 2200  cefTRIAXone (ROCEPHIN) 1 g in sodium chloride 0.9 % 100 mL IVPB  Status:  Discontinued     1 g 200 mL/hr over 30 Minutes Intravenous Every 24 hours 11/19/18 0204 11/19/18 1719   11/19/18 1800  cefTRIAXone (ROCEPHIN) 2 g in sodium chloride 0.9 % 100 mL IVPB     2 g 200 mL/hr over 30 Minutes Intravenous Every 24 hours 11/19/18 1719     11/18/18 2300  cefTRIAXone (ROCEPHIN) 1 g in sodium chloride 0.9 % 100 mL IVPB     1 g 200 mL/hr over 30 Minutes Intravenous  Once 11/18/18 2252 11/18/18 2348   11/18/18 2300  azithromycin (ZITHROMAX) 500 mg in sodium chloride 0.9 % 250 mL IVPB     500 mg 250 mL/hr over 60 Minutes Intravenous  Once 11/18/18 2252 11/19/18 0043      Assessment/Plan: s/p * No surgery found * still seems to have an element of ileus. will add stool softener and laxative  Ambulate S/p lap chole Metastatic pancreatic cancer  LOS: 4 days    Ernest Wu 11/23/2018

## 2018-11-23 NOTE — Progress Notes (Signed)
[facility]  PROGRESS NOTE  Ernest Wu QIH:[id number] DOB: [birth year] DOA: 11/18/2018 PCP: [name], [name], MD   Brief HPI:   58 year old male with a history of pancreatic cancer on chemotherapy, tobacco abuse, GERD, anxiety came with cough, shortness of breath fever and chills.  Patient's temp was 102.4.  Also was coughing up yellow-colored phlegm.  No contact with COVID-19 patients.    Subjective   Patient seen and examined, still no BM yet.  Potassium was 2.9 this morning   Assessment/Plan:     1. Acute bronchitis-resolved,  chest x-ray was negative for infiltrate.  COVID-19 test came back negative.  Patient started on IV azithromycin and Rocephin.  LDH 154, lactic acid 1.2, procalcitonin 14.95.  We will continue with antibiotics at this time.C-reactive protein 23.5, d-dimer elevated 3.82, (Patient is on Xarelto) strep pneumo urinary antigen negative, urine Legionella  antigen is negative.  2. Gram-negative rod bacteremia/abscess in the gallbladder fossa-blood culture grew Hafnia species, sensitive to ceftriaxone.  Physical exam  revealed patient was found to have tenderness in right upper quadrant, he recently had laparoscopic cholecystectomy 10 days ago.  Abdominal ultrasound was obtained which showed biloma versus abscess.  IR and general surgery were consulted.  CT abdomen without contrast confirmed abscess.  Patient underwent drainage of the abscess along with placement of JP drain yesterday.  Continue ceftriaxone, Flagyl. Abscess culture is currently pending.   3. Hypokalemia-patient's potassium was 2.9, which is contributing to patient's ileus.  Will replace potassium today with IV KCl 10 mEq x 4.  Follow serum potassium level in a.m.  4. Adenocarcinoma of pancreas-diagnosed in November 2019, patient on chemotherapy.  Last dose was 10/16/2018.  Follow-up with Dr. Burr Medico as outpatient.    5. 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  Xarelto was held yesterday, per IR will order heparin per pharmacy today.as per oncology if Xarelto is held more than 24 hours then heparin bridging is preferred due to high risk of thrombosis from metastatic pancreatic cancer and recurrent left  thrombosis.  Heparin was discontinued yesterday, patient started on Xarelto.  6. Transaminitis-patient has elevated liver enzymes likely from pancreatic cancer, AST 52, ALT 102, alk phos 206.  Follow liver enzymes in a.m.   No 2525 CBC: Recent Labs  Lab 11/18/18 2300 11/19/18 0236 11/20/18 0910 11/21/18 0453 11/22/18 0841  WBC 3.0*  --  8.5 7.9 8.9  NEUTROABS 2.8  --   --   --   --   HGB 11.9* 10.5* 10.5* 10.4* 11.5*  HCT 36.9*  --  32.6* 32.2* 36.3*  MCV 97.9  --  98.8 97.6 96.8  PLT 158  --  158 183 875    Basic Metabolic Panel: Recent Labs  Lab 11/18/18 2300 11/19/18 0548 11/19/18 1723 11/20/18 0910 11/21/18 0453 11/23/18 0412  NA 133*  --  136 135 135 137  K 3.4*  --  3.3* 3.3* 3.7 2.9*  CL 97*  --  107 105 104 102  CO2 25  --  21* 22 22 26   GLUCOSE 136*  --  115* 109* 116* 130*  BUN 14  --  10 9 7 10   CREATININE 0.92  --  0.71 0.76 0.73 0.83  CALCIUM 8.3*  --  7.6* 8.2* 8.2* 7.9*  MG  --  2.0  --   --   --   --      DVT prophylaxis: Xarelto  Code Status: Full code  Family Communication: No family  at bedside  Disposition Plan: likely home when medically ready for discharge     Consultants:    Procedures:     Antibiotics:   Anti-infectives (From admission, onward)   Start     Dose/Rate Route Frequency Ordered Stop   11/22/18 1400  metroNIDAZOLE (FLAGYL) tablet 500 mg     500 mg Oral Every 8 hours 11/22/18 1128     11/20/18 1400  metroNIDAZOLE (FLAGYL) IVPB 500 mg  Status:  Discontinued     500 mg 100 mL/hr over 60 Minutes Intravenous Every 8 hours 11/20/18 1107 11/22/18 1128   11/19/18 2300  azithromycin (ZITHROMAX) 500 mg in sodium chloride 0.9 % 250 mL IVPB  Status:  Discontinued     500 mg 250  mL/hr over 60 Minutes Intravenous Every 24 hours 11/19/18 0204 11/20/18 1107   11/19/18 2200  cefTRIAXone (ROCEPHIN) 1 g in sodium chloride 0.9 % 100 mL IVPB  Status:  Discontinued     1 g 200 mL/hr over 30 Minutes Intravenous Every 24 hours 11/19/18 0204 11/19/18 1719   11/19/18 1800  cefTRIAXone (ROCEPHIN) 2 g in sodium chloride 0.9 % 100 mL IVPB     2 g 200 mL/hr over 30 Minutes Intravenous Every 24 hours 11/19/18 1719     11/18/18 2300  cefTRIAXone (ROCEPHIN) 1 g in sodium chloride 0.9 % 100 mL IVPB     1 g 200 mL/hr over 30 Minutes Intravenous  Once 11/18/18 2252 11/18/18 2348   11/18/18 2300  azithromycin (ZITHROMAX) 500 mg in sodium chloride 0.9 % 250 mL IVPB     500 mg 250 mL/hr over 60 Minutes Intravenous  Once 11/18/18 2252 11/19/18 0043       Objective   Vitals:   11/22/18 1244 11/22/18 2047 11/23/18 0446 11/23/18 1358  BP: 112/71 116/70 120/73 111/74  Pulse: 75 92 77 77  Resp: 20 20 20 19   Temp: (!) 97.5 F (36.4 C) 98.4 F (36.9 C) 98.3 F (36.8 C) 98.7 F (37.1 C)  TempSrc: Oral Oral Oral Oral  SpO2: 97% 96% 95% 94%  Weight:      Height:        Intake/Output Summary (Last 24 hours) at 11/23/2018 1404 Last data filed at 11/23/2018 1359 Gross per 24 hour  Intake 944.23 ml  Output 45 ml  Net 899.23 ml   Filed Weights   11/18/18 2235 11/19/18 0520  Weight: 90.7 kg 89.4 kg     Physical Examination:   General: Appears in no acute distress  Cardiovascular: S1-S2, regular, no murmur auscultated  Respiratory: Clear to auscultation bilaterally  Abdomen: Abdomen is soft, JP drain in place.  Tenderness palpation right upper quadrant.  Mild guarding but no rigidity.  Extremities: No edema in the lower extremities  Neurologic: Alert, oriented x3, no focal deficit noted.     Data Reviewed: I have personally reviewed following labs and imaging studies   Recent Results (from the past 240 hour(s))  Blood Culture (routine x 2)     Status: Abnormal    Collection Time: 11/18/18 10:48 PM  Result Value Ref Range Status   Specimen Description   Final    BLOOD LEFT ANTECUBITAL Performed at [facility] [address] [place], [place] [address]    Special Requests   Final    BOTTLES DRAWN AEROBIC AND ANAEROBIC Blood Culture adequate volume Performed at [facility] [address] [place], [place] [address]    Culture  Setup Time  Final    GRAM NEGATIVE RODS ANAEROBIC BOTTLE ONLY CRITICAL RESULT CALLED TO, READ BACK BY AND VERIFIED WITH: Shelda Jakes PharmD 17:10 11/19/18 ([email or web address])    Culture HAFNIA SPECIES (A)  Final   Report Status 11/22/2018 FINAL  Final   Organism ID, Bacteria HAFNIA SPECIES  Final      Susceptibility   Hafnia species - MIC*    AMPICILLIN RESISTANT Resistant     CEFAZOLIN >=64 RESISTANT Resistant     CEFEPIME <=1 SENSITIVE Sensitive     CEFTAZIDIME 2 SENSITIVE Sensitive     CEFTRIAXONE <=1 SENSITIVE Sensitive     CIPROFLOXACIN <=0.25 SENSITIVE Sensitive     GENTAMICIN <=1 SENSITIVE Sensitive     IMIPENEM 0.5 SENSITIVE Sensitive     TRIMETH/SULFA <=20 SENSITIVE Sensitive     AMPICILLIN/SULBACTAM >=32 RESISTANT Resistant     PIP/TAZO 64 INTERMEDIATE Intermediate     * HAFNIA SPECIES  Blood Culture ID Panel (Reflexed)     Status: None   Collection Time: 11/18/18 10:48 PM  Result Value Ref Range Status   Enterococcus species NOT DETECTED NOT DETECTED Final   Listeria monocytogenes NOT DETECTED NOT DETECTED Final   Staphylococcus species NOT DETECTED NOT DETECTED Final   Staphylococcus aureus (BCID) NOT DETECTED NOT DETECTED Final   Streptococcus species NOT DETECTED NOT DETECTED Final   Streptococcus agalactiae NOT DETECTED NOT DETECTED Final   Streptococcus pneumoniae NOT DETECTED NOT DETECTED Final   Streptococcus pyogenes NOT DETECTED NOT DETECTED Final   Acinetobacter baumannii NOT DETECTED NOT DETECTED Final   Enterobacteriaceae species NOT DETECTED NOT DETECTED  Final   Enterobacter cloacae complex NOT DETECTED NOT DETECTED Final   Escherichia coli NOT DETECTED NOT DETECTED Final   Klebsiella oxytoca NOT DETECTED NOT DETECTED Final   Klebsiella pneumoniae NOT DETECTED NOT DETECTED Final   Proteus species NOT DETECTED NOT DETECTED Final   Serratia marcescens NOT DETECTED NOT DETECTED Final   Haemophilus influenzae NOT DETECTED NOT DETECTED Final   Neisseria meningitidis NOT DETECTED NOT DETECTED Final   Pseudomonas aeruginosa NOT DETECTED NOT DETECTED Final   Candida albicans NOT DETECTED NOT DETECTED Final   Candida glabrata NOT DETECTED NOT DETECTED Final   Candida krusei NOT DETECTED NOT DETECTED Final   Candida parapsilosis NOT DETECTED NOT DETECTED Final   Candida tropicalis NOT DETECTED NOT DETECTED Final    Comment: Performed at [facility], [address] [place], [place] [address]  SARS Coronavirus 2 [facility] order, Performed in [facility] hospital lab)     Status: None   Collection Time: 11/18/18 11:12 PM  Result Value Ref Range Status   SARS Coronavirus 2 NEGATIVE NEGATIVE Final    Comment: (NOTE) If result is NEGATIVE SARS-CoV-2 target nucleic acids are NOT DETECTED. The SARS-CoV-2 RNA is generally detectable in upper and lower  respiratory specimens during the acute phase of infection. The lowest  concentration of SARS-CoV-2 viral copies this assay can detect is 250  copies / mL. A negative result does not preclude SARS-CoV-2 infection  and should not be used as the sole basis for treatment or other  patient management decisions.  A negative result may occur with  improper specimen collection / handling, submission of specimen other  than nasopharyngeal swab, presence of viral mutation(s) within the  areas targeted by this assay, and inadequate number of viral copies  (<250 copies / mL). A negative result must be combined with clinical  observations, patient history, and epidemiological information. If result is  POSITIVE  SARS-CoV-2 target nucleic acids are DETECTED. The SARS-CoV-2 RNA is generally detectable in upper and lower  respiratory specimens dur ing the acute phase of infection.  Positive  results are indicative of active infection with SARS-CoV-2.  Clinical  correlation with patient history and other diagnostic information is  necessary to determine patient infection status.  Positive results do  not rule out bacterial infection or co-infection with other viruses. If result is PRESUMPTIVE POSTIVE SARS-CoV-2 nucleic acids MAY BE PRESENT.   A presumptive positive result was obtained on the submitted specimen  and confirmed on repeat testing.  While 2019 novel coronavirus  (SARS-CoV-2) nucleic acids may be present in the submitted sample  additional confirmatory testing may be necessary for epidemiological  and / or clinical management purposes  to differentiate between  SARS-CoV-2 and other Sarbecovirus currently known to infect humans.  If clinically indicated additional testing with an alternate test  methodology [phone]) is advised. The SARS-CoV-2 RNA is generally  detectable in upper and lower respiratory sp ecimens during the acute  phase of infection. The expected result is Negative. Fact Sheet for Patients:  StrictlyIdeas.no Fact Sheet for Healthcare Providers: BankingDealers.co.za This test is not yet approved or cleared by the [place] FDA and has been authorized for detection and/or diagnosis of SARS-CoV-2 by FDA under an Emergency Use Authorization (EUA).  This EUA will remain in effect (meaning this test can be used) for the duration of the COVID-19 declaration under Section 564(b)(1) of the Act, 21 U.S.C. section 360bbb-3(b)(1), unless the authorization is terminated or revoked sooner. Performed at [facility], [place] [address], [place], [place] [address]   Blood Culture (routine x 2)     Status: None  (Preliminary result)   Collection Time: 11/18/18 11:26 PM  Result Value Ref Range Status   Specimen Description   Final    BLOOD BLOOD LEFT FOREARM Performed at [facility] [address], [place], [place] [address]    Special Requests   Final    BOTTLES DRAWN AEROBIC AND ANAEROBIC Blood Culture adequate volume Performed at [facility] [address], [place], [place] [address]    Culture   Final    NO GROWTH 4 DAYS Performed at [facility], [place] [address] [place], [place] [address]    Report Status PENDING  Incomplete  Respiratory Panel by PCR     Status: None   Collection Time: 11/19/18  2:36 AM  Result Value Ref Range Status   Adenovirus NOT DETECTED NOT DETECTED Final   Coronavirus 229E NOT DETECTED NOT DETECTED Final    Comment: (NOTE) The Coronavirus on the Respiratory Panel, DOES NOT test for the novel  Coronavirus (2019 nCoV)    Coronavirus HKU1 NOT DETECTED NOT DETECTED Final   Coronavirus NL63 NOT DETECTED NOT DETECTED Final   Coronavirus OC43 NOT DETECTED NOT DETECTED Final   Metapneumovirus NOT DETECTED NOT DETECTED Final   Rhinovirus / Enterovirus NOT DETECTED NOT DETECTED Final   Influenza A NOT DETECTED NOT DETECTED Final   Influenza B NOT DETECTED NOT DETECTED Final   Parainfluenza Virus 1 NOT DETECTED NOT DETECTED Final   Parainfluenza Virus 2 NOT DETECTED NOT DETECTED Final   Parainfluenza Virus 3 NOT DETECTED NOT DETECTED Final   Parainfluenza Virus 4 NOT DETECTED NOT DETECTED Final   Respiratory Syncytial Virus NOT DETECTED NOT DETECTED Final   Bordetella pertussis NOT DETECTED NOT DETECTED Final   Chlamydophila pneumoniae NOT DETECTED NOT DETECTED Final  Mycoplasma pneumoniae NOT DETECTED NOT DETECTED Final    Comment: Performed at [facility], [place] [address] [place], [place] [address]  Culture, sputum-assessment     Status: None   Collection Time: 11/20/18  7:40 PM  Result Value Ref Range  Status   Specimen Description SPUTUM  Final   Special Requests NONE  Final   Sputum evaluation   Final    THIS SPECIMEN IS ACCEPTABLE FOR SPUTUM CULTURE Performed at [facility], [place] [address], [place], [place] [address]    Report Status 11/20/2018 FINAL  Final  Culture, respiratory     Status: None   Collection Time: 11/20/18  7:40 PM  Result Value Ref Range Status   Specimen Description   Final    SPUTUM Performed at [facility] [address] [place], [place] [address]    Special Requests   Final    NONE Reflexed from [id number] Performed at [facility], [place] [address] [place], [place] [address]    Gram Stain   Final    MODERATE WBC PRESENT, PREDOMINANTLY PMN MODERATE GRAM POSITIVE RODS    Culture   Final    FEW Consistent with normal respiratory flora. Performed at [facility], [place] [address], [place], [place] [address]    Report Status 11/23/2018 FINAL  Final  Aerobic/Anaerobic Culture (surgical/deep wound)     Status: None (Preliminary result)   Collection Time: 11/21/18  2:33 PM  Result Value Ref Range Status   Specimen Description   Final    ABSCESS ABDOMEN Performed at [facility] [address], [place], [place] [address]    Special Requests   Final    Normal Performed at [facility], [place] [address] [place], [place] [address]    Gram Stain   Final    ABUNDANT WBC PRESENT,BOTH PMN AND MONONUCLEAR ABUNDANT GRAM VARIABLE ROD MODERATE GRAM POSITIVE COCCI IN PAIRS IN CHAINS    Culture   Final    CULTURE REINCUBATED FOR BETTER GROWTH Performed at [facility], [place] [address] [place],  [address]    Report Status PENDING  Incomplete     Liver Function Tests: Recent Labs  Lab 11/18/18 2300 11/19/18 1723 11/20/18 0910 11/23/18 0412  AST 52* 46* 53* 50*  ALT 102* 84* 85* 73*  ALKPHOS 206* 157* 157* 145*  BILITOT 1.0 0.2* 0.7 0.5  PROT  7.2 6.3* 6.5 6.4*  ALBUMIN 3.0* 2.6* 2.8* 2.4*   No results for input(s): LIPASE, AMYLASE in the last 168 hours. No results for input(s): AMMONIA in the last 168 hours.  Cardiac Enzymes: Recent Labs  Lab 11/19/18 0236 11/19/18 0548 11/19/18 1048 11/19/18 1723  TROPONINI 0.10* 0.06* 0.05* 0.05*   BNP (last 3 results) Recent Labs    11/18/18 2248  BNP 106.2*    ProBNP (last 3 results) No results for input(s): PROBNP in the last 8760 hours.    Studies: Ct Image Guided Fluid Drain By Catheter  Result Date: 11/21/2018 INDICATION: 58 year old male with postoperative (laparoscopic cholecystectomy 11/10/2018) abscess in the gallbladder fossa. He presents for percutaneous drain placement. EXAM: CT-guided drain placement MEDICATIONS: The patient is currently admitted to the hospital and receiving intravenous antibiotics. The antibiotics were administered within an appropriate time frame prior to the initiation of the procedure. ANESTHESIA/SEDATION: Fentanyl 100 mcg IV; Versed 3 mg IV Moderate Sedation Time:  20 The patient was continuously monitored during the procedure by the  interventional radiology nurse under my direct supervision. COMPLICATIONS: None immediate. PROCEDURE: Informed written consent was obtained from the patient after a thorough discussion of the procedural risks, benefits and alternatives. All questions were addressed. Maximal Sterile Barrier Technique was utilized including caps, mask, sterile gowns, sterile gloves, sterile drape, hand hygiene and skin antiseptic. A timeout was performed prior to the initiation of the procedure. A planning axial CT scan was performed. The fluid and gas collection in the gallbladder fossa was identified. A suitable skin entry site was selected and marked. The overlying skin was sterilely prepped and draped in the standard fashion using chlorhexidine skin prep. Local anesthesia was attained by infiltration with 1% lidocaine. Under intermittent  CT guidance, an 18 gauge trocar needle was advanced into the collection. An Amplatz wire was then coiled within the collection. The needle was removed and the tract dilated to 12 [place]. A Cook 12 [place] all-purpose drainage catheter modified with additional sideholes was then advanced over the wire and formed in the fluid and gas collection. Aspiration yields approximately 380 mL of foul-smelling purulent fluid. The catheter was then flushed and connected to JP bulb suction. Post placement CT imaging demonstrates a well-positioned drainage catheter in the gallbladder fossa with complete aspiration of the fluid and gas collection. IMPRESSION: 1. Placement of a 12 French drainage catheter into the gallbladder fossa fluid and gas collection with aspiration of 380 mL foul-smelling purulent fluid. A sample was sent for Gram stain and culture. PLAN: 1. Maintain drain to JP bulb suction. 2. Flush at least once per shift. 3. Once output is minimal (less than 15 mL over 24 hours) for 72 consecutive hours, the drainage catheter can be removed. If patient is discharged prior to drain removal, please arrange follow-up in interventional radiology at [facility] 1-2 weeks after the date of discharge. Signed, Criselda Peaches, MD, [facility] Electronically Signed   By: Jacqulynn Cadet M.D.   On: 11/21/2018 14:51    Scheduled Meds: . aspirin EC  81 mg Oral Daily  . docusate sodium  100 mg Oral BID  . folic acid  1 mg Oral QPC breakfast  . loratadine  10 mg Oral QPC breakfast  . metroNIDAZOLE  500 mg Oral Q8H  . nicotine  21 mg Transdermal Daily  . pantoprazole  40 mg Oral Daily  . polyethylene glycol  17 g Oral Daily  . rivaroxaban  20 mg Oral Q breakfast  . sodium chloride flush  5 mL Intracatheter Q8H    Admission status: Inpatient: Based on patients clinical presentation and evaluation of above clinical data, I have made determination  that patient meets Inpatient criteria at this time.  Time spent: 20 min  [facility] Pager [phone]. If 7PM-7AM, please contact night-coverage at [email or web address], Office  [phone]  password [email or web address]  11/23/2018, 2:04 PM  LOS: 4 days

## 2018-11-24 LAB — BASIC METABOLIC PANEL
Anion gap: 7 (ref 5–15)
BUN: 7 mg/dL (ref 6–20)
CO2: 26 mmol/L (ref 22–32)
Calcium: 7.9 mg/dL — ABNORMAL LOW (ref 8.9–10.3)
Chloride: 105 mmol/L (ref 98–111)
Creatinine, Ser: 0.64 mg/dL (ref 0.61–1.24)
GFR calc Af Amer: >60 mL/min (ref >60)
GFR calc non Af Amer: >60 mL/min (ref >60)
Glucose, Bld: 115 mg/dL — ABNORMAL HIGH (ref 70–99)
Potassium: 3.6 mmol/L (ref 3.5–5.1)
Sodium: 138 mmol/L (ref 135–145)

## 2018-11-24 LAB — AEROBIC/ANAEROBIC CULTURE W GRAM STAIN (SURGICAL/DEEP WOUND): Special Requests: NORMAL

## 2018-11-24 LAB — CULTURE, BLOOD (ROUTINE X 2)
Culture: NO GROWTH
Special Requests: ADEQUATE

## 2018-11-24 LAB — AEROBIC/ANAEROBIC CULTURE (SURGICAL/DEEP WOUND)

## 2018-11-24 MED ORDER — AMOXICILLIN-POT CLAVULANATE 875-125 MG PO TABS: 1.0000 | ORAL_TABLET | Freq: Two times a day (BID) | ORAL | 0 refills | Status: AC

## 2018-11-24 MED ORDER — SULFAMETHOXAZOLE-TRIMETHOPRIM 800-160 MG PO TABS: 1.0000 | ORAL_TABLET | Freq: Two times a day (BID) | ORAL | 0 refills | Status: DC

## 2018-11-24 MED ORDER — OXYCODONE HCL 5 MG PO TABS: 5.0000 mg | ORAL_TABLET | Freq: Four times a day (QID) | ORAL | 0 refills | Status: AC | PRN

## 2018-11-24 NOTE — Discharge Summary (Signed)
Physician Discharge Summary  Ernest Wu TGY:[id number] DOB: November 05, 1960 DOA: 11/18/2018  PCP: [name], [name], MD  Admit date: 11/18/2018 Discharge date: 11/24/2018  Time spent: 50* minutes  Recommendations for Outpatient Follow-up:  1. Follow-up general surgery in 1 week 2. Follow-up IR in 2 weeks   Discharge Diagnoses:  Principal Problem:   Acute bronchitis Active Problems:   Tobacco use   Adenocarcinoma of pancreas (HCC)   Sepsis (HCC)   Hypokalemia   Suspected Covid-19 Virus Infection   Abnormal LFTs   Discharge Condition: Stable  Diet recommendation: Heart healthy diet  Filed Weights   11/18/18 2235 11/19/18 0520  Weight: 90.7 kg 89.4 kg    History of present illness:  58 year old male with a history of pancreatic cancer on chemotherapy, tobacco abuse, GERD, anxiety came with cough, shortness of breath fever and chills.  Patient's temp was 102.4.  Also was coughing up yellow-colored phlegm.  No contact with COVID-19 patients.   Hospital Course:   1. Acute bronchitis-resolved,  chest x-ray was negative for infiltrate.  COVID-19 test came back negative.  Patient started on IV azithromycin and Rocephin.  LDH 154, lactic acid 1.2, procalcitonin 14.95.  We will continue with antibiotics at this time.C-reactive protein 23.5, d-dimer elevated 3.82, (Patient is on Xarelto) strep pneumo urinary antigen negative, urine Legionella  antigen is negative.  2. Gram-negative rod bacteremia/abscess in the gallbladder fossa-blood culture grew Hafnia species, sensitive to ceftriaxone.  Physical exam  revealed patient was found to have tenderness in right upper quadrant, he recently had laparoscopic cholecystectomy 10 days ago.  Abdominal ultrasound was obtained which showed biloma versus abscess.  IR and general surgery were consulted.  CT abdomen without contrast confirmed abscess.  Patient underwent drainage of the abscess along with placement of JP drain .    Culture from the  abscess is growing enterococcus.  Final report is pending.  Will discharge on Augmentin 875/125 1 tablet p.o. twice daily to cover for enterococcus, and Bactrim DS 1 tablet p.o. twice daily for 7 more days to cover for Hafnia species.  He will follow-up with general surgery and IR as outpatient.  3. Hypokalemia-replete  4. Adenocarcinoma of pancreas-diagnosed in November 2019, patient on chemotherapy.  Last dose was 10/16/2018.  Follow-up with Dr. Burr Medico as outpatient.    5. 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 Xarelto was held , per IR l orderedc heparin per pharmacy today.as per oncology if Xarelto is held more than 24 hours then heparin bridging is preferred due to high risk of thrombosis from metastatic pancreatic cancer and recurrent left [place] thrombosis.  Heparin was discontinued yesterday, patient started on Xarelto.  6. Transaminitis-patient has elevated liver enzymes likely from pancreatic cancer, AST 52, ALT 102, alk phos 206.     Procedures:  Drainage of the gallbladder fossa abscess with placement of JP drain  Consultations:  General surgery  IR  Discharge Exam: Vitals:   11/23/18 2036 11/24/18 0608  BP: 126/87 124/80  Pulse: 84 71  Resp: 20 17  Temp: 97.9 F (36.6 C) 98.5 F (36.9 C)  SpO2: 98% 96%    General: Appears in no acute distress Cardiovascular: S1-S2, regular Respiratory: Clear to auscultation bilaterally  Discharge Instructions   Discharge Instructions    Diet - low sodium heart healthy   Complete by:  As directed    Increase activity slowly   Complete by:  As directed      Allergies as of 11/24/2018  Reactions   Contrast Media [iodinated Diagnostic Agents] Other (See Comments)   Burning sensation [SEE CONTRAST MEDIA]   Iohexol Anaphylaxis, Itching, Rash      Medication List    STOP taking these medications   azithromycin 250 MG tablet Commonly known as:  ZITHROMAX   enoxaparin 120 MG/0.8ML  injection Commonly known as:  Lovenox   ibuprofen 200 MG tablet Commonly known as:  ADVIL     TAKE these medications   ALPRAZolam 0.25 MG tablet Commonly known as:  XANAX Take 1 tablet (0.25 mg total) by mouth at bedtime as needed for anxiety.   amoxicillin-clavulanate 875-125 MG tablet Commonly known as:  Augmentin Take 1 tablet by mouth 2 (two) times daily for 7 days.   diphenoxylate-atropine 2.5-0.025 MG tablet Commonly known as:  LOMOTIL Take 2 tablets by mouth 4 (four) times daily as needed for diarrhea or loose stools.   docusate sodium 100 MG capsule Commonly known as:  COLACE Take 200 mg by mouth daily as needed for mild constipation.   esomeprazole 20 MG capsule Commonly known as:  NEXIUM Take 20 mg by mouth daily after breakfast.   folic acid 1 MG tablet Commonly known as:  FOLVITE Take 1 mg by mouth daily after breakfast.   loratadine 10 MG tablet Commonly known as:  CLARITIN Take 10 mg by mouth daily after breakfast.   magic mouthwash w/lidocaine Soln Take 5 mLs by mouth 4 (four) times daily as needed for mouth pain.   oxyCODONE 5 MG immediate release tablet Commonly known as:  Oxy IR/ROXICODONE Take 1 tablet (5 mg total) by mouth every 6 (six) hours as needed for up to 4 days for severe pain. What changed:  Another medication with the same name was removed. Continue taking this medication, and follow the directions you see here.   rivaroxaban 20 MG Tabs tablet Commonly known as:  Xarelto Take 1 tablet (20 mg total) by mouth daily with supper. What changed:  when to take this   simethicone 125 MG chewable tablet Commonly known as:  MYLICON Chew 659 mg by mouth every 6 (six) hours as needed for flatulence.   sulfamethoxazole-trimethoprim 800-160 MG tablet Commonly known as:  BACTRIM DS Take 1 tablet by mouth 2 (two) times daily.      Allergies  Allergen Reactions  . Contrast Media [Iodinated Diagnostic Agents] Other (See Comments)    Burning  sensation [SEE CONTRAST MEDIA]  . Iohexol Anaphylaxis, Itching and Rash   Follow-up [facility] up in 2 week(s).   Why:  IR scheduler will call you with appointment date and time (typically 10-14 days for drain placement). Please call with any questions or concerns prior to your appointment. Contact information: [place] [address] [phone]        Stark Klein, MD. Go on 12/01/2018.   Specialty:  General Surgery Why:  Your appointment is 4/27 at 4:15pm. Please arrive 15 minutes early to check in. Contact information: [address] [place] [address] [phone]            The results of significant diagnostics from this hospitalization (including imaging, microbiology, ancillary and laboratory) are listed below for reference.    Significant Diagnostic Studies: Ct Abdomen Wo Contrast  Result Date: 11/20/2018 CLINICAL DATA:  Abdominal pain status post cholecystectomy. EXAM: CT ABDOMEN WITHOUT CONTRAST TECHNIQUE: Multidetector CT imaging of the abdomen was performed following the standard protocol without  IV contrast. COMPARISON:  Ultrasound of same day.  CT scan of June 07, 2018. FINDINGS: Lower chest: No acute abnormality. Hepatobiliary: Status post cholecystectomy. There is now noted 9.4 x 8.4 cm air-fluid collection with surrounding inflammation seen in the gallbladder fossa concerning for possible abscess or biloma. Pneumobilia is noted in left hepatic lobe. No definite intrahepatic biliary dilatation is noted. Pancreas: Large stent is seen in distal common bile duct extending into duodenum for treatment of pancreatic head mass noted on prior studies. Spleen: Normal in size without focal abnormality. Adrenals/Urinary Tract: Adrenal glands and kidneys appear normal. No hydronephrosis or renal obstruction is noted. No renal calculi are noted. Stomach/Bowel: The stomach appears normal. There is  no evidence of bowel obstruction. Vascular/Lymphatic: Aortic atherosclerosis. No enlarged abdominal lymph nodes. Other: No abdominal wall hernia or abnormality. Musculoskeletal: No acute or significant osseous findings. IMPRESSION: 9.4 x 8.4 cm air-fluid collection is noted in gallbladder fossa status post cholecystectomy, concerning for possible abscess or biloma. These results will be called to the ordering clinician or representative by the Radiologist Assistant, and communication documented in the PACS or zVision Dashboard. Large stent is noted in distal common bile duct extending into duodenum for treatment of pancreatic head mass. Electronically Signed   By: Marijo Conception M.D.   On: 11/20/2018 15:58   Dg Chest 2 View  Result Date: 11/17/2018 CLINICAL DATA:  Shortness of breath, fever, and bilateral rib pain. History of smoking and pancreatic cancer. EXAM: CHEST - 2 VIEW COMPARISON:  07/18/2018 FINDINGS: Left subclavian Port-A-Cath has been removed. The cardiomediastinal silhouette is within normal limits. The lungs are mildly hyperinflated with peribronchial thickening. No confluent airspace opacity, overt pulmonary edema, pleural effusion, pneumothorax is identified. No acute osseous abnormality is seen. IMPRESSION: Bronchitic changes. Electronically Signed   By: Logan Bores M.D.   On: 11/17/2018 13:24   US Abdomen Complete  Result Date: 11/20/2018 CLINICAL DATA:  Right upper quadrant abdominal pain. EXAM: ABDOMEN ULTRASOUND COMPLETE COMPARISON:  MRI of October 24, 2018. FINDINGS: Gallbladder: Status post cholecystectomy. Complex rounded abnormality measuring 9.8 x 7.8 cm is seen in the gallbladder fossa which may represent hematoma, biloma or possible abscess. Common bile duct: Diameter: 4 mm which is within normal limits. Liver: No focal lesion identified. Within normal limits in parenchymal echogenicity. Portal vein is patent on color Doppler imaging with normal direction of blood flow towards the  liver. IVC: No abnormality visualized. Pancreas: Irregular hypoechoic abnormality measuring 4.0 x 3.5 x 1.8 cm is seen in the pancreatic head consistent with a history of pancreatic malignancy. No ductal dilatation is noted. Spleen: Spleen measures 12.4 x 12.8 x 6.5 cm with calculated volume of 530 cubic cm, consistent with mild splenomegaly. No focal abnormality is noted. Right Kidney: Length: 12.7 cm. Echogenicity within normal limits. No mass or hydronephrosis visualized. Left Kidney: Length: 12.9 cm. Echogenicity within normal limits. No mass or hydronephrosis visualized. Abdominal aorta: No aneurysm visualized. Other findings: None. IMPRESSION: Complex rounded abnormality seen in gallbladder fossa status post recent cholecystectomy. This is concerning for possible biloma, abscess or hematoma. Further evaluation with CT scan of the abdomen is recommended. These results will be called to the ordering clinician or representative by the Radiologist Assistant, and communication documented in the PACS or zVision Dashboard. 4.0 cm irregular hypoechoic abnormality seen in pancreatic head consistent with history of pancreatic neoplasm. Mild splenomegaly. Electronically Signed   By: Marijo Conception M.D.   On: 11/20/2018 13:32   Dg Chest Port 1  View  Result Date: 11/19/2018 CLINICAL DATA:  58 y/o  M; cough and fever. EXAM: PORTABLE CHEST 1 VIEW COMPARISON:  11/17/2018 chest radiograph. FINDINGS: Stable normal cardiac silhouette given projection and technique. Stable bronchitic changes in the lung bases. No new consolidation, effusion, or pneumothorax. Bones are unremarkable. IMPRESSION: Stable bronchitic changes in lung bases. No new consolidation. Electronically Signed   By: Kristine Garbe M.D.   On: 11/19/2018 00:26   Ct Image Guided Fluid Drain By Catheter  Result Date: 11/21/2018 INDICATION: 58 year old male with postoperative (laparoscopic cholecystectomy 11/10/2018) abscess in the gallbladder  fossa. He presents for percutaneous drain placement. EXAM: CT-guided drain placement MEDICATIONS: The patient is currently admitted to the hospital and receiving intravenous antibiotics. The antibiotics were administered within an appropriate time frame prior to the initiation of the procedure. ANESTHESIA/SEDATION: Fentanyl 100 mcg IV; Versed 3 mg IV Moderate Sedation Time:  20 The patient was continuously monitored during the procedure by the interventional radiology nurse under my direct supervision. COMPLICATIONS: None immediate. PROCEDURE: Informed written consent was obtained from the patient after a thorough discussion of the procedural risks, benefits and alternatives. All questions were addressed. Maximal Sterile Barrier Technique was utilized including caps, mask, sterile gowns, sterile gloves, sterile drape, hand hygiene and skin antiseptic. A timeout was performed prior to the initiation of the procedure. A planning axial CT scan was performed. The fluid and gas collection in the gallbladder fossa was identified. A suitable skin entry site was selected and marked. The overlying skin was sterilely prepped and draped in the standard fashion using chlorhexidine skin prep. Local anesthesia was attained by infiltration with 1% lidocaine. Under intermittent CT guidance, an 18 gauge trocar needle was advanced into the collection. An Amplatz wire was then coiled within the collection. The needle was removed and the tract dilated to 12 [place]. A Cook 12 [place] all-purpose drainage catheter modified with additional sideholes was then advanced over the wire and formed in the fluid and gas collection. Aspiration yields approximately 380 mL of foul-smelling purulent fluid. The catheter was then flushed and connected to JP bulb suction. Post placement CT imaging demonstrates a well-positioned drainage catheter in the gallbladder fossa with complete aspiration of the fluid and gas collection. IMPRESSION: 1. Placement of  a 12 French drainage catheter into the gallbladder fossa fluid and gas collection with aspiration of 380 mL foul-smelling purulent fluid. A sample was sent for Gram stain and culture. PLAN: 1. Maintain drain to JP bulb suction. 2. Flush at least once per shift. 3. Once output is minimal (less than 15 mL over 24 hours) for 72 consecutive hours, the drainage catheter can be removed. If patient is discharged prior to drain removal, please arrange follow-up in interventional radiology at [facility] 1-2 weeks after the date of discharge. Signed, Criselda Peaches, MD, [facility] Radiology Electronically Signed   By: Jacqulynn Cadet M.D.   On: 11/21/2018 14:51   Nm Hepato Biliary Leak  Result Date: 11/20/2018 CLINICAL DATA:  Cholecystectomy on 11/10/2018. Mid to low back pain for 6 days with fever. EXAM: NUCLEAR MEDICINE HEPATOBILIARY IMAGING TECHNIQUE: Sequential images of the abdomen were obtained out to 60 minutes following intravenous administration of radiopharmaceutical. RADIOPHARMACEUTICALS:  5.3 mCi Tc-57m Choletec IV COMPARISON:  CT scan from earlier today. FINDINGS: Brisk uptake of radiotracer by the hepatic parenchyma noted with clearance of blood pool activity by 5-10 minutes. Biliary activity is visible by 10 minutes and gut activity is visible  by 10-15 minutes. No accumulation of radiotracer in the gallbladder fossa fluid collection seen on previous CT. No accumulation of radiotracer along the surface of the liver. No evidence for pooling of radiotracer in the peritoneal cavity. IMPRESSION: 1. Normal hepatobiliary patency study. 2. No evidence for bile leak. Electronically Signed   By: Misty Stanley M.D.   On: 11/20/2018 18:13    Microbiology: Recent Results (from the past 240 hour(s))  Blood Culture (routine x 2)     Status: Abnormal   Collection Time: 11/18/18 10:48 PM  Result Value Ref Range Status   Specimen Description    Final    BLOOD LEFT ANTECUBITAL Performed at [facility] [address] [place], [place] [address]    Special Requests   Final    BOTTLES DRAWN AEROBIC AND ANAEROBIC Blood Culture adequate volume Performed at [facility] [address], [place], [place] [address]    Culture  Setup Time   Final    GRAM NEGATIVE RODS ANAEROBIC BOTTLE ONLY CRITICAL RESULT CALLED TO, READ BACK BY AND VERIFIED WITH: Shelda Jakes PharmD 17:10 11/19/18 ([email or web address])    Culture HAFNIA SPECIES (A)  Final   Report Status 11/22/2018 FINAL  Final   Organism ID, Bacteria HAFNIA SPECIES  Final      Susceptibility   Hafnia species - MIC*    AMPICILLIN RESISTANT Resistant     CEFAZOLIN >=64 RESISTANT Resistant     CEFEPIME <=1 SENSITIVE Sensitive     CEFTAZIDIME 2 SENSITIVE Sensitive     CEFTRIAXONE <=1 SENSITIVE Sensitive     CIPROFLOXACIN <=0.25 SENSITIVE Sensitive     GENTAMICIN <=1 SENSITIVE Sensitive     IMIPENEM 0.5 SENSITIVE Sensitive     TRIMETH/SULFA <=20 SENSITIVE Sensitive     AMPICILLIN/SULBACTAM >=32 RESISTANT Resistant     PIP/TAZO 64 INTERMEDIATE Intermediate     * HAFNIA SPECIES  Blood Culture ID Panel (Reflexed)     Status: None   Collection Time: 11/18/18 10:48 PM  Result Value Ref Range Status   Enterococcus species NOT DETECTED NOT DETECTED Final   Listeria monocytogenes NOT DETECTED NOT DETECTED Final   Staphylococcus species NOT DETECTED NOT DETECTED Final   Staphylococcus aureus (BCID) NOT DETECTED NOT DETECTED Final   Streptococcus species NOT DETECTED NOT DETECTED Final   Streptococcus agalactiae NOT DETECTED NOT DETECTED Final   Streptococcus pneumoniae NOT DETECTED NOT DETECTED Final   Streptococcus pyogenes NOT DETECTED NOT DETECTED Final   Acinetobacter baumannii NOT DETECTED NOT DETECTED Final   Enterobacteriaceae species NOT DETECTED NOT DETECTED Final   Enterobacter cloacae complex NOT DETECTED NOT DETECTED Final   Escherichia coli NOT  DETECTED NOT DETECTED Final   Klebsiella oxytoca NOT DETECTED NOT DETECTED Final   Klebsiella pneumoniae NOT DETECTED NOT DETECTED Final   Proteus species NOT DETECTED NOT DETECTED Final   Serratia marcescens NOT DETECTED NOT DETECTED Final   Haemophilus influenzae NOT DETECTED NOT DETECTED Final   Neisseria meningitidis NOT DETECTED NOT DETECTED Final   Pseudomonas aeruginosa NOT DETECTED NOT DETECTED Final   Candida albicans NOT DETECTED NOT DETECTED Final   Candida glabrata NOT DETECTED NOT DETECTED Final   Candida krusei NOT DETECTED NOT DETECTED Final   Candida parapsilosis NOT DETECTED NOT DETECTED Final   Candida tropicalis NOT DETECTED NOT DETECTED Final    Comment: Performed at [facility], [address], [place], [place] [address]  SARS Coronavirus 2 [facility] order, Performed in [facility] hospital lab)  Status: None   Collection Time: 11/18/18 11:12 PM  Result Value Ref Range Status   SARS Coronavirus 2 NEGATIVE NEGATIVE Final    Comment: (NOTE) If result is NEGATIVE SARS-CoV-2 target nucleic acids are NOT DETECTED. The SARS-CoV-2 RNA is generally detectable in upper and lower  respiratory specimens during the acute phase of infection. The lowest  concentration of SARS-CoV-2 viral copies this assay can detect is 250  copies / mL. A negative result does not preclude SARS-CoV-2 infection  and should not be used as the sole basis for treatment or other  patient management decisions.  A negative result may occur with  improper specimen collection / handling, submission of specimen other  than nasopharyngeal swab, presence of viral mutation(s) within the  areas targeted by this assay, and inadequate number of viral copies  (<250 copies / mL). A negative result must be combined with clinical  observations, patient history, and epidemiological information. If result is POSITIVE SARS-CoV-2 target nucleic acids are DETECTED. The SARS-CoV-2 RNA is generally detectable  in upper and lower  respiratory specimens dur ing the acute phase of infection.  Positive  results are indicative of active infection with SARS-CoV-2.  Clinical  correlation with patient history and other diagnostic information is  necessary to determine patient infection status.  Positive results do  not rule out bacterial infection or co-infection with other viruses. If result is PRESUMPTIVE POSTIVE SARS-CoV-2 nucleic acids MAY BE PRESENT.   A presumptive positive result was obtained on the submitted specimen  and confirmed on repeat testing.  While 2019 novel coronavirus  (SARS-CoV-2) nucleic acids may be present in the submitted sample  additional confirmatory testing may be necessary for epidemiological  and / or clinical management purposes  to differentiate between  SARS-CoV-2 and other Sarbecovirus currently known to infect humans.  If clinically indicated additional testing with an alternate test  methodology [phone]) is advised. The SARS-CoV-2 RNA is generally  detectable in upper and lower respiratory sp ecimens during the acute  phase of infection. The expected result is Negative. Fact Sheet for Patients:  StrictlyIdeas.no Fact Sheet for Healthcare Providers: BankingDealers.co.za This test is not yet approved or cleared by the [place] FDA and has been authorized for detection and/or diagnosis of SARS-CoV-2 by FDA under an Emergency Use Authorization (EUA).  This EUA will remain in effect (meaning this test can be used) for the duration of the COVID-19 declaration under Section 564(b)(1) of the Act, 21 U.S.C. section 360bbb-3(b)(1), unless the authorization is terminated or revoked sooner. Performed at [facility], [place] [address], [place], [place] [address]   Blood Culture (routine x 2)     Status: None   Collection Time: 11/18/18 11:26 PM  Result Value Ref Range Status   Specimen Description   Final    BLOOD  BLOOD LEFT FOREARM Performed at [facility] [address] [place], [place] [address]    Special Requests   Final    BOTTLES DRAWN AEROBIC AND ANAEROBIC Blood Culture adequate volume Performed at [facility] [address], [place], [place] [address]    Culture   Final    NO GROWTH 5 DAYS Performed at [facility], [place] [address] [place], [place] [address]    Report Status 11/24/2018 FINAL  Final  Respiratory Panel by PCR     Status: None   Collection Time: 11/19/18  2:36 AM  Result Value Ref Range Status   Adenovirus NOT DETECTED  NOT DETECTED Final   Coronavirus 229E NOT DETECTED NOT DETECTED Final    Comment: (NOTE) The Coronavirus on the Respiratory Panel, DOES NOT test for the novel  Coronavirus (2019 nCoV)    Coronavirus HKU1 NOT DETECTED NOT DETECTED Final   Coronavirus NL63 NOT DETECTED NOT DETECTED Final   Coronavirus OC43 NOT DETECTED NOT DETECTED Final   Metapneumovirus NOT DETECTED NOT DETECTED Final   Rhinovirus / Enterovirus NOT DETECTED NOT DETECTED Final   Influenza A NOT DETECTED NOT DETECTED Final   Influenza B NOT DETECTED NOT DETECTED Final   Parainfluenza Virus 1 NOT DETECTED NOT DETECTED Final   Parainfluenza Virus 2 NOT DETECTED NOT DETECTED Final   Parainfluenza Virus 3 NOT DETECTED NOT DETECTED Final   Parainfluenza Virus 4 NOT DETECTED NOT DETECTED Final   Respiratory Syncytial Virus NOT DETECTED NOT DETECTED Final   Bordetella pertussis NOT DETECTED NOT DETECTED Final   Chlamydophila pneumoniae NOT DETECTED NOT DETECTED Final   Mycoplasma pneumoniae NOT DETECTED NOT DETECTED Final    Comment: Performed at [facility], [place] [address] [place], [place] [address]  Culture, sputum-assessment     Status: None   Collection Time: 11/20/18  7:40 PM  Result Value Ref Range Status   Specimen Description SPUTUM  Final   Special Requests NONE  Final   Sputum evaluation   Final    THIS SPECIMEN IS  ACCEPTABLE FOR SPUTUM CULTURE Performed at [facility], [place] [address] [place], [place] [address]    Report Status 11/20/2018 FINAL  Final  Culture, respiratory     Status: None   Collection Time: 11/20/18  7:40 PM  Result Value Ref Range Status   Specimen Description   Final    SPUTUM Performed at [facility] [address], [place], [place] [address]    Special Requests   Final    NONE Reflexed from [id number] Performed at [facility], [place] [address] [place], [place] [address]    Gram Stain   Final    MODERATE WBC PRESENT, PREDOMINANTLY PMN MODERATE GRAM POSITIVE RODS    Culture   Final    FEW Consistent with normal respiratory flora. Performed at [facility], [place] [address], [place], [place] [address]    Report Status 11/23/2018 FINAL  Final  Aerobic/Anaerobic Culture (surgical/deep wound)     Status: Abnormal   Collection Time: 11/21/18  2:33 PM  Result Value Ref Range Status   Specimen Description   Final    ABSCESS ABDOMEN Performed at [facility] [address], [place], [place] [address]    Special Requests   Final    Normal Performed at [facility], [place] [address] [place], [place] [address]    Gram Stain   Final    ABUNDANT WBC PRESENT,BOTH PMN AND MONONUCLEAR ABUNDANT GRAM VARIABLE ROD MODERATE GRAM POSITIVE COCCI IN PAIRS IN CHAINS    Culture (A)  Final    MULTIPLE ORGANISMS PRESENT, NONE PREDOMINANT NO ANAEROBES ISOLATED Performed at [facility], [place] [address], [place],  [address]    Report Status 11/24/2018 FINAL  Final   Organism ID, Bacteria MULTIPLE ORGANISMS PRESENT, NONE PREDOMINANT  Final      Susceptibility   Multiple organisms present, none predominant - MIC*    AMPICILLIN <=2 SENSITIVE Sensitive     VANCOMYCIN 1 SENSITIVE Sensitive     GENTAMICIN SYNERGY SENSITIVE Sensitive     *  MULTIPLE ORGANISMS PRESENT, NONE PREDOMINANT      Labs: Basic Metabolic Panel: Recent Labs  Lab 11/19/18 0548 11/19/18 1723 11/20/18 0910 11/21/18 0453 11/23/18 0412 11/23/18 1505 11/24/18 0434  NA  --  136 135 135 137  --  138  K  --  3.3* 3.3* 3.7 2.9*  --  3.6  CL  --  107 105 104 102  --  105  CO2  --  21* 22 22 26   --  26  GLUCOSE  --  115* 109* 116* 130*  --  115*  BUN  --  10 9 7 10   --  7  CREATININE  --  0.71 0.76 0.73 0.83  --  0.64  CALCIUM  --  7.6* 8.2* 8.2* 7.9*  --  7.9*  MG 2.0  --   --   --   --  2.4  --    Liver Function Tests: Recent Labs  Lab 11/18/18 2300 11/19/18 1723 11/20/18 0910 11/23/18 0412  AST 52* 46* 53* 50*  ALT 102* 84* 85* 73*  ALKPHOS 206* 157* 157* 145*  BILITOT 1.0 0.2* 0.7 0.5  PROT 7.2 6.3* 6.5 6.4*  ALBUMIN 3.0* 2.6* 2.8* 2.4*   No results for input(s): LIPASE, AMYLASE in the last 168 hours. No results for input(s): AMMONIA in the last 168 hours. CBC: Recent Labs  Lab 11/18/18 2300 11/19/18 0236 11/20/18 0910 11/21/18 0453 11/22/18 0841  WBC 3.0*  --  8.5 7.9 8.9  NEUTROABS 2.8  --   --   --   --   HGB 11.9* 10.5* 10.5* 10.4* 11.5*  HCT 36.9*  --  32.6* 32.2* 36.3*  MCV 97.9  --  98.8 97.6 96.8  PLT 158  --  158 183 226   Cardiac Enzymes: Recent Labs  Lab 11/19/18 0236 11/19/18 0548 11/19/18 1048 11/19/18 1723  TROPONINI 0.10* 0.06* 0.05* 0.05*   BNP: BNP (last 3 results) Recent Labs    11/18/18 2248  BNP 106.2*    ProBNP (last 3 results) No results for input(s): PROBNP in the last 8760 hours.  CBG: No results for input(s): GLUCAP in the last 168 hours.     Signed:  Oswald Hillock MD.  [facility] 11/24/2018, 12:29 PM

## 2018-11-24 NOTE — Progress Notes (Signed)
Ernest Creek, RN went over all discharge information with patient.  Verbal order from surgery to discharge with jp bulb.  Patient teaching done for care of JP bub All questions answered.  Pt wheeled out via NT.

## 2018-11-24 NOTE — Progress Notes (Signed)
[facility]:  [phone] General Surgery Progress Note   LOS: 5 days  POD -     Chief Complaint: Abdominal pain  Assessment and Plan: 1.  Gall bladder fossa abscess  Perc drain - 11/21/2018 - [place]  Lap chole - 11/10/2018 - [place]  On Rocephin/Flagyl - will need to continue antibiotics at home  Looks good - okay to go home from our standpoint - will need follow up with Dr. Barry Dienes in about one week.  2.  Pancreatic cancer  Metastatic  Sees Dr. Ky Barban 3.  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  Feels much better.  He has emptied the drain himself.  I spoke to the wife on the phone.  Objective:   Vitals:   11/23/18 2036 11/24/18 0608  BP: 126/87 124/80  Pulse: 84 71  Resp: 20 17  Temp: 97.9 F (36.6 C) 98.5 F (36.9 C)  SpO2: 98% 96%     Intake/Output from previous day:  04/19 0701 - 04/20 0700 In: 2449.2 [P.O.:360; I.V.:1694.7; IV Piggyback:379.5] Out: 245 [Urine:200; Drains:45]  Intake/Output this shift:  No intake/output data recorded.   Physical Exam:   General: WN WM who is alert and oriented.    HEENT: Normal. Pupils equal. .   Lungs: Clear.   Abdomen: Soft   Wound: Drain out of RUQ.   Lab Results:    Recent Labs    11/22/18 0841  WBC 8.9  HGB 11.5*  HCT 36.3*  PLT 226    BMET   Recent Labs    11/23/18 0412 11/24/18 0434  NA 137 138  K 2.9* 3.6  CL 102 105  CO2 26 26  GLUCOSE 130* 115*  BUN 10 7  CREATININE 0.83 0.64  CALCIUM 7.9* 7.9*    PT/INR  No results for input(s): LABPROT, INR in the last 72 hours.  ABG  No results for input(s): PHART, HCO3 in the last 72 hours.  Invalid input(s): PCO2, PO2   Studies/Results:  No results  found.   Anti-infectives:   Anti-infectives (From admission, onward)   Start     Dose/Rate Route Frequency Ordered Stop   11/22/18 1400  metroNIDAZOLE (FLAGYL) tablet 500 mg     500 mg Oral Every 8 hours 11/22/18 1128     11/20/18 1400  metroNIDAZOLE (FLAGYL) IVPB 500 mg  Status:  Discontinued     500 mg 100 mL/hr over 60 Minutes Intravenous Every 8 hours 11/20/18 1107 11/22/18 1128   11/19/18 2300  azithromycin (ZITHROMAX) 500 mg in sodium chloride 0.9 % 250 mL IVPB  Status:  Discontinued     500 mg 250 mL/hr over 60 Minutes Intravenous Every 24 hours 11/19/18 0204 11/20/18 1107   11/19/18 2200  cefTRIAXone (ROCEPHIN) 1 g in sodium chloride 0.9 % 100 mL IVPB  Status:  Discontinued     1 g 200 mL/hr over 30 Minutes Intravenous Every 24 hours 11/19/18 0204 11/19/18 1719   11/19/18 1800  cefTRIAXone (ROCEPHIN) 2 g in sodium chloride 0.9 % 100 mL IVPB     2 g 200 mL/hr over 30 Minutes Intravenous Every 24 hours 11/19/18 1719     11/18/18 2300  cefTRIAXone (ROCEPHIN) 1 g in sodium chloride 0.9 % 100  mL IVPB     1 g 200 mL/hr over 30 Minutes Intravenous  Once 11/18/18 2252 11/18/18 2348   11/18/18 2300  azithromycin (ZITHROMAX) 500 mg in sodium chloride 0.9 % 250 mL IVPB     500 mg 250 mL/hr over 60 Minutes Intravenous  Once 11/18/18 2252 11/19/18 0043      Alphonsa Overall, MD, FACS Pager: [facility]: [phone] 11/24/2018

## 2018-11-25 ENCOUNTER — Other Ambulatory Visit: Payer: Self-pay | Admitting: General Surgery

## 2018-11-25 DIAGNOSIS — L0291 Cutaneous abscess, unspecified: Secondary | ICD-10-CM

## 2018-11-26 ENCOUNTER — Telehealth: Payer: Self-pay | Admitting: Hematology

## 2018-11-26 NOTE — Telephone Encounter (Signed)
Per 4/22 schedule message move all future appointments by 3-4 days. Patient appointments are weekly on Mondays. Message to [place] asking if appointments are to be moved from Mondays to Thursday or just skip a week.   Per response from YF either is fine, whichever patient prefers. Spoke with patient and cancelled 4/27 appointments patient will resume at next appointment 5/4.

## 2018-11-27 ENCOUNTER — Other Ambulatory Visit: Payer: Self-pay | Admitting: Hematology

## 2018-11-27 DIAGNOSIS — C259 Malignant neoplasm of pancreas, unspecified: Secondary | ICD-10-CM

## 2018-11-27 NOTE — Progress Notes (Unsigned)
genetoc

## 2018-12-01 ENCOUNTER — Encounter (HOSPITAL_COMMUNITY): Payer: Self-pay | Admitting: Hematology

## 2018-12-01 ENCOUNTER — Inpatient Hospital Stay: Payer: Managed Care, Other (non HMO)

## 2018-12-01 ENCOUNTER — Inpatient Hospital Stay: Payer: Managed Care, Other (non HMO) | Admitting: Hematology

## 2018-12-02 ENCOUNTER — Telehealth: Payer: Self-pay | Admitting: *Deleted

## 2018-12-02 NOTE — Telephone Encounter (Signed)
TCT patient/patient wife. Spoke with pt's wife. Informed her that pt would not be chemo on 12/08/18 per recommendation of both Dr. Barry Dienes and Dr. Burr Medico.  Wife agreed that he is not quite ready for chemo after this last surgery.  He is drinking fluids well but food intake is down . He is having issues with constipation. He is taking colace, miralax and mag citrate. He did have a medium pasty stool today and will take the rest of the mag citrate tonight. She wants to keep infusion appt on Monday for IV fluids (just in case) No other questions or concerns.

## 2018-12-04 ENCOUNTER — Telehealth: Payer: Self-pay

## 2018-12-04 ENCOUNTER — Other Ambulatory Visit: Payer: Self-pay | Admitting: Hematology

## 2018-12-04 ENCOUNTER — Other Ambulatory Visit: Payer: Self-pay | Admitting: Nurse Practitioner

## 2018-12-04 DIAGNOSIS — C259 Malignant neoplasm of pancreas, unspecified: Secondary | ICD-10-CM

## 2018-12-04 MED ORDER — SYRINGE 10-12 ML 12 ML MISC: 10.0000 mL | 1 refills | Status: DC | PRN

## 2018-12-04 NOTE — Telephone Encounter (Signed)
Patient's wife calls stating running out of syringes to flush his drains, Cira Rue NP sent in a script for one box of 25 - 10 cc syringes.

## 2018-12-05 NOTE — Progress Notes (Signed)
[place]   [phone] [phone]   Clinic Follow up Note   Patient Care Team: Street, [name], MD as PCP - General (Family Medicine)  Date of Service:  12/08/2018  CHIEF COMPLAINT: F/u pancreatic cancer  SUMMARY OF ONCOLOGIC HISTORY: Oncology History   Cancer Staging Adenocarcinoma of pancreas [facility]) Staging form: Exocrine Pancreas, AJCC 8th Edition - Clinical stage from 06/10/2018: Stage IB (cT2, cN0, cM0) - Signed by [name], MD on 06/29/2018       Adenocarcinoma of pancreas (Ernest Wu)   06/07/2018 Imaging    CT AP w Contrast IMPRESSION: 1. There is a low attenuation mass centered around the neck of pancreas which is concerning for neoplasm. Pancreatic adenocarcinoma favored. This results in common bile duct obstruction with mild intrahepatic biliary ductal dilatation. There also is involvement of the portal venous confluence. Further evaluation with nonemergent contrast enhanced MRI of the pancreas is recommended. 2. Small indeterminate low-attenuation structure is noted within segment 4 a of the liver. This could be better addressed at MRI.    06/09/2018 Imaging    MR ABD MRCP  The pancreatic mass involves approximately 40 percent of the main portal vein circumference at the portal splenic venous confluence, with associated mild narrowing of the portal splenic venous confluence. The SMV, splenic vein and main, right and left portal veins remain patent. The celiac trunk and SMA are not involved by the pancreatic mass.  IMPRESSION: 2.3 cm diameter hypoenhancing mass at the junction of the head and neck of pancreas with pancreatic ductal dilatation, likely adenocarcinoma.  Intra and extrahepatic bile duct dilatation with abrupt change in caliber at the mid common bile duct. This could indicate an obstructing mass or stricture.     06/10/2018 Initial Biopsy    Diagnosis PANCREAS, FINE NEEDLE ASPIRATION (SPECIMEN 1 OF 1 COLLECTED 06/10/18):  MALIGNANT CELLS CONSISTENT WITH ADENOCARCINOMA.    06/10/2018 Procedure    IMPRESSION: 1. 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 2. Placement of a metallic biliary stent.    06/10/2018 Procedure    EUS per Dr. Paulita Fujita Impression:  - There was dilation in the common bile duct which measured up to 12 mm. - A mass was identified in the pancreatic head. This was staged T3 N1 Mx by endosonographic criteria. Fine needle aspiration performed. - A few lymph nodes were visualized and measured in the peripancreatic region. - There was no evidence of significant pathology in the left lobe of the liver.    06/10/2018 Cancer Staging    Staging form: Exocrine Pancreas, AJCC 8th Edition - Clinical stage from 06/10/2018: Stage IB (cT2, cN0, cM0) - Signed by [name], MD on 06/29/2018    06/27/2018 Initial Diagnosis    Adenocarcinoma of pancreas ([place])    07/02/2018 Imaging    IMPRESSION: 1. Multiple small pulmonary nodules scattered throughout the lungs measuring up to 7 mm in size. These are nonspecific, but the possibility of metastatic disease should be considered, and close attention at time of routine followups is recommended. 2. In addition, today's study demonstrates new and enlarging low-attenuation lesions in the liver. This is poorly evaluated on today's noncontrast CT examination, but is concerning for potential metastatic disease. Further evaluation with repeat nonemergent MRI of the abdomen with and without IV gadolinium is suggested in the near future to better evaluate these findings. 3. Aortic atherosclerosis, in addition to left main and 2 vessel coronary artery disease. Please note  that although the presence of coronary artery calcium documents the presence of coronary artery disease, the severity of this disease and any potential stenosis cannot be assessed on this non-gated CT examination. Assessment for potential risk  factor modification, dietary therapy or pharmacologic therapy may be warranted, if clinically indicated. 4. Mild aneurysmal dilatation of the ascending thoracic aorta (4.8 cm in diameter). Ascending thoracic aortic aneurysm. Recommend semi-annual imaging followup by CTA or MRA and referral to cardiothoracic surgery if not already obtained. This recommendation follows 2010 ACCF/AHA/AATS/ACR/ASA/SCA/SCAI/SIR/STS/SVM Guidelines for the Diagnosis and Management of Patients With Thoracic Aortic Disease. Circulation. 2010; 121: Q008-Q761.  Aortic Atherosclerosis (ICD10-I70.0). Aortic aneurysm NOS (ICD10-I71.9).    07/04/2018 - 10/16/2018 Chemotherapy     FOLFIRINOX q2 weeks    07/15/2018 Imaging    07/15/2018 Liver US IMPRESSION: No liver lesions identified with ultrasound. Ultrasound-guided biopsy was not performed. Recommend further characterization for liver lesions with a repeat MRI, with and without contrast.    07/24/2018 Imaging    07/24/2018 MRI Abdomen IMPRESSION: 1. The hypoenhancing mass at the junction of the pancreatic body and head has reduced in size, previously 3.2 by 2.8 cm and currently 2.9 by 2.2 cm. A small peripancreatic lymph node adjacent to the mass was previously 0.9 cm in short axis and is currently 0.7 cm in short axis. The amount of contact between the pancreatic mass and the confluence of the splenic vein and SMV is similar to the prior exam. 2. There is a 6 mm probable hemangioma in segment 4a of the liver, based on the delayed enhancement pattern. This is somewhat ill-defined. The lesion appeared larger on the prior noncontrast CT but presumably may have been overestimated on that exam. There is also some hypodensity along the dome of the right hepatic lobe which appears to most likely be due to a slip of the diaphragm rather than a discernible lesion on MRI. 3. Aortic Atherosclerosis (ICD10-I70.0). Notably, there is a small amount of mural thrombus  along the right side of the abdominal aorta below the right renal artery level which has not been seen previously.    09/03/2018 Imaging    09/03/2018 MRI Abdomen IMPRESSION: 1. Stable mass (adenocarcinoma) in the neck of the pancreas with upstream duct dilatation. 2. No evidence of lymphadenopathy in the porta hepatis or peripancreatic fat. 3. No evidence hepatic metastasis. 4. Mild biliary duct dilatation LEFT hepatic lobe similar to comparison exam. Biliary stent within the common bile duct.    09/16/2018 Imaging    MR MRA CHEST W WO CONTRAST  IMPRESSION: VASCULAR  1. 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 2. Left subclavian port catheter to the SVC. 3. SVC is patent      10/24/2018 Imaging    MRI abdomen  IMPRESSION: 1. Slight interval decrease in size of the pancreatic mass. 2. Stable to slightly smaller adjacent lymph nodes. 3. No findings for metastatic disease involving the liver or lung bases. 4. Common bile duct stent in good position without complicating features.    11/10/2018 Surgery    PAC removal, Laparoscopic Cholecystectomy and Diagnostic Laparoscopic Liver Biopsy by Dre. Byerly  and Dr. Lucia Gaskins  11/10/18     11/10/2018 Pathology Results    Diagnosis 11/10/18 1. Liver, biopsy, Left - ADENOCARCINOMA. - SEE COMMENT. 2. Gallbladder - CHRONIC CHOLECYSTITIS WITH CHOLELITHIASIS. - ONE MORPHOLOGICALLY BENIGN LYMPH NODE.    12/15/2018 -  Chemotherapy    PENDING second-line chemo with Gemcitabine and Abraxane 2-3  weeks on/1 week off starting in 3 weeks        CURRENT THERAPY:  PENDING second-line chemo with Gemcitabine and Abraxane 2-3 weeks on/1 week off     INTERVAL HISTORY:  MANPREET STREY is here for a follow up post hospitalization. He was admitted to ED on 11/18/18 for sepsis and acute bronchitis. His COVID-19 test was negative. His sepsis was from a bacteremia in the gallbladder. He had his  abscess drained and had a JP tube placed.   He presents to the clinic today by himself. He notes he sis doing well and still has draining tube in place. He has not needed to empty in 2 days. He denies pain at site, but notes itching. He denies any fever. He notes appetite is better, now eating 4 times a day. He drinks 5-6 bottles a day. He notes his bowel movements are daily and regular. He denies issues with urination and denies LE edema. He would like to get drain tube removed before proceed with more chemo.     REVIEW OF SYSTEMS:   Constitutional: Denies fevers, chills or abnormal weight loss (+) Appetite improved Eyes: Denies blurriness of vision Ears, nose, mouth, throat, and face: Denies mucositis or sore throat Respiratory: Denies cough, dyspnea or wheezes Cardiovascular: Denies palpitation, chest discomfort or lower extremity swelling Gastrointestinal:  Denies nausea, heartburn or change in bowel habits (+) JP drain in place, itching at site  Skin: Denies abnormal skin rashes Lymphatics: Denies new lymphadenopathy or easy bruising Neurological:Denies numbness, tingling or new weaknesses Behavioral/Psych: Mood is stable, no new changes  All other systems were reviewed with the patient and are negative.  MEDICAL HISTORY:  Past Medical History:  Diagnosis Date  . Anxiety   . Cancer ([place]) 06/2018   Pancreatic  . Family history of breast cancer   . Family history of colonic polyps   . Family history of pancreatic cancer   . Family history of prostate cancer   . GERD (gastroesophageal reflux disease)     SURGICAL HISTORY: Past Surgical History:  Procedure Laterality Date  . BILIARY STENT PLACEMENT  06/10/2018   Procedure: BILIARY STENT PLACEMENT;  Surgeon: Ronnette Juniper, MD;  Location: [facility] ENDOSCOPY;  Service: Gastroenterology;;  . CHOLECYSTECTOMY N/A 11/10/2018   Procedure: Laparoscopic Cholecystectomy;  Surgeon: Stark Klein, MD;  Location: [place];  Service: General;  Laterality:  N/A;  . DIAGNOSTIC LAPAROSCOPIC LIVER BIOPSY N/A 11/10/2018   Procedure: Diagnostic Laparoscopic Liver Biopsy;  Surgeon: Stark Klein, MD;  Location: [place];  Service: General;  Laterality: N/A;  . ERCP N/A 06/10/2018   Procedure: ENDOSCOPIC RETROGRADE CHOLANGIOPANCREATOGRAPHY (ERCP);  Surgeon: Ronnette Juniper, MD;  Location: [place];  Service: Gastroenterology;  Laterality: N/A;  . ESOPHAGOGASTRODUODENOSCOPY (EGD) WITH PROPOFOL N/A 06/10/2018   Procedure: ESOPHAGOGASTRODUODENOSCOPY (EGD) WITH PROPOFOL;  Surgeon: Ronnette Juniper, MD;  Location: [place];  Service: Gastroenterology;  Laterality: N/A;  . FINE NEEDLE ASPIRATION  06/10/2018   Procedure: FINE NEEDLE ASPIRATION (FNA) LINEAR;  Surgeon: Ronnette Juniper, MD;  Location: [place];  Service: Gastroenterology;;  . PORT-A-CATH REMOVAL N/A 11/10/2018   Procedure: REMOVAL PORT-A-CATH;  Surgeon: Stark Klein, MD;  Location: [place];  Service: General;  Laterality: N/A;  . PORTACATH PLACEMENT N/A 07/02/2018   Procedure: INSERTION PORT-A-CATH;  Surgeon: Stark Klein, MD;  Location: [place];  Service: General;  Laterality: N/A;  . SPHINCTEROTOMY  06/10/2018   Procedure: SPHINCTEROTOMY;  Surgeon: Ronnette Juniper, MD;  Location: [place];  Service: Gastroenterology;;  . UPPER ESOPHAGEAL  ENDOSCOPIC ULTRASOUND (EUS) N/A 06/10/2018   Procedure: UPPER ESOPHAGEAL ENDOSCOPIC ULTRASOUND (EUS);  Surgeon: Ronnette Juniper, MD;  Location: [place];  Service: Gastroenterology;  Laterality: N/A;  . VASECTOMY      I have reviewed the social history and family history with the patient and they are unchanged from previous note.  ALLERGIES:  is allergic to contrast media [iodinated diagnostic agents] and iohexol.  MEDICATIONS:  Current Outpatient Medications  Medication Sig Dispense Refill  . diphenoxylate-atropine (LOMOTIL) 2.5-0.025 MG tablet Take 2 tablets by mouth 4 (four) times daily as needed for diarrhea or loose stools. 40 tablet 0  . docusate sodium (COLACE)  100 MG capsule Take 200 mg by mouth daily as needed for mild constipation.    Marland Kitchen esomeprazole (NEXIUM) 20 MG capsule Take 20 mg by mouth daily after breakfast.     . folic acid (FOLVITE) 1 MG tablet Take 1 tablet (1 mg total) by mouth daily after breakfast. 30 tablet 3  . magic mouthwash w/lidocaine SOLN Take 5 mLs by mouth 4 (four) times daily as needed for mouth pain. 240 mL 2  . rivaroxaban (XARELTO) 20 MG TABS tablet Take 1 tablet (20 mg total) by mouth daily with supper. (Patient taking differently: Take 20 mg by mouth daily after breakfast. ) 30 tablet 2  . simethicone (MYLICON) 382 MG chewable tablet Chew 250 mg by mouth every 6 (six) hours as needed for flatulence.     . Syringe, Disposable, (10-12CC SYRINGE) 12 ML MISC 10 mLs by Does not apply route as needed. 1 each 1   No current facility-administered medications for this visit.     PHYSICAL EXAMINATION: ECOG PERFORMANCE STATUS: 1 - Symptomatic but completely ambulatory  Vitals:   12/08/18 1141  BP: 132/88  Pulse: 68  Resp: 18  Temp: 98 F (36.7 C)  SpO2: 99%   Filed Weights   12/08/18 1141  Weight: 189 lb 11.2 oz (86 kg)    GENERAL:alert, no distress and comfortable SKIN: skin color, texture, turgor are normal, no rashes or significant lesions EYES: normal, Conjunctiva are pink and non-injected, sclera clear OROPHARYNX:no exudate, no erythema and lips, buccal mucosa, and tongue normal  NECK: supple, thyroid normal size, non-tender, without nodularity LYMPH:  no palpable lymphadenopathy in the cervical, axillary or inguinal LUNGS: clear to auscultation and percussion with normal breathing effort HEART: regular rate & rhythm and no murmurs and no lower extremity edema ABDOMEN:abdomen soft, non-tender and normal bowel sounds (+) left-mid abdominal JP drain in place.  Musculoskeletal:no cyanosis of digits and no clubbing  NEURO: alert & oriented x 3 with fluent speech, no focal motor/sensory deficits  LABORATORY DATA:   I have reviewed the data as listed CBC Latest Ref Rng & Units 12/08/2018 11/22/2018 11/21/2018  WBC 4.0 - 10.5 K/uL 5.4 8.9 7.9  Hemoglobin 13.0 - 17.0 g/dL 12.2(L) 11.5(L) 10.4(L)  Hematocrit 39.0 - 52.0 % 37.9(L) 36.3(L) 32.2(L)  Platelets 150 - 400 K/uL 250 226 183     CMP Latest Ref Rng & Units 12/08/2018 11/24/2018 11/23/2018  Glucose 70 - 99 mg/dL 159(H) 115(H) 130(H)  BUN 6 - 20 mg/dL _0 Creatinine 0.61 - 1.24 mg/dL 0.78 0.64 0.83  Sodium 135 - 145 mmol/L 138 138 137  Potassium 3.5 - 5.1 mmol/L 3.9 3.6 2.9(L)  Chloride 98 - 111 mmol/L 103 105 102  CO2 22 - 32 mmol/L _1 Calcium 8.9 - 10.3 mg/dL 9.0 7.9(L) 7.9(L)  Total Protein 6.5 - 8.1  g/dL 7.3 - 6.4(L)  Total Bilirubin 0.3 - 1.2 mg/dL 0.3 - 0.5  Alkaline Phos 38 - 126 U/L 133(H) - 145(H)  AST 15 - 41 U/L 38 - 50(H)  ALT 0 - 44 U/L 76(H) - 73(H)      RADIOGRAPHIC STUDIES: I have personally reviewed the radiological images as listed and agreed with the findings in the report. No results found.   ASSESSMENT & PLAN:  Ernest Wu is a 58 y.o. male with   1. Adenocarcinoma of the pancreas,cT3N1M0, borderline resectable, liver metastasis in 11/2018 -Diagnosed in 06/2018. Treated with neoadjuvant chemo FOLFIRINOX.  -He underwent Cholecystectomy and liver biopsy on 11/10/18 with Dr. Barry Dienes and Whipple surgery was aborted due to new liver mets found during surgery.  -Unfortunately after his surgery he developed an abscess at surgical site which caused sepsis secondary to GNR bacteremia. He was treated with antibiotics and JP drain of abscess.  -He has recovered well. Appetite, energy and draining improved. Labs reviewed, CBC and CMP WNL except hg 12.2, BG 159, ALT 76, Alk phos 133. Ca 19.9 still pending.  -Plan to proceed with second-line Gemcitabine and Abraxane 2-3 weeks on/1 week off in 1-2 week.  -He proceeded with genetic panel today to see if he is a candidate for target or immunotherapy. Given his insurance requires  visit with counselor, he will see Genetic counselor today to be covered.  -He will have a CT scan and IR follow up later this week. 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 -Heis onXarelto20 mg daily now, continue   4. Constipation -Secondary to chemo and premedications. -He takes Miralax and Colase as needed  -BMs currently regular and daily.   5. Sepsis secondary to Haf[place] bacteriemia, s/p JP drain placement  -treated with antibiotics and draining of abdominal abscess.  -His draining is much less, down to 10-12 cc/day which has reduced by half from last week.  -He will see IR on 5/6. Pt would like to get drained removed soon.    Plan -he is clinically doing better. Labs reviewed. -will hold chemo today due to his recent infection -CT and f/u with IR on 5/6, and we will decide if he can start chemo next week  -Lab, flush, f/u and chemo gemcitabine and Abraxane in 1 and 2 weeks  -I refilled folic acid today    No problem-specific Assessment & Plan notes found for this encounter.   No orders of the defined types were placed in this encounter.  All questions were answered. The patient knows to call the clinic with any problems, questions or concerns. No barriers to learning was detected. I spent 20 minutes counseling the patient face to face. The total time spent in the appointment was 25 minutes and more than 50% was on counseling and review of test results     [name], MD  12/08/2018   I, Joslyn Devon, am acting as scribe for [name], MD.   I have reviewed the above documentation for accuracy and completeness, and  I agree with the above.

## 2018-12-08 ENCOUNTER — Inpatient Hospital Stay (HOSPITAL_BASED_OUTPATIENT_CLINIC_OR_DEPARTMENT_OTHER): Payer: Managed Care, Other (non HMO) | Admitting: Genetic Counselor

## 2018-12-08 ENCOUNTER — Inpatient Hospital Stay: Payer: Managed Care, Other (non HMO) | Attending: Hematology | Admitting: Hematology

## 2018-12-08 ENCOUNTER — Other Ambulatory Visit: Payer: Self-pay

## 2018-12-08 ENCOUNTER — Inpatient Hospital Stay: Payer: Managed Care, Other (non HMO)

## 2018-12-08 ENCOUNTER — Encounter: Payer: Self-pay | Admitting: Genetic Counselor

## 2018-12-08 ENCOUNTER — Telehealth: Payer: Self-pay | Admitting: Hematology

## 2018-12-08 VITALS — BP 132/88 | HR 68 | Temp 98.0°F | Resp 18 | Ht 72.0 in | Wt 189.7 lb

## 2018-12-08 DIAGNOSIS — R140 Abdominal distension (gaseous): Secondary | ICD-10-CM | POA: Insufficient documentation

## 2018-12-08 DIAGNOSIS — C259 Malignant neoplasm of pancreas, unspecified: Secondary | ICD-10-CM

## 2018-12-08 DIAGNOSIS — Z800 Family history of malignant neoplasm of digestive organs: Secondary | ICD-10-CM | POA: Insufficient documentation

## 2018-12-08 DIAGNOSIS — I2510 Atherosclerotic heart disease of native coronary artery without angina pectoris: Secondary | ICD-10-CM | POA: Diagnosis not present

## 2018-12-08 DIAGNOSIS — I700 Atherosclerosis of aorta: Secondary | ICD-10-CM | POA: Insufficient documentation

## 2018-12-08 DIAGNOSIS — F419 Anxiety disorder, unspecified: Secondary | ICD-10-CM | POA: Diagnosis not present

## 2018-12-08 DIAGNOSIS — Z9049 Acquired absence of other specified parts of digestive tract: Secondary | ICD-10-CM

## 2018-12-08 DIAGNOSIS — Z8042 Family history of malignant neoplasm of prostate: Secondary | ICD-10-CM | POA: Insufficient documentation

## 2018-12-08 DIAGNOSIS — R309 Painful micturition, unspecified: Secondary | ICD-10-CM | POA: Insufficient documentation

## 2018-12-08 DIAGNOSIS — F17210 Nicotine dependence, cigarettes, uncomplicated: Secondary | ICD-10-CM | POA: Diagnosis not present

## 2018-12-08 DIAGNOSIS — C787 Secondary malignant neoplasm of liver and intrahepatic bile duct: Secondary | ICD-10-CM | POA: Diagnosis not present

## 2018-12-08 DIAGNOSIS — Z803 Family history of malignant neoplasm of breast: Secondary | ICD-10-CM | POA: Insufficient documentation

## 2018-12-08 DIAGNOSIS — K5900 Constipation, unspecified: Secondary | ICD-10-CM | POA: Diagnosis not present

## 2018-12-08 DIAGNOSIS — R1030 Lower abdominal pain, unspecified: Secondary | ICD-10-CM | POA: Insufficient documentation

## 2018-12-08 DIAGNOSIS — Z7901 Long term (current) use of anticoagulants: Secondary | ICD-10-CM | POA: Insufficient documentation

## 2018-12-08 DIAGNOSIS — Z79899 Other long term (current) drug therapy: Secondary | ICD-10-CM | POA: Insufficient documentation

## 2018-12-08 DIAGNOSIS — R918 Other nonspecific abnormal finding of lung field: Secondary | ICD-10-CM

## 2018-12-08 DIAGNOSIS — Z8371 Family history of colonic polyps: Secondary | ICD-10-CM

## 2018-12-08 DIAGNOSIS — C250 Malignant neoplasm of head of pancreas: Secondary | ICD-10-CM | POA: Insufficient documentation

## 2018-12-08 DIAGNOSIS — Z86718 Personal history of other venous thrombosis and embolism: Secondary | ICD-10-CM | POA: Diagnosis not present

## 2018-12-08 DIAGNOSIS — Z5111 Encounter for antineoplastic chemotherapy: Secondary | ICD-10-CM | POA: Insufficient documentation

## 2018-12-08 DIAGNOSIS — Z1379 Encounter for other screening for genetic and chromosomal anomalies: Secondary | ICD-10-CM

## 2018-12-08 DIAGNOSIS — Z9889 Other specified postprocedural states: Secondary | ICD-10-CM | POA: Diagnosis not present

## 2018-12-08 DIAGNOSIS — Z83719 Family history of colon polyps, unspecified: Secondary | ICD-10-CM

## 2018-12-08 LAB — CMP (CANCER CENTER ONLY)
ALT: 76 U/L — ABNORMAL HIGH (ref 0–44)
AST: 38 U/L (ref 15–41)
Albumin: 3.3 g/dL — ABNORMAL LOW (ref 3.5–5.0)
Alkaline Phosphatase: 133 U/L — ABNORMAL HIGH (ref 38–126)
Anion gap: 7 (ref 5–15)
BUN: 8 mg/dL (ref 6–20)
CO2: 28 mmol/L (ref 22–32)
Calcium: 9 mg/dL (ref 8.9–10.3)
Chloride: 103 mmol/L (ref 98–111)
Creatinine: 0.78 mg/dL (ref 0.61–1.24)
GFR, Est AFR Am: >60 mL/min (ref >60)
GFR, Estimated: >60 mL/min (ref >60)
Glucose, Bld: 159 mg/dL — ABNORMAL HIGH (ref 70–99)
Potassium: 3.9 mmol/L (ref 3.5–5.1)
Sodium: 138 mmol/L (ref 135–145)
Total Bilirubin: 0.3 mg/dL (ref 0.3–1.2)
Total Protein: 7.3 g/dL (ref 6.5–8.1)

## 2018-12-08 LAB — CBC WITH DIFFERENTIAL (CANCER CENTER ONLY)
Abs Immature Granulocytes: 0.01 10*3/uL (ref 0.00–0.07)
Basophils Absolute: 0.1 10*3/uL (ref 0.0–0.1)
Basophils Relative: 1 %
Eosinophils Absolute: 0.2 10*3/uL (ref 0.0–0.5)
Eosinophils Relative: 3 %
HCT: 37.9 % — ABNORMAL LOW (ref 39.0–52.0)
Hemoglobin: 12.2 g/dL — ABNORMAL LOW (ref 13.0–17.0)
Immature Granulocytes: 0 %
Lymphocytes Relative: 20 %
Lymphs Abs: 1.1 10*3/uL (ref 0.7–4.0)
MCH: 30 pg (ref 26.0–34.0)
MCHC: 32.2 g/dL (ref 30.0–36.0)
MCV: 93.1 fL (ref 80.0–100.0)
Monocytes Absolute: 0.3 10*3/uL (ref 0.1–1.0)
Monocytes Relative: 6 %
Neutro Abs: 3.8 10*3/uL (ref 1.7–7.7)
Neutrophils Relative %: 70 %
Platelet Count: 250 10*3/uL (ref 150–400)
RBC: 4.07 MIL/uL — ABNORMAL LOW (ref 4.22–5.81)
RDW: 14.2 % (ref 11.5–15.5)
WBC Count: 5.4 10*3/uL (ref 4.0–10.5)
nRBC: 0 % (ref 0.0–0.2)

## 2018-12-08 MED ORDER — FOLIC ACID 1 MG PO TABS: 1.0000 mg | ORAL_TABLET | Freq: Every day | ORAL | 3 refills | Status: AC

## 2018-12-08 NOTE — Progress Notes (Signed)
REFERRING PROVIDER: [name], MD [address] [place], [place] [address]  PRIMARY PROVIDER:  Street, [name], MD  PRIMARY REASON FOR VISIT:  1. Adenocarcinoma of pancreas ([place])   2. Family history of pancreatic cancer   3. Family history of breast cancer   4. Family history of colonic polyps   5. Family history of prostate cancer      HISTORY OF PRESENT ILLNESS:   Ernest Wu, a 58 y.o. male, was seen for a [place] cancer genetics consultation at the request of Dr. Burr Medico due to a personal and family history of cancer.  Ernest Wu presents to clinic today to discuss the possibility of a hereditary predisposition to cancer, genetic testing, and to further clarify his future cancer risks, as well as potential cancer risks for family members.   In November 2019, at the age of 80, Ernest Wu was diagnosed with cancer of the pancreas. The treatment plan is chemotherapy and surgery.      CANCER HISTORY:  Oncology History   Cancer Staging Adenocarcinoma of pancreas [facility]) Staging form: Exocrine Pancreas, AJCC 8th Edition - Clinical stage from 06/10/2018: Stage IB (cT2, cN0, cM0) - Signed by [name], MD on 06/29/2018       Adenocarcinoma of pancreas ([place])   06/07/2018 Imaging    CT AP w Contrast IMPRESSION: 1. There is a low attenuation mass centered around the neck of pancreas which is concerning for neoplasm. Pancreatic adenocarcinoma favored. This results in common bile duct obstruction with mild intrahepatic biliary ductal dilatation. There also is involvement of the portal venous confluence. Further evaluation with nonemergent contrast enhanced MRI of the pancreas is recommended. 2. Small indeterminate low-attenuation structure is noted within segment 4 a of the liver. This could be better addressed at MRI.    06/09/2018 Imaging    MR ABD MRCP  The pancreatic mass involves approximately 40 percent of the main portal vein circumference at the portal splenic venous  confluence, with associated mild narrowing of the portal splenic venous confluence. The SMV, splenic vein and main, right and left portal veins remain patent. The celiac trunk and SMA are not involved by the pancreatic mass.  IMPRESSION: 2.3 cm diameter hypoenhancing mass at the junction of the head and neck of pancreas with pancreatic ductal dilatation, likely adenocarcinoma.  Intra and extrahepatic bile duct dilatation with abrupt change in caliber at the mid common bile duct. This could indicate an obstructing mass or stricture.     06/10/2018 Initial Biopsy    Diagnosis PANCREAS, FINE NEEDLE ASPIRATION (SPECIMEN 1 OF 1 COLLECTED 06/10/18): MALIGNANT CELLS CONSISTENT WITH ADENOCARCINOMA.    06/10/2018 Procedure    IMPRESSION: 1. 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 2. Placement of a metallic biliary stent.    06/10/2018 Procedure    EUS per Dr. Paulita Fujita Impression:  - There was dilation in the common bile duct which measured up to 12 mm. - A mass was identified in the pancreatic head. This was staged T3 N1 Mx by endosonographic criteria. Fine needle aspiration performed. - A few lymph nodes were visualized and measured in the peripancreatic region. - There was no evidence of significant pathology in the left lobe of the liver.    06/10/2018 Cancer Staging    Staging form: Exocrine Pancreas, AJCC 8th Edition - Clinical stage from 06/10/2018: Stage IB (cT2, cN0, cM0) - Signed by [name], MD on 06/29/2018    06/27/2018 Initial  Diagnosis    Adenocarcinoma of pancreas ([place])    07/02/2018 Imaging    IMPRESSION: 1. Multiple small pulmonary nodules scattered throughout the lungs measuring up to 7 mm in size. These are nonspecific, but the possibility of metastatic disease should be considered, and close attention at time of routine followups is recommended. 2. In addition, today's study demonstrates new and  enlarging low-attenuation lesions in the liver. This is poorly evaluated on today's noncontrast CT examination, but is concerning for potential metastatic disease. Further evaluation with repeat nonemergent MRI of the abdomen with and without IV gadolinium is suggested in the near future to better evaluate these findings. 3. Aortic atherosclerosis, in addition to left main and 2 vessel coronary artery disease. Please note that although the presence of coronary artery calcium documents the presence of coronary artery disease, the severity of this disease and any potential stenosis cannot be assessed on this non-gated CT examination. Assessment for potential risk factor modification, dietary therapy or pharmacologic therapy may be warranted, if clinically indicated. 4. Mild aneurysmal dilatation of the ascending thoracic aorta (4.8 cm in diameter). Ascending thoracic aortic aneurysm. Recommend semi-annual imaging followup by CTA or MRA and referral to cardiothoracic surgery if not already obtained. This recommendation follows 2010 ACCF/AHA/AATS/ACR/ASA/SCA/SCAI/SIR/STS/SVM Guidelines for the Diagnosis and Management of Patients With Thoracic Aortic Disease. Circulation. 2010; 121: H607-P710.  Aortic Atherosclerosis (ICD10-I70.0). Aortic aneurysm NOS (ICD10-I71.9).    07/04/2018 - 10/16/2018 Chemotherapy     FOLFIRINOX q2 weeks    07/15/2018 Imaging    07/15/2018 Liver US IMPRESSION: No liver lesions identified with ultrasound. Ultrasound-guided biopsy was not performed. Recommend further characterization for liver lesions with a repeat MRI, with and without contrast.    07/24/2018 Imaging    07/24/2018 MRI Abdomen IMPRESSION: 1. The hypoenhancing mass at the junction of the pancreatic body and head has reduced in size, previously 3.2 by 2.8 cm and currently 2.9 by 2.2 cm. A small peripancreatic lymph node adjacent to the mass was previously 0.9 cm in short axis and is currently  0.7 cm in short axis. The amount of contact between the pancreatic mass and the confluence of the splenic vein and SMV is similar to the prior exam. 2. There is a 6 mm probable hemangioma in segment 4a of the liver, based on the delayed enhancement pattern. This is somewhat ill-defined. The lesion appeared larger on the prior noncontrast CT but presumably may have been overestimated on that exam. There is also some hypodensity along the dome of the right hepatic lobe which appears to most likely be due to a slip of the diaphragm rather than a discernible lesion on MRI. 3. Aortic Atherosclerosis (ICD10-I70.0). Notably, there is a small amount of mural thrombus along the right side of the abdominal aorta below the right renal artery level which has not been seen previously.    09/03/2018 Imaging    09/03/2018 MRI Abdomen IMPRESSION: 1. Stable mass (adenocarcinoma) in the neck of the pancreas with upstream duct dilatation. 2. No evidence of lymphadenopathy in the porta hepatis or peripancreatic fat. 3. No evidence hepatic metastasis. 4. Mild biliary duct dilatation LEFT hepatic lobe similar to comparison exam. Biliary stent within the common bile duct.    09/16/2018 Imaging    MR MRA CHEST W WO CONTRAST  IMPRESSION: VASCULAR  1. 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 2. Left subclavian port catheter to the SVC. 3. SVC is patent      10/24/2018 Imaging  MRI abdomen  IMPRESSION: 1. Slight interval decrease in size of the pancreatic mass. 2. Stable to slightly smaller adjacent lymph nodes. 3. No findings for metastatic disease involving the liver or lung bases. 4. Common bile duct stent in good position without complicating features.    11/10/2018 Surgery    PAC removal, Laparoscopic Cholecystectomy and Diagnostic Laparoscopic Liver Biopsy by Dre. Byerly  and Dr. Lucia Gaskins  11/10/18     11/10/2018 Pathology Results     Diagnosis 11/10/18 1. Liver, biopsy, Left - ADENOCARCINOMA. - SEE COMMENT. 2. Gallbladder - CHRONIC CHOLECYSTITIS WITH CHOLELITHIASIS. - ONE MORPHOLOGICALLY BENIGN LYMPH NODE.    12/15/2018 -  Chemotherapy    PENDING second-line chemo with Gemcitabine and Abraxane 2-3 weeks on/1 week off starting in 3 weeks        Past Medical History:  Diagnosis Date  . Anxiety   . Cancer ([place]) 06/2018   Pancreatic  . Family history of breast cancer   . Family history of colonic polyps   . Family history of pancreatic cancer   . Family history of prostate cancer   . GERD (gastroesophageal reflux disease)     Past Surgical History:  Procedure Laterality Date  . BILIARY STENT PLACEMENT  06/10/2018   Procedure: BILIARY STENT PLACEMENT;  Surgeon: Ronnette Juniper, MD;  Location: [facility] ENDOSCOPY;  Service: Gastroenterology;;  . CHOLECYSTECTOMY N/A 11/10/2018   Procedure: Laparoscopic Cholecystectomy;  Surgeon: Stark Klein, MD;  Location: [place];  Service: General;  Laterality: N/A;  . DIAGNOSTIC LAPAROSCOPIC LIVER BIOPSY N/A 11/10/2018   Procedure: Diagnostic Laparoscopic Liver Biopsy;  Surgeon: Stark Klein, MD;  Location: [place];  Service: General;  Laterality: N/A;  . ERCP N/A 06/10/2018   Procedure: ENDOSCOPIC RETROGRADE CHOLANGIOPANCREATOGRAPHY (ERCP);  Surgeon: Ronnette Juniper, MD;  Location: [place];  Service: Gastroenterology;  Laterality: N/A;  . ESOPHAGOGASTRODUODENOSCOPY (EGD) WITH PROPOFOL N/A 06/10/2018   Procedure: ESOPHAGOGASTRODUODENOSCOPY (EGD) WITH PROPOFOL;  Surgeon: Ronnette Juniper, MD;  Location: [place];  Service: Gastroenterology;  Laterality: N/A;  . FINE NEEDLE ASPIRATION  06/10/2018   Procedure: FINE NEEDLE ASPIRATION (FNA) LINEAR;  Surgeon: Ronnette Juniper, MD;  Location: [place];  Service: Gastroenterology;;  . PORT-A-CATH REMOVAL N/A 11/10/2018   Procedure: REMOVAL PORT-A-CATH;  Surgeon: Stark Klein, MD;  Location: [place];  Service: General;  Laterality: N/A;  . PORTACATH PLACEMENT  N/A 07/02/2018   Procedure: INSERTION PORT-A-CATH;  Surgeon: Stark Klein, MD;  Location: [place];  Service: General;  Laterality: N/A;  . SPHINCTEROTOMY  06/10/2018   Procedure: SPHINCTEROTOMY;  Surgeon: Ronnette Juniper, MD;  Location: [facility] ENDOSCOPY;  Service: Gastroenterology;;  . UPPER ESOPHAGEAL ENDOSCOPIC ULTRASOUND (EUS) N/A 06/10/2018   Procedure: UPPER ESOPHAGEAL ENDOSCOPIC ULTRASOUND (EUS);  Surgeon: Ronnette Juniper, MD;  Location: [place];  Service: Gastroenterology;  Laterality: N/A;  . VASECTOMY      Social History   Socioeconomic History  . Marital status: Married    Spouse name: Not on file  . Number of children: 5  . Years of education: Not on file  . Highest education level: Not on file  Occupational History  . Not on file  Social Needs  . Financial resource strain: Not on file  . Food insecurity:    Worry: Not on file    Inability: Not on file  . Transportation needs:    Medical: Not on file    Non-medical: Not on file  Tobacco Use  . Smoking status: Current Every Day Smoker    Packs/day: 0.25    Years:  30.00    Pack years: 7.50  . Smokeless tobacco: Never Used  . Tobacco comment: cut back considerably  Substance and Sexual Activity  . Alcohol use: Yes    Comment: 6-7 beers Q week  . Drug use: Never  . Sexual activity: Not on file  Lifestyle  . Physical activity:    Days per week: Not on file    Minutes per session: Not on file  . Stress: Not on file  Relationships  . Social connections:    Talks on phone: Not on file    Gets together: Not on file    Attends religious service: Not on file    Active member of club or organization: Not on file    Attends meetings of clubs or organizations: Not on file    Relationship status: Not on file  Other Topics Concern  . Not on file  Social History Narrative  . Not on file     FAMILY HISTORY:  We obtained a detailed, 4-generation family history.  Significant diagnoses are listed below: Family History   Problem Relation Age of Onset  . Stroke Mother   . Hypertension Mother   . CAD Father        CABG  . Pancreatic cancer Paternal Grandfather   . Breast cancer Maternal Aunt   . Lymphoma Paternal Uncle   . Other Maternal Uncle        [name]  . Stroke Maternal Grandmother   . Prostate cancer Cousin        pat first cousin, dx between 71-60    The patient has three children, two daughters and a son, who are cancer free. He has a brother and sister who are cancer free.  Both parents are living.  The patient's father had colon polyps but has not had cancer.  He had two brothers, one had lymphoma and died and the other had a son with prostate cancer.  The paternal grandparents are deceased.  The grandfather had pancreatic cancer and the grandmother died of natural causes.  The patient's mother is living and has not had cancer.  She had three sisters and a brother.  The brother died in [place] war, and one sister had breast cancer in her late 40's.  This sister was married to the patient's paternal uncle who had lymphoma.  The maternal grandparents are deceased and ddi not have cancer.  Ernest Wu is unaware of previous family history of genetic testing for hereditary cancer risks. Patient's maternal ancestors are of Caucasian descent, and paternal ancestors are of Caucasian descent. There is no reported Ashkenazi Jewish ancestry. There is no known consanguinity.  GENETIC COUNSELING ASSESSMENT: Ernest Wu is a 58 y.o. male with a personal and family history of cancer which is somewhat suggestive of a hereditary cancer syndrome and predisposition to cancer. We, therefore, discussed and recommended the following at today's visit.   DISCUSSION: We discussed that 5 - 10% of pancreatic cancer is hereditary, with most cases associated with BRCA2 mutations.  There are other genes that can be associated with hereditary pancreatic cancer syndromes.  These include PALB2, ATM and the Lynch syndrome genes.   We discussed that testing is beneficial for several reasons including knowing how to follow individuals after completing their treatment, identifying whether potential treatment options such as PARP inhibitors would be beneficial, and understand if other family members could be at risk for cancer and allow them to undergo genetic testing.   We reviewed the  characteristics, features and inheritance patterns of hereditary cancer syndromes. We also discussed genetic testing, including the appropriate family members to test, the process of testing, insurance coverage and turn-around-time for results. We discussed the implications of a negative, positive and/or variant of uncertain significant result. We recommended Ernest Wu pursue genetic testing for the Custom-Next cancer+RNA insight gene panel. The CustomNext-Expanded gene panel offered by [facility] and includes sequencing and rearrangement analysis for the following 81 genes: AIP, ALK, APC*, ATM*, AXIN2, BAP1, BARD1, BLM, BMPR1A, BRCA1*, BRCA2*, BRIP1*, CDC73, CDH1*, CDK4, CDKN1B, CDKN2A, CHEK2*, CTNNA1, DICER1, FANCC, FH, FLCN, GALNT12, HOXB13, KIT, MAX, MEN1, MET, MLH1*, MRE11A, MSH2*, MSH6*, MUTYH*, NBN, NF1*, NF2, NTHL1, PALB2*, PDGFRA, PHOX2B, PMS2*, POLD1, POLE, POT1, PRKAR1A, PTCH1, PTEN*, RAD50, RAD51C*, RAD51D*, RB1, RET, SDHA, SDHAF2, SDHB, SDHC, SDHD, SMAD4, SMARCA4, SMARCB1, SMARCE1, STK11, SUFU, TMEM127, TP53*, TSC1, TSC2, VHL and XRCC2 (sequencing and deletion/duplication); CASR, CFTR, CPA1, CTRC, EGFR, MITF, PRSS1 and SPINK1 (sequencing only); EPCAM and GREM1 (deletion/duplication only). DNA and RNA analyses performed for * genes.   Based on Ernest Wu personal and family history of cancer, he meets medical criteria for genetic testing. Despite that he meets criteria, he may still have an out of pocket cost. We discussed that if his out of pocket cost for testing is over $100, the laboratory will call and confirm whether he wants to proceed  with testing.  If the out of pocket cost of testing is less than $100 he will be billed by the genetic testing laboratory.   PLAN: After considering the risks, benefits, and limitations, Ernest Wu provided informed consent to pursue genetic testing and the blood sample was sent to [facility] for analysis of the CustomNext-Cancer+RNA insight. Results should be available within approximately 2-3 weeks' time, at which point they will be disclosed by telephone to Ernest Wu, as will any additional recommendations warranted by these results. Ernest Wu will receive a summary of his genetic counseling visit and a copy of his results once available. This information will also be available in Epic.   Lastly, we encouraged Ernest Wu to remain in contact with cancer genetics annually so that we can continuously update the family history and inform him of any changes in cancer genetics and testing that may be of benefit for this family.   Ernest Wu questions were answered to his satisfaction today. Our contact information was provided should additional questions or concerns arise. Thank you for the referral and allowing [name] to share in the care of your patient.   Travonta Gill P. Florene Glen, [place], [facility] Certified Genetic Counselor [email or web address] phone: [phone]  The patient was seen for a total of 35 minutes in face-to-face genetic counseling.  This patient was discussed with Drs. Magrinat, Lindi Adie and/or Burr Medico who agrees with the above.    _______________________________________________________________________ For Office Staff:  Number of people involved in session: 1 Was an Intern/ student involved with case: no

## 2018-12-08 NOTE — Telephone Encounter (Signed)
Scheduled appt per 5/4 los. °

## 2018-12-09 ENCOUNTER — Encounter: Payer: Self-pay | Admitting: Hematology

## 2018-12-09 LAB — CANCER ANTIGEN 19-9: CA 19-9: 18 U/mL (ref 0–35)

## 2018-12-10 ENCOUNTER — Encounter: Payer: Self-pay | Admitting: Hematology

## 2018-12-10 ENCOUNTER — Encounter: Payer: Self-pay | Admitting: Interventional Radiology

## 2018-12-10 ENCOUNTER — Ambulatory Visit
Admission: RE | Admit: 2018-12-10 | Discharge: 2018-12-10 | Disposition: A | Discharge status: Home / Self Care | Payer: Managed Care, Other (non HMO) | Source: Ambulatory Visit | Attending: General Surgery | Admitting: General Surgery

## 2018-12-10 ENCOUNTER — Other Ambulatory Visit: Payer: Self-pay

## 2018-12-10 ENCOUNTER — Ambulatory Visit
Admission: RE | Admit: 2018-12-10 | Discharge: 2018-12-10 | Disposition: A | Discharge status: Home / Self Care | Payer: Managed Care, Other (non HMO) | Source: Ambulatory Visit | Attending: Physician Assistant | Admitting: Physician Assistant

## 2018-12-10 ENCOUNTER — Telehealth: Payer: Self-pay

## 2018-12-10 DIAGNOSIS — Z7189 Other specified counseling: Secondary | ICD-10-CM | POA: Insufficient documentation

## 2018-12-10 DIAGNOSIS — L0291 Cutaneous abscess, unspecified: Secondary | ICD-10-CM

## 2018-12-10 HISTORY — PX: IR RADIOLOGIST EVAL & MGMT: IMG5224

## 2018-12-10 NOTE — Progress Notes (Signed)
Referring Physician(s): Dr. Barry Dienes  Chief Complaint: The patient is seen in follow up today s/p gall bladder fossa abscess  History of present illness: Ernest Wu is a 58 y.o. 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 Barry Dienes. Patient was planning for Whipple procedure however this was canceled due to liver metastasis.  Patient then presented to Urgent care 4/13 with abdominal pain and fever. He was found to have a gall bladder fossa abscess. He underwent drain placement 4/17.  He was discharged home with drain in place.  He returns to IR drain clinic today for evaluation and management of his drain.   Patient presents to clinic today in stable condition.  He has completed antibiotic therapy.  No new fevers, chills.  He has chronic abdominal pain which is unchanged. He is flushing his drain once daily.  Reports a small amount of intermittent output. No additional concerns today.    Past Medical History:  Diagnosis Date  . Anxiety   . Cancer ([place]) 06/2018   Pancreatic  . Family history of breast cancer   . Family history of colonic polyps   . Family history of pancreatic cancer   . Family history of prostate cancer   . GERD (gastroesophageal reflux disease)     Past Surgical History:  Procedure Laterality Date  . BILIARY STENT PLACEMENT  06/10/2018   Procedure: BILIARY STENT PLACEMENT;  Surgeon: Ronnette Juniper, MD;  Location: [facility] ENDOSCOPY;  Service: Gastroenterology;;  . CHOLECYSTECTOMY N/A 11/10/2018   Procedure: Laparoscopic Cholecystectomy;  Surgeon: Stark Klein, MD;  Location: [place];  Service: General;  Laterality: N/A;  . DIAGNOSTIC LAPAROSCOPIC LIVER BIOPSY N/A 11/10/2018   Procedure: Diagnostic Laparoscopic Liver Biopsy;  Surgeon: Stark Klein, MD;  Location: [place];  Service: General;  Laterality: N/A;  .  ERCP N/A 06/10/2018   Procedure: ENDOSCOPIC RETROGRADE CHOLANGIOPANCREATOGRAPHY (ERCP);  Surgeon: Ronnette Juniper, MD;  Location: [place];  Service: Gastroenterology;  Laterality: N/A;  . ESOPHAGOGASTRODUODENOSCOPY (EGD) WITH PROPOFOL N/A 06/10/2018   Procedure: ESOPHAGOGASTRODUODENOSCOPY (EGD) WITH PROPOFOL;  Surgeon: Ronnette Juniper, MD;  Location: [place];  Service: Gastroenterology;  Laterality: N/A;  . FINE NEEDLE ASPIRATION  06/10/2018   Procedure: FINE NEEDLE ASPIRATION (FNA) LINEAR;  Surgeon: Ronnette Juniper, MD;  Location: [place];  Service: Gastroenterology;;  . PORT-A-CATH REMOVAL N/A 11/10/2018   Procedure: REMOVAL PORT-A-CATH;  Surgeon: Stark Klein, MD;  Location: [place];  Service: General;  Laterality: N/A;  . PORTACATH PLACEMENT N/A 07/02/2018   Procedure: INSERTION PORT-A-CATH;  Surgeon: Stark Klein, MD;  Location: [place];  Service: General;  Laterality: N/A;  . SPHINCTEROTOMY  06/10/2018   Procedure: SPHINCTEROTOMY;  Surgeon: Ronnette Juniper, MD;  Location: [facility] ENDOSCOPY;  Service: Gastroenterology;;  . UPPER ESOPHAGEAL ENDOSCOPIC ULTRASOUND (EUS) N/A 06/10/2018   Procedure: UPPER ESOPHAGEAL ENDOSCOPIC ULTRASOUND (EUS);  Surgeon: Ronnette Juniper, MD;  Location: [place];  Service: Gastroenterology;  Laterality: N/A;  . VASECTOMY      Allergies: Contrast media [iodinated diagnostic agents] and Iohexol  Medications: Prior to Admission medications   Medication Sig Start Date End Date Taking? Authorizing Provider  diphenoxylate-atropine (LOMOTIL) 2.5-0.025 MG tablet Take 2 tablets by mouth 4 (four) times daily as needed for diarrhea or loose stools. 06/27/18   Alla Feeling, NP  docusate sodium (COLACE) 100 MG capsule Take 200 mg by mouth daily as needed for mild constipation.  [provider]  esomeprazole (NEXIUM) 20 MG capsule Take 20 mg by mouth daily after breakfast.     [provider]  folic acid (FOLVITE) 1 MG tablet Take 1 tablet (1 mg total) by mouth  daily after breakfast. 12/08/18   [name], MD  magic mouthwash w/lidocaine SOLN Take 5 mLs by mouth 4 (four) times daily as needed for mouth pain. 08/22/18   Tanner, Lyndon Code., PA-C  rivaroxaban (XARELTO) 20 MG TABS tablet Take 1 tablet (20 mg total) by mouth daily with supper. Patient taking differently: Take 20 mg by mouth daily after breakfast.  10/02/18   [name], MD  simethicone (MYLICON) 419 MG chewable tablet Chew 250 mg by mouth every 6 (six) hours as needed for flatulence.     [provider]  Syringe, Disposable, (10-12CC SYRINGE) 12 ML MISC 10 mLs by Does not apply route as needed. 12/04/18   Alla Feeling, NP  prochlorperazine (COMPAZINE) 10 MG tablet Take 1 tablet (10 mg total) by mouth every 6 (six) hours as needed (NAUSEA). 06/29/18 11/12/18  [name], MD     Family History  Problem Relation Age of Onset  . Stroke Mother   . Hypertension Mother   . CAD Father        CABG  . Pancreatic cancer Paternal Grandfather   . Breast cancer Maternal Aunt   . Lymphoma Paternal Uncle   . Other Maternal Uncle        [name]  . Stroke Maternal Grandmother   . Prostate cancer Cousin        pat first cousin, dx between 46-60    Social History   Socioeconomic History  . Marital status: Married    Spouse name: Not on file  . Number of children: 5  . Years of education: Not on file  . Highest education level: Not on file  Occupational History  . Not on file  Social Needs  . Financial resource strain: Not on file  . Food insecurity:    Worry: Not on file    Inability: Not on file  . Transportation needs:    Medical: Not on file    Non-medical: Not on file  Tobacco Use  . Smoking status: Current Every Day Smoker    Packs/day: 0.25    Years: 30.00    Pack years: 7.50  . Smokeless tobacco: Never Used  . Tobacco comment: cut back considerably  Substance and Sexual Activity  . Alcohol use: Yes    Comment: 6-7 beers Q week  . Drug use: Never  . Sexual activity:  Not on file  Lifestyle  . Physical activity:    Days per week: Not on file    Minutes per session: Not on file  . Stress: Not on file  Relationships  . Social connections:    Talks on phone: Not on file    Gets together: Not on file    Attends religious service: Not on file    Active member of club or organization: Not on file    Attends meetings of clubs or organizations: Not on file    Relationship status: Not on file  Other Topics Concern  . Not on file  Social History Narrative  . Not on file     Vital Signs: BP (!) 141/83   Pulse 76   Temp (!) 97.5 F (36.4 C)   SpO2 97%   Physical Exam  NAD, alert Abdomen: soft, non-distended.  Drain in place.  Insertion site c/d/i.  No output in bulb.   Imaging: No results found.  Labs:  CBC: Recent Labs    11/20/18 0910 11/21/18 0453 11/22/18 0841 12/08/18 1101  WBC 8.5 7.9 8.9 5.4  HGB 10.5* 10.4* 11.5* 12.2*  HCT 32.6* 32.2* 36.3* 37.9*  PLT 158 183 226 250    COAGS: Recent Labs    06/07/18 1915 07/15/18 1140 11/10/18 0810 11/21/18 0453 11/21/18 1513  INR 0.98 0.93 0.9 1.1  --   APTT  --   --   --   --  33    BMP: Recent Labs    11/21/18 0453 11/23/18 0412 11/24/18 0434 12/08/18 1101  NA 135 137 138 138  K 3.7 2.9* 3.6 3.9  CL 104 102 105 103  CO2 22 26 26 28   GLUCOSE 116* 130* 115* 159*  BUN 7 10 7 8   CALCIUM 8.2* 7.9* 7.9* 9.0  CREATININE 0.73 0.83 0.64 0.78  GFRNONAA >60 >60 >60 >60  GFRAA >60 >60 >60 >60    LIVER FUNCTION TESTS: Recent Labs    11/19/18 1723 11/20/18 0910 11/23/18 0412 12/08/18 1101  BILITOT 0.2* 0.7 0.5 0.3  AST 46* 53* 50* 38  ALT 84* 85* 73* 76*  ALKPHOS 157* 157* 145* 133*  PROT 6.3* 6.5 6.4* 7.3  ALBUMIN 2.6* 2.8* 2.4* 3.3*    Assessment: Gallbladder fossa abscess s/p drain placement 11/21/18 Patient presents to clinic today after gall bladder fossa abscess drain placement.  He has been doing well at home.  His drainage has decreased significantly.   Currently he has no output in bulb.  He has been flushing once daily at home without issue.  He has completed antibiotics. Denies fever, chills, abdominal pain uncharacteristic of his usual.  CT Abdomen/Pelvis and drain injection performed today and reviewed by Dr. Laurence Ferrari.  Drain removed by PA at bedside in its entirety and without complication.  Dressing applied.  No further follow-up needed at this time.   Signed: Docia Barrier, PA 12/10/2018, 10:09 AM   Please refer to Dr. Laurence Ferrari attestation of this note for management and plan.

## 2018-12-10 NOTE — Telephone Encounter (Signed)
Oncology Treatment Plan and OV note from 5/4 were faxed to [place] at [phone], sent to HIM for scanning to chart.

## 2018-12-11 ENCOUNTER — Telehealth: Payer: Self-pay

## 2018-12-11 NOTE — Telephone Encounter (Signed)
Patient's wife Ernest Wu calls he is requesting to postpone the chemo scheduled for Monday 5/11, consulted with Dr. Burr Medico, this is fine to wait until 5/18, called her back and notified her and gave her the times for Monday 5/18.  She verbalized an understanding.

## 2018-12-15 ENCOUNTER — Inpatient Hospital Stay: Payer: Managed Care, Other (non HMO)

## 2018-12-15 ENCOUNTER — Inpatient Hospital Stay: Payer: Managed Care, Other (non HMO) | Admitting: Nurse Practitioner

## 2018-12-22 ENCOUNTER — Telehealth: Payer: Self-pay | Admitting: Nurse Practitioner

## 2018-12-22 ENCOUNTER — Ambulatory Visit: Payer: Self-pay | Admitting: Genetic Counselor

## 2018-12-22 ENCOUNTER — Inpatient Hospital Stay: Payer: Managed Care, Other (non HMO)

## 2018-12-22 ENCOUNTER — Other Ambulatory Visit: Payer: Self-pay

## 2018-12-22 ENCOUNTER — Encounter: Payer: Self-pay | Admitting: Nurse Practitioner

## 2018-12-22 ENCOUNTER — Inpatient Hospital Stay (HOSPITAL_BASED_OUTPATIENT_CLINIC_OR_DEPARTMENT_OTHER): Payer: Managed Care, Other (non HMO) | Admitting: Nurse Practitioner

## 2018-12-22 ENCOUNTER — Encounter: Payer: Self-pay | Admitting: Genetic Counselor

## 2018-12-22 VITALS — BP 136/83 | HR 81 | Temp 98.5°F | Resp 18 | Ht 72.0 in | Wt 188.2 lb

## 2018-12-22 DIAGNOSIS — K5900 Constipation, unspecified: Secondary | ICD-10-CM

## 2018-12-22 DIAGNOSIS — Z800 Family history of malignant neoplasm of digestive organs: Secondary | ICD-10-CM

## 2018-12-22 DIAGNOSIS — R918 Other nonspecific abnormal finding of lung field: Secondary | ICD-10-CM

## 2018-12-22 DIAGNOSIS — Z9889 Other specified postprocedural states: Secondary | ICD-10-CM | POA: Diagnosis not present

## 2018-12-22 DIAGNOSIS — R1030 Lower abdominal pain, unspecified: Secondary | ICD-10-CM

## 2018-12-22 DIAGNOSIS — R309 Painful micturition, unspecified: Secondary | ICD-10-CM

## 2018-12-22 DIAGNOSIS — C787 Secondary malignant neoplasm of liver and intrahepatic bile duct: Secondary | ICD-10-CM

## 2018-12-22 DIAGNOSIS — C259 Malignant neoplasm of pancreas, unspecified: Secondary | ICD-10-CM

## 2018-12-22 DIAGNOSIS — F17210 Nicotine dependence, cigarettes, uncomplicated: Secondary | ICD-10-CM

## 2018-12-22 DIAGNOSIS — I700 Atherosclerosis of aorta: Secondary | ICD-10-CM

## 2018-12-22 DIAGNOSIS — Z803 Family history of malignant neoplasm of breast: Secondary | ICD-10-CM

## 2018-12-22 DIAGNOSIS — I2510 Atherosclerotic heart disease of native coronary artery without angina pectoris: Secondary | ICD-10-CM

## 2018-12-22 DIAGNOSIS — Z86718 Personal history of other venous thrombosis and embolism: Secondary | ICD-10-CM

## 2018-12-22 DIAGNOSIS — R300 Dysuria: Secondary | ICD-10-CM

## 2018-12-22 DIAGNOSIS — Z79899 Other long term (current) drug therapy: Secondary | ICD-10-CM

## 2018-12-22 DIAGNOSIS — Z9049 Acquired absence of other specified parts of digestive tract: Secondary | ICD-10-CM

## 2018-12-22 DIAGNOSIS — Z1379 Encounter for other screening for genetic and chromosomal anomalies: Secondary | ICD-10-CM | POA: Insufficient documentation

## 2018-12-22 DIAGNOSIS — C250 Malignant neoplasm of head of pancreas: Secondary | ICD-10-CM | POA: Diagnosis not present

## 2018-12-22 DIAGNOSIS — Z7901 Long term (current) use of anticoagulants: Secondary | ICD-10-CM

## 2018-12-22 DIAGNOSIS — F419 Anxiety disorder, unspecified: Secondary | ICD-10-CM

## 2018-12-22 DIAGNOSIS — R140 Abdominal distension (gaseous): Secondary | ICD-10-CM

## 2018-12-22 LAB — CBC WITH DIFFERENTIAL (CANCER CENTER ONLY)
Abs Immature Granulocytes: 0 10*3/uL (ref 0.00–0.07)
Basophils Absolute: 0 10*3/uL (ref 0.0–0.1)
Basophils Relative: 1 %
Eosinophils Absolute: 0.1 10*3/uL (ref 0.0–0.5)
Eosinophils Relative: 2 %
HCT: 38.4 % — ABNORMAL LOW (ref 39.0–52.0)
Hemoglobin: 12.5 g/dL — ABNORMAL LOW (ref 13.0–17.0)
Immature Granulocytes: 0 %
Lymphocytes Relative: 18 %
Lymphs Abs: 1 10*3/uL (ref 0.7–4.0)
MCH: 29.8 pg (ref 26.0–34.0)
MCHC: 32.6 g/dL (ref 30.0–36.0)
MCV: 91.6 fL (ref 80.0–100.0)
Monocytes Absolute: 0.4 10*3/uL (ref 0.1–1.0)
Monocytes Relative: 7 %
Neutro Abs: 4.2 10*3/uL (ref 1.7–7.7)
Neutrophils Relative %: 72 %
Platelet Count: 164 10*3/uL (ref 150–400)
RBC: 4.19 MIL/uL — ABNORMAL LOW (ref 4.22–5.81)
RDW: 13.9 % (ref 11.5–15.5)
WBC Count: 5.7 10*3/uL (ref 4.0–10.5)
nRBC: 0 % (ref 0.0–0.2)

## 2018-12-22 LAB — URINALYSIS, COMPLETE (UACMP) WITH MICROSCOPIC
Bacteria, UA: NONE SEEN
Bilirubin Urine: NEGATIVE
Glucose, UA: NEGATIVE mg/dL
Hgb urine dipstick: NEGATIVE
Ketones, ur: NEGATIVE mg/dL
Leukocytes,Ua: NEGATIVE
Nitrite: NEGATIVE
Protein, ur: NEGATIVE mg/dL
Specific Gravity, Urine: 1.006 (ref 1.005–1.030)
pH: 5 (ref 5.0–8.0)

## 2018-12-22 LAB — CMP (CANCER CENTER ONLY)
ALT: 28 U/L (ref 0–44)
AST: 20 U/L (ref 15–41)
Albumin: 3.6 g/dL (ref 3.5–5.0)
Alkaline Phosphatase: 107 U/L (ref 38–126)
Anion gap: 9 (ref 5–15)
BUN: 7 mg/dL (ref 6–20)
CO2: 26 mmol/L (ref 22–32)
Calcium: 8.9 mg/dL (ref 8.9–10.3)
Chloride: 104 mmol/L (ref 98–111)
Creatinine: 0.79 mg/dL (ref 0.61–1.24)
GFR, Est AFR Am: >60 mL/min (ref >60)
GFR, Estimated: >60 mL/min (ref >60)
Glucose, Bld: 147 mg/dL — ABNORMAL HIGH (ref 70–99)
Potassium: 3.9 mmol/L (ref 3.5–5.1)
Sodium: 139 mmol/L (ref 135–145)
Total Bilirubin: 0.3 mg/dL (ref 0.3–1.2)
Total Protein: 7 g/dL (ref 6.5–8.1)

## 2018-12-22 MED ORDER — PROCHLORPERAZINE MALEATE 10 MG PO TABS
ORAL_TABLET | ORAL | Status: AC
  Filled 2018-12-22: qty 1

## 2018-12-22 MED ORDER — SODIUM CHLORIDE 0.9 % IV SOLN
1000.0000 mg/m2 | Freq: Once | INTRAVENOUS | Status: AC
  Administered 2018-12-22: 17:00:00 2166 mg via INTRAVENOUS
  Filled 2018-12-22: qty 56.97

## 2018-12-22 MED ORDER — ONDANSETRON HCL 8 MG PO TABS: 8.0000 mg | ORAL_TABLET | Freq: Three times a day (TID) | ORAL | 1 refills | Status: AC | PRN

## 2018-12-22 MED ORDER — PROCHLORPERAZINE MALEATE 10 MG PO TABS: 10.0000 mg | ORAL_TABLET | Freq: Four times a day (QID) | ORAL | 1 refills | Status: DC | PRN

## 2018-12-22 MED ORDER — SODIUM CHLORIDE 0.9 % IV SOLN
Freq: Once | INTRAVENOUS | Status: AC
  Administered 2018-12-22: 16:00:00 via INTRAVENOUS
  Filled 2018-12-22: qty 250

## 2018-12-22 MED ORDER — PROCHLORPERAZINE MALEATE 10 MG PO TABS
10.0000 mg | ORAL_TABLET | Freq: Once | ORAL | Status: AC
  Administered 2018-12-22: 16:00:00 10 mg via ORAL

## 2018-12-22 MED ORDER — ONDANSETRON HCL 8 MG PO TABS: 8.0000 mg | ORAL_TABLET | Freq: Three times a day (TID) | ORAL | 0 refills | Status: DC | PRN

## 2018-12-22 MED ORDER — PROCHLORPERAZINE MALEATE 10 MG PO TABS: 10.0000 mg | ORAL_TABLET | Freq: Four times a day (QID) | ORAL | 0 refills | Status: DC | PRN

## 2018-12-22 MED ORDER — PACLITAXEL PROTEIN-BOUND CHEMO INJECTION 100 MG
125.0000 mg/m2 | Freq: Once | INTRAVENOUS | Status: DC
  Filled 2018-12-22: qty 55

## 2018-12-22 NOTE — Progress Notes (Unsigned)
c 

## 2018-12-22 NOTE — Progress Notes (Signed)
[place]   [phone] [phone]   Clinic Follow up Note   Patient Care Team: Street, [name], MD as PCP - General (Family Medicine) 12/22/2018  CHIEF COMPLAINT: f/u pancreatic cancer   SUMMARY OF ONCOLOGIC HISTORY: Oncology History   Cancer Staging Adenocarcinoma of pancreas [facility]) Staging form: Exocrine Pancreas, AJCC 8th Edition - Clinical stage from 06/10/2018: Stage IB (cT2, cN0, cM0) - Signed by [name], MD on 06/29/2018       Adenocarcinoma of pancreas ([place])   06/07/2018 Imaging    CT AP w Contrast IMPRESSION: 1. There is a low attenuation mass centered around the neck of pancreas which is concerning for neoplasm. Pancreatic adenocarcinoma favored. This results in common bile duct obstruction with mild intrahepatic biliary ductal dilatation. There also is involvement of the portal venous confluence. Further evaluation with nonemergent contrast enhanced MRI of the pancreas is recommended. 2. Small indeterminate low-attenuation structure is noted within segment 4 a of the liver. This could be better addressed at MRI.    06/09/2018 Imaging    MR ABD MRCP  The pancreatic mass involves approximately 40 percent of the main portal vein circumference at the portal splenic venous confluence, with associated mild narrowing of the portal splenic venous confluence. The SMV, splenic vein and main, right and left portal veins remain patent. The celiac trunk and SMA are not involved by the pancreatic mass.  IMPRESSION: 2.3 cm diameter hypoenhancing mass at the junction of the head and neck of pancreas with pancreatic ductal dilatation, likely adenocarcinoma.  Intra and extrahepatic bile duct dilatation with abrupt change in caliber at the mid common bile duct. This could indicate an obstructing mass or stricture.     06/10/2018 Initial Biopsy    Diagnosis PANCREAS, FINE NEEDLE ASPIRATION (SPECIMEN 1 OF 1 COLLECTED 06/10/18): MALIGNANT CELLS  CONSISTENT WITH ADENOCARCINOMA.    06/10/2018 Procedure    IMPRESSION: 1. 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 2. Placement of a metallic biliary stent.    06/10/2018 Procedure    EUS per Dr. Paulita Fujita Impression:  - There was dilation in the common bile duct which measured up to 12 mm. - A mass was identified in the pancreatic head. This was staged T3 N1 Mx by endosonographic criteria. Fine needle aspiration performed. - A few lymph nodes were visualized and measured in the peripancreatic region. - There was no evidence of significant pathology in the left lobe of the liver.    06/10/2018 Cancer Staging    Staging form: Exocrine Pancreas, AJCC 8th Edition - Clinical stage from 06/10/2018: Stage IB (cT2, cN0, cM0) - Signed by [name], MD on 06/29/2018    06/27/2018 Initial Diagnosis    Adenocarcinoma of pancreas ([place])    07/02/2018 Imaging    IMPRESSION: 1. Multiple small pulmonary nodules scattered throughout the lungs measuring up to 7 mm in size. These are nonspecific, but the possibility of metastatic disease should be considered, and close attention at time of routine followups is recommended. 2. In addition, today's study demonstrates new and enlarging low-attenuation lesions in the liver. This is poorly evaluated on today's noncontrast CT examination, but is concerning for potential metastatic disease. Further evaluation with repeat nonemergent MRI of the abdomen with and without IV gadolinium is suggested in the near future to better evaluate these findings. 3. Aortic atherosclerosis, in addition to left main and 2 vessel coronary artery disease. Please note that although the presence  of coronary artery calcium documents the presence of coronary artery disease, the severity of this disease and any potential stenosis cannot be assessed on this non-gated CT examination. Assessment for potential risk factor modification,  dietary therapy or pharmacologic therapy may be warranted, if clinically indicated. 4. Mild aneurysmal dilatation of the ascending thoracic aorta (4.8 cm in diameter). Ascending thoracic aortic aneurysm. Recommend semi-annual imaging followup by CTA or MRA and referral to cardiothoracic surgery if not already obtained. This recommendation follows 2010 ACCF/AHA/AATS/ACR/ASA/SCA/SCAI/SIR/STS/SVM Guidelines for the Diagnosis and Management of Patients With Thoracic Aortic Disease. Circulation. 2010; 121: X902-I097.  Aortic Atherosclerosis (ICD10-I70.0). Aortic aneurysm NOS (ICD10-I71.9).    07/04/2018 - 10/16/2018 Chemotherapy     FOLFIRINOX q2 weeks    07/15/2018 Imaging    07/15/2018 Liver US IMPRESSION: No liver lesions identified with ultrasound. Ultrasound-guided biopsy was not performed. Recommend further characterization for liver lesions with a repeat MRI, with and without contrast.    07/24/2018 Imaging    07/24/2018 MRI Abdomen IMPRESSION: 1. The hypoenhancing mass at the junction of the pancreatic body and head has reduced in size, previously 3.2 by 2.8 cm and currently 2.9 by 2.2 cm. A small peripancreatic lymph node adjacent to the mass was previously 0.9 cm in short axis and is currently 0.7 cm in short axis. The amount of contact between the pancreatic mass and the confluence of the splenic vein and SMV is similar to the prior exam. 2. There is a 6 mm probable hemangioma in segment 4a of the liver, based on the delayed enhancement pattern. This is somewhat ill-defined. The lesion appeared larger on the prior noncontrast CT but presumably may have been overestimated on that exam. There is also some hypodensity along the dome of the right hepatic lobe which appears to most likely be due to a slip of the diaphragm rather than a discernible lesion on MRI. 3. Aortic Atherosclerosis (ICD10-I70.0). Notably, there is a small amount of mural thrombus along the right side of  the abdominal aorta below the right renal artery level which has not been seen previously.    09/03/2018 Imaging    09/03/2018 MRI Abdomen IMPRESSION: 1. Stable mass (adenocarcinoma) in the neck of the pancreas with upstream duct dilatation. 2. No evidence of lymphadenopathy in the porta hepatis or peripancreatic fat. 3. No evidence hepatic metastasis. 4. Mild biliary duct dilatation LEFT hepatic lobe similar to comparison exam. Biliary stent within the common bile duct.    09/16/2018 Imaging    MR MRA CHEST W WO CONTRAST  IMPRESSION: VASCULAR  1. 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 2. Left subclavian port catheter to the SVC. 3. SVC is patent      10/24/2018 Imaging    MRI abdomen  IMPRESSION: 1. Slight interval decrease in size of the pancreatic mass. 2. Stable to slightly smaller adjacent lymph nodes. 3. No findings for metastatic disease involving the liver or lung bases. 4. Common bile duct stent in good position without complicating features.    11/10/2018 Surgery    PAC removal, Laparoscopic Cholecystectomy and Diagnostic Laparoscopic Liver Biopsy by Dre. Byerly  and Dr. Lucia Gaskins  11/10/18     11/10/2018 Pathology Results    Diagnosis 11/10/18 1. Liver, biopsy, Left - ADENOCARCINOMA. - SEE COMMENT. 2. Gallbladder - CHRONIC CHOLECYSTITIS WITH CHOLELITHIASIS. - ONE MORPHOLOGICALLY BENIGN LYMPH NODE.    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  The CustomNext-Expanded gene panel offered by [facility] and includes sequencing and rearrangement analysis for the following 81 genes: AIP, ALK, APC*, ATM*, AXIN2, BAP1, BARD1, BLM, BMPR1A, BRCA1*, BRCA2*, BRIP1*, CDC73, CDH1*, CDK4, CDKN1B, CDKN2A, CHEK2*,  CTNNA1, DICER1, FANCC, FH, FLCN, GALNT12, HOXB13, KIT, MAX, MEN1, MET, MLH1*, MRE11A, MSH2*, MSH6*, MUTYH*, NBN, NF1*, NF2, NTHL1, PALB2*, PDGFRA, PHOX2B, PMS2*, POLD1, POLE, POT1, PRKAR1A, PTCH1, PTEN*, RAD50, RAD51C*, RAD51D*, RB1, RET, SDHA, SDHAF2, SDHB, SDHC, SDHD, SMAD4, SMARCA4, SMARCB1, SMARCE1, STK11, SUFU, TMEM127, TP53*, TSC1, TSC2, VHL and XRCC2 (sequencing and deletion/duplication); CASR, CFTR, CPA1, CTRC, EGFR, MITF, PRSS1 and SPINK1 (sequencing only); EPCAM and GREM1 (deletion/duplication only). DNA and RNA analyses performed for * genes. The report date is Dec 19, 2018.     CURRENT THERAPY: second line systemic chemotherapy with gemcitabine and abraxane 2-3 weeks on and 1 week off, starting 12/22/18   INTERVAL HISTORY: Mr Revoir presents today for f/u as scheduled to begin next line chemo. JP drain was removed per IR on 12/10/18 and he has healed well. Has occasional RUQ pressure if he is lying on right side. He reports 2 weeks of constant lower abdominal pain and bloating. He rates pain 4/10. He notes constipation that began 2-3 weeks ago. He is passing small amount of gas. Having little bowel movements every other day. Uses mirlax daily and stool softener BID. Abdominal pain increases if no BM. Also notes pain during intercourse. Denies blood in stool. Has occasional nausea, no vomiting. Occasional has pain on urination if he "waits too long" to void. No frequency or hematuria. Appetite is fair, drinking well. Denies fever, chills, cough, chest pain, dyspnea, leg swelling.    REVIEW OF SYSTEMS:   Constitutional: Denies fevers, chills or abnormal weight loss Ears, nose, mouth, throat, and face: Denies mucositis or sore throat Respiratory: Denies cough, dyspnea or wheezes Cardiovascular: Denies palpitation, chest discomfort or lower extremity swelling Gastrointestinal:  Denies diarrhea, vomiting, GI bleeding, heartburn or change in bowel habits (+) lower abd pain (+) constipation  GU:  Denies hematuria, frequency (+) dysuria  Skin: Denies abnormal skin rashes Lymphatics: Denies new lymphadenopathy or easy bruising Neurological:Denies numbness, tingling or new weaknesses (+) mild peripheral neuropathy  Behavioral/Psych: Mood is stable, no new changes  All other systems were reviewed with the patient and are negative.  MEDICAL HISTORY:  Past Medical History:  Diagnosis Date   Anxiety    Cancer ([place]) 06/2018   Pancreatic   Family history of breast cancer    Family history of colonic polyps    Family history of pancreatic cancer    Family history of prostate cancer    GERD (gastroesophageal reflux disease)     SURGICAL HISTORY: Past Surgical History:  Procedure Laterality Date   BILIARY STENT PLACEMENT  06/10/2018   Procedure: BILIARY STENT PLACEMENT;  Surgeon: Ronnette Juniper, MD;  Location: [place];  Service: Gastroenterology;;   CHOLECYSTECTOMY N/A 11/10/2018   Procedure: Laparoscopic Cholecystectomy;  Surgeon: Stark Klein, MD;  Location: [place];  Service: General;  Laterality: N/A;   DIAGNOSTIC LAPAROSCOPIC LIVER BIOPSY N/A 11/10/2018   Procedure: Diagnostic Laparoscopic Liver Biopsy;  Surgeon: Stark Klein, MD;  Location: [place];  Service: General;  Laterality: N/A;   ERCP N/A 06/10/2018   Procedure: ENDOSCOPIC RETROGRADE CHOLANGIOPANCREATOGRAPHY (ERCP);  Surgeon: Ronnette Juniper, MD;  Location: [place];  Service: Gastroenterology;  Laterality: N/A;  ESOPHAGOGASTRODUODENOSCOPY (EGD) WITH PROPOFOL N/A 06/10/2018   Procedure: ESOPHAGOGASTRODUODENOSCOPY (EGD) WITH PROPOFOL;  Surgeon: Ronnette Juniper, MD;  Location: [place];  Service: Gastroenterology;  Laterality: N/A;   FINE NEEDLE ASPIRATION  06/10/2018   Procedure: FINE NEEDLE ASPIRATION (FNA) LINEAR;  Surgeon: Ronnette Juniper, MD;  Location[facility] END[facility]COPY;  Service: Gastroenterology;;   IR RADIOLOGIST EVAL & MGMT  12/10/2018   PORT-A-CATH REMOVAL N/A 11/10/2018   Procedure: REMOVAL PORT-A-CATH;  Surgeon:  Stark Klein, MD;  Location: [place];  Service: General;  Laterality: N/A;   PORTACATH PLACEMENT N/A 07/02/2018   Procedure: INSERTION PORT-A-CATH;  Surgeon: Stark Klein, MD;  Location: [place];  Service: General;  Laterality: N/A;   SPHINCTEROTOMY  06/10/2018   Procedure: SPHINCTEROTOMY;  Surgeon: Ronnette Juniper, MD;  Location[facility] END[facility]COPY;  Service: Gastroenterology;;   UPPER ESOPHAGEAL ENDOSCOPIC ULTRASOUND (EUS) N/A 06/10/2018   Procedure: UPPER ESOPHAGEAL ENDOSCOPIC ULTRASOUND (EUS);  Surgeon: Ronnette Juniper, MD;  Location: [place];  Service: Gastroenterology;  Laterality: N/A;   VASECTOMY      I have reviewed the social history and family history with the patient and they are unchanged from previous note.  ALLERGIES:  is allergic to contrast media [iodinated diagnostic agents] and iohexol.  MEDICATIONS:  Current Outpatient Medications  Medication Sig Dispense Refill   diphenoxylate-atropine (LOMOTIL) 2.5-0.025 MG tablet Take 2 tablets by mouth 4 (four) times daily as needed for diarrhea or loose stools. 40 tablet 0   docusate sodium (COLACE) 100 MG capsule Take 200 mg by mouth daily as needed for mild constipation.     esomeprazole (NEXIUM) 20 MG capsule Take 20 mg by mouth daily after breakfast.      folic acid (FOLVITE) 1 MG tablet Take 1 tablet (1 mg total) by mouth daily after breakfast. 30 tablet 3   magic mouthwash w/lidocaine SOLN Take 5 mLs by mouth 4 (four) times daily as needed for mouth pain. 240 mL 2   rivaroxaban (XARELTO) 20 MG TABS tablet Take 1 tablet (20 mg total) by mouth daily with supper. (Patient taking differently: Take 20 mg by mouth daily after breakfast. ) 30 tablet 2   simethicone (MYLICON) 413 MG chewable tablet Chew 250 mg by mouth every 6 (six) hours as needed for flatulence.      Syringe, Disposable, (10-12CC SYRINGE) 12 ML MISC 10 mLs by Does not apply route as needed. 1 each 1   ondansetron (ZOFRAN) 8 MG tablet Take 1 tablet (8 mg total) by  mouth every 8 (eight) hours as needed for nausea or vomiting. 30 tablet 1   prochlorperazine (COMPAZINE) 10 MG tablet Take 1 tablet (10 mg total) by mouth every 6 (six) hours as needed (NAUSEA). 30 tablet 1   No current facility-administered medications for this visit.    Facility-Administered Medications Ordered in Other Visits  Medication Dose Route Frequency Provider Last Rate Last Dose   gemcitabine (GEMZAR) 2,166 mg in sodium chloride 0.9 % 250 mL chemo infusion  1,000 mg/m2 (Treatment Plan Recorded) Intravenous Once [name], MD 614 mL/hr at 12/22/18 1634 2,166 mg at 12/22/18 1634   PACLitaxel-protein bound (ABRAXANE) chemo infusion 275 mg  125 mg/m2 (Treatment Plan Recorded) Intravenous Once [name], MD        PHYSICAL EXAMINATION: ECOG PERFORMANCE STATUS: 1 - Symptomatic but completely ambulatory  Vitals:   12/22/18 1442  BP: 136/83  Pulse: 81  Resp: 18  Temp: 98.5 F (36.9 C)  SpO2: 100%   Filed Weights   12/22/18 1442  Weight:  188 lb 3.2 oz (85.4 kg)    GENERAL:alert, no distress and comfortable SKIN: no rashes  EYES:  sclera clear OROPHARYNX:no thrush or ulcers   LYMPH:  no palpable cervical or supraclavicular lymphadenopathy  LUNGS: clear to auscultation with normal breathing effort HEART: regular rate & rhythm, no lower extremity edema ABDOMEN: abdomen soft, bowel sounds are present. Mild tenderness to suprapubic region. No palpable mass. Surgical incisions are healing well  NEURO: nonfocal  LABORATORY DATA:  I have reviewed the data as listed CBC Latest Ref Rng & Units 12/22/2018 12/08/2018 11/22/2018  WBC 4.0 - 10.5 K/uL 5.7 5.4 8.9  Hemoglobin 13.0 - 17.0 g/dL 12.5(L) 12.2(L) 11.5(L)  Hematocrit 39.0 - 52.0 % 38.4(L) 37.9(L) 36.3(L)  Platelets 150 - 400 K/uL 164 250 226     CMP Latest Ref Rng & Units 12/22/2018 12/08/2018 11/24/2018  Glucose 70 - 99 mg/dL 147(H) 159(H) 115(H)  BUN 6 - 20 mg/dL 7 8 7   Creatinine 0.61 - 1.24 mg/dL 0.79 0.78 0.64  Sodium  135 - 145 mmol/L 139 138 138  Potassium 3.5 - 5.1 mmol/L 3.9 3.9 3.6  Chloride 98 - 111 mmol/L 104 103 105  CO2 22 - 32 mmol/L 26 28 26   Calcium 8.9 - 10.3 mg/dL 8.9 9.0 7.9(L)  Total Protein 6.5 - 8.1 g/dL 7.0 7.3 -  Total Bilirubin 0.3 - 1.2 mg/dL 0.3 0.3 -  Alkaline Phos 38 - 126 U/L 107 133(H) -  AST 15 - 41 U/L 20 38 -  ALT 0 - 44 U/L 28 76(H) -      RADIOGRAPHIC STUDIES: I have personally reviewed the radiological images as listed and agreed with the findings in the report. No results found.   ASSESSMENT & PLAN: Ernest Wu is a 58 y.o. male with   1. Adenocarcinoma of the pancreas,cT3N1M0, borderline resectable, liver metastasis in 11/2018 -Diagnosed in 06/2018. Treated with neoadjuvant chemo FOLFIRINOX.  -He underwent Cholecystectomy and liver biopsy on 11/10/18 with Dr. Barry Dienes and Whipple surgery was aborted due to new liver mets found during surgery.  -after his surgery he developed an abscess at surgical site which caused sepsis secondary to GNR bacteremia. He was treated with antibiotics and JP drain. drain removed per IR on 12/10/18 -He has recovered well, VSS, lab reviewed. CBC and CMP are stable. Mild anemia, BG 147; otherwise labs unremarkable -He developed lower abdominal pain 2 weeks ago, and constipation. Pain is worse if no BM.  -CT on 12/10/18 showed no obstruction, hernia, or abdominopelvic abnormality. I feel his pain is related to constipation. I recommend to increase miralax to BID and continue colace.  -he reports mild intermittent dysuria, will r/o UTI today. -We reviewed new regimen with Gemcitabine/abraxane 2-3 weeks on and 1 week off; he agreed to proceed today -while in infusion area, he became reluctant to get chemo today; he met with Dr. Burr Medico directly and ultimately agreed to gemcitabine only today. He will discuss further with his wife.  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 Constipation -Secondary to chemo and premedications. -He takes Miralax daily and colace BID -constipation worsened in the last 2-3 weeks and he developed abdominal pain; pain worsens if no BM -I recommend to increase miralax to BID and continue colace  -I  encouraged him to eat and drink adequately   5. Sepsis secondary to Hafnia bacteriemia, s/p JP drain placement  -treated with antibiotics and draining of abdominal abscess.  -drain removed per IR on 12/10/18 -drain site has healed well   Plan -Labs reviewed, proceed with cycle 1 day 1 chemo today, gemcitabine only -lab, f/u, chemo in 1 week  -Rx: compazine and zofran  -increase miralax to BID, continue colace  -monitor abdominal pain  -UA pending    Orders Placed This Encounter  Procedures   Urinalysis, Complete w Microscopic    Standing Status:   Future    Number of Occurrences:   1    Standing Expiration Date:   12/22/2019   All questions were answered. The patient knows to call the clinic with any problems, questions or concerns. No barriers to learning was detected. I spent 20 minutes counseling the patient face to face. The total time spent in the appointment was 25 minutes and more than 50% was on counseling and review of test results     Alla Feeling, NP 12/22/18

## 2018-12-22 NOTE — Patient Instructions (Addendum)
[place] Discharge Instructions for Patients Receiving Chemotherapy  Today you received the following chemotherapy agents: Abraxane and Gemzar   To help prevent nausea and vomiting after your treatment, we encourage you to take your nausea medication as directed.    If you develop nausea and vomiting that is not controlled by your nausea medication, call the clinic.   BELOW ARE SYMPTOMS THAT SHOULD BE REPORTED IMMEDIATELY:  *FEVER GREATER THAN 100.5 F  *CHILLS WITH OR WITHOUT FEVER  NAUSEA AND VOMITING THAT IS NOT CONTROLLED WITH YOUR NAUSEA MEDICATION  *UNUSUAL SHORTNESS OF BREATH  *UNUSUAL BRUISING OR BLEEDING  TENDERNESS IN MOUTH AND THROAT WITH OR WITHOUT PRESENCE OF ULCERS  *URINARY PROBLEMS  *BOWEL PROBLEMS  UNUSUAL RASH Items with * indicate a potential emergency and should be followed up as soon as possible.  Feel free to call the clinic should you have any questions or concerns. The clinic phone number is [phone].  Please show the [place] at check-in to the Emergency Department and triage nurse.  Gemcitabine injection What is this medicine? GEMCITABINE (jem SYE ta been) is a chemotherapy drug. This medicine is used to treat many types of cancer like breast cancer, lung cancer, pancreatic cancer, and ovarian cancer. This medicine may be used for other purposes; ask your health care provider or pharmacist if you have questions. COMMON BRAND NAME(S): Gemzar, Infugem What should I tell my health care provider before I take this medicine? They need to know if you have any of these conditions: -blood disorders -infection -kidney disease -liver disease -lung or breathing disease, like asthma -recent or ongoing radiation therapy -an unusual or allergic reaction to gemcitabine, other chemotherapy, other medicines, foods, dyes, or preservatives -pregnant or trying to get pregnant -breast-feeding How should I use this medicine? This  drug is given as an infusion into a vein. It is administered in a hospital or clinic by a specially trained health care professional. Talk to your pediatrician regarding the use of this medicine in children. Special care may be needed. Overdosage: If you think you have taken too much of this medicine contact a poison control center or emergency room at once. NOTE: This medicine is only for you. Do not share this medicine with others. What if I miss a dose? It is important not to miss your dose. Call your doctor or health care professional if you are unable to keep an appointment. What may interact with this medicine? -medicines to increase blood counts like filgrastim, pegfilgrastim, sargramostim -some other chemotherapy drugs like cisplatin -vaccines Talk to your doctor or health care professional before taking any of these medicines: -acetaminophen -aspirin -ibuprofen -ketoprofen -naproxen This list may not describe all possible interactions. Give your health care provider a list of all the medicines, herbs, non-prescription drugs, or dietary supplements you use. Also tell them if you smoke, drink alcohol, or use illegal drugs. Some items may interact with your medicine. What should I watch for while using this medicine? Visit your doctor for checks on your progress. This drug may make you feel generally unwell. This is not uncommon, as chemotherapy can affect healthy cells as well as cancer cells. Report any side effects. Continue your course of treatment even though you feel ill unless your doctor tells you to stop. In some cases, you may be given additional medicines to help with side effects. Follow all directions for their use. Call your doctor or health care professional for advice if you get a fever,  chills or sore throat, or other symptoms of a cold or flu. Do not treat yourself. This drug decreases your body's ability to fight infections. Try to avoid being around people who are  sick. This medicine may increase your risk to bruise or bleed. Call your doctor or health care professional if you notice any unusual bleeding. Be careful brushing and flossing your teeth or using a toothpick because you may get an infection or bleed more easily. If you have any dental work done, tell your dentist you are receiving this medicine. Avoid taking products that contain aspirin, acetaminophen, ibuprofen, naproxen, or ketoprofen unless instructed by your doctor. These medicines may hide a fever. Do not become pregnant while taking this medicine or for 6 months after stopping it. Women should inform their doctor if they wish to become pregnant or think they might be pregnant. Men should not father a child while taking this medicine and for 3 months after stopping it. There is a potential for serious side effects to an unborn child. Talk to your health care professional or pharmacist for more information. Do not breast-feed an infant while taking this medicine or for at least 1 week after stopping it. Men should inform their doctors if they wish to father a child. This medicine may lower sperm counts. Talk with your doctor or health care professional if you are concerned about your fertility. What side effects may I notice from receiving this medicine? Side effects that you should report to your doctor or health care professional as soon as possible: -allergic reactions like skin rash, itching or hives, swelling of the face, lips, or tongue -breathing problems -pain, redness, or irritation at site where injected -signs and symptoms of a dangerous change in heartbeat or heart rhythm like chest pain; dizziness; fast or irregular heartbeat; palpitations; feeling faint or lightheaded, falls; breathing problems -signs of decreased platelets or bleeding - bruising, pinpoint red spots on the skin, black, tarry stools, blood in the urine -signs of decreased red blood cells - unusually weak or tired,  feeling faint or lightheaded, falls -signs of infection - fever or chills, cough, sore throat, pain or difficulty passing urine -signs and symptoms of kidney injury like trouble passing urine or change in the amount of urine -signs and symptoms of liver injury like dark yellow or brown urine; general ill feeling or flu-like symptoms; light-colored stools; loss of appetite; nausea; right upper belly pain; unusually weak or tired; yellowing of the eyes or skin -swelling of ankles, feet, hands Side effects that usually do not require medical attention (report to your doctor or health care professional if they continue or are bothersome): -constipation -diarrhea -hair loss -loss of appetite -nausea -rash -vomiting This list may not describe all possible side effects. Call your doctor for medical advice about side effects. You may report side effects to FDA at 1-800-FDA-1088. Where should I keep my medicine? This drug is given in a hospital or clinic and will not be stored at home. NOTE: This sheet is a summary. It may not cover all possible information. If you have questions about this medicine, talk to your doctor, pharmacist, or health care provider.  2019 Elsevier/Gold Standard (2017-10-16 18:06:11)

## 2018-12-22 NOTE — Telephone Encounter (Signed)
Scheduled appt per 5/18 los. ° °

## 2018-12-22 NOTE — Progress Notes (Signed)
Before treatment began, educated patient on chemotherapy he would receive today. Informed patient of potential side effects. Patient concerned about hair loss and requested to speak to Dr. Burr Medico. Cira Rue NP and Dr. Burr Medico came to treatment room to see patient. Patient decided to not receive Abraxane today. Dr. Burr Medico advised to continue with gemcitabine. Patient agrees to plan of care.

## 2018-12-22 NOTE — Progress Notes (Addendum)
GENETIC TEST RESULTS   Patient Name: Ernest Wu Patient Age: 58 y.o. Encounter Date: 12/22/2018  Referring Provider: [name], MD    Ernest Wu was seen in the [facility] clinic on Dec 22, 2018 due to a personal and family history of cancer and concern regarding a hereditary predisposition to cancer in the family. Please refer to the prior Genetics clinic note for more information regarding Ernest Wu medical and family histories and our assessment at the time.   FAMILY HISTORY:  We obtained a detailed, 4-generation family history.  Significant diagnoses are listed below: Family History  Problem Relation Age of Onset   Stroke Mother    Hypertension Mother    CAD Father        CABG   Pancreatic cancer Paternal Grandfather    Breast cancer Maternal Aunt    Lymphoma Paternal Uncle    Other Maternal Uncle        [name]   Stroke Maternal Grandmother    Prostate cancer Cousin        pat first cousin, dx between 27-60    The patient has three children, two daughters and a son, who are cancer free. He has a brother and sister who are cancer free.  Both parents are living.  The patient's father had colon polyps but has not had cancer.  He had two brothers, one had lymphoma and died and the other had a son with prostate cancer.  The paternal grandparents are deceased.  The grandfather had pancreatic cancer and the grandmother died of natural causes.  The patient's mother is living and has not had cancer.  She had three sisters and a brother.  The brother died in [place] war, and one sister had breast cancer in her late 12's.  This sister was married to the patient's paternal uncle who had lymphoma.  The maternal grandparents are deceased and ddi not have cancer.  Ernest Wu is unaware of previous family history of genetic testing for hereditary cancer risks. Patient's maternal ancestors are of Caucasian descent, and paternal ancestors are of Caucasian descent. There  is no reported Ashkenazi Jewish ancestry. There is no known consanguinity.  GENETIC TESTING:  At the time of Ernest Wu visit, we recommended he pursue genetic testing of the [employer] test. 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  There were no deleterious mutations in AIP, ALK, APC*, ATM*, AXIN2, BAP1, BARD1, BLM, BMPR1A, BRCA1*, BRCA2*, BRIP1*, CDC73, CDH1*, CDK4, CDKN1B, CDKN2A, CHEK2*, CTNNA1, DICER1, FANCC, FH, FLCN, GALNT12, HOXB13, KIT, MAX, MEN1, MET, MLH1*, MRE11A, MSH2*, MSH6*, MUTYH*, NBN, NF1*, NF2, NTHL1, PALB2*, PDGFRA, PHOX2B, PMS2*, POLD1, POLE, POT1, PRKAR1A, PTCH1, PTEN*, RAD50, RAD51C*, RAD51D*, RB1, RET, SDHA, SDHAF2, SDHB, SDHC, SDHD, SMAD4, SMARCA4, SMARCB1, SMARCE1, STK11, SUFU, TMEM127, TP53*, TSC1, TSC2, VHL and XRCC2 (sequencing and deletion/duplication); CASR, CPA1, CTRC, EGFR, MITF, PRSS1 and SPINK1 (sequencing only); EPCAM and GREM1 (deletion/duplication only). DNA and RNA analyses performed for * genes.    CLINICAL CONDITION: The CFTR gene is associated with autosomal recessive cystic fibrosis. Other CFTR-related disorders include congenital bilateral absence of the vas deferens (CBAVD; MedGen UID: 42353) and an increased risk for pancreatitis (PMID: 61443154, 00867619).  Cystic fibrosis (CF) is characterized by the buildup of mucus that can damage the digestive, respiratory, and reproductive systems (PMID: 50932671).  The severity of CF ranges from mild to severe and there is significant variability in presentation, even within a family (PMID: 76195093, 26712458, 09983382). Symptoms include chronic cough, recurring chest colds, wheezing, shortness of breath, recurring sinus infections, excessive sweating, and poor growth. In classic CF, symptoms  begin in early childhood, with average life expectancy in the 63's. As the disease progresses, mucus buildup and recurring infections lead to irreparable lung damage and pulmonary complications, which are considered to be the primary cause of mortality (Cystic Fibrosis Foundation. http://www.anderson-foster.com/. ?Accessed September 03, 2016). In addition to pulmonary disease, around 80-90% of individuals with classic CF have pancreatic insufficiency, which causes digestive problems, malnutrition, and poor growth (PMID: 50539767). Other symptoms include meconium ileus at birth (~20% of affected newborns), diabetes (20% of adolescents; 40-50% of adults), liver disease (5-10% of affected individuals in the first decade of life), and male infertility (>95% of affected males; PMID: [phone], 29924268, 34196222).  In mild cases of CF, symptoms may present later in life, with less severity, and may not impact life expectancy (PMID: 97989211).  CBAVD is associated with male infertility and refers to bilateral hypoplasia or aplasia of the vas deferens and seminal vesicles. Men are typically azoospermic but have normal testicular function and spermatogenesis, and are therefore able to have biological children with assisted reproductive technology (PMID: 94174081).  Hereditary pancreatitis is characterized by recurring episodes of acute inflammation of the pancreas beginning in childhood or adolescence and leading to chronic pancreatitis. The clinical presentation is highly variable and may include chronic abdominal pain, impairment of endocrine and exocrine pancreatic function, nausea and vomiting, maldigestion, and diabetes (KGYJ:85631497). Pathogenic variants in CFTR may confer an approximately four- to ten-fold increased risk for chronic pancreatitis in heterozygous carriers. Other genetic and environmental risk factors are also known to modify this risk (PMID: 02637858, 8502774, 12878676, 72094709, 62836629, 47654650,  35465681). Chronic pancreatitis is a risk factor for pancreatic cancer (PMID: 27517001).  MANAGEMENT OF CARRIERS: Treatment for hereditary pancreatitis primarily focuses on pain management, maldigestion, and monitoring for diabetes and pancreatic cancer (PMID: 74944967). Adhering to a low-fat diet, eating small and frequent meals, taking enzyme supplements, keeping hydrated, and avoiding alcohol and smoking are advised (PMID: 59163846). In some cases, surgery (including total pancreatectomy with islet autotransplantation) may be warranted (PMID: 65993570). Specific recommendations exist for the treatment of pancreatic diabetes (PMID: 17793903).  Carriers are also at an increased risk of having a child affected with CF. For those of reproductive age, partners of known carriers should consider genetic testing to determine their reproductive risk. Around 1 in 58 people in the [name] are carriers of CF ([place]. http://www.anderson-foster.com/. ?Accessed September 03, 2016); however, this risk varies slightly based on ethnicity. Reproductive options are available for at-risk carrier couples, including prenatal diagnosis, IVF with preimplantation genetic diagnosis (PGD), gamete donation, and adoption. Additionally, newborn screening for CF is available in every state.  INHERITANCE: Cystic fibrosis and CBAVD exhibit autosomal recessive inheritance, and the affected individuals have two pathogenic variants-one in each copy of their CFTR genes. Affected individuals will pass one pathogenic CFTR variant to all of their children. For partners who each carry a pathogenic variant in CFTR, the risk of having have an affected child is 25% (per pregnancy).  The increased risk for hereditary pancreatitis follows an autosomal dominant pattern of inheritance. This means that an individual with a single pathogenic variant has a 50% chance of passing along that variant and the increased disease risk to their offspring.  Family  members of individuals with pathogenic CFTR variants may consider genetic testing, as they may be carriers as well.  FAMILY MEMBERS: It is important that all of Mr. Disney relatives (both men and women) know of the presence of this gene mutation. Site-specific genetic testing can sort out who in the family is at risk and who is not.   Mr. Encarnacion children and siblings have a 50% chance to have inherited this mutation. We recommend they have genetic testing for this same mutation, as identifying the presence of this mutation would allow them to also take advantage of risk-reducing measures.   We encouraged Mr. Merced to remain in contact with [name] on an annual basis so we can update his personal and family histories, and let him know of advances in cancer genetics that may benefit the family. Our contact number was provided. Mr. Quest questions were answered to his satisfaction today, and he knows he is welcome to call anytime with additional questions.   Garry Bochicchio P. [place], [place], [facility] Certified Genetic Counselor [email or web address] phone: [phone]  The patient was seen for a total of 15 minutes in face-to-face genetic counseling.

## 2018-12-23 ENCOUNTER — Telehealth: Payer: Self-pay | Admitting: *Deleted

## 2018-12-23 LAB — CANCER ANTIGEN 19-9: CA 19-9: 22 U/mL (ref 0–35)

## 2018-12-23 NOTE — Telephone Encounter (Signed)
Notified pt of results of u/a per Cira Rue NP.  Pt reports some burning with urination.  Instructed pt to cont to drink plenty of fluids & try cranberry juice & to call if symptoms worsening.

## 2018-12-23 NOTE — Telephone Encounter (Signed)
-----   Message from Alla Feeling, NP sent at 12/23/2018  9:22 AM EDT ----- Please let him know no urinary tract infection.  Thanks, Regan Rakers NP

## 2018-12-23 NOTE — Telephone Encounter (Signed)
Called pt back & discussed chemo treatment yest.  He states he is eating, & drinking OK without n/v or diarrhea.  He is having some constipation & taking miralax bid.  Informed to call if no BM for 3 days.  Suggested increasing oral fluids, fiber, etc.  Pt expressed understanding.

## 2018-12-23 NOTE — Telephone Encounter (Signed)
-----   Message from Sinda Du, RN sent at 12/22/2018  4:42 PM EDT ----- Regarding: Dr. Burr Medico - 1st chemo f/u New regimen (received Gemzar only)

## 2018-12-26 ENCOUNTER — Emergency Department (HOSPITAL_COMMUNITY): Payer: Managed Care, Other (non HMO)

## 2018-12-26 ENCOUNTER — Telehealth: Payer: Self-pay

## 2018-12-26 ENCOUNTER — Emergency Department (HOSPITAL_COMMUNITY)
Admission: EM | Admit: 2018-12-26 | Discharge: 2018-12-26 | Disposition: A | Discharge status: Home / Self Care | Payer: Managed Care, Other (non HMO) | Attending: Emergency Medicine | Admitting: Emergency Medicine

## 2018-12-26 ENCOUNTER — Encounter (HOSPITAL_COMMUNITY): Payer: Self-pay | Admitting: Emergency Medicine

## 2018-12-26 ENCOUNTER — Other Ambulatory Visit: Payer: Self-pay

## 2018-12-26 DIAGNOSIS — R1084 Generalized abdominal pain: Secondary | ICD-10-CM

## 2018-12-26 DIAGNOSIS — R1013 Epigastric pain: Secondary | ICD-10-CM | POA: Diagnosis present

## 2018-12-26 DIAGNOSIS — Z8507 Personal history of malignant neoplasm of pancreas: Secondary | ICD-10-CM | POA: Insufficient documentation

## 2018-12-26 DIAGNOSIS — Z79899 Other long term (current) drug therapy: Secondary | ICD-10-CM | POA: Insufficient documentation

## 2018-12-26 DIAGNOSIS — K5900 Constipation, unspecified: Secondary | ICD-10-CM | POA: Insufficient documentation

## 2018-12-26 DIAGNOSIS — F17210 Nicotine dependence, cigarettes, uncomplicated: Secondary | ICD-10-CM | POA: Diagnosis not present

## 2018-12-26 LAB — URINALYSIS, ROUTINE W REFLEX MICROSCOPIC
Bilirubin Urine: NEGATIVE
Glucose, UA: NEGATIVE mg/dL
Hgb urine dipstick: NEGATIVE
Ketones, ur: NEGATIVE mg/dL
Leukocytes,Ua: NEGATIVE
Nitrite: NEGATIVE
Protein, ur: NEGATIVE mg/dL
Specific Gravity, Urine: 1.003 — ABNORMAL LOW (ref 1.005–1.030)
pH: 6 (ref 5.0–8.0)

## 2018-12-26 LAB — COMPREHENSIVE METABOLIC PANEL
ALT: 67 U/L — ABNORMAL HIGH (ref 0–44)
AST: 60 U/L — ABNORMAL HIGH (ref 15–41)
Albumin: 3.6 g/dL (ref 3.5–5.0)
Alkaline Phosphatase: 101 U/L (ref 38–126)
Anion gap: 10 (ref 5–15)
BUN: 11 mg/dL (ref 6–20)
CO2: 25 mmol/L (ref 22–32)
Calcium: 8.7 mg/dL — ABNORMAL LOW (ref 8.9–10.3)
Chloride: 99 mmol/L (ref 98–111)
Creatinine, Ser: 0.62 mg/dL (ref 0.61–1.24)
GFR calc Af Amer: >60 mL/min (ref >60)
GFR calc non Af Amer: >60 mL/min (ref >60)
Glucose, Bld: 120 mg/dL — ABNORMAL HIGH (ref 70–99)
Potassium: 3.8 mmol/L (ref 3.5–5.1)
Sodium: 134 mmol/L — ABNORMAL LOW (ref 135–145)
Total Bilirubin: 0.5 mg/dL (ref 0.3–1.2)
Total Protein: 7.5 g/dL (ref 6.5–8.1)

## 2018-12-26 LAB — CBC WITH DIFFERENTIAL/PLATELET
Abs Immature Granulocytes: 0.02 10*3/uL (ref 0.00–0.07)
Basophils Absolute: 0 10*3/uL (ref 0.0–0.1)
Basophils Relative: 0 %
Eosinophils Absolute: 0.1 10*3/uL (ref 0.0–0.5)
Eosinophils Relative: 2 %
HCT: 35.2 % — ABNORMAL LOW (ref 39.0–52.0)
Hemoglobin: 11.3 g/dL — ABNORMAL LOW (ref 13.0–17.0)
Immature Granulocytes: 1 %
Lymphocytes Relative: 12 %
Lymphs Abs: 0.5 10*3/uL — ABNORMAL LOW (ref 0.7–4.0)
MCH: 29.7 pg (ref 26.0–34.0)
MCHC: 32.1 g/dL (ref 30.0–36.0)
MCV: 92.4 fL (ref 80.0–100.0)
Monocytes Absolute: 0 10*3/uL — ABNORMAL LOW (ref 0.1–1.0)
Monocytes Relative: 1 %
Neutro Abs: 3.7 10*3/uL (ref 1.7–7.7)
Neutrophils Relative %: 84 %
Platelets: 171 10*3/uL (ref 150–400)
RBC: 3.81 MIL/uL — ABNORMAL LOW (ref 4.22–5.81)
RDW: 13.9 % (ref 11.5–15.5)
WBC: 4.3 10*3/uL (ref 4.0–10.5)
nRBC: 0 % (ref 0.0–0.2)

## 2018-12-26 LAB — LIPASE, BLOOD: Lipase: 23 U/L (ref 11–51)

## 2018-12-26 NOTE — ED Triage Notes (Signed)
Pt c/o abd pains for couple weeks. Reports called cancer MD today who advised to go to ED for further eval to rule out blockage. Has pancreatic cancer.

## 2018-12-26 NOTE — ED Provider Notes (Signed)
Patient care was taken over from [facility], [place].  Patient has a history of pancreatic cancer and presented today with worsening abdominal pain.  He has had some associated constipation.  He says his pain is been going on for the last 2 3 days and was worse yesterday but today actually seems to be better.  He did have a small bowel movement yesterday.  He has no nausea or vomiting.  On my exam, his abdomen is benign and nontender.  His labs are non-concerning.  There is a slight elevation in his LFTs.  His CT scan shows small amount of new ascites with some stranding of his omentum which could be peritonitis versus carcinomatosis.  I have a low suspicion of peritonitis given his abdominal exam.  His white count is normal.  He is afebrile.  Dr. Burr Medico who is the patient's oncologist has seen the patient in the emergency department today and agrees that patient can be discharged home.  He has follow-up with her on Tuesday.  Patient was given strict return precautions.   Malvin Johns, MD 12/26/18 [phone]

## 2018-12-26 NOTE — ED Provider Notes (Signed)
[facility] DEPT Provider Note   CSN: [id number] Arrival date & time: 12/26/18  1326    History   Chief Complaint Chief Complaint  Patient presents with  . Abdominal Pain    HPI Ernest Wu is a 58 y.o. male.     Patient is a 58 year old gentleman with past medical history of anxiety, pancreatic cancer on chemo, presenting to the emergency department for increasing abdominal pain over the last week.  Patient reports that he has had increased pain in the epigastric and sides of his abdomen.  Reports that it is worse with eating too much and relieved with having a bowel movement. He has had some issues with constipation over the last couple weeks and is on a daily bowel regimen.  Reports having a bowel movement this morning that was soft but was very small.  He called the oncologist today who was concerned for possible bowel obstruction and told him to come to the emergency department.  Patient reports that currently his pain is a 3 out of 10.  Denies any nausea, vomiting, diarrhea, blood in stool.  Denies any fever, chills, hematuria.  He does note that he has had some dysuria over the last week.  Reports that he had a urinalysis on Monday but it was normal.  Reports unremarkable labs that were drawn before chemo his last session this past Monday.     Past Medical History:  Diagnosis Date  . Anxiety   . Cancer ([place]) 06/2018   Pancreatic  . Family history of breast cancer   . Family history of colonic polyps   . Family history of pancreatic cancer   . Family history of prostate cancer   . GERD (gastroesophageal reflux disease)     Patient Active Problem List   Diagnosis Date Noted  . Genetic testing 12/22/2018  . Goals of care, counseling/discussion 12/10/2018  . Family history of pancreatic cancer   . Family history of breast cancer   . Family history of colonic polyps   . Family history of prostate cancer   . Sepsis ([place]) 11/19/2018  .  Acute bronchitis 11/19/2018  . Hypokalemia 11/19/2018  . Abnormal LFTs 11/19/2018  . Suspected Covid-19 Virus Infection   . Port-A-Cath in place 07/17/2018  . Adenocarcinoma of pancreas ([place]) 06/27/2018  . Pancreatic mass   . Tobacco use   . Direct hyperbilirubinemia   . Jaundice 06/07/2018    Past Surgical History:  Procedure Laterality Date  . BILIARY STENT PLACEMENT  06/10/2018   Procedure: BILIARY STENT PLACEMENT;  Surgeon: Ronnette Juniper, MD;  Location: [facility] ENDOSCOPY;  Service: Gastroenterology;;  . CHOLECYSTECTOMY N/A 11/10/2018   Procedure: Laparoscopic Cholecystectomy;  Surgeon: Stark Klein, MD;  Location: [place] [facility];  Service: General;  Laterality: N/A;  . DIAGNOSTIC LAPAROSCOPIC LIVER BIOPSY N/A 11/10/2018   Procedure: Diagnostic Laparoscopic Liver Biopsy;  Surgeon: Stark Klein, MD;  Location: [place];  Service: General;  Laterality: N/A;  . ERCP N/A 06/10/2018   Procedure: ENDOSCOPIC RETROGRADE CHOLANGIOPANCREATOGRAPHY (ERCP);  Surgeon: Ronnette Juniper, MD;  Location: [place];  Service: Gastroenterology;  Laterality: N/A;  . ESOPHAGOGASTRODUODENOSCOPY (EGD) WITH PROPOFOL N/A 06/10/2018   Procedure: ESOPHAGOGASTRODUODENOSCOPY (EGD) WITH PROPOFOL;  Surgeon: Ronnette Juniper, MD;  Location: [place];  Service: Gastroenterology;  Laterality: N/A;  . FINE NEEDLE ASPIRATION  06/10/2018   Procedure: FINE NEEDLE ASPIRATION (FNA) LINEAR;  Surgeon: Ronnette Juniper, MD;  Location: [place];  Service: Gastroenterology;;  . IR RADIOLOGIST EVAL & MGMT  12/10/2018  .  PORT-A-CATH REMOVAL N/A 11/10/2018   Procedure: REMOVAL PORT-A-CATH;  Surgeon: Stark Klein, MD;  Location: [place];  Service: General;  Laterality: N/A;  . PORTACATH PLACEMENT N/A 07/02/2018   Procedure: INSERTION PORT-A-CATH;  Surgeon: Stark Klein, MD;  Location: [place];  Service: General;  Laterality: N/A;  . SPHINCTEROTOMY  06/10/2018   Procedure: SPHINCTEROTOMY;  Surgeon: Ronnette Juniper, MD;  Location: [facility] ENDOSCOPY;  Service:  Gastroenterology;;  . UPPER ESOPHAGEAL ENDOSCOPIC ULTRASOUND (EUS) N/A 06/10/2018   Procedure: UPPER ESOPHAGEAL ENDOSCOPIC ULTRASOUND (EUS);  Surgeon: Ronnette Juniper, MD;  Location: [place];  Service: Gastroenterology;  Laterality: N/A;  . VASECTOMY          Home Medications    Prior to Admission medications   Medication Sig Start Date End Date Taking? Authorizing Provider  docusate sodium (COLACE) 100 MG capsule Take 200 mg by mouth daily as needed for mild constipation.   Yes [provider]  esomeprazole (NEXIUM) 20 MG capsule Take 20 mg by mouth daily after breakfast.    Yes [provider]  folic acid (FOLVITE) 1 MG tablet Take 1 tablet (1 mg total) by mouth daily after breakfast. 12/08/18  Yes [name], MD  polyethylene glycol (MIRALAX / GLYCOLAX) 17 g packet Take 17 g by mouth 2 (two) times daily.   Yes [provider]  prochlorperazine (COMPAZINE) 10 MG tablet Take 1 tablet (10 mg total) by mouth every 6 (six) hours as needed (NAUSEA). 12/22/18  Yes Alla Feeling, NP  rivaroxaban (XARELTO) 20 MG TABS tablet Take 1 tablet (20 mg total) by mouth daily with supper. Patient taking differently: Take 20 mg by mouth daily after breakfast.  10/02/18  Yes [name], MD  simethicone (MYLICON) 017 MG chewable tablet Chew 250 mg by mouth every 6 (six) hours as needed for flatulence.    Yes [provider]  diphenoxylate-atropine (LOMOTIL) 2.5-0.025 MG tablet Take 2 tablets by mouth 4 (four) times daily as needed for diarrhea or loose stools. Patient not taking: Reported on 12/26/2018 06/27/18   Alla Feeling, NP  magic mouthwash w/lidocaine SOLN Take 5 mLs by mouth 4 (four) times daily as needed for mouth pain. Patient not taking: Reported on 12/26/2018 08/22/18   Sandi Mealy E., PA-C  ondansetron (ZOFRAN) 8 MG tablet Take 1 tablet (8 mg total) by mouth every 8 (eight) hours as needed for nausea or vomiting. Patient not taking: Reported on 12/26/2018 12/22/18    Alla Feeling, NP  Syringe, Disposable, (10-12CC SYRINGE) 12 ML MISC 10 mLs by Does not apply route as needed. 12/04/18   Alla Feeling, NP    Family History Family History  Problem Relation Age of Onset  . Stroke Mother   . Hypertension Mother   . CAD Father        CABG  . Pancreatic cancer Paternal Grandfather   . Breast cancer Maternal Aunt   . Lymphoma Paternal Uncle   . Other Maternal Uncle        [name]  . Stroke Maternal Grandmother   . Prostate cancer Cousin        pat first cousin, dx between 4-60    Social History Social History   Tobacco Use  . Smoking status: Current Every Day Smoker    Packs/day: 0.25    Years: 30.00    Pack years: 7.50  . Smokeless tobacco: Never Used  . Tobacco comment: cut back considerably  Substance Use Topics  . Alcohol use: Yes  Comment: 6-7 beers Q week  . Drug use: Never     Allergies   Contrast media [iodinated diagnostic agents] and Iohexol   Review of Systems Review of Systems  Constitutional: Negative.  Negative for appetite change.  Respiratory: Negative for shortness of breath.   Cardiovascular: Negative for chest pain.  Gastrointestinal: Positive for abdominal pain and constipation. Negative for anal bleeding, blood in stool, nausea and vomiting.  Endocrine: Negative for polyuria.  Genitourinary: Positive for dysuria. Negative for flank pain, penile pain and testicular pain.  Musculoskeletal: Negative for back pain.  Allergic/Immunologic: Positive for immunocompromised state.  Neurological: Negative for dizziness and light-headedness.  Hematological: Does not bruise/bleed easily.     Physical Exam Updated Vital Signs BP 132/90   Pulse 86   Temp 97.8 F (36.6 C) (Oral)   Resp 18   SpO2 98%   Physical Exam Vitals signs and nursing note reviewed. Exam conducted with a chaperone present.  Constitutional:      General: He is not in acute distress.    Appearance: He is well-developed and normal  weight. He is not ill-appearing, toxic-appearing or diaphoretic.  HENT:     Head: Normocephalic and atraumatic.     Mouth/Throat:     Mouth: Mucous membranes are moist.     Pharynx: Oropharynx is clear. No pharyngeal swelling or oropharyngeal exudate.  Eyes:     General: No scleral icterus.    Extraocular Movements: Extraocular movements intact.  Cardiovascular:     Rate and Rhythm: Normal rate and regular rhythm.     Heart sounds: Normal heart sounds.  Pulmonary:     Effort: Pulmonary effort is normal. No respiratory distress.     Breath sounds: Normal breath sounds.  Abdominal:     General: Abdomen is flat. There is no distension.     Palpations: Abdomen is soft.     Tenderness: There is abdominal tenderness in the left upper quadrant. There is no right CVA tenderness, left CVA tenderness, guarding or rebound. Negative signs include Murphy's sign.  Skin:    General: Skin is warm.  Neurological:     Mental Status: He is alert.  Psychiatric:        Mood and Affect: Mood normal.      ED Treatments / Results  Labs (all labs ordered are listed, but only abnormal results are displayed) Labs Reviewed  COMPREHENSIVE METABOLIC PANEL - Abnormal; Notable for the following components:      Result Value   Sodium 134 (*)    Glucose, Bld 120 (*)    Calcium 8.7 (*)    AST 60 (*)    ALT 67 (*)    All other components within normal limits  CBC WITH DIFFERENTIAL/PLATELET - Abnormal; Notable for the following components:   RBC 3.81 (*)    Hemoglobin 11.3 (*)    HCT 35.2 (*)    Lymphs Abs 0.5 (*)    Monocytes Absolute 0.0 (*)    All other components within normal limits  URINALYSIS, ROUTINE W REFLEX MICROSCOPIC - Abnormal; Notable for the following components:   Color, Urine STRAW (*)    Specific Gravity, Urine 1.003 (*)    All other components within normal limits  URINE CULTURE  LIPASE, BLOOD    EKG None  Radiology No results found.  Procedures Procedures (including  critical care time)  Medications Ordered in ED Medications - No data to display   Initial Impression / Assessment and Plan / ED Course  I  have reviewed the triage vital signs and the nursing notes.  Pertinent labs & imaging results that were available during my care of the patient were reviewed by me and considered in my medical decision making (see chart for details).  Clinical Course as of Jan 02 1727  Fri Dec 26, 2018  1455 Patient with pancreatic cancer and constipation sent by oncologist to r/o bowel obstruction due to abd pain. Patient declined pain meds at this time and is comfortable. CBC is baseline. CMP significant for AST 60 and ALT 67. Sodium 134. This likely reflects his pancreatic cancer.     [KM]    Clinical Course User Index [KM] Alveria Apley, PA-C       Based on review of vitals, medical screening exam, lab work and/or imaging, there does not appear to be an acute, emergent etiology for the patient's symptoms. Counseled pt on good return precautions and encouraged both PCP and ED follow-up as needed.  Prior to discharge, I also discussed incidental imaging findings with patient in detail and advised appropriate, recommended follow-up in detail.  Clinical Impression: 1. Generalized abdominal pain     Disposition: Discharge  Prior to providing a prescription for a controlled substance, I independently reviewed the patient's recent prescription history on the [place]. The patient had no recent or regular prescriptions and was deemed appropriate for a brief, less than 3 day prescription of narcotic for acute analgesia.  This note was prepared with assistance of Systems analyst. Occasional wrong-word or sound-a-like substitutions may have occurred due to the inherent limitations of voice recognition software.   Final Clinical Impressions(s) / ED Diagnoses   Final diagnoses:  Generalized abdominal  pain    ED Discharge Orders    None       Kristine Royal 01/02/19 1728    Valarie Merino, MD 01/06/19 [id number]

## 2018-12-26 NOTE — ED Notes (Signed)
Patient reports diffuse abdominal pain; sent by MD to r/o GI obstruction

## 2018-12-26 NOTE — Discharge Instructions (Addendum)
Follow-up with Dr. Burr Medico as directed.  Continue using the stool softeners as directed.  Return to the emergency department if you have any worsening symptoms.

## 2018-12-26 NOTE — ED Notes (Signed)
Patient transported to CT 

## 2018-12-26 NOTE — Telephone Encounter (Signed)
Patient's wife [name] calls with concern over increased abdominal pain and constipation, the has tried prune juice, miralax multiple times, either passing nothing but water or tiny little amounts, pain has him doubled over.  I spoke with Dr. Burr Medico she is recommended he come to [facility] ED asap to be evaluated for bowel obstruction.  She verbalized an understanding and they will proceed to [facility] ED.

## 2018-12-26 NOTE — ED Notes (Signed)
ED Provider at bedside. 

## 2018-12-26 NOTE — ED Notes (Signed)
Oncology left patient room

## 2018-12-26 NOTE — ED Notes (Signed)
Bed: WA17 Expected date:  Expected time:  Means of arrival:  Comments: Do not use per charge

## 2018-12-26 NOTE — Progress Notes (Signed)
[place]   [phone] [phone]   Clinic Follow up Note   Patient Care Team: Street, [name], MD as PCP - General (Family Medicine)  Date of Service:  12/30/2018  CHIEF COMPLAINT: F/u pancreatic cancer  SUMMARY OF ONCOLOGIC HISTORY: Oncology History   Cancer Staging Adenocarcinoma of pancreas [facility]) Staging form: Exocrine Pancreas, AJCC 8th Edition - Clinical stage from 06/10/2018: Stage IB (cT2, cN0, cM0) - Signed by [name], MD on 06/29/2018       Adenocarcinoma of pancreas ([place])   06/07/2018 Imaging    CT AP w Contrast IMPRESSION: 1. There is a low attenuation mass centered around the neck of pancreas which is concerning for neoplasm. Pancreatic adenocarcinoma favored. This results in common bile duct obstruction with mild intrahepatic biliary ductal dilatation. There also is involvement of the portal venous confluence. Further evaluation with nonemergent contrast enhanced MRI of the pancreas is recommended. 2. Small indeterminate low-attenuation structure is noted within segment 4 a of the liver. This could be better addressed at MRI.    06/09/2018 Imaging    MR ABD MRCP  The pancreatic mass involves approximately 40 percent of the main portal vein circumference at the portal splenic venous confluence, with associated mild narrowing of the portal splenic venous confluence. The SMV, splenic vein and main, right and left portal veins remain patent. The celiac trunk and SMA are not involved by the pancreatic mass.  IMPRESSION: 2.3 cm diameter hypoenhancing mass at the junction of the head and neck of pancreas with pancreatic ductal dilatation, likely adenocarcinoma.  Intra and extrahepatic bile duct dilatation with abrupt change in caliber at the mid common bile duct. This could indicate an obstructing mass or stricture.     06/10/2018 Initial Biopsy    Diagnosis PANCREAS, FINE NEEDLE ASPIRATION (SPECIMEN 1 OF 1 COLLECTED  06/10/18): MALIGNANT CELLS CONSISTENT WITH ADENOCARCINOMA.    06/10/2018 Procedure    IMPRESSION: 1. 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 2. Placement of a metallic biliary stent.    06/10/2018 Procedure    EUS per Dr. Paulita Fujita Impression:  - There was dilation in the common bile duct which measured up to 12 mm. - A mass was identified in the pancreatic head. This was staged T3 N1 Mx by endosonographic criteria. Fine needle aspiration performed. - A few lymph nodes were visualized and measured in the peripancreatic region. - There was no evidence of significant pathology in the left lobe of the liver.    06/10/2018 Cancer Staging    Staging form: Exocrine Pancreas, AJCC 8th Edition - Clinical stage from 06/10/2018: Stage IB (cT2, cN0, cM0) - Signed by [name], MD on 06/29/2018    06/27/2018 Initial Diagnosis    Adenocarcinoma of pancreas ([place])    07/02/2018 Imaging    IMPRESSION: 1. Multiple small pulmonary nodules scattered throughout the lungs measuring up to 7 mm in size. These are nonspecific, but the possibility of metastatic disease should be considered, and close attention at time of routine followups is recommended. 2. In addition, today's study demonstrates new and enlarging low-attenuation lesions in the liver. This is poorly evaluated on today's noncontrast CT examination, but is concerning for potential metastatic disease. Further evaluation with repeat nonemergent MRI of the abdomen with and without IV gadolinium is suggested in the near future to better evaluate these findings. 3. Aortic atherosclerosis, in addition to left main and 2 vessel coronary artery disease. Please note  that although the presence of coronary artery calcium documents the presence of coronary artery disease, the severity of this disease and any potential stenosis cannot be assessed on this non-gated CT examination. Assessment for potential  risk factor modification, dietary therapy or pharmacologic therapy may be warranted, if clinically indicated. 4. Mild aneurysmal dilatation of the ascending thoracic aorta (4.8 cm in diameter). Ascending thoracic aortic aneurysm. Recommend semi-annual imaging followup by CTA or MRA and referral to cardiothoracic surgery if not already obtained. This recommendation follows 2010 ACCF/AHA/AATS/ACR/ASA/SCA/SCAI/SIR/STS/SVM Guidelines for the Diagnosis and Management of Patients With Thoracic Aortic Disease. Circulation. 2010; 121: X528-U132.  Aortic Atherosclerosis (ICD10-I70.0). Aortic aneurysm NOS (ICD10-I71.9).    07/04/2018 - 10/16/2018 Chemotherapy     FOLFIRINOX q2 weeks    07/15/2018 Imaging    07/15/2018 Liver US IMPRESSION: No liver lesions identified with ultrasound. Ultrasound-guided biopsy was not performed. Recommend further characterization for liver lesions with a repeat MRI, with and without contrast.    07/24/2018 Imaging    07/24/2018 MRI Abdomen IMPRESSION: 1. The hypoenhancing mass at the junction of the pancreatic body and head has reduced in size, previously 3.2 by 2.8 cm and currently 2.9 by 2.2 cm. A small peripancreatic lymph node adjacent to the mass was previously 0.9 cm in short axis and is currently 0.7 cm in short axis. The amount of contact between the pancreatic mass and the confluence of the splenic vein and SMV is similar to the prior exam. 2. There is a 6 mm probable hemangioma in segment 4a of the liver, based on the delayed enhancement pattern. This is somewhat ill-defined. The lesion appeared larger on the prior noncontrast CT but presumably may have been overestimated on that exam. There is also some hypodensity along the dome of the right hepatic lobe which appears to most likely be due to a slip of the diaphragm rather than a discernible lesion on MRI. 3. Aortic Atherosclerosis (ICD10-I70.0). Notably, there is a small amount of mural  thrombus along the right side of the abdominal aorta below the right renal artery level which has not been seen previously.    09/03/2018 Imaging    09/03/2018 MRI Abdomen IMPRESSION: 1. Stable mass (adenocarcinoma) in the neck of the pancreas with upstream duct dilatation. 2. No evidence of lymphadenopathy in the porta hepatis or peripancreatic fat. 3. No evidence hepatic metastasis. 4. Mild biliary duct dilatation LEFT hepatic lobe similar to comparison exam. Biliary stent within the common bile duct.    09/16/2018 Imaging    MR MRA CHEST W WO CONTRAST  IMPRESSION: VASCULAR  1. 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 2. Left subclavian port catheter to the SVC. 3. SVC is patent      10/24/2018 Imaging    MRI abdomen  IMPRESSION: 1. Slight interval decrease in size of the pancreatic mass. 2. Stable to slightly smaller adjacent lymph nodes. 3. No findings for metastatic disease involving the liver or lung bases. 4. Common bile duct stent in good position without complicating features.    11/10/2018 Surgery    PAC removal, Laparoscopic Cholecystectomy and Diagnostic Laparoscopic Liver Biopsy by Dre. Byerly  and Dr. Lucia Gaskins  11/10/18     11/10/2018 Pathology Results    Diagnosis 11/10/18 1. Liver, biopsy, Left - ADENOCARCINOMA. - SEE COMMENT. 2. Gallbladder - CHRONIC CHOLECYSTITIS WITH CHOLELITHIASIS. - ONE MORPHOLOGICALLY BENIGN LYMPH NODE.    12/10/2018 Imaging    CT AP  IMPRESSION: 1. Interval resolution of gallbladder fossa  abscess. The percutaneous drainage catheter remains in good position. 2. Patent metallic biliary stent with expected pneumobilia. 3. Decreasing size of low-attenuation in hepatic segment 5 adjacent to the gallbladder fossa likely representing a small resolving intrahepatic abscess. 4. Additional ancillary findings as above without significant interval change.    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  The CustomNext-Expanded gene panel offered by [facility] and includes sequencing and rearrangement analysis for the following 81 genes: AIP, ALK, APC*, ATM*, AXIN2, BAP1, BARD1, BLM, BMPR1A, BRCA1*, BRCA2*, BRIP1*, CDC73, CDH1*, CDK4, CDKN1B, CDKN2A, CHEK2*, CTNNA1, DICER1, FANCC, FH, FLCN, GALNT12, HOXB13, KIT, MAX, MEN1, MET, MLH1*, MRE11A, MSH2*, MSH6*, MUTYH*, NBN, NF1*, NF2, NTHL1, PALB2*, PDGFRA, PHOX2B, PMS2*, POLD1, POLE, POT1, PRKAR1A, PTCH1, PTEN*, RAD50, RAD51C*, RAD51D*, RB1, RET, SDHA, SDHAF2, SDHB, SDHC, SDHD, SMAD4, SMARCA4, SMARCB1, SMARCE1, STK11, SUFU, TMEM127, TP53*, TSC1, TSC2, VHL and XRCC2 (sequencing and deletion/duplication); CASR, CFTR, CPA1, CTRC, EGFR, MITF, PRSS1 and SPINK1 (sequencing only); EPCAM and GREM1 (deletion/duplication only). DNA and RNA analyses performed for * genes. The report date is Dec 19, 2018.    12/22/2018 -  Chemotherapy    second-line chemo with Gemcitabine and Abraxane 2-3 weeks on/1 week off starting 12/22/18 which single agent gemcitabine for cycle 1. Added Abraxane with cycle C1D8.      12/26/2018 Imaging    CT AP IMPRESSION: 1. No evidence of bowel obstruction. 2. New small amount of ascites with increased nodular soft tissue stranding throughout the omental fat. This could be inflammatory and indicate peritonitis. Early peritoneal carcinomatosis would be another explanation. 3. Patent biliary stent and persistent pneumobilia. No residual fluid collection identified in the cholecystectomy bed following percutaneous drain removal.      CURRENT THERAPY:  second-line chemo with Gemcitabine and Abraxane 2-3 weeks on/1 week off starting 12/22/18 with single agent Gemcitabine for first cycle. Added Abraxane with C1D8 on 12/30/18.   INTERVAL HISTORY:  Ernest Wu is here for a follow up and treatment. He is here alone. I called his wife to be included in the  visit today. Since discharged from ED he has 2-3 BM since then. He is on Miralax since, not today. He is on Tylenol currently. He also has oxycodone as needed. His wife notes she is concerned with his quality of life from chemo but is open to him continuing treatment. He agrees with her.     REVIEW OF SYSTEMS:   Constitutional: Denies fevers, chills or abnormal weight loss Eyes: Denies blurriness of vision Ears, nose, mouth, throat, and face: Denies mucositis or sore throat Respiratory: Denies cough, dyspnea or wheezes Cardiovascular: Denies palpitation, chest discomfort or lower extremity swelling Gastrointestinal:  Denies nausea, heartburn or change in bowel habits (+) Improved abominable pain  Skin: Denies abnormal skin rashes Lymphatics: Denies new lymphadenopathy or easy bruising Neurological:Denies numbness, tingling or new weaknesses Behavioral/Psych: Mood is stable, no new changes  All other systems were reviewed with the patient and are negative.  MEDICAL HISTORY:  Past Medical History:  Diagnosis Date  . Anxiety   . Cancer ([place]) 06/2018   Pancreatic  . Family history of breast cancer   . Family history of colonic polyps   . Family history of pancreatic cancer   . Family history of prostate cancer   . GERD (gastroesophageal reflux disease)     SURGICAL HISTORY: Past Surgical History:  Procedure Laterality Date  . BILIARY STENT PLACEMENT  06/10/2018   Procedure: BILIARY STENT PLACEMENT;  Surgeon: Ronnette Juniper, MD;  Location: [facility] ENDOSCOPY;  Service: Gastroenterology;;  . CHOLECYSTECTOMY N/A 11/10/2018   Procedure: Laparoscopic Cholecystectomy;  Surgeon: Stark Klein, MD;  Location: [place];  Service: General;  Laterality: N/A;  . DIAGNOSTIC LAPAROSCOPIC LIVER BIOPSY N/A 11/10/2018   Procedure: Diagnostic Laparoscopic Liver Biopsy;  Surgeon: Stark Klein, MD;  Location: [place];  Service: General;  Laterality: N/A;  . ERCP N/A 06/10/2018   Procedure: ENDOSCOPIC RETROGRADE  CHOLANGIOPANCREATOGRAPHY (ERCP);  Surgeon: Ronnette Juniper, MD;  Location: [place];  Service: Gastroenterology;  Laterality: N/A;  . ESOPHAGOGASTRODUODENOSCOPY (EGD) WITH PROPOFOL N/A 06/10/2018   Procedure: ESOPHAGOGASTRODUODENOSCOPY (EGD) WITH PROPOFOL;  Surgeon: Ronnette Juniper, MD;  Location: [place];  Service: Gastroenterology;  Laterality: N/A;  . FINE NEEDLE ASPIRATION  06/10/2018   Procedure: FINE NEEDLE ASPIRATION (FNA) LINEAR;  Surgeon: Ronnette Juniper, MD;  Location: [place];  Service: Gastroenterology;;  . IR RADIOLOGIST EVAL & MGMT  12/10/2018  . PORT-A-CATH REMOVAL N/A 11/10/2018   Procedure: REMOVAL PORT-A-CATH;  Surgeon: Stark Klein, MD;  Location: [place];  Service: General;  Laterality: N/A;  . PORTACATH PLACEMENT N/A 07/02/2018   Procedure: INSERTION PORT-A-CATH;  Surgeon: Stark Klein, MD;  Location: [place];  Service: General;  Laterality: N/A;  . SPHINCTEROTOMY  06/10/2018   Procedure: SPHINCTEROTOMY;  Surgeon: Ronnette Juniper, MD;  Location: [facility] ENDOSCOPY;  Service: Gastroenterology;;  . UPPER ESOPHAGEAL ENDOSCOPIC ULTRASOUND (EUS) N/A 06/10/2018   Procedure: UPPER ESOPHAGEAL ENDOSCOPIC ULTRASOUND (EUS);  Surgeon: Ronnette Juniper, MD;  Location: [place];  Service: Gastroenterology;  Laterality: N/A;  . VASECTOMY      I have reviewed the social history and family history with the patient and they are unchanged from previous note.  ALLERGIES:  is allergic to contrast media [iodinated diagnostic agents] and iohexol.  MEDICATIONS:  Current Outpatient Medications  Medication Sig Dispense Refill  . diphenoxylate-atropine (LOMOTIL) 2.5-0.025 MG tablet Take 2 tablets by mouth 4 (four) times daily as needed for diarrhea or loose stools. (Patient not taking: Reported on 12/26/2018) 40 tablet 0  . docusate sodium (COLACE) 100 MG capsule Take 200 mg by mouth daily as needed for mild constipation.    Marland Kitchen esomeprazole (NEXIUM) 20 MG capsule Take 20 mg by mouth daily after breakfast.     .  folic acid (FOLVITE) 1 MG tablet Take 1 tablet (1 mg total) by mouth daily after breakfast. 30 tablet 3  . magic mouthwash w/lidocaine SOLN Take 5 mLs by mouth 4 (four) times daily as needed for mouth pain. (Patient not taking: Reported on 12/26/2018) 240 mL 2  . ondansetron (ZOFRAN) 8 MG tablet Take 1 tablet (8 mg total) by mouth every 8 (eight) hours as needed for nausea or vomiting. (Patient not taking: Reported on 12/26/2018) 30 tablet 1  . polyethylene glycol (MIRALAX / GLYCOLAX) 17 g packet Take 17 g by mouth 2 (two) times daily.    . prochlorperazine (COMPAZINE) 10 MG tablet Take 1 tablet (10 mg total) by mouth every 6 (six) hours as needed (NAUSEA). 30 tablet 1  . rivaroxaban (XARELTO) 20 MG TABS tablet Take 1 tablet (20 mg total) by mouth daily with supper. (Patient taking differently: Take 20 mg by mouth daily after breakfast. ) 30 tablet 2  . simethicone (MYLICON) 786 MG chewable tablet Chew 250 mg by mouth every 6 (six) hours as needed for flatulence.     . Syringe, Disposable, (10-12CC SYRINGE) 12 ML MISC 10 mLs by Does not apply route as needed. 1 each 1  No current facility-administered medications for this visit.    Facility-Administered Medications Ordered in Other Visits  Medication Dose Route Frequency Provider Last Rate Last Dose  . 0.9 %  sodium chloride infusion   Intravenous Once [name], MD        PHYSICAL EXAMINATION: ECOG PERFORMANCE STATUS: 2 - Symptomatic, <50% confined to bed  Vitals:   12/30/18 1405  BP: (!) 140/91  Pulse: 71  Resp: 18  Temp: 98.7 F (37.1 C)  SpO2: 100%   Filed Weights   12/30/18 1405  Weight: 186 lb 8 oz (84.6 kg)    GENERAL:alert, no distress and comfortable SKIN: skin color, texture, turgor are normal, no rashes or significant lesions EYES: normal, Conjunctiva are pink and non-injected, sclera clear  NECK: supple, thyroid normal size, non-tender, without nodularity LYMPH:  no palpable lymphadenopathy in the cervical, axillary  LUNGS: clear to auscultation and percussion with normal breathing effort HEART: regular rate & rhythm and no murmurs and no lower extremity edema ABDOMEN:abdomen soft, non-tender and normal bowel sounds Musculoskeletal:no cyanosis of digits and no clubbing  NEURO: alert & oriented x 3 with fluent speech, no focal motor/sensory deficits  LABORATORY DATA:  I have reviewed the data as listed CBC Latest Ref Rng & Units 12/30/2018 12/26/2018 12/22/2018  WBC 4.0 - 10.5 K/uL 5.4 4.3 5.7  Hemoglobin 13.0 - 17.0 g/dL 11.7(L) 11.3(L) 12.5(L)  Hematocrit 39.0 - 52.0 % 35.9(L) 35.2(L) 38.4(L)  Platelets 150 - 400 K/uL 121(L) 171 164     CMP Latest Ref Rng & Units 12/30/2018 12/26/2018 12/22/2018  Glucose 70 - 99 mg/dL 103(H) 120(H) 147(H)  BUN 6 - 20 mg/dL _0 Creatinine 0.61 - 1.24 mg/dL 0.72 0.62 0.79  Sodium 135 - 145 mmol/L 138 134(L) 139  Potassium 3.5 - 5.1 mmol/L 3.9 3.8 3.9  Chloride 98 - 111 mmol/L 104 99 104  CO2 22 - 32 mmol/L _1 Calcium 8.9 - 10.3 mg/dL 9.1 8.7(L) 8.9  Total Protein 6.5 - 8.1 g/dL 6.8 7.5 7.0  Total Bilirubin 0.3 - 1.2 mg/dL 0.3 0.5 0.3  Alkaline Phos 38 - 126 U/L 107 101 107  AST 15 - 41 U/L 32 60(H) 20  ALT 0 - 44 U/L 90(H) 67(H) 28      RADIOGRAPHIC STUDIES: I have personally reviewed the radiological images as listed and agreed with the findings in the report. No results found.   ASSESSMENT & PLAN:  JAEL KOSTICK is a 59 y.o. male with   1. Adenocarcinoma of the pancreas,cT3N1M0, borderline resectable, liver metastasis in 11/2018 -Diagnosed in 06/2018. Treated with neoadjuvant chemo FOLFIRINOX.  -He underwent Cholecystectomy and liver biopsy on 11/10/18 with Dr. Barry Dienes and Whipple surgery was aborted due to new liver mets found during surgery. Given metastasis disease his cancer is no longer curable but still treatable.  -Unfortunately after his surgery he developed an abscess at surgical site which caused sepsis secondary to GNR bacteremia. He  was treated with antibiotics and temporary JP drain of abscess. He has recovered well.  -He started second-line chemo with Gemcitabine and Abraxane on 12/22/18. He started cycle 1 with single agent gemcitabine given reluctance to continue cancer treatment. -We discussed his CT AP from 12/26/18 which was done during his recent ED visit. It shows new small ascites and increased nodular soft tissue in omentum which is suspicious of peritoneal metastasis. Will obtain a CT chest for full restaging. He is agreeable.  -I again reviewed the incurable nature  of his metastatic disease, and the goal of therapy is palliative to prolong his life and preserve his quality of life   -I discussed if his cancer continues to grow outside bowl it can lead to bowel obstruction along with pain.Will monitor his sypmtoms.   -I recommend he start on chemo with both Gemcitabine and Abraxane to better control his cancer to prolong his life and improve quality of life. Pt and his wife are very concerned about potential AEs from chemo. Given his previously good tolerance to FOLFIRINOX, and relatively young age, I anticipate he will tolerate this regimen. I answered all their questions  -I plan to continue treatment for as long as it's effective and he can tolerate. Will give chemo break as needed.  -Labs reviewed, CBC and CMP WNL, Hg 11.7, PLT 121K, BG 103, ALT 90. CA 19.9 still pending. Overall adequate to proceed with Gemcitabine and Abraxane with dose reduction today.  -I again encouraged him to watch for significant side effects and contact clinic if needed.  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  -Heis onXarelto20 mg daily now, continue indefinitely   4. Constipation -Secondary to chemo and premedications. -He takes Miralax and Colase as needed -Recently worsened with pain. Since ED visit on 12/26/18, his pain is improving and on daily miralax with BM every 1-2 days. -I instructed him to watch use of oxycodone as this can lead to constipation.   5. Sepsis secondary to Hafnia bacteriemia, s/p JP drain placement  -treated with antibiotics and draining of abdominal abscess.  -drain removed per IR on 12/10/18, drain site has healed well, repeated CT on 5/22 showed no re residual fluids   6. Goal of care discussion  -We again discussed the incurable nature of his cancer, and the overall poor prognosis, especially if he does not have good response to chemotherapy or progress on chemo -The patient understands the goal of care is palliative. -he is full code now    Plan -Labs reviewed and adequate to proceed with C1D8 chemo today with gemcitabine and Abraxane, will reduce both dose due to pt's concerns of side effects -he will return next week for C1D15 chemo   -CT chest asap, my nurse called radiology to schedule  -pt's wife was on the phone during his entire office visit    No problem-specific Assessment & Plan notes found for this encounter.   No orders of the defined types were placed in this encounter.  All questions were answered. The patient knows to call the clinic with any problems, questions or concerns. No barriers to learning was detected. I spent 25 minutes counseling the patient face to face. The total time spent in the appointment was 30 minutes and more than 50% was on counseling and review of test results     [name], MD 12/30/2018   I, Joslyn Devon, am acting as scribe for [name], MD.   I have reviewed the  above documentation for accuracy and completeness, and I agree with the above.

## 2018-12-27 LAB — URINE CULTURE: Culture: NO GROWTH

## 2018-12-30 ENCOUNTER — Other Ambulatory Visit: Payer: Self-pay

## 2018-12-30 ENCOUNTER — Inpatient Hospital Stay (HOSPITAL_BASED_OUTPATIENT_CLINIC_OR_DEPARTMENT_OTHER): Payer: Managed Care, Other (non HMO) | Admitting: Hematology

## 2018-12-30 ENCOUNTER — Inpatient Hospital Stay: Payer: Managed Care, Other (non HMO)

## 2018-12-30 ENCOUNTER — Encounter: Payer: Self-pay | Admitting: Hematology

## 2018-12-30 VITALS — BP 140/91 | HR 71 | Temp 98.7°F | Resp 18 | Ht 72.0 in | Wt 186.5 lb

## 2018-12-30 DIAGNOSIS — Z79899 Other long term (current) drug therapy: Secondary | ICD-10-CM

## 2018-12-30 DIAGNOSIS — C787 Secondary malignant neoplasm of liver and intrahepatic bile duct: Secondary | ICD-10-CM | POA: Diagnosis not present

## 2018-12-30 DIAGNOSIS — Z800 Family history of malignant neoplasm of digestive organs: Secondary | ICD-10-CM

## 2018-12-30 DIAGNOSIS — I700 Atherosclerosis of aorta: Secondary | ICD-10-CM

## 2018-12-30 DIAGNOSIS — C250 Malignant neoplasm of head of pancreas: Secondary | ICD-10-CM

## 2018-12-30 DIAGNOSIS — C259 Malignant neoplasm of pancreas, unspecified: Secondary | ICD-10-CM

## 2018-12-30 DIAGNOSIS — Z86718 Personal history of other venous thrombosis and embolism: Secondary | ICD-10-CM

## 2018-12-30 DIAGNOSIS — F419 Anxiety disorder, unspecified: Secondary | ICD-10-CM

## 2018-12-30 DIAGNOSIS — K5900 Constipation, unspecified: Secondary | ICD-10-CM | POA: Diagnosis not present

## 2018-12-30 DIAGNOSIS — R918 Other nonspecific abnormal finding of lung field: Secondary | ICD-10-CM

## 2018-12-30 DIAGNOSIS — Z7901 Long term (current) use of anticoagulants: Secondary | ICD-10-CM

## 2018-12-30 DIAGNOSIS — F17210 Nicotine dependence, cigarettes, uncomplicated: Secondary | ICD-10-CM

## 2018-12-30 DIAGNOSIS — Z9049 Acquired absence of other specified parts of digestive tract: Secondary | ICD-10-CM

## 2018-12-30 DIAGNOSIS — I2510 Atherosclerotic heart disease of native coronary artery without angina pectoris: Secondary | ICD-10-CM

## 2018-12-30 DIAGNOSIS — Z803 Family history of malignant neoplasm of breast: Secondary | ICD-10-CM

## 2018-12-30 LAB — CBC WITH DIFFERENTIAL (CANCER CENTER ONLY)
Abs Immature Granulocytes: 0.02 10*3/uL (ref 0.00–0.07)
Basophils Absolute: 0 10*3/uL (ref 0.0–0.1)
Basophils Relative: 0 %
Eosinophils Absolute: 0.1 10*3/uL (ref 0.0–0.5)
Eosinophils Relative: 2 %
HCT: 35.9 % — ABNORMAL LOW (ref 39.0–52.0)
Hemoglobin: 11.7 g/dL — ABNORMAL LOW (ref 13.0–17.0)
Immature Granulocytes: 0 %
Lymphocytes Relative: 21 %
Lymphs Abs: 1.1 10*3/uL (ref 0.7–4.0)
MCH: 29.9 pg (ref 26.0–34.0)
MCHC: 32.6 g/dL (ref 30.0–36.0)
MCV: 91.8 fL (ref 80.0–100.0)
Monocytes Absolute: 0.4 10*3/uL (ref 0.1–1.0)
Monocytes Relative: 7 %
Neutro Abs: 3.7 10*3/uL (ref 1.7–7.7)
Neutrophils Relative %: 70 %
Platelet Count: 121 10*3/uL — ABNORMAL LOW (ref 150–400)
RBC: 3.91 MIL/uL — ABNORMAL LOW (ref 4.22–5.81)
RDW: 13.8 % (ref 11.5–15.5)
WBC Count: 5.4 10*3/uL (ref 4.0–10.5)
nRBC: 0 % (ref 0.0–0.2)

## 2018-12-30 LAB — CMP (CANCER CENTER ONLY)
ALT: 90 U/L — ABNORMAL HIGH (ref 0–44)
AST: 32 U/L (ref 15–41)
Albumin: 3.4 g/dL — ABNORMAL LOW (ref 3.5–5.0)
Alkaline Phosphatase: 107 U/L (ref 38–126)
Anion gap: 8 (ref 5–15)
BUN: 6 mg/dL (ref 6–20)
CO2: 26 mmol/L (ref 22–32)
Calcium: 9.1 mg/dL (ref 8.9–10.3)
Chloride: 104 mmol/L (ref 98–111)
Creatinine: 0.72 mg/dL (ref 0.61–1.24)
GFR, Est AFR Am: >60 mL/min (ref >60)
GFR, Estimated: >60 mL/min (ref >60)
Glucose, Bld: 103 mg/dL — ABNORMAL HIGH (ref 70–99)
Potassium: 3.9 mmol/L (ref 3.5–5.1)
Sodium: 138 mmol/L (ref 135–145)
Total Bilirubin: 0.3 mg/dL (ref 0.3–1.2)
Total Protein: 6.8 g/dL (ref 6.5–8.1)

## 2018-12-30 MED ORDER — PACLITAXEL PROTEIN-BOUND CHEMO INJECTION 100 MG
95.0000 mg/m2 | Freq: Once | INTRAVENOUS | Status: AC
  Administered 2018-12-30: 17:00:00 200 mg via INTRAVENOUS
  Filled 2018-12-30: qty 40

## 2018-12-30 MED ORDER — PROCHLORPERAZINE MALEATE 10 MG PO TABS
10.0000 mg | ORAL_TABLET | Freq: Once | ORAL | Status: AC
  Administered 2018-12-30: 16:00:00 10 mg via ORAL

## 2018-12-30 MED ORDER — SODIUM CHLORIDE 0.9 % IV SOLN
Freq: Once | INTRAVENOUS | Status: AC
  Administered 2018-12-30: 16:00:00 via INTRAVENOUS
  Filled 2018-12-30: qty 250

## 2018-12-30 MED ORDER — SODIUM CHLORIDE 0.9 % IV SOLN
Freq: Once | INTRAVENOUS | Status: DC
  Filled 2018-12-30: qty 250

## 2018-12-30 MED ORDER — PACLITAXEL PROTEIN-BOUND CHEMO INJECTION 100 MG: 100.0000 mg/m2 | Freq: Once | Status: DC

## 2018-12-30 MED ORDER — PROCHLORPERAZINE MALEATE 10 MG PO TABS
ORAL_TABLET | ORAL | Status: AC
  Filled 2018-12-30: qty 1

## 2018-12-30 MED ORDER — SODIUM CHLORIDE 0.9 % IV SOLN
800.0000 mg/m2 | Freq: Once | INTRAVENOUS | Status: AC
  Administered 2018-12-30: 18:00:00 1710 mg via INTRAVENOUS
  Filled 2018-12-30: qty 44.97

## 2018-12-30 MED ORDER — SODIUM CHLORIDE 0.9 % IV SOLN: 800.0000 mg/m2 | Freq: Once | INTRAVENOUS | Status: DC

## 2018-12-30 NOTE — Patient Instructions (Addendum)
[facility] Discharge Instructions for Patients Receiving Chemotherapy  Today you received the following chemotherapy agents Abraxane, Gemzar  To help prevent nausea and vomiting after your treatment, we encourage you to take your nausea medication: As directed by your MD   If you develop nausea and vomiting that is not controlled by your nausea medication, call the clinic.   BELOW ARE SYMPTOMS THAT SHOULD BE REPORTED IMMEDIATELY:  *FEVER GREATER THAN 100.5 F  *CHILLS WITH OR WITHOUT FEVER  NAUSEA AND VOMITING THAT IS NOT CONTROLLED WITH YOUR NAUSEA MEDICATION  *UNUSUAL SHORTNESS OF BREATH  *UNUSUAL BRUISING OR BLEEDING  TENDERNESS IN MOUTH AND THROAT WITH OR WITHOUT PRESENCE OF ULCERS  *URINARY PROBLEMS  *BOWEL PROBLEMS  UNUSUAL RASH Items with * indicate a potential emergency and should be followed up as soon as possible.  Feel free to call the clinic should you have any questions or concerns. The clinic phone number is [phone].  Please show the [facility] at check-in to the Emergency Department and triage nurse.   Gemcitabine injection What is this medicine? GEMCITABINE (jem SYE ta been) is a chemotherapy drug. This medicine is used to treat many types of cancer like breast cancer, lung cancer, pancreatic cancer, and ovarian cancer. This medicine may be used for other purposes; ask your health care provider or pharmacist if you have questions. COMMON BRAND NAME(S): Gemzar, Infugem What should I tell my health care provider before I take this medicine? They need to know if you have any of these conditions: -blood disorders -infection -kidney disease -liver disease -lung or breathing disease, like asthma -recent or ongoing radiation therapy -an unusual or allergic reaction to gemcitabine, other chemotherapy, other medicines, foods, dyes, or preservatives -pregnant or trying to get pregnant -breast-feeding How should I use this  medicine? This drug is given as an infusion into a vein. It is administered in a hospital or clinic by a specially trained health care professional. Talk to your pediatrician regarding the use of this medicine in children. Special care may be needed. Overdosage: If you think you have taken too much of this medicine contact a poison control center or emergency room at once. NOTE: This medicine is only for you. Do not share this medicine with others. What if I miss a dose? It is important not to miss your dose. Call your doctor or health care professional if you are unable to keep an appointment. What may interact with this medicine? -medicines to increase blood counts like filgrastim, pegfilgrastim, sargramostim -some other chemotherapy drugs like cisplatin -vaccines Talk to your doctor or health care professional before taking any of these medicines: -acetaminophen -aspirin -ibuprofen -ketoprofen -naproxen This list may not describe all possible interactions. Give your health care provider a list of all the medicines, herbs, non-prescription drugs, or dietary supplements you use. Also tell them if you smoke, drink alcohol, or use illegal drugs. Some items may interact with your medicine. What should I watch for while using this medicine? Visit your doctor for checks on your progress. This drug may make you feel generally unwell. This is not uncommon, as chemotherapy can affect healthy cells as well as cancer cells. Report any side effects. Continue your course of treatment even though you feel ill unless your doctor tells you to stop. In some cases, you may be given additional medicines to help with side effects. Follow all directions for their use. Call your doctor or health care professional for advice if you get a  fever, chills or sore throat, or other symptoms of a cold or flu. Do not treat yourself. This drug decreases your body's ability to fight infections. Try to avoid being around  people who are sick. This medicine may increase your risk to bruise or bleed. Call your doctor or health care professional if you notice any unusual bleeding. Be careful brushing and flossing your teeth or using a toothpick because you may get an infection or bleed more easily. If you have any dental work done, tell your dentist you are receiving this medicine. Avoid taking products that contain aspirin, acetaminophen, ibuprofen, naproxen, or ketoprofen unless instructed by your doctor. These medicines may hide a fever. Do not become pregnant while taking this medicine or for 6 months after stopping it. Women should inform their doctor if they wish to become pregnant or think they might be pregnant. Men should not father a child while taking this medicine and for 3 months after stopping it. There is a potential for serious side effects to an unborn child. Talk to your health care professional or pharmacist for more information. Do not breast-feed an infant while taking this medicine or for at least 1 week after stopping it. Men should inform their doctors if they wish to father a child. This medicine may lower sperm counts. Talk with your doctor or health care professional if you are concerned about your fertility. What side effects may I notice from receiving this medicine? Side effects that you should report to your doctor or health care professional as soon as possible: -allergic reactions like skin rash, itching or hives, swelling of the face, lips, or tongue -breathing problems -pain, redness, or irritation at site where injected -signs and symptoms of a dangerous change in heartbeat or heart rhythm like chest pain; dizziness; fast or irregular heartbeat; palpitations; feeling faint or lightheaded, falls; breathing problems -signs of decreased platelets or bleeding - bruising, pinpoint red spots on the skin, black, tarry stools, blood in the urine -signs of decreased red blood cells - unusually  weak or tired, feeling faint or lightheaded, falls -signs of infection - fever or chills, cough, sore throat, pain or difficulty passing urine -signs and symptoms of kidney injury like trouble passing urine or change in the amount of urine -signs and symptoms of liver injury like dark yellow or brown urine; general ill feeling or flu-like symptoms; light-colored stools; loss of appetite; nausea; right upper belly pain; unusually weak or tired; yellowing of the eyes or skin -swelling of ankles, feet, hands Side effects that usually do not require medical attention (report to your doctor or health care professional if they continue or are bothersome): -constipation -diarrhea -hair loss -loss of appetite -nausea -rash -vomiting This list may not describe all possible side effects. Call your doctor for medical advice about side effects. You may report side effects to FDA at 1-800-FDA-1088. Where should I keep my medicine? This drug is given in a hospital or clinic and will not be stored at home. NOTE: This sheet is a summary. It may not cover all possible information. If you have questions about this medicine, talk to your doctor, pharmacist, or health care provider.  2019 Elsevier/Gold Standard (2017-10-16 18:06:11)  Coronavirus (COVID-19) Are you at risk?  Nanoparticle Albumin-Bound Paclitaxel injection What is this medicine? NANOPARTICLE ALBUMIN-BOUND PACLITAXEL (Na no PAHR ti kuhl al BYOO muhn-bound PAK li TAX el) is a chemotherapy drug. It targets fast dividing cells, like cancer cells, and causes these cells to die. This  medicine is used to treat advanced breast cancer, lung cancer, and pancreatic cancer. This medicine may be used for other purposes; ask your health care provider or pharmacist if you have questions. COMMON BRAND NAME(S): Abraxane What should I tell my health care provider before I take this medicine? They need to know if you have any of these conditions: -kidney  disease -liver disease -low blood counts, like low white cell, platelet, or red cell counts -lung or breathing disease, like asthma -tingling of the fingers or toes, or other nerve disorder -an unusual or allergic reaction to paclitaxel, albumin, other chemotherapy, other medicines, foods, dyes, or preservatives -pregnant or trying to get pregnant -breast-feeding How should I use this medicine? This drug is given as an infusion into a vein. It is administered in a hospital or clinic by a specially trained health care professional. Talk to your pediatrician regarding the use of this medicine in children. Special care may be needed. Overdosage: If you think you have taken too much of this medicine contact a poison control center or emergency room at once. NOTE: This medicine is only for you. Do not share this medicine with others. What if I miss a dose? It is important not to miss your dose. Call your doctor or health care professional if you are unable to keep an appointment. What may interact with this medicine? This medicine may interact with the following medications: -antiviral medicines for hepatitis, HIV or AIDS -certain antibiotics like erythromycin and clarithromycin -certain medicines for fungal infections like ketoconazole and itraconazole -certain medicines for seizures like carbamazepine, phenobarbital, phenytoin -gemfibrozil -nefazodone -rifampin -St. John's wort This list may not describe all possible interactions. Give your health care provider a list of all the medicines, herbs, non-prescription drugs, or dietary supplements you use. Also tell them if you smoke, drink alcohol, or use illegal drugs. Some items may interact with your medicine. What should I watch for while using this medicine? Your condition will be monitored carefully while you are receiving this medicine. You will need important blood work done while you are taking this medicine. This medicine can cause  serious allergic reactions. If you experience allergic reactions like skin rash, itching or hives, swelling of the face, lips, or tongue, tell your doctor or health care professional right away. In some cases, you may be given additional medicines to help with side effects. Follow all directions for their use. This drug may make you feel generally unwell. This is not uncommon, as chemotherapy can affect healthy cells as well as cancer cells. Report any side effects. Continue your course of treatment even though you feel ill unless your doctor tells you to stop. Call your doctor or health care professional for advice if you get a fever, chills or sore throat, or other symptoms of a cold or flu. Do not treat yourself. This drug decreases your body's ability to fight infections. Try to avoid being around people who are sick. This medicine may increase your risk to bruise or bleed. Call your doctor or health care professional if you notice any unusual bleeding. Be careful brushing and flossing your teeth or using a toothpick because you may get an infection or bleed more easily. If you have any dental work done, tell your dentist you are receiving this medicine. Avoid taking products that contain aspirin, acetaminophen, ibuprofen, naproxen, or ketoprofen unless instructed by your doctor. These medicines may hide a fever. Do not become pregnant while taking this medicine or for 6 months after  stopping it. Women should inform their doctor if they wish to become pregnant or think they might be pregnant. Men should not father a child while taking this medicine or for 3 months after stopping it. There is a potential for serious side effects to an unborn child. Talk to your health care professional or pharmacist for more information. Do not breast-feed an infant while taking this medicine or for 2 weeks after stopping it. This medicine may interfere with the ability to get pregnant or to father a child. You should  talk to your doctor or health care professional if you are concerned about your fertility. What side effects may I notice from receiving this medicine? Side effects that you should report to your doctor or health care professional as soon as possible: -allergic reactions like skin rash, itching or hives, swelling of the face, lips, or tongue -breathing problems -changes in vision -fast, irregular heartbeat -low blood pressure -mouth sores -pain, tingling, numbness in the hands or feet -signs of decreased platelets or bleeding - bruising, pinpoint red spots on the skin, black, tarry stools, blood in the urine -signs of decreased red blood cells - unusually weak or tired, feeling faint or lightheaded, falls -signs of infection - fever or chills, cough, sore throat, pain or difficulty passing urine -signs and symptoms of liver injury like dark yellow or brown urine; general ill feeling or flu-like symptoms; light-colored stools; loss of appetite; nausea; right upper belly pain; unusually weak or tired; yellowing of the eyes or skin -swelling of the ankles, feet, hands -unusually slow heartbeat Side effects that usually do not require medical attention (report to your doctor or health care professional if they continue or are bothersome): -diarrhea -hair loss -loss of appetite -nausea, vomiting -tiredness This list may not describe all possible side effects. Call your doctor for medical advice about side effects. You may report side effects to FDA at 1-800-FDA-1088. Where should I keep my medicine? This drug is given in a hospital or clinic and will not be stored at home. NOTE: This sheet is a summary. It may not cover all possible information. If you have questions about this medicine, talk to your doctor, pharmacist, or health care provider.  2019 Elsevier/Gold Standard (2017-03-26 13:03:45)     Are you at risk for the Coronavirus (COVID-19)?  To be considered HIGH RISK for  Coronavirus (COVID-19), you have to meet the following criteria:  . Traveled to [place], [place], [place], [place] or [place]; or in the [place] to [place], [place], [place], or [place]; and have fever, cough, and shortness of breath within the last 2 weeks of travel OR . Been in close contact with a person diagnosed with COVID-19 within the last 2 weeks and have fever, cough, and shortness of breath . IF YOU DO NOT MEET THESE CRITERIA, YOU ARE CONSIDERED LOW RISK FOR COVID-19.  What to do if you are HIGH RISK for COVID-19?  Marland Kitchen If you are having a medical emergency, call 911. . Seek medical care right away. Before you go to a doctor's office, urgent care or emergency department, call ahead and tell them about your recent travel, contact with someone diagnosed with COVID-19, and your symptoms. You should receive instructions from your physician's office regarding next steps of care.  . When you arrive at healthcare provider, tell the healthcare staff immediately you have returned from visiting [place], [place], [place], [place] or [place]; or traveled in the [place] to [place], [place], or [place]; in the last two weeks or you have been in close contact with a person diagnosed with COVID-19 in the last 2 weeks.   . Tell the health care staff about your symptoms: fever, cough and shortness of breath. . After you have been seen by a medical provider, you will be either: o Tested for (COVID-19) and discharged home on quarantine except to seek medical care if symptoms worsen, and asked to  - Stay home and avoid contact with others until you get your results (4-5 days)  - Avoid travel on public transportation if possible (such as bus, train, or airplane) or o Sent to the Emergency Department by EMS for evaluation, COVID-19 testing, and possible admission depending on your condition and test results.  What to do if you are LOW RISK for COVID-19?  Reduce your risk of any  infection by using the same precautions used for avoiding the common cold or flu:  Marland Kitchen Wash your hands often with soap and warm water for at least 20 seconds.  If soap and water are not readily available, use an alcohol-based hand sanitizer with at least 60% alcohol.  . If coughing or sneezing, cover your mouth and nose by coughing or sneezing into the elbow areas of your shirt or coat, into a tissue or into your sleeve (not your hands). . Avoid shaking hands with others and consider head nods or verbal greetings only. . Avoid touching your eyes, nose, or mouth with unwashed hands.  . Avoid close contact with people who are sick. . Avoid places or events with large numbers of people in one location, like concerts or sporting events. . Carefully consider travel plans you have or are making. . If you are planning any travel outside or inside the [name], visit the CDC's Travelers' Health webpage for the latest health notices. . If you have some symptoms but not all symptoms, continue to monitor at home and seek medical attention if your symptoms worsen. . If you are having a medical emergency, call 911.   [place] / e-Visit: [email or web address]         [facility]: [facility]: [phone]                   [facility]: [phone]

## 2018-12-30 NOTE — Progress Notes (Signed)
ALT=90 per Dr. Burr Medico ok to give chemo treatment today.

## 2018-12-31 ENCOUNTER — Telehealth: Payer: Self-pay | Admitting: Hematology

## 2018-12-31 LAB — CANCER ANTIGEN 19-9: CA 19-9: 42 U/mL — ABNORMAL HIGH (ref 0–35)

## 2018-12-31 NOTE — Telephone Encounter (Signed)
Scheduled appt per 5/26 los. °

## 2019-01-05 ENCOUNTER — Telehealth: Payer: Self-pay | Admitting: Nurse Practitioner

## 2019-01-05 ENCOUNTER — Inpatient Hospital Stay: Payer: Managed Care, Other (non HMO) | Attending: Hematology

## 2019-01-05 ENCOUNTER — Telehealth: Payer: Self-pay

## 2019-01-05 ENCOUNTER — Other Ambulatory Visit: Payer: Self-pay

## 2019-01-05 ENCOUNTER — Inpatient Hospital Stay (HOSPITAL_BASED_OUTPATIENT_CLINIC_OR_DEPARTMENT_OTHER): Payer: Managed Care, Other (non HMO) | Admitting: Nurse Practitioner

## 2019-01-05 ENCOUNTER — Inpatient Hospital Stay: Payer: Managed Care, Other (non HMO)

## 2019-01-05 ENCOUNTER — Encounter: Payer: Self-pay | Admitting: Nurse Practitioner

## 2019-01-05 VITALS — BP 132/78 | HR 76 | Temp 99.2°F | Resp 19 | Ht 72.0 in | Wt 187.0 lb

## 2019-01-05 DIAGNOSIS — Z803 Family history of malignant neoplasm of breast: Secondary | ICD-10-CM

## 2019-01-05 DIAGNOSIS — R5383 Other fatigue: Secondary | ICD-10-CM | POA: Diagnosis not present

## 2019-01-05 DIAGNOSIS — R140 Abdominal distension (gaseous): Secondary | ICD-10-CM | POA: Insufficient documentation

## 2019-01-05 DIAGNOSIS — Z86718 Personal history of other venous thrombosis and embolism: Secondary | ICD-10-CM | POA: Insufficient documentation

## 2019-01-05 DIAGNOSIS — R918 Other nonspecific abnormal finding of lung field: Secondary | ICD-10-CM | POA: Insufficient documentation

## 2019-01-05 DIAGNOSIS — K5900 Constipation, unspecified: Secondary | ICD-10-CM | POA: Diagnosis not present

## 2019-01-05 DIAGNOSIS — Z8042 Family history of malignant neoplasm of prostate: Secondary | ICD-10-CM

## 2019-01-05 DIAGNOSIS — I700 Atherosclerosis of aorta: Secondary | ICD-10-CM

## 2019-01-05 DIAGNOSIS — T451X5S Adverse effect of antineoplastic and immunosuppressive drugs, sequela: Secondary | ICD-10-CM

## 2019-01-05 DIAGNOSIS — C787 Secondary malignant neoplasm of liver and intrahepatic bile duct: Secondary | ICD-10-CM

## 2019-01-05 DIAGNOSIS — Z7901 Long term (current) use of anticoagulants: Secondary | ICD-10-CM | POA: Diagnosis not present

## 2019-01-05 DIAGNOSIS — C257 Malignant neoplasm of other parts of pancreas: Secondary | ICD-10-CM | POA: Insufficient documentation

## 2019-01-05 DIAGNOSIS — R112 Nausea with vomiting, unspecified: Secondary | ICD-10-CM | POA: Insufficient documentation

## 2019-01-05 DIAGNOSIS — G620 Drug-induced polyneuropathy: Secondary | ICD-10-CM | POA: Diagnosis not present

## 2019-01-05 DIAGNOSIS — Z5111 Encounter for antineoplastic chemotherapy: Secondary | ICD-10-CM | POA: Diagnosis not present

## 2019-01-05 DIAGNOSIS — R160 Hepatomegaly, not elsewhere classified: Secondary | ICD-10-CM | POA: Insufficient documentation

## 2019-01-05 DIAGNOSIS — Z79899 Other long term (current) drug therapy: Secondary | ICD-10-CM

## 2019-01-05 DIAGNOSIS — Z800 Family history of malignant neoplasm of digestive organs: Secondary | ICD-10-CM | POA: Insufficient documentation

## 2019-01-05 DIAGNOSIS — F17210 Nicotine dependence, cigarettes, uncomplicated: Secondary | ICD-10-CM | POA: Diagnosis not present

## 2019-01-05 DIAGNOSIS — C259 Malignant neoplasm of pancreas, unspecified: Secondary | ICD-10-CM

## 2019-01-05 DIAGNOSIS — D490 Neoplasm of unspecified behavior of digestive system: Secondary | ICD-10-CM

## 2019-01-05 DIAGNOSIS — R197 Diarrhea, unspecified: Secondary | ICD-10-CM | POA: Insufficient documentation

## 2019-01-05 DIAGNOSIS — R180 Malignant ascites: Secondary | ICD-10-CM | POA: Diagnosis not present

## 2019-01-05 LAB — CBC WITH DIFFERENTIAL (CANCER CENTER ONLY)
Abs Immature Granulocytes: 0.01 10*3/uL (ref 0.00–0.07)
Basophils Absolute: 0 10*3/uL (ref 0.0–0.1)
Basophils Relative: 1 %
Eosinophils Absolute: 0 10*3/uL (ref 0.0–0.5)
Eosinophils Relative: 0 %
HCT: 34.4 % — ABNORMAL LOW (ref 39.0–52.0)
Hemoglobin: 11 g/dL — ABNORMAL LOW (ref 13.0–17.0)
Immature Granulocytes: 0 %
Lymphocytes Relative: 23 %
Lymphs Abs: 0.7 10*3/uL (ref 0.7–4.0)
MCH: 29.3 pg (ref 26.0–34.0)
MCHC: 32 g/dL (ref 30.0–36.0)
MCV: 91.7 fL (ref 80.0–100.0)
Monocytes Absolute: 0.1 10*3/uL (ref 0.1–1.0)
Monocytes Relative: 2 %
Neutro Abs: 2.1 10*3/uL (ref 1.7–7.7)
Neutrophils Relative %: 74 %
Platelet Count: 138 10*3/uL — ABNORMAL LOW (ref 150–400)
RBC: 3.75 MIL/uL — ABNORMAL LOW (ref 4.22–5.81)
RDW: 14.3 % (ref 11.5–15.5)
WBC Count: 2.8 10*3/uL — ABNORMAL LOW (ref 4.0–10.5)
nRBC: 0 % (ref 0.0–0.2)

## 2019-01-05 LAB — CMP (CANCER CENTER ONLY)
ALT: 60 U/L — ABNORMAL HIGH (ref 0–44)
AST: 29 U/L (ref 15–41)
Albumin: 3.4 g/dL — ABNORMAL LOW (ref 3.5–5.0)
Alkaline Phosphatase: 95 U/L (ref 38–126)
Anion gap: 8 (ref 5–15)
BUN: 8 mg/dL (ref 6–20)
CO2: 27 mmol/L (ref 22–32)
Calcium: 9.1 mg/dL (ref 8.9–10.3)
Chloride: 105 mmol/L (ref 98–111)
Creatinine: 0.7 mg/dL (ref 0.61–1.24)
GFR, Est AFR Am: >60 mL/min (ref >60)
GFR, Estimated: >60 mL/min (ref >60)
Glucose, Bld: 134 mg/dL — ABNORMAL HIGH (ref 70–99)
Potassium: 3.9 mmol/L (ref 3.5–5.1)
Sodium: 140 mmol/L (ref 135–145)
Total Bilirubin: <0.2 mg/dL — ABNORMAL LOW (ref 0.3–1.2)
Total Protein: 6.7 g/dL (ref 6.5–8.1)

## 2019-01-05 MED ORDER — PROCHLORPERAZINE MALEATE 10 MG PO TABS
10.0000 mg | ORAL_TABLET | Freq: Once | ORAL | Status: AC
  Administered 2019-01-05: 15:00:00 10 mg via ORAL

## 2019-01-05 MED ORDER — PROCHLORPERAZINE MALEATE 10 MG PO TABS
ORAL_TABLET | ORAL | Status: AC
  Filled 2019-01-05: qty 1

## 2019-01-05 MED ORDER — SODIUM CHLORIDE 0.9 % IV SOLN
1600.0000 mg | Freq: Once | INTRAVENOUS | Status: AC
  Administered 2019-01-05: 1600 mg via INTRAVENOUS
  Filled 2019-01-05: qty 42.08

## 2019-01-05 MED ORDER — PACLITAXEL PROTEIN-BOUND CHEMO INJECTION 100 MG
95.0000 mg/m2 | Freq: Once | INTRAVENOUS | Status: AC
  Administered 2019-01-05: 15:00:00 200 mg via INTRAVENOUS
  Filled 2019-01-05: qty 40

## 2019-01-05 MED ORDER — PACLITAXEL PROTEIN-BOUND CHEMO INJECTION 100 MG
95.0000 mg/m2 | Freq: Once | Status: DC
  Filled 2019-01-05: qty 40

## 2019-01-05 MED ORDER — SODIUM CHLORIDE 0.9 % IV SOLN
Freq: Once | INTRAVENOUS | Status: DC
  Filled 2019-01-05: qty 250

## 2019-01-05 MED ORDER — SODIUM CHLORIDE 0.9 % IV SOLN
Freq: Once | INTRAVENOUS | Status: AC
  Administered 2019-01-05: 15:00:00 via INTRAVENOUS
  Filled 2019-01-05: qty 250

## 2019-01-05 NOTE — Progress Notes (Signed)
[place]   [phone] Fax:([phone]) [phone]   Clinic Follow up Note   Patient Care Team: Street, [name], MD as PCP - General (Family Medicine) 01/05/2019  CHIEF COMPLAINT: f/u pancreatic cancer   SUMMARY OF ONCOLOGIC HISTORY: Oncology History   Cancer Staging Adenocarcinoma of pancreas [facility]) Staging form: Exocrine Pancreas, AJCC 8th Edition - Clinical stage from 06/10/2018: Stage IB (cT2, cN0, cM0) - Signed by [name], MD on 06/29/2018       Adenocarcinoma of pancreas ([place])   06/07/2018 Imaging    CT AP w Contrast IMPRESSION: 1. There is a low attenuation mass centered around the neck of pancreas which is concerning for neoplasm. Pancreatic adenocarcinoma favored. This results in common bile duct obstruction with mild intrahepatic biliary ductal dilatation. There also is involvement of the portal venous confluence. Further evaluation with nonemergent contrast enhanced MRI of the pancreas is recommended. 2. Small indeterminate low-attenuation structure is noted within segment 4 a of the liver. This could be better addressed at MRI.    06/09/2018 Imaging    MR ABD MRCP  The pancreatic mass involves approximately 40 percent of the main portal vein circumference at the portal splenic venous confluence, with associated mild narrowing of the portal splenic venous confluence. The SMV, splenic vein and main, right and left portal veins remain patent. The celiac trunk and SMA are not involved by the pancreatic mass.  IMPRESSION: 2.3 cm diameter hypoenhancing mass at the junction of the head and neck of pancreas with pancreatic ductal dilatation, likely adenocarcinoma.  Intra and extrahepatic bile duct dilatation with abrupt change in caliber at the mid common bile duct. This could indicate an obstructing mass or stricture.     06/10/2018 Initial Biopsy    Diagnosis PANCREAS, FINE NEEDLE ASPIRATION (SPECIMEN 1 OF 1 COLLECTED 06/10/18): MALIGNANT CELLS  CONSISTENT WITH ADENOCARCINOMA.    06/10/2018 Procedure    IMPRESSION: 1. 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 2. Placement of a metallic biliary stent.    06/10/2018 Procedure    EUS per Dr. Paulita Fujita Impression:  - There was dilation in the common bile duct which measured up to 12 mm. - A mass was identified in the pancreatic head. This was staged T3 N1 Mx by endosonographic criteria. Fine needle aspiration performed. - A few lymph nodes were visualized and measured in the peripancreatic region. - There was no evidence of significant pathology in the left lobe of the liver.    06/10/2018 Cancer Staging    Staging form: Exocrine Pancreas, AJCC 8th Edition - Clinical stage from 06/10/2018: Stage IB (cT2, cN0, cM0) - Signed by [name], MD on 06/29/2018    06/27/2018 Initial Diagnosis    Adenocarcinoma of pancreas ([place])    07/02/2018 Imaging    IMPRESSION: 1. Multiple small pulmonary nodules scattered throughout the lungs measuring up to 7 mm in size. These are nonspecific, but the possibility of metastatic disease should be considered, and close attention at time of routine followups is recommended. 2. In addition, today's study demonstrates new and enlarging low-attenuation lesions in the liver. This is poorly evaluated on today's noncontrast CT examination, but is concerning for potential metastatic disease. Further evaluation with repeat nonemergent MRI of the abdomen with and without IV gadolinium is suggested in the near future to better evaluate these findings. 3. Aortic atherosclerosis, in addition to left main and 2 vessel coronary artery disease. Please note that although the presence  of coronary artery calcium documents the presence of coronary artery disease, the severity of this disease and any potential stenosis cannot be assessed on this non-gated CT examination. Assessment for potential risk factor modification,  dietary therapy or pharmacologic therapy may be warranted, if clinically indicated. 4. Mild aneurysmal dilatation of the ascending thoracic aorta (4.8 cm in diameter). Ascending thoracic aortic aneurysm. Recommend semi-annual imaging followup by CTA or MRA and referral to cardiothoracic surgery if not already obtained. This recommendation follows 2010 ACCF/AHA/AATS/ACR/ASA/SCA/SCAI/SIR/STS/SVM Guidelines for the Diagnosis and Management of Patients With Thoracic Aortic Disease. Circulation. 2010; 121: D408-X448.  Aortic Atherosclerosis (ICD10-I70.0). Aortic aneurysm NOS (ICD10-I71.9).    07/04/2018 - 10/16/2018 Chemotherapy     FOLFIRINOX q2 weeks    07/15/2018 Imaging    07/15/2018 Liver US IMPRESSION: No liver lesions identified with ultrasound. Ultrasound-guided biopsy was not performed. Recommend further characterization for liver lesions with a repeat MRI, with and without contrast.    07/24/2018 Imaging    07/24/2018 MRI Abdomen IMPRESSION: 1. The hypoenhancing mass at the junction of the pancreatic body and head has reduced in size, previously 3.2 by 2.8 cm and currently 2.9 by 2.2 cm. A small peripancreatic lymph node adjacent to the mass was previously 0.9 cm in short axis and is currently 0.7 cm in short axis. The amount of contact between the pancreatic mass and the confluence of the splenic vein and SMV is similar to the prior exam. 2. There is a 6 mm probable hemangioma in segment 4a of the liver, based on the delayed enhancement pattern. This is somewhat ill-defined. The lesion appeared larger on the prior noncontrast CT but presumably may have been overestimated on that exam. There is also some hypodensity along the dome of the right hepatic lobe which appears to most likely be due to a slip of the diaphragm rather than a discernible lesion on MRI. 3. Aortic Atherosclerosis (ICD10-I70.0). Notably, there is a small amount of mural thrombus along the right side of  the abdominal aorta below the right renal artery level which has not been seen previously.    09/03/2018 Imaging    09/03/2018 MRI Abdomen IMPRESSION: 1. Stable mass (adenocarcinoma) in the neck of the pancreas with upstream duct dilatation. 2. No evidence of lymphadenopathy in the porta hepatis or peripancreatic fat. 3. No evidence hepatic metastasis. 4. Mild biliary duct dilatation LEFT hepatic lobe similar to comparison exam. Biliary stent within the common bile duct.    09/16/2018 Imaging    MR MRA CHEST W WO CONTRAST  IMPRESSION: VASCULAR  1. 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 2. Left subclavian port catheter to the SVC. 3. SVC is patent      10/24/2018 Imaging    MRI abdomen  IMPRESSION: 1. Slight interval decrease in size of the pancreatic mass. 2. Stable to slightly smaller adjacent lymph nodes. 3. No findings for metastatic disease involving the liver or lung bases. 4. Common bile duct stent in good position without complicating features.    11/10/2018 Surgery    PAC removal, Laparoscopic Cholecystectomy and Diagnostic Laparoscopic Liver Biopsy by Dre. Byerly  and Dr. Lucia Gaskins  11/10/18     11/10/2018 Pathology Results    Diagnosis 11/10/18 1. Liver, biopsy, Left - ADENOCARCINOMA. - SEE COMMENT. 2. Gallbladder - CHRONIC CHOLECYSTITIS WITH CHOLELITHIASIS. - ONE MORPHOLOGICALLY BENIGN LYMPH NODE.    12/10/2018 Imaging    CT AP  IMPRESSION: 1. Interval resolution of gallbladder fossa abscess. The percutaneous drainage  catheter remains in good position. 2. Patent metallic biliary stent with expected pneumobilia. 3. Decreasing size of low-attenuation in hepatic segment 5 adjacent to the gallbladder fossa likely representing a small resolving intrahepatic abscess. 4. Additional ancillary findings as above without significant interval change.    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  The CustomNext-Expanded gene panel offered by [facility] and includes sequencing and rearrangement analysis for the following 81 genes: AIP, ALK, APC*, ATM*, AXIN2, BAP1, BARD1, BLM, BMPR1A, BRCA1*, BRCA2*, BRIP1*, CDC73, CDH1*, CDK4, CDKN1B, CDKN2A, CHEK2*, CTNNA1, DICER1, FANCC, FH, FLCN, GALNT12, HOXB13, KIT, MAX, MEN1, MET, MLH1*, MRE11A, MSH2*, MSH6*, MUTYH*, NBN, NF1*, NF2, NTHL1, PALB2*, PDGFRA, PHOX2B, PMS2*, POLD1, POLE, POT1, PRKAR1A, PTCH1, PTEN*, RAD50, RAD51C*, RAD51D*, RB1, RET, SDHA, SDHAF2, SDHB, SDHC, SDHD, SMAD4, SMARCA4, SMARCB1, SMARCE1, STK11, SUFU, TMEM127, TP53*, TSC1, TSC2, VHL and XRCC2 (sequencing and deletion/duplication); CASR, CFTR, CPA1, CTRC, EGFR, MITF, PRSS1 and SPINK1 (sequencing only); EPCAM and GREM1 (deletion/duplication only). DNA and RNA analyses performed for * genes. The report date is Dec 19, 2018.    12/22/2018 -  Chemotherapy    second-line chemo with Gemcitabine and Abraxane 2-3 weeks on/1 week off starting 12/22/18 which single agent gemcitabine for cycle 1. Added Abraxane with cycle C1D8.      12/26/2018 Imaging    CT AP IMPRESSION: 1. No evidence of bowel obstruction. 2. New small amount of ascites with increased nodular soft tissue stranding throughout the omental fat. This could be inflammatory and indicate peritonitis. Early peritoneal carcinomatosis would be another explanation. 3. Patent biliary stent and persistent pneumobilia. No residual fluid collection identified in the cholecystectomy bed following percutaneous drain removal.     CURRENT THERAPY: second-line chemo with Gemcitabine and Abraxane 2-3 weeks on/1 week offstarting 12/22/18 with single agent Gemcitabine for C1D1. Added Abraxane with C1D8 on 12/30/18.    INTERVAL HISTORY: Mr. Leider returns today for f/u and treatment day 15 as scheduled. He completed cycle 1 day 8 dose-reduced gem/abraxane on 12/30/18. He did pretty well,  notes chemo was not as bad as FOLFIRINOX regimen. He was fatigued for 2 days but able to function well with rest periods. Eating and drinking well. Constipation improved so he stopped miralax, now taking colace daily. Had small amount of blood in stool with BM today, had to strain. Abdominal pain is stable, worse with larger meals. He ate two dinners last night and pain increased, but better now. He has numbness and tingling to fingers and toes, which began with oxaliplatin, and seems increased now but still fluctuates. Fingertips are sensitive. No functional limitations. Otherwise, he denies fever, chills, mucositis, epistaxis, n/v, cough, chest pain, dyspnea, or leg edema.    MEDICAL HISTORY:  Past Medical History:  Diagnosis Date  . Anxiety   . Cancer ([place]) 06/2018   Pancreatic  . Family history of breast cancer   . Family history of colonic polyps   . Family history of pancreatic cancer   . Family history of prostate cancer   . GERD (gastroesophageal reflux disease)     SURGICAL HISTORY: Past Surgical History:  Procedure Laterality Date  . BILIARY STENT PLACEMENT  06/10/2018   Procedure: BILIARY STENT PLACEMENT;  Surgeon: Ronnette Juniper, MD;  Location: [facility] ENDOSCOPY;  Service: Gastroenterology;;  . CHOLECYSTECTOMY N/A 11/10/2018   Procedure: Laparoscopic Cholecystectomy;  Surgeon: Stark Klein, MD;  Location: [place];  Service: General;  Laterality: N/A;  . DIAGNOSTIC LAPAROSCOPIC LIVER BIOPSY N/A 11/10/2018  Procedure: Diagnostic Laparoscopic Liver Biopsy;  Surgeon: Stark Klein, MD;  Location: [place];  Service: General;  Laterality: N/A;  . ERCP N/A 06/10/2018   Procedure: ENDOSCOPIC RETROGRADE CHOLANGIOPANCREATOGRAPHY (ERCP);  Surgeon: Ronnette Juniper, MD;  Location: [place];  Service: Gastroenterology;  Laterality: N/A;  . ESOPHAGOGASTRODUODENOSCOPY (EGD) WITH PROPOFOL N/A 06/10/2018   Procedure: ESOPHAGOGASTRODUODENOSCOPY (EGD) WITH PROPOFOL;  Surgeon: Ronnette Juniper, MD;  Location: [place];  Service: Gastroenterology;  Laterality: N/A;  . FINE NEEDLE ASPIRATION  06/10/2018   Procedure: FINE NEEDLE ASPIRATION (FNA) LINEAR;  Surgeon: Ronnette Juniper, MD;  Location: [place];  Service: Gastroenterology;;  . IR RADIOLOGIST EVAL & MGMT  12/10/2018  . PORT-A-CATH REMOVAL N/A 11/10/2018   Procedure: REMOVAL PORT-A-CATH;  Surgeon: Stark Klein, MD;  Location: [place];  Service: General;  Laterality: N/A;  . PORTACATH PLACEMENT N/A 07/02/2018   Procedure: INSERTION PORT-A-CATH;  Surgeon: Stark Klein, MD;  Location: [place];  Service: General;  Laterality: N/A;  . SPHINCTEROTOMY  06/10/2018   Procedure: SPHINCTEROTOMY;  Surgeon: Ronnette Juniper, MD;  Location: [facility] ENDOSCOPY;  Service: Gastroenterology;;  . UPPER ESOPHAGEAL ENDOSCOPIC ULTRASOUND (EUS) N/A 06/10/2018   Procedure: UPPER ESOPHAGEAL ENDOSCOPIC ULTRASOUND (EUS);  Surgeon: Ronnette Juniper, MD;  Location: [place];  Service: Gastroenterology;  Laterality: N/A;  . VASECTOMY      I have reviewed the social history and family history with the patient and they are unchanged from previous note.  ALLERGIES:  is allergic to contrast media [iodinated diagnostic agents] and iohexol.  MEDICATIONS:  Current Outpatient Medications  Medication Sig Dispense Refill  . docusate sodium (COLACE) 100 MG capsule Take 200 mg by mouth daily as needed for mild constipation.    Marland Kitchen esomeprazole (NEXIUM) 20 MG capsule Take 20 mg by mouth daily after breakfast.     . folic acid (FOLVITE) 1 MG tablet Take 1 tablet (1 mg total) by mouth daily after breakfast. 30 tablet 3  . magic mouthwash w/lidocaine SOLN Take 5 mLs by mouth 4 (four) times daily as needed for mouth pain. 240 mL 2  . polyethylene glycol (MIRALAX / GLYCOLAX) 17 g packet Take 17 g by mouth 2 (two) times daily.    . rivaroxaban (XARELTO) 20 MG TABS tablet Take 1 tablet (20 mg total) by mouth daily with supper. (Patient taking differently: Take 20 mg by mouth daily after breakfast. ) 30 tablet 2   . diphenoxylate-atropine (LOMOTIL) 2.5-0.025 MG tablet Take 2 tablets by mouth 4 (four) times daily as needed for diarrhea or loose stools. (Patient not taking: Reported on 12/26/2018) 40 tablet 0  . ondansetron (ZOFRAN) 8 MG tablet Take 1 tablet (8 mg total) by mouth every 8 (eight) hours as needed for nausea or vomiting. 30 tablet 1  . prochlorperazine (COMPAZINE) 10 MG tablet Take 1 tablet (10 mg total) by mouth every 6 (six) hours as needed (NAUSEA). 30 tablet 1  . simethicone (MYLICON) 308 MG chewable tablet Chew 250 mg by mouth every 6 (six) hours as needed for flatulence.     . Syringe, Disposable, (10-12CC SYRINGE) 12 ML MISC 10 mLs by Does not apply route as needed. 1 each 1   No current facility-administered medications for this visit.    Facility-Administered Medications Ordered in Other Visits  Medication Dose Route Frequency Provider Last Rate Last Dose  . 0.9 %  sodium chloride infusion   Intravenous Once [name], MD      . gemcitabine (GEMZAR) 1,600 mg in sodium chloride 0.9 % 250 mL  chemo infusion  1,600 mg Intravenous Once [name], MD      . PACLitaxel-protein bound (ABRAXANE) chemo infusion 200 mg  95 mg/m2 (Treatment Plan Recorded) Intravenous Once [name], MD        PHYSICAL EXAMINATION: ECOG PERFORMANCE STATUS: 1 - Symptomatic but completely ambulatory  Vitals:   01/05/19 1302  BP: 132/78  Pulse: 76  Resp: 19  Temp: 99.2 F (37.3 C)  SpO2: 99%   Filed Weights   01/05/19 1302  Weight: 187 lb (84.8 kg)    GENERAL:alert, no distress and comfortable SKIN: no obvious rash  EYES:  sclera clear LUNGS: respirations even and unlabored  HEART:  no lower extremity edema ABDOMEN: surgical incisions are well healed  NEURO: alert & oriented x 3 with fluent speech; mildly decreased vibratory sense to fingertips per tuning fork exam No PAC Limited exam for covid19 outbreak  LABORATORY DATA:  I have reviewed the data as listed CBC Latest Ref Rng & Units 01/05/2019  12/30/2018 12/26/2018  WBC 4.0 - 10.5 K/uL 2.8(L) 5.4 4.3  Hemoglobin 13.0 - 17.0 g/dL 11.0(L) 11.7(L) 11.3(L)  Hematocrit 39.0 - 52.0 % 34.4(L) 35.9(L) 35.2(L)  Platelets 150 - 400 K/uL 138(L) 121(L) 171     CMP Latest Ref Rng & Units 01/05/2019 12/30/2018 12/26/2018  Glucose 70 - 99 mg/dL 134(H) 103(H) 120(H)  BUN 6 - 20 mg/dL 8 6 11   Creatinine 0.61 - 1.24 mg/dL 0.70 0.72 0.62  Sodium 135 - 145 mmol/L 140 138 134(L)  Potassium 3.5 - 5.1 mmol/L 3.9 3.9 3.8  Chloride 98 - 111 mmol/L 105 104 99  CO2 22 - 32 mmol/L 27 26 25   Calcium 8.9 - 10.3 mg/dL 9.1 9.1 8.7(L)  Total Protein 6.5 - 8.1 g/dL 6.7 6.8 7.5  Total Bilirubin 0.3 - 1.2 mg/dL <0.2(L) 0.3 0.5  Alkaline Phos 38 - 126 U/L 95 107 101  AST 15 - 41 U/L 29 32 60(H)  ALT 0 - 44 U/L 60(H) 90(H) 67(H)      RADIOGRAPHIC STUDIES: I have personally reviewed the radiological images as listed and agreed with the findings in the report. No results found.   ASSESSMENT & PLAN: Ernest Wu a 58 y.o.malewith   1. Adenocarcinoma of the pancreas,cT3N1M0, borderline resectable, liver metastasis in 11/2018 -Diagnosed in 06/2018. Treated with neoadjuvant chemoFOLFIRINOX. -He underwent Cholecystectomy and liver biopsy on 11/10/18 with Dr. Miles Costain Whipple surgery wasaborted due to new liver mets found during surgery. -after his surgery he developed anabscessat surgical sitewhich caused sepsis secondary to GNR bacteremia. He was treated with antibiotics and JP drain.drain removed per IR on 12/10/18 -He has recovered well, VSS, lab reviewed. CBC and CMP are stable. Mild anemia, BG 147; otherwise labs unremarkable -He developed lower abdominal pain few weeks ago, and constipation. Pain is worse if no BM.  -CT on 12/10/18 showed no obstruction, hernia, or abdominopelvic abnormality. I feel his pain is related to constipation. I recommended to increase miralax to BID and continue colace.  -his pain exacerbated and he went to ED on 5/22, CT  was negative for bowel obstruction but did show new small amount of ascites and increased nodular soft tissue stranding throughout omentum. This was reported as potentially inflammatory vs early peritoneal carcinomatosis. -he needs CT chest to complete restaging. Will obtain asap.  -he completed cycle 1 day 8 with dose-reduced gemcitabine and abraxane; he tolerated well overall with 2 days mild-moderate fatigue and increased neuropathy. He functions well. He has recovered well, has good  performance status -I recommend cryotherapy during chemo, especially abraxane, if he can tolerate -Labs reviewed, CBC and CMP are stable. PLT 138K. CA 19-9 elevated to 42 on 5/26, result is pending today -will proceed with cycle 1 day 15 today; increase abraxane to 100 mg/m2. The patient is aware, he agrees.  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 No other bleeding -PLT 138K -continue monitoring   4. Constipation -Secondary to chemo and premedications. -He takes Miralax daily and colace BID -constipation worsened in the last several weeks and he developed abdominal pain; pain worsens if no BM -CT AP negative for bowel obstruction -constipation improved lately, he stopped taking miralax;  -I recommend he continue to take once daily to reduce pain related to constipation, can take 1/2 dose if needed. Continue colace  -monitoring   5.Sepsis secondary toHafniabacteriemia, s/p JP drain placement  -treated with antibiotics and draining ofabdominalabscess. -drain removed per IR on 12/10/18 -drain site has healed well  -resolved  6. Peripheral neuropathy -initially began during FOLFIRINOX, likely related to  oxaliplatin -increased slightly on gemcitabine/abraxane regimen -vibratory sense is slightly decreased -he remains able to function well -I recommend cryotherapy during chemo, especially abraxane -will monitor closely   Plan -Labs reviewed, CA 19-9 pending  -Proceed with cycle 1 day 15, increase abraxane to 100 mg/m2  -CT chest wo contrast (allergy) this week  -cryotherapy during chemo for neuropathy  -f/u in 2 weeks with cycle 2    Orders Placed This Encounter  Procedures  . CT Chest Wo Contrast    Standing Status:   Future    Standing Expiration Date:   01/05/2020    Order Specific Question:   Preferred imaging location?    Answer:   [facility]    Order Specific Question:   Radiology Contrast Protocol - do NOT remove file path    Answer:   [email or web address]   All questions were answered. The patient knows to call the clinic with any problems, questions or concerns. No barriers to learning was detected.     Alla Feeling, NP 01/05/19

## 2019-01-05 NOTE — Telephone Encounter (Signed)
Scheduled appt pet 6/1 los.

## 2019-01-05 NOTE — Telephone Encounter (Signed)
Per Lacie called [facility] scheduling [phone]) to schedule patient a chest CT. CT was scheduled for Thursday 01/08/19 @ 2:45p here at [facility]. Patient aware appointment date and time.

## 2019-01-05 NOTE — Patient Instructions (Signed)
[facility] Discharge Instructions for Patients Receiving Chemotherapy  Today you received the following chemotherapy agents: Abraxane and Gemzar.  To help prevent nausea and vomiting after your treatment, we encourage you to take your nausea medication: As directed by your MD   If you develop nausea and vomiting that is not controlled by your nausea medication, call the clinic.   BELOW ARE SYMPTOMS THAT SHOULD BE REPORTED IMMEDIATELY:  *FEVER GREATER THAN 100.5 F  *CHILLS WITH OR WITHOUT FEVER  NAUSEA AND VOMITING THAT IS NOT CONTROLLED WITH YOUR NAUSEA MEDICATION  *UNUSUAL SHORTNESS OF BREATH  *UNUSUAL BRUISING OR BLEEDING  TENDERNESS IN MOUTH AND THROAT WITH OR WITHOUT PRESENCE OF ULCERS  *URINARY PROBLEMS  *BOWEL PROBLEMS  UNUSUAL RASH Items with * indicate a potential emergency and should be followed up as soon as possible.  Feel free to call the clinic should you have any questions or concerns. The clinic phone number is [phone].  Please show the [facility] at check-in to the Emergency Department and triage nurse.   Gemcitabine injection What is this medicine? GEMCITABINE (jem SYE ta been) is a chemotherapy drug. This medicine is used to treat many types of cancer like breast cancer, lung cancer, pancreatic cancer, and ovarian cancer. This medicine may be used for other purposes; ask your health care provider or pharmacist if you have questions. COMMON BRAND NAME(S): Gemzar, Infugem What should I tell my health care provider before I take this medicine? They need to know if you have any of these conditions: -blood disorders -infection -kidney disease -liver disease -lung or breathing disease, like asthma -recent or ongoing radiation therapy -an unusual or allergic reaction to gemcitabine, other chemotherapy, other medicines, foods, dyes, or preservatives -pregnant or trying to get pregnant -breast-feeding How should I use this  medicine? This drug is given as an infusion into a vein. It is administered in a hospital or clinic by a specially trained health care professional. Talk to your pediatrician regarding the use of this medicine in children. Special care may be needed. Overdosage: If you think you have taken too much of this medicine contact a poison control center or emergency room at once. NOTE: This medicine is only for you. Do not share this medicine with others. What if I miss a dose? It is important not to miss your dose. Call your doctor or health care professional if you are unable to keep an appointment. What may interact with this medicine? -medicines to increase blood counts like filgrastim, pegfilgrastim, sargramostim -some other chemotherapy drugs like cisplatin -vaccines Talk to your doctor or health care professional before taking any of these medicines: -acetaminophen -aspirin -ibuprofen -ketoprofen -naproxen This list may not describe all possible interactions. Give your health care provider a list of all the medicines, herbs, non-prescription drugs, or dietary supplements you use. Also tell them if you smoke, drink alcohol, or use illegal drugs. Some items may interact with your medicine. What should I watch for while using this medicine? Visit your doctor for checks on your progress. This drug may make you feel generally unwell. This is not uncommon, as chemotherapy can affect healthy cells as well as cancer cells. Report any side effects. Continue your course of treatment even though you feel ill unless your doctor tells you to stop. In some cases, you may be given additional medicines to help with side effects. Follow all directions for their use. Call your doctor or health care professional for advice if you get  a fever, chills or sore throat, or other symptoms of a cold or flu. Do not treat yourself. This drug decreases your body's ability to fight infections. Try to avoid being around  people who are sick. This medicine may increase your risk to bruise or bleed. Call your doctor or health care professional if you notice any unusual bleeding. Be careful brushing and flossing your teeth or using a toothpick because you may get an infection or bleed more easily. If you have any dental work done, tell your dentist you are receiving this medicine. Avoid taking products that contain aspirin, acetaminophen, ibuprofen, naproxen, or ketoprofen unless instructed by your doctor. These medicines may hide a fever. Do not become pregnant while taking this medicine or for 6 months after stopping it. Women should inform their doctor if they wish to become pregnant or think they might be pregnant. Men should not father a child while taking this medicine and for 3 months after stopping it. There is a potential for serious side effects to an unborn child. Talk to your health care professional or pharmacist for more information. Do not breast-feed an infant while taking this medicine or for at least 1 week after stopping it. Men should inform their doctors if they wish to father a child. This medicine may lower sperm counts. Talk with your doctor or health care professional if you are concerned about your fertility. What side effects may I notice from receiving this medicine? Side effects that you should report to your doctor or health care professional as soon as possible: -allergic reactions like skin rash, itching or hives, swelling of the face, lips, or tongue -breathing problems -pain, redness, or irritation at site where injected -signs and symptoms of a dangerous change in heartbeat or heart rhythm like chest pain; dizziness; fast or irregular heartbeat; palpitations; feeling faint or lightheaded, falls; breathing problems -signs of decreased platelets or bleeding - bruising, pinpoint red spots on the skin, black, tarry stools, blood in the urine -signs of decreased red blood cells - unusually  weak or tired, feeling faint or lightheaded, falls -signs of infection - fever or chills, cough, sore throat, pain or difficulty passing urine -signs and symptoms of kidney injury like trouble passing urine or change in the amount of urine -signs and symptoms of liver injury like dark yellow or brown urine; general ill feeling or flu-like symptoms; light-colored stools; loss of appetite; nausea; right upper belly pain; unusually weak or tired; yellowing of the eyes or skin -swelling of ankles, feet, hands Side effects that usually do not require medical attention (report to your doctor or health care professional if they continue or are bothersome): -constipation -diarrhea -hair loss -loss of appetite -nausea -rash -vomiting This list may not describe all possible side effects. Call your doctor for medical advice about side effects. You may report side effects to FDA at 1-800-FDA-1088. Where should I keep my medicine? This drug is given in a hospital or clinic and will not be stored at home. NOTE: This sheet is a summary. It may not cover all possible information. If you have questions about this medicine, talk to your doctor, pharmacist, or health care provider.  2019 Elsevier/Gold Standard (2017-10-16 18:06:11)  Coronavirus (COVID-19) Are you at risk?  Nanoparticle Albumin-Bound Paclitaxel injection What is this medicine? NANOPARTICLE ALBUMIN-BOUND PACLITAXEL (Na no PAHR ti kuhl al BYOO muhn-bound PAK li TAX el) is a chemotherapy drug. It targets fast dividing cells, like cancer cells, and causes these cells to die.  This medicine is used to treat advanced breast cancer, lung cancer, and pancreatic cancer. This medicine may be used for other purposes; ask your health care provider or pharmacist if you have questions. COMMON BRAND NAME(S): Abraxane What should I tell my health care provider before I take this medicine? They need to know if you have any of these conditions: -kidney  disease -liver disease -low blood counts, like low white cell, platelet, or red cell counts -lung or breathing disease, like asthma -tingling of the fingers or toes, or other nerve disorder -an unusual or allergic reaction to paclitaxel, albumin, other chemotherapy, other medicines, foods, dyes, or preservatives -pregnant or trying to get pregnant -breast-feeding How should I use this medicine? This drug is given as an infusion into a vein. It is administered in a hospital or clinic by a specially trained health care professional. Talk to your pediatrician regarding the use of this medicine in children. Special care may be needed. Overdosage: If you think you have taken too much of this medicine contact a poison control center or emergency room at once. NOTE: This medicine is only for you. Do not share this medicine with others. What if I miss a dose? It is important not to miss your dose. Call your doctor or health care professional if you are unable to keep an appointment. What may interact with this medicine? This medicine may interact with the following medications: -antiviral medicines for hepatitis, HIV or AIDS -certain antibiotics like erythromycin and clarithromycin -certain medicines for fungal infections like ketoconazole and itraconazole -certain medicines for seizures like carbamazepine, phenobarbital, phenytoin -gemfibrozil -nefazodone -rifampin -St. John's wort This list may not describe all possible interactions. Give your health care provider a list of all the medicines, herbs, non-prescription drugs, or dietary supplements you use. Also tell them if you smoke, drink alcohol, or use illegal drugs. Some items may interact with your medicine. What should I watch for while using this medicine? Your condition will be monitored carefully while you are receiving this medicine. You will need important blood work done while you are taking this medicine. This medicine can cause  serious allergic reactions. If you experience allergic reactions like skin rash, itching or hives, swelling of the face, lips, or tongue, tell your doctor or health care professional right away. In some cases, you may be given additional medicines to help with side effects. Follow all directions for their use. This drug may make you feel generally unwell. This is not uncommon, as chemotherapy can affect healthy cells as well as cancer cells. Report any side effects. Continue your course of treatment even though you feel ill unless your doctor tells you to stop. Call your doctor or health care professional for advice if you get a fever, chills or sore throat, or other symptoms of a cold or flu. Do not treat yourself. This drug decreases your body's ability to fight infections. Try to avoid being around people who are sick. This medicine may increase your risk to bruise or bleed. Call your doctor or health care professional if you notice any unusual bleeding. Be careful brushing and flossing your teeth or using a toothpick because you may get an infection or bleed more easily. If you have any dental work done, tell your dentist you are receiving this medicine. Avoid taking products that contain aspirin, acetaminophen, ibuprofen, naproxen, or ketoprofen unless instructed by your doctor. These medicines may hide a fever. Do not become pregnant while taking this medicine or for 6 months  after stopping it. Women should inform their doctor if they wish to become pregnant or think they might be pregnant. Men should not father a child while taking this medicine or for 3 months after stopping it. There is a potential for serious side effects to an unborn child. Talk to your health care professional or pharmacist for more information. Do not breast-feed an infant while taking this medicine or for 2 weeks after stopping it. This medicine may interfere with the ability to get pregnant or to father a child. You should  talk to your doctor or health care professional if you are concerned about your fertility. What side effects may I notice from receiving this medicine? Side effects that you should report to your doctor or health care professional as soon as possible: -allergic reactions like skin rash, itching or hives, swelling of the face, lips, or tongue -breathing problems -changes in vision -fast, irregular heartbeat -low blood pressure -mouth sores -pain, tingling, numbness in the hands or feet -signs of decreased platelets or bleeding - bruising, pinpoint red spots on the skin, black, tarry stools, blood in the urine -signs of decreased red blood cells - unusually weak or tired, feeling faint or lightheaded, falls -signs of infection - fever or chills, cough, sore throat, pain or difficulty passing urine -signs and symptoms of liver injury like dark yellow or brown urine; general ill feeling or flu-like symptoms; light-colored stools; loss of appetite; nausea; right upper belly pain; unusually weak or tired; yellowing of the eyes or skin -swelling of the ankles, feet, hands -unusually slow heartbeat Side effects that usually do not require medical attention (report to your doctor or health care professional if they continue or are bothersome): -diarrhea -hair loss -loss of appetite -nausea, vomiting -tiredness This list may not describe all possible side effects. Call your doctor for medical advice about side effects. You may report side effects to FDA at 1-800-FDA-1088. Where should I keep my medicine? This drug is given in a hospital or clinic and will not be stored at home. NOTE: This sheet is a summary. It may not cover all possible information. If you have questions about this medicine, talk to your doctor, pharmacist, or health care provider.  2019 Elsevier/Gold Standard (2017-03-26 13:03:45)     Are you at risk for the Coronavirus (COVID-19)?  To be considered HIGH RISK for  Coronavirus (COVID-19), you have to meet the following criteria:  . Traveled to [place], [place], [place], [place] or [place]; or in the [place] to [place], [place], [place], or [place]; and have fever, cough, and shortness of breath within the last 2 weeks of travel OR . Been in close contact with a person diagnosed with COVID-19 within the last 2 weeks and have fever, cough, and shortness of breath . IF YOU DO NOT MEET THESE CRITERIA, YOU ARE CONSIDERED LOW RISK FOR COVID-19.  What to do if you are HIGH RISK for COVID-19?  Marland Kitchen If you are having a medical emergency, call 911. . Seek medical care right away. Before you go to a doctor's office, urgent care or emergency department, call ahead and tell them about your recent travel, contact with someone diagnosed with COVID-19, and your symptoms. You should receive instructions from your physician's office regarding next steps of care.  . When you arrive at healthcare provider, tell the healthcare staff immediately you have returned from visiting [place], [place], [place], [place] or [place]; or traveled in [place] to [place],  [place], [place], or [place]; in the last two weeks or you have been in close contact with a person diagnosed with COVID-19 in the last 2 weeks.   . Tell the health care staff about your symptoms: fever, cough and shortness of breath. . After you have been seen by a medical provider, you will be either: o Tested for (COVID-19) and discharged home on quarantine except to seek medical care if symptoms worsen, and asked to  - Stay home and avoid contact with others until you get your results (4-5 days)  - Avoid travel on public transportation if possible (such as bus, train, or airplane) or o Sent to the Emergency Department by EMS for evaluation, COVID-19 testing, and possible admission depending on your condition and test results.  What to do if you are LOW RISK for COVID-19?  Reduce your risk of any  infection by using the same precautions used for avoiding the common cold or flu:  Marland Kitchen Wash your hands often with soap and warm water for at least 20 seconds.  If soap and water are not readily available, use an alcohol-based hand sanitizer with at least 60% alcohol.  . If coughing or sneezing, cover your mouth and nose by coughing or sneezing into the elbow areas of your shirt or coat, into a tissue or into your sleeve (not your hands). . Avoid shaking hands with others and consider head nods or verbal greetings only. . Avoid touching your eyes, nose, or mouth with unwashed hands.  . Avoid close contact with people who are sick. . Avoid places or events with large numbers of people in one location, like concerts or sporting events. . Carefully consider travel plans you have or are making. . If you are planning any travel outside or inside the [name], visit the CDC's Travelers' Health webpage for the latest health notices. . If you have some symptoms but not all symptoms, continue to monitor at home and seek medical attention if your symptoms worsen. . If you are having a medical emergency, call 911.   [facility] / e-Visit: [email or web address]         [facility]: [facility]: [phone]                   [facility]: [phone]

## 2019-01-06 ENCOUNTER — Other Ambulatory Visit: Payer: Self-pay | Admitting: Pharmacist

## 2019-01-06 LAB — CANCER ANTIGEN 19-9: CA 19-9: 46 U/mL — ABNORMAL HIGH (ref 0–35)

## 2019-01-08 ENCOUNTER — Other Ambulatory Visit: Payer: Self-pay

## 2019-01-08 ENCOUNTER — Ambulatory Visit (HOSPITAL_COMMUNITY)
Admission: RE | Admit: 2019-01-08 | Discharge: 2019-01-08 | Disposition: A | Discharge status: Home / Self Care | Payer: Managed Care, Other (non HMO) | Source: Ambulatory Visit | Attending: Nurse Practitioner | Admitting: Nurse Practitioner

## 2019-01-08 ENCOUNTER — Encounter (HOSPITAL_COMMUNITY): Payer: Self-pay

## 2019-01-08 DIAGNOSIS — D490 Neoplasm of unspecified behavior of digestive system: Secondary | ICD-10-CM | POA: Diagnosis not present

## 2019-01-08 MED ORDER — RIVAROXABAN 20 MG PO TABS: 20.0000 mg | ORAL_TABLET | Freq: Every day | ORAL | 2 refills | Status: DC

## 2019-01-15 ENCOUNTER — Telehealth: Payer: Self-pay | Admitting: Hematology

## 2019-01-15 NOTE — Progress Notes (Signed)
[place]   [phone] [phone]   Clinic Follow up Note   Patient Care Team: Street, [name], MD as PCP - General (Family Medicine)  Date of Service:  01/19/2019  CHIEF COMPLAINT: F/u pancreatic cancer  SUMMARY OF ONCOLOGIC HISTORY: Oncology History Overview Note  Cancer Staging Adenocarcinoma of pancreas [facility]) Staging form: Exocrine Pancreas, AJCC 8th Edition - Clinical stage from 06/10/2018: Stage IB (cT2, cN0, cM0) - Signed by [name], MD on 06/29/2018     Adenocarcinoma of pancreas ([place])  06/07/2018 Imaging   CT AP w Contrast IMPRESSION: 1. There is a low attenuation mass centered around the neck of pancreas which is concerning for neoplasm. Pancreatic adenocarcinoma favored. This results in common bile duct obstruction with mild intrahepatic biliary ductal dilatation. There also is involvement of the portal venous confluence. Further evaluation with nonemergent contrast enhanced MRI of the pancreas is recommended. 2. Small indeterminate low-attenuation structure is noted within segment 4 a of the liver. This could be better addressed at MRI.   06/09/2018 Imaging   MR ABD MRCP  The pancreatic mass involves approximately 40 percent of the main portal vein circumference at the portal splenic venous confluence, with associated mild narrowing of the portal splenic venous confluence. The SMV, splenic vein and main, right and left portal veins remain patent. The celiac trunk and SMA are not involved by the pancreatic mass.  IMPRESSION: 2.3 cm diameter hypoenhancing mass at the junction of the head and neck of pancreas with pancreatic ductal dilatation, likely adenocarcinoma.  Intra and extrahepatic bile duct dilatation with abrupt change in caliber at the mid common bile duct. This could indicate an obstructing mass or stricture.    06/10/2018 Initial Biopsy   Diagnosis PANCREAS, FINE NEEDLE ASPIRATION (SPECIMEN 1 OF 1 COLLECTED  06/10/18): MALIGNANT CELLS CONSISTENT WITH ADENOCARCINOMA.   06/10/2018 Procedure   IMPRESSION: 1. 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 2. Placement of a metallic biliary stent.   06/10/2018 Procedure   EUS per Dr. Paulita Fujita Impression:  - There was dilation in the common bile duct which measured up to 12 mm. - A mass was identified in the pancreatic head. This was staged T3 N1 Mx by endosonographic criteria. Fine needle aspiration performed. - A few lymph nodes were visualized and measured in the peripancreatic region. - There was no evidence of significant pathology in the left lobe of the liver.   06/10/2018 Cancer Staging   Staging form: Exocrine Pancreas, AJCC 8th Edition - Clinical stage from 06/10/2018: Stage IB (cT2, cN0, cM0) - Signed by [name], MD on 06/29/2018   06/27/2018 Initial Diagnosis   Adenocarcinoma of pancreas ([place])   07/02/2018 Imaging   IMPRESSION: 1. Multiple small pulmonary nodules scattered throughout the lungs measuring up to 7 mm in size. These are nonspecific, but the possibility of metastatic disease should be considered, and close attention at time of routine followups is recommended. 2. In addition, today's study demonstrates new and enlarging low-attenuation lesions in the liver. This is poorly evaluated on today's noncontrast CT examination, but is concerning for potential metastatic disease. Further evaluation with repeat nonemergent MRI of the abdomen with and without IV gadolinium is suggested in the near future to better evaluate these findings. 3. Aortic atherosclerosis, in addition to left main and 2 vessel coronary artery disease. Please note that although the presence of coronary artery calcium documents the presence of coronary artery disease, the severity  of this disease and any potential stenosis cannot be assessed on this non-gated CT examination. Assessment for potential risk  factor modification, dietary therapy or pharmacologic therapy may be warranted, if clinically indicated. 4. Mild aneurysmal dilatation of the ascending thoracic aorta (4.8 cm in diameter). Ascending thoracic aortic aneurysm. Recommend semi-annual imaging followup by CTA or MRA and referral to cardiothoracic surgery if not already obtained. This recommendation follows 2010 ACCF/AHA/AATS/ACR/ASA/SCA/SCAI/SIR/STS/SVM Guidelines for the Diagnosis and Management of Patients With Thoracic Aortic Disease. Circulation. 2010; 121: S505-L976.  Aortic Atherosclerosis (ICD10-I70.0). Aortic aneurysm NOS (ICD10-I71.9).   07/05/2018 - 10/29/2018 Chemotherapy    FOLFIRINOX q2 weeks   07/15/2018 Imaging   07/15/2018 Liver US IMPRESSION: No liver lesions identified with ultrasound. Ultrasound-guided biopsy was not performed. Recommend further characterization for liver lesions with a repeat MRI, with and without contrast.   07/24/2018 Imaging   07/24/2018 MRI Abdomen IMPRESSION: 1. The hypoenhancing mass at the junction of the pancreatic body and head has reduced in size, previously 3.2 by 2.8 cm and currently 2.9 by 2.2 cm. A small peripancreatic lymph node adjacent to the mass was previously 0.9 cm in short axis and is currently 0.7 cm in short axis. The amount of contact between the pancreatic mass and the confluence of the splenic vein and SMV is similar to the prior exam. 2. There is a 6 mm probable hemangioma in segment 4a of the liver, based on the delayed enhancement pattern. This is somewhat ill-defined. The lesion appeared larger on the prior noncontrast CT but presumably may have been overestimated on that exam. There is also some hypodensity along the dome of the right hepatic lobe which appears to most likely be due to a slip of the diaphragm rather than a discernible lesion on MRI. 3. Aortic Atherosclerosis (ICD10-I70.0). Notably, there is a small amount of mural thrombus along  the right side of the abdominal aorta below the right renal artery level which has not been seen previously.   09/03/2018 Imaging   09/03/2018 MRI Abdomen IMPRESSION: 1. Stable mass (adenocarcinoma) in the neck of the pancreas with upstream duct dilatation. 2. No evidence of lymphadenopathy in the porta hepatis or peripancreatic fat. 3. No evidence hepatic metastasis. 4. Mild biliary duct dilatation LEFT hepatic lobe similar to comparison exam. Biliary stent within the common bile duct.   09/16/2018 Imaging   MR MRA CHEST W WO CONTRAST  IMPRESSION: VASCULAR  1. 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 2. Left subclavian port catheter to the SVC. 3. SVC is patent     10/24/2018 Imaging   MRI abdomen  IMPRESSION: 1. Slight interval decrease in size of the pancreatic mass. 2. Stable to slightly smaller adjacent lymph nodes. 3. No findings for metastatic disease involving the liver or lung bases. 4. Common bile duct stent in good position without complicating features.   11/10/2018 Surgery   PAC removal, Laparoscopic Cholecystectomy and Diagnostic Laparoscopic Liver Biopsy by Dre. Byerly  and Dr. Lucia Gaskins  11/10/18    11/10/2018 Pathology Results   Diagnosis 11/10/18 1. Liver, biopsy, Left - ADENOCARCINOMA. - SEE COMMENT. 2. Gallbladder - CHRONIC CHOLECYSTITIS WITH CHOLELITHIASIS. - ONE MORPHOLOGICALLY BENIGN LYMPH NODE.   12/10/2018 Imaging   CT AP  IMPRESSION: 1. Interval resolution of gallbladder fossa abscess. The percutaneous drainage catheter remains in good position. 2. Patent metallic biliary stent with expected pneumobilia. 3. Decreasing size of low-attenuation in hepatic segment 5 adjacent to the gallbladder fossa likely representing a small  resolving intrahepatic abscess. 4. Additional ancillary findings as above without significant interval change.   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  The CustomNext-Expanded gene panel offered by [facility] and includes sequencing and rearrangement analysis for the following 81 genes: AIP, ALK, APC*, ATM*, AXIN2, BAP1, BARD1, BLM, BMPR1A, BRCA1*, BRCA2*, BRIP1*, CDC73, CDH1*, CDK4, CDKN1B, CDKN2A, CHEK2*, CTNNA1, DICER1, FANCC, FH, FLCN, GALNT12, HOXB13, KIT, MAX, MEN1, MET, MLH1*, MRE11A, MSH2*, MSH6*, MUTYH*, NBN, NF1*, NF2, NTHL1, PALB2*, PDGFRA, PHOX2B, PMS2*, POLD1, POLE, POT1, PRKAR1A, PTCH1, PTEN*, RAD50, RAD51C*, RAD51D*, RB1, RET, SDHA, SDHAF2, SDHB, SDHC, SDHD, SMAD4, SMARCA4, SMARCB1, SMARCE1, STK11, SUFU, TMEM127, TP53*, TSC1, TSC2, VHL and XRCC2 (sequencing and deletion/duplication); CASR, CFTR, CPA1, CTRC, EGFR, MITF, PRSS1 and SPINK1 (sequencing only); EPCAM and GREM1 (deletion/duplication only). DNA and RNA analyses performed for * genes. The report date is Dec 19, 2018.   12/22/2018 -  Chemotherapy   second-line chemo with Gemcitabine and Abraxane 2-3 weeks on/1 week off starting 12/22/18 which single agent gemcitabine for cycle 1. Added Abraxane with cycle C1D8.     12/26/2018 Imaging   CT AP IMPRESSION: 1. No evidence of bowel obstruction. 2. New small amount of ascites with increased nodular soft tissue stranding throughout the omental fat. This could be inflammatory and indicate peritonitis. Early peritoneal carcinomatosis would be another explanation. 3. Patent biliary stent and persistent pneumobilia. No residual fluid collection identified in the cholecystectomy bed following percutaneous drain removal.   01/08/2019 Imaging   CT Chest 01/08/19 IMPRESSION: 1. Three small (less than 5 mm) pulmonary nodules in the RIGHT lower lobe are not identified on comparison CT from 07/02/2018 and therefore concerning for metastatic nodules.   2. Mild aneurysmal dilatation of the ascending thoracic aorta. Recommend annual imaging followup by CTA or MRA. This  recommendation follows 2010 ACCF/AHA/AATS/ACR/ASA/SCA/SCAI/SIR/STS/SVM Guidelines for the Diagnosis and Management of Patients with Thoracic Aortic Disease. Circulation. 2010; 121: K354-S568. Aortic aneurysm NOS (ICD10-I71.9)      CURRENT THERAPY:  second-line chemo with Gemcitabine and Abraxane 2 weeks on/1 week offstarting 12/22/18 with single agent Gemcitabine for first cycle. Added Abraxane with C1D8 on 12/30/18.   INTERVAL HISTORY:  Ernest Wu is here for a follow up and treatment. He presents to the clinic alone. His wife was called to be included in the visit today. He notes he is improving daily. He notes he feels drained and feeling gassy and uncomfortable but has small BMs daily. He denies constipated but has bowel cramps. He also feels bloated. He takes tylenol for this abdominal pain. He has oxycodone but has not used it.     REVIEW OF SYSTEMS:   Constitutional: Denies fevers, chills or abnormal weight loss (+) fatigue  Eyes: Denies blurriness of vision Ears, nose, mouth, throat, and face: Denies mucositis or sore throat Respiratory: Denies cough, dyspnea or wheezes Cardiovascular: Denies palpitation, chest discomfort or lower extremity swelling Gastrointestinal:  Denies nausea, heartburn (+) Gas, boating and bowel cramps of lateral abdomen Skin: Denies abnormal skin rashes Lymphatics: Denies new lymphadenopathy or easy bruising Neurological:Denies numbness, tingling or new weaknesses Behavioral/Psych: Mood is stable, no new changes  All other systems were reviewed with the patient and are negative.  MEDICAL HISTORY:  Past Medical History:  Diagnosis Date   Anxiety    Cancer ([place]) 06/2018   Pancreatic   Family history of breast cancer    Family history of colonic polyps    Family history of pancreatic cancer  Family history of prostate cancer    GERD (gastroesophageal reflux disease)     SURGICAL HISTORY: Past Surgical History:  Procedure  Laterality Date   BILIARY STENT PLACEMENT  06/10/2018   Procedure: BILIARY STENT PLACEMENT;  Surgeon: Ronnette Juniper, MD;  Location: [facility] ENDOSCOPY;  Service: Gastroenterology;;   CHOLECYSTECTOMY N/A 11/10/2018   Procedure: Laparoscopic Cholecystectomy;  Surgeon: Stark Klein, MD;  Location: [place];  Service: General;  Laterality: N/A;   DIAGNOSTIC LAPAROSCOPIC LIVER BIOPSY N/A 11/10/2018   Procedure: Diagnostic Laparoscopic Liver Biopsy;  Surgeon: Stark Klein, MD;  Location: [place];  Service: General;  Laterality: N/A;   ERCP N/A 06/10/2018   Procedure: ENDOSCOPIC RETROGRADE CHOLANGIOPANCREATOGRAPHY (ERCP);  Surgeon: Ronnette Juniper, MD;  Location: [place];  Service: Gastroenterology;  Laterality: N/A;   ESOPHAGOGASTRODUODENOSCOPY (EGD) WITH PROPOFOL N/A 06/10/2018   Procedure: ESOPHAGOGASTRODUODENOSCOPY (EGD) WITH PROPOFOL;  Surgeon: Ronnette Juniper, MD;  Location: [place];  Service: Gastroenterology;  Laterality: N/A;   FINE NEEDLE ASPIRATION  06/10/2018   Procedure: FINE NEEDLE ASPIRATION (FNA) LINEAR;  Surgeon: Ronnette Juniper, MD;  Location[facility] ENDOSCOPY;  Service: Gastroenterology;;   IR RADIOLOGIST EVAL & MGMT  12/10/2018   PORT-A-CATH REMOVAL N/A 11/10/2018   Procedure: REMOVAL PORT-A-CATH;  Surgeon: Stark Klein, MD;  Location: [place];  Service: General;  Laterality: N/A;   PORTACATH PLACEMENT N/A 07/02/2018   Procedure: INSERTION PORT-A-CATH;  Surgeon: Stark Klein, MD;  Location: [place];  Service: General;  Laterality: N/A;   SPHINCTEROTOMY  06/10/2018   Procedure: SPHINCTEROTOMY;  Surgeon: Ronnette Juniper, MD;  Location[facility] ENDOSCOPY;  Service: Gastroenterology;;   UPPER ESOPHAGEAL ENDOSCOPIC ULTRASOUND (EUS) N/A 06/10/2018   Procedure: UPPER ESOPHAGEAL ENDOSCOPIC ULTRASOUND (EUS);  Surgeon: Ronnette Juniper, MD;  Location: [place];  Service: Gastroenterology;  Laterality: N/A;   VASECTOMY      I have reviewed the social history and family history with the patient and they are unchanged  from previous note.  ALLERGIES:  is allergic to contrast media [iodinated diagnostic agents] and iohexol.  MEDICATIONS:  Current Outpatient Medications  Medication Sig Dispense Refill   docusate sodium (COLACE) 100 MG capsule Take 200 mg by mouth daily as needed for mild constipation.     esomeprazole (NEXIUM) 20 MG capsule Take 20 mg by mouth daily after breakfast.      folic acid (FOLVITE) 1 MG tablet Take 1 tablet (1 mg total) by mouth daily after breakfast. 30 tablet 3   magic mouthwash w/lidocaine SOLN Take 5 mLs by mouth 4 (four) times daily as needed for mouth pain. 240 mL 2   ondansetron (ZOFRAN) 8 MG tablet Take 1 tablet (8 mg total) by mouth every 8 (eight) hours as needed for nausea or vomiting. 30 tablet 1   polyethylene glycol (MIRALAX / GLYCOLAX) 17 g packet Take 17 g by mouth 2 (two) times daily.     prochlorperazine (COMPAZINE) 10 MG tablet Take 1 tablet (10 mg total) by mouth every 6 (six) hours as needed (NAUSEA). 30 tablet 1   rivaroxaban (XARELTO) 20 MG TABS tablet Take 1 tablet (20 mg total) by mouth daily after breakfast. 30 tablet 2   simethicone (MYLICON) 970 MG chewable tablet Chew 250 mg by mouth every 6 (six) hours as needed for flatulence.      Syringe, Disposable, (10-12CC SYRINGE) 12 ML MISC 10 mLs by Does not apply route as needed. 1 each 1   diphenoxylate-atropine (LOMOTIL) 2.5-0.025 MG tablet Take 2 tablets by mouth 4 (four) times daily as needed for  diarrhea or loose stools. (Patient not taking: Reported on 12/26/2018) 40 tablet 0   No current facility-administered medications for this visit.    Facility-Administered Medications Ordered in Other Visits  Medication Dose Route Frequency Provider Last Rate Last Dose   0.9 %  sodium chloride infusion   Intravenous Once [name], MD        PHYSICAL EXAMINATION: ECOG PERFORMANCE STATUS: 1 - Symptomatic but completely ambulatory  Vitals:   01/19/19 1406  BP: 133/79  Pulse: 64  Resp: 18  Temp:  99 F (37.2 C)  SpO2: 100%   Filed Weights   01/19/19 1406  Weight: 188 lb 6.4 oz (85.5 kg)    GENERAL:alert, no distress and comfortable SKIN: skin color, texture, turgor are normal, no rashes or significant lesions EYES: normal, Conjunctiva are pink and non-injected, sclera clear  NECK: supple, thyroid normal size, non-tender, without nodularity LYMPH:  no palpable lymphadenopathy in the cervical, axillary  LUNGS: clear to auscultation and percussion with normal breathing effort HEART: regular rate & rhythm and no murmurs and no lower extremity edema ABDOMEN:abdomen soft, non-tender and normal bowel sounds (+) hepatomegaly 5-6cm below ribcage Musculoskeletal:no cyanosis of digits and no clubbing  NEURO: alert & oriented x 3 with fluent speech, no focal motor/sensory deficits  LABORATORY DATA:  I have reviewed the data as listed CBC Latest Ref Rng & Units 01/19/2019 01/05/2019 12/30/2018  WBC 4.0 - 10.5 K/uL 5.0 2.8(L) 5.4  Hemoglobin 13.0 - 17.0 g/dL 11.2(L) 11.0(L) 11.7(L)  Hematocrit 39.0 - 52.0 % 35.0(L) 34.4(L) 35.9(L)  Platelets 150 - 400 K/uL 476(H) 138(L) 121(L)     CMP Latest Ref Rng & Units 01/19/2019 01/05/2019 12/30/2018  Glucose 70 - 99 mg/dL 111(H) 134(H) 103(H)  BUN 6 - 20 mg/dL 8 8 6   Creatinine 0.61 - 1.24 mg/dL 0.74 0.70 0.72  Sodium 135 - 145 mmol/L 141 140 138  Potassium 3.5 - 5.1 mmol/L 4.2 3.9 3.9  Chloride 98 - 111 mmol/L 104 105 104  CO2 22 - 32 mmol/L 27 27 26   Calcium 8.9 - 10.3 mg/dL 9.1 9.1 9.1  Total Protein 6.5 - 8.1 g/dL 6.9 6.7 6.8  Total Bilirubin 0.3 - 1.2 mg/dL <0.2(L) <0.2(L) 0.3  Alkaline Phos 38 - 126 U/L 101 95 107  AST 15 - 41 U/L 17 29 32  ALT 0 - 44 U/L 26 60(H) 90(H)      RADIOGRAPHIC STUDIES: I have personally reviewed the radiological images as listed and agreed with the findings in the report. No results found.   ASSESSMENT & PLAN:  Ernest Wu is a 58 y.o. male with   1. Adenocarcinoma of the pancreas,cT3N1M0,  borderline resectable, liver metastasis in 11/2018 -Diagnosed in 06/2018. Treated with neoadjuvant chemoFOLFIRINOX. -He underwent Cholecystectomy and liver biopsy on 11/10/18 with Dr. Miles Costain Whipple surgery wasaborted due to new liver mets found during surgery.Given metastasis disease his cancer is no longer curable but still treatable.  -Unfortunately after his surgery he developed anabscessat surgical sitewhich caused sepsis secondary to GNR bacteremia. He was treated with antibiotics and temporary JP drain of abscess.He has recovered well.  -He started second-line chemo with Gemcitabine and Abraxane on 12/22/18. He started cycle 1 with single agent gemcitabine given reluctance to continue cancer treatment. Abraxane was added C1D8.  -His recent CT AP showed new small ascites and increased nodular soft tissue in omentum which is suspicious of peritoneal metastasis. -Today we discussed his CT chest from 01/09/19 which shows very small lung nodules which  are indeterminate. I recommend monitoring -He has abdominal gas, bloating and bowel cramps which are likely related to suspicious peritoneal metastasis.  -Labs reviewed, CBC and CMP WNL except Hg 11.2, PLT 476K, BG 111, Ca 19.9. Overall adeqaute to proceed with Gem/Abraxane 2 weeks on/1 week off. 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  -Heis onXarelto20 mg daily now, continueindefinitely   4. Constipation, Gas, bloating and bowel cramps -Constipation secondary to  chemo and premedications. He takes [employer], dispensing as needed -Recent Gas, bloating and bowel cramps are related to his recent peritoneal metastasis which was suspected on recent scan.  -He currenlty does not have constipation.  -I encouraged him to take OTC gas-x. He can continue Tylneol for current pain. He has oxycodone which he can use for significant persistent pain.    5.Sepsis secondary toHafniabacteriemia, s/p JP drain placement  -treated with antibiotics and draining ofabdominalabscess. -drain removed per IR on 12/10/18, drain site has healed well, repeated CT on 5/22 showed no re residual fluids   6. Goal of care discussion  -We previously discussed the incurable nature of his cancer, and the overall poor prognosis, especially if he does not have good response to chemotherapy or progress on chemo -The patient understands the goal of care is palliative. -he is full code now    Plan -Labs reviewed and adequate to proceed with C2D1 gemcitabine and Abraxane today with full dose  -Lab, f/u and Gem/abraxane in 1 week  -I called his wife during his visit today and answered her questions    No problem-specific Assessment & Plan notes found for this encounter.   No orders of the defined types were placed in this encounter.  All questions were answered. The patient knows to call the clinic with any problems, questions or concerns. No barriers to learning was detected. I spent 20 minutes counseling the patient face to face. The total time spent in the appointment was 25 minutes and more than 50% was on counseling and review of test results     [name], MD 01/19/2019   I, Joslyn Devon, am acting as scribe for [name], MD.   I have reviewed the above documentation for accuracy and completeness, and I agree with the above.

## 2019-01-15 NOTE — Telephone Encounter (Signed)
Faxed medical records to [employer] [facility] [employer]. Release PJ#[id number]

## 2019-01-19 ENCOUNTER — Inpatient Hospital Stay: Payer: Managed Care, Other (non HMO)

## 2019-01-19 ENCOUNTER — Other Ambulatory Visit: Payer: Self-pay

## 2019-01-19 ENCOUNTER — Encounter: Payer: Self-pay | Admitting: Hematology

## 2019-01-19 ENCOUNTER — Inpatient Hospital Stay (HOSPITAL_BASED_OUTPATIENT_CLINIC_OR_DEPARTMENT_OTHER): Payer: Managed Care, Other (non HMO) | Admitting: Hematology

## 2019-01-19 VITALS — BP 133/79 | HR 64 | Temp 99.0°F | Resp 18 | Ht 72.0 in | Wt 188.4 lb

## 2019-01-19 DIAGNOSIS — F17210 Nicotine dependence, cigarettes, uncomplicated: Secondary | ICD-10-CM

## 2019-01-19 DIAGNOSIS — C787 Secondary malignant neoplasm of liver and intrahepatic bile duct: Secondary | ICD-10-CM

## 2019-01-19 DIAGNOSIS — Z79899 Other long term (current) drug therapy: Secondary | ICD-10-CM

## 2019-01-19 DIAGNOSIS — Z7901 Long term (current) use of anticoagulants: Secondary | ICD-10-CM

## 2019-01-19 DIAGNOSIS — K5900 Constipation, unspecified: Secondary | ICD-10-CM | POA: Diagnosis not present

## 2019-01-19 DIAGNOSIS — C259 Malignant neoplasm of pancreas, unspecified: Secondary | ICD-10-CM

## 2019-01-19 DIAGNOSIS — R918 Other nonspecific abnormal finding of lung field: Secondary | ICD-10-CM | POA: Diagnosis not present

## 2019-01-19 DIAGNOSIS — R140 Abdominal distension (gaseous): Secondary | ICD-10-CM

## 2019-01-19 DIAGNOSIS — I700 Atherosclerosis of aorta: Secondary | ICD-10-CM

## 2019-01-19 DIAGNOSIS — C257 Malignant neoplasm of other parts of pancreas: Secondary | ICD-10-CM | POA: Diagnosis not present

## 2019-01-19 DIAGNOSIS — Z86718 Personal history of other venous thrombosis and embolism: Secondary | ICD-10-CM

## 2019-01-19 LAB — CMP (CANCER CENTER ONLY)
ALT: 26 U/L (ref 0–44)
AST: 17 U/L (ref 15–41)
Albumin: 3.5 g/dL (ref 3.5–5.0)
Alkaline Phosphatase: 101 U/L (ref 38–126)
Anion gap: 10 (ref 5–15)
BUN: 8 mg/dL (ref 6–20)
CO2: 27 mmol/L (ref 22–32)
Calcium: 9.1 mg/dL (ref 8.9–10.3)
Chloride: 104 mmol/L (ref 98–111)
Creatinine: 0.74 mg/dL (ref 0.61–1.24)
GFR, Est AFR Am: >60 mL/min (ref >60)
GFR, Estimated: >60 mL/min (ref >60)
Glucose, Bld: 111 mg/dL — ABNORMAL HIGH (ref 70–99)
Potassium: 4.2 mmol/L (ref 3.5–5.1)
Sodium: 141 mmol/L (ref 135–145)
Total Bilirubin: <0.2 mg/dL — ABNORMAL LOW (ref 0.3–1.2)
Total Protein: 6.9 g/dL (ref 6.5–8.1)

## 2019-01-19 LAB — CBC WITH DIFFERENTIAL (CANCER CENTER ONLY)
Abs Immature Granulocytes: 0.02 10*3/uL (ref 0.00–0.07)
Basophils Absolute: 0 10*3/uL (ref 0.0–0.1)
Basophils Relative: 1 %
Eosinophils Absolute: 0.1 10*3/uL (ref 0.0–0.5)
Eosinophils Relative: 1 %
HCT: 35 % — ABNORMAL LOW (ref 39.0–52.0)
Hemoglobin: 11.2 g/dL — ABNORMAL LOW (ref 13.0–17.0)
Immature Granulocytes: 0 %
Lymphocytes Relative: 18 %
Lymphs Abs: 0.9 10*3/uL (ref 0.7–4.0)
MCH: 29.2 pg (ref 26.0–34.0)
MCHC: 32 g/dL (ref 30.0–36.0)
MCV: 91.1 fL (ref 80.0–100.0)
Monocytes Absolute: 0.6 10*3/uL (ref 0.1–1.0)
Monocytes Relative: 12 %
Neutro Abs: 3.4 10*3/uL (ref 1.7–7.7)
Neutrophils Relative %: 68 %
Platelet Count: 476 10*3/uL — ABNORMAL HIGH (ref 150–400)
RBC: 3.84 MIL/uL — ABNORMAL LOW (ref 4.22–5.81)
RDW: 16.2 % — ABNORMAL HIGH (ref 11.5–15.5)
WBC Count: 5 10*3/uL (ref 4.0–10.5)
nRBC: 0 % (ref 0.0–0.2)

## 2019-01-19 MED ORDER — PROCHLORPERAZINE MALEATE 10 MG PO TABS: 10.0000 mg | ORAL_TABLET | Freq: Four times a day (QID) | ORAL | 1 refills | Status: DC | PRN

## 2019-01-19 MED ORDER — PROCHLORPERAZINE MALEATE 10 MG PO TABS
ORAL_TABLET | ORAL | Status: AC
  Filled 2019-01-19: qty 1

## 2019-01-19 MED ORDER — PROCHLORPERAZINE MALEATE 10 MG PO TABS
10.0000 mg | ORAL_TABLET | Freq: Once | ORAL | Status: AC
  Administered 2019-01-19: 10 mg via ORAL

## 2019-01-19 MED ORDER — SODIUM CHLORIDE 0.9 % IV SOLN
Freq: Once | INTRAVENOUS | Status: AC
  Administered 2019-01-19: 15:00:00 via INTRAVENOUS
  Filled 2019-01-19: qty 250

## 2019-01-19 MED ORDER — SODIUM CHLORIDE 0.9 % IV SOLN
2000.0000 mg | Freq: Once | INTRAVENOUS | Status: AC
  Administered 2019-01-19: 2000 mg via INTRAVENOUS
  Filled 2019-01-19: qty 52.6

## 2019-01-19 MED ORDER — SODIUM CHLORIDE 0.9 % IV SOLN
Freq: Once | INTRAVENOUS | Status: DC
  Filled 2019-01-19: qty 250

## 2019-01-19 MED ORDER — PACLITAXEL PROTEIN-BOUND CHEMO INJECTION 100 MG
125.0000 mg/m2 | Freq: Once | INTRAVENOUS | Status: AC
  Administered 2019-01-19: 16:00:00 275 mg via INTRAVENOUS
  Filled 2019-01-19: qty 55

## 2019-01-19 NOTE — Patient Instructions (Signed)
[facility] Discharge Instructions for Patients Receiving Chemotherapy  Today you received the following chemotherapy agents: Abraxane and Gemzar.  To help prevent nausea and vomiting after your treatment, we encourage you to take your nausea medication: As directed by your MD   If you develop nausea and vomiting that is not controlled by your nausea medication, call the clinic.   BELOW ARE SYMPTOMS THAT SHOULD BE REPORTED IMMEDIATELY:  *FEVER GREATER THAN 100.5 F  *CHILLS WITH OR WITHOUT FEVER  NAUSEA AND VOMITING THAT IS NOT CONTROLLED WITH YOUR NAUSEA MEDICATION  *UNUSUAL SHORTNESS OF BREATH  *UNUSUAL BRUISING OR BLEEDING  TENDERNESS IN MOUTH AND THROAT WITH OR WITHOUT PRESENCE OF ULCERS  *URINARY PROBLEMS  *BOWEL PROBLEMS  UNUSUAL RASH Items with * indicate a potential emergency and should be followed up as soon as possible.  Feel free to call the clinic should you have any questions or concerns. The clinic phone number is [phone].  Please show the [facility] at check-in to the Emergency Department and triage nurse.   Gemcitabine injection What is this medicine? GEMCITABINE (jem SYE ta been) is a chemotherapy drug. This medicine is used to treat many types of cancer like breast cancer, lung cancer, pancreatic cancer, and ovarian cancer. This medicine may be used for other purposes; ask your health care provider or pharmacist if you have questions. COMMON BRAND NAME(S): Gemzar, Infugem What should I tell my health care provider before I take this medicine? They need to know if you have any of these conditions: -blood disorders -infection -kidney disease -liver disease -lung or breathing disease, like asthma -recent or ongoing radiation therapy -an unusual or allergic reaction to gemcitabine, other chemotherapy, other medicines, foods, dyes, or preservatives -pregnant or trying to get pregnant -breast-feeding How should I use this  medicine? This drug is given as an infusion into a vein. It is administered in a hospital or clinic by a specially trained health care professional. Talk to your pediatrician regarding the use of this medicine in children. Special care may be needed. Overdosage: If you think you have taken too much of this medicine contact a poison control center or emergency room at once. NOTE: This medicine is only for you. Do not share this medicine with others. What if I miss a dose? It is important not to miss your dose. Call your doctor or health care professional if you are unable to keep an appointment. What may interact with this medicine? -medicines to increase blood counts like filgrastim, pegfilgrastim, sargramostim -some other chemotherapy drugs like cisplatin -vaccines Talk to your doctor or health care professional before taking any of these medicines: -acetaminophen -aspirin -ibuprofen -ketoprofen -naproxen This list may not describe all possible interactions. Give your health care provider a list of all the medicines, herbs, non-prescription drugs, or dietary supplements you use. Also tell them if you smoke, drink alcohol, or use illegal drugs. Some items may interact with your medicine. What should I watch for while using this medicine? Visit your doctor for checks on your progress. This drug may make you feel generally unwell. This is not uncommon, as chemotherapy can affect healthy cells as well as cancer cells. Report any side effects. Continue your course of treatment even though you feel ill unless your doctor tells you to stop. In some cases, you may be given additional medicines to help with side effects. Follow all directions for their use. Call your doctor or health care professional for advice if you get  a fever, chills or sore throat, or other symptoms of a cold or flu. Do not treat yourself. This drug decreases your body's ability to fight infections. Try to avoid being around  people who are sick. This medicine may increase your risk to bruise or bleed. Call your doctor or health care professional if you notice any unusual bleeding. Be careful brushing and flossing your teeth or using a toothpick because you may get an infection or bleed more easily. If you have any dental work done, tell your dentist you are receiving this medicine. Avoid taking products that contain aspirin, acetaminophen, ibuprofen, naproxen, or ketoprofen unless instructed by your doctor. These medicines may hide a fever. Do not become pregnant while taking this medicine or for 6 months after stopping it. Women should inform their doctor if they wish to become pregnant or think they might be pregnant. Men should not father a child while taking this medicine and for 3 months after stopping it. There is a potential for serious side effects to an unborn child. Talk to your health care professional or pharmacist for more information. Do not breast-feed an infant while taking this medicine or for at least 1 week after stopping it. Men should inform their doctors if they wish to father a child. This medicine may lower sperm counts. Talk with your doctor or health care professional if you are concerned about your fertility. What side effects may I notice from receiving this medicine? Side effects that you should report to your doctor or health care professional as soon as possible: -allergic reactions like skin rash, itching or hives, swelling of the face, lips, or tongue -breathing problems -pain, redness, or irritation at site where injected -signs and symptoms of a dangerous change in heartbeat or heart rhythm like chest pain; dizziness; fast or irregular heartbeat; palpitations; feeling faint or lightheaded, falls; breathing problems -signs of decreased platelets or bleeding - bruising, pinpoint red spots on the skin, black, tarry stools, blood in the urine -signs of decreased red blood cells - unusually  weak or tired, feeling faint or lightheaded, falls -signs of infection - fever or chills, cough, sore throat, pain or difficulty passing urine -signs and symptoms of kidney injury like trouble passing urine or change in the amount of urine -signs and symptoms of liver injury like dark yellow or brown urine; general ill feeling or flu-like symptoms; light-colored stools; loss of appetite; nausea; right upper belly pain; unusually weak or tired; yellowing of the eyes or skin -swelling of ankles, feet, hands Side effects that usually do not require medical attention (report to your doctor or health care professional if they continue or are bothersome): -constipation -diarrhea -hair loss -loss of appetite -nausea -rash -vomiting This list may not describe all possible side effects. Call your doctor for medical advice about side effects. You may report side effects to FDA at 1-800-FDA-1088. Where should I keep my medicine? This drug is given in a hospital or clinic and will not be stored at home. NOTE: This sheet is a summary. It may not cover all possible information. If you have questions about this medicine, talk to your doctor, pharmacist, or health care provider.  2019 Elsevier/Gold Standard (2017-10-16 18:06:11)  Coronavirus (COVID-19) Are you at risk?  Nanoparticle Albumin-Bound Paclitaxel injection What is this medicine? NANOPARTICLE ALBUMIN-BOUND PACLITAXEL (Na no PAHR ti kuhl al BYOO muhn-bound PAK li TAX el) is a chemotherapy drug. It targets fast dividing cells, like cancer cells, and causes these cells to die.  This medicine is used to treat advanced breast cancer, lung cancer, and pancreatic cancer. This medicine may be used for other purposes; ask your health care provider or pharmacist if you have questions. COMMON BRAND NAME(S): Abraxane What should I tell my health care provider before I take this medicine? They need to know if you have any of these conditions: -kidney  disease -liver disease -low blood counts, like low white cell, platelet, or red cell counts -lung or breathing disease, like asthma -tingling of the fingers or toes, or other nerve disorder -an unusual or allergic reaction to paclitaxel, albumin, other chemotherapy, other medicines, foods, dyes, or preservatives -pregnant or trying to get pregnant -breast-feeding How should I use this medicine? This drug is given as an infusion into a vein. It is administered in a hospital or clinic by a specially trained health care professional. Talk to your pediatrician regarding the use of this medicine in children. Special care may be needed. Overdosage: If you think you have taken too much of this medicine contact a poison control center or emergency room at once. NOTE: This medicine is only for you. Do not share this medicine with others. What if I miss a dose? It is important not to miss your dose. Call your doctor or health care professional if you are unable to keep an appointment. What may interact with this medicine? This medicine may interact with the following medications: -antiviral medicines for hepatitis, HIV or AIDS -certain antibiotics like erythromycin and clarithromycin -certain medicines for fungal infections like ketoconazole and itraconazole -certain medicines for seizures like carbamazepine, phenobarbital, phenytoin -gemfibrozil -nefazodone -rifampin -St. John's wort This list may not describe all possible interactions. Give your health care provider a list of all the medicines, herbs, non-prescription drugs, or dietary supplements you use. Also tell them if you smoke, drink alcohol, or use illegal drugs. Some items may interact with your medicine. What should I watch for while using this medicine? Your condition will be monitored carefully while you are receiving this medicine. You will need important blood work done while you are taking this medicine. This medicine can cause  serious allergic reactions. If you experience allergic reactions like skin rash, itching or hives, swelling of the face, lips, or tongue, tell your doctor or health care professional right away. In some cases, you may be given additional medicines to help with side effects. Follow all directions for their use. This drug may make you feel generally unwell. This is not uncommon, as chemotherapy can affect healthy cells as well as cancer cells. Report any side effects. Continue your course of treatment even though you feel ill unless your doctor tells you to stop. Call your doctor or health care professional for advice if you get a fever, chills or sore throat, or other symptoms of a cold or flu. Do not treat yourself. This drug decreases your body's ability to fight infections. Try to avoid being around people who are sick. This medicine may increase your risk to bruise or bleed. Call your doctor or health care professional if you notice any unusual bleeding. Be careful brushing and flossing your teeth or using a toothpick because you may get an infection or bleed more easily. If you have any dental work done, tell your dentist you are receiving this medicine. Avoid taking products that contain aspirin, acetaminophen, ibuprofen, naproxen, or ketoprofen unless instructed by your doctor. These medicines may hide a fever. Do not become pregnant while taking this medicine or for 6 months  after stopping it. Women should inform their doctor if they wish to become pregnant or think they might be pregnant. Men should not father a child while taking this medicine or for 3 months after stopping it. There is a potential for serious side effects to an unborn child. Talk to your health care professional or pharmacist for more information. Do not breast-feed an infant while taking this medicine or for 2 weeks after stopping it. This medicine may interfere with the ability to get pregnant or to father a child. You should  talk to your doctor or health care professional if you are concerned about your fertility. What side effects may I notice from receiving this medicine? Side effects that you should report to your doctor or health care professional as soon as possible: -allergic reactions like skin rash, itching or hives, swelling of the face, lips, or tongue -breathing problems -changes in vision -fast, irregular heartbeat -low blood pressure -mouth sores -pain, tingling, numbness in the hands or feet -signs of decreased platelets or bleeding - bruising, pinpoint red spots on the skin, black, tarry stools, blood in the urine -signs of decreased red blood cells - unusually weak or tired, feeling faint or lightheaded, falls -signs of infection - fever or chills, cough, sore throat, pain or difficulty passing urine -signs and symptoms of liver injury like dark yellow or brown urine; general ill feeling or flu-like symptoms; light-colored stools; loss of appetite; nausea; right upper belly pain; unusually weak or tired; yellowing of the eyes or skin -swelling of the ankles, feet, hands -unusually slow heartbeat Side effects that usually do not require medical attention (report to your doctor or health care professional if they continue or are bothersome): -diarrhea -hair loss -loss of appetite -nausea, vomiting -tiredness This list may not describe all possible side effects. Call your doctor for medical advice about side effects. You may report side effects to FDA at 1-800-FDA-1088. Where should I keep my medicine? This drug is given in a hospital or clinic and will not be stored at home. NOTE: This sheet is a summary. It may not cover all possible information. If you have questions about this medicine, talk to your doctor, pharmacist, or health care provider.  2019 Elsevier/Gold Standard (2017-03-26 13:03:45)     Are you at risk for the Coronavirus (COVID-19)?  To be considered HIGH RISK for  Coronavirus (COVID-19), you have to meet the following criteria:  . Traveled to [place], [place], [place], [place] or [place]; or in the [place] to [place], [place], [place], or [place]; and have fever, cough, and shortness of breath within the last 2 weeks of travel OR . Been in close contact with a person diagnosed with COVID-19 within the last 2 weeks and have fever, cough, and shortness of breath . IF YOU DO NOT MEET THESE CRITERIA, YOU ARE CONSIDERED LOW RISK FOR COVID-19.  What to do if you are HIGH RISK for COVID-19?  Marland Kitchen If you are having a medical emergency, call 911. . Seek medical care right away. Before you go to a doctor's office, urgent care or emergency department, call ahead and tell them about your recent travel, contact with someone diagnosed with COVID-19, and your symptoms. You should receive instructions from your physician's office regarding next steps of care.  . When you arrive at healthcare provider, tell the healthcare staff immediately you have returned from visiting [place], [place], [place], [place] or [place]; or traveled in [place] to [place],  [place], [place], or [place]; in the last two weeks or you have been in close contact with a person diagnosed with COVID-19 in the last 2 weeks.   . Tell the health care staff about your symptoms: fever, cough and shortness of breath. . After you have been seen by a medical provider, you will be either: o Tested for (COVID-19) and discharged home on quarantine except to seek medical care if symptoms worsen, and asked to  - Stay home and avoid contact with others until you get your results (4-5 days)  - Avoid travel on public transportation if possible (such as bus, train, or airplane) or o Sent to the Emergency Department by EMS for evaluation, COVID-19 testing, and possible admission depending on your condition and test results.  What to do if you are LOW RISK for COVID-19?  Reduce your risk of any  infection by using the same precautions used for avoiding the common cold or flu:  Marland Kitchen Wash your hands often with soap and warm water for at least 20 seconds.  If soap and water are not readily available, use an alcohol-based hand sanitizer with at least 60% alcohol.  . If coughing or sneezing, cover your mouth and nose by coughing or sneezing into the elbow areas of your shirt or coat, into a tissue or into your sleeve (not your hands). . Avoid shaking hands with others and consider head nods or verbal greetings only. . Avoid touching your eyes, nose, or mouth with unwashed hands.  . Avoid close contact with people who are sick. . Avoid places or events with large numbers of people in one location, like concerts or sporting events. . Carefully consider travel plans you have or are making. . If you are planning any travel outside or inside the [name], visit the CDC's Travelers' Health webpage for the latest health notices. . If you have some symptoms but not all symptoms, continue to monitor at home and seek medical attention if your symptoms worsen. . If you are having a medical emergency, call 911.   [place] / e-Visit: [email or web address]         [facility]: [facility]: [phone]                   [facility]: [phone]

## 2019-01-19 NOTE — Progress Notes (Signed)
Spoke w/ Dr. Burr Medico, she would like pt to receive slightly increased doses of gemcitabine (1000 mg/m2) and abraxane (125 mg/m2) today. She will see him back next week and assess his tolerability of this at that time.   He is also changing his treatment schedule to 2 weeks on, 1 week off. D15 was deleted from plan to reflect this per MD.   Demetrius Charity, PharmD, [facility] Phone: [phone] 01/19/2019

## 2019-01-20 ENCOUNTER — Telehealth: Payer: Self-pay | Admitting: Hematology

## 2019-01-20 LAB — CANCER ANTIGEN 19-9: CA 19-9: 82 U/mL — ABNORMAL HIGH (ref 0–35)

## 2019-01-20 NOTE — Telephone Encounter (Signed)
No los per 6/15.

## 2019-01-23 NOTE — Progress Notes (Signed)
Ernest Wu   [phone] [phone]   Clinic Follow up Note   Patient Care Team: Street, [name], MD as PCP - General (Family Medicine)  Date of Service:  01/26/2019  CHIEF COMPLAINT: F/u pancreatic cancer  SUMMARY OF ONCOLOGIC HISTORY: Oncology History Overview Note  Cancer Staging Adenocarcinoma of pancreas [facility]) Staging form: Exocrine Pancreas, AJCC 8th Edition - Clinical stage from 06/10/2018: Stage IB (cT2, cN0, cM0) - Signed by [name], MD on 06/29/2018     Adenocarcinoma of pancreas ([place])  06/07/2018 Imaging   CT AP w Contrast IMPRESSION: 1. There is a low attenuation mass centered around the neck of pancreas which is concerning for neoplasm. Pancreatic adenocarcinoma favored. This results in common bile duct obstruction with mild intrahepatic biliary ductal dilatation. There also is involvement of the portal venous confluence. Further evaluation with nonemergent contrast enhanced MRI of the pancreas is recommended. 2. Small indeterminate low-attenuation structure is noted within segment 4 a of the liver. This could be better addressed at MRI.   06/09/2018 Imaging   MR ABD MRCP  The pancreatic mass involves approximately 40 percent of the main portal vein circumference at the portal splenic venous confluence, with associated mild narrowing of the portal splenic venous confluence. The SMV, splenic vein and main, right and left portal veins remain patent. The celiac trunk and SMA are not involved by the pancreatic mass.  IMPRESSION: 2.3 cm diameter hypoenhancing mass at the junction of the head and neck of pancreas with pancreatic ductal dilatation, likely adenocarcinoma.  Intra and extrahepatic bile duct dilatation with abrupt change in caliber at the mid common bile duct. This could indicate an obstructing mass or stricture.    06/10/2018 Initial Biopsy   Diagnosis PANCREAS, FINE NEEDLE ASPIRATION (SPECIMEN 1 OF 1 COLLECTED  06/10/18): MALIGNANT CELLS CONSISTENT WITH ADENOCARCINOMA.   06/10/2018 Procedure   IMPRESSION: 1. 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 2. Placement of a metallic biliary stent.   06/10/2018 Procedure   EUS per Dr. Paulita Fujita Impression:  - There was dilation in the common bile duct which measured up to 12 mm. - A mass was identified in the pancreatic head. This was staged T3 N1 Mx by endosonographic criteria. Fine needle aspiration performed. - A few lymph nodes were visualized and measured in the peripancreatic region. - There was no evidence of significant pathology in the left lobe of the liver.   06/10/2018 Cancer Staging   Staging form: Exocrine Pancreas, AJCC 8th Edition - Clinical stage from 06/10/2018: Stage IB (cT2, cN0, cM0) - Signed by [name], MD on 06/29/2018   06/27/2018 Initial Diagnosis   Adenocarcinoma of pancreas ([place])   07/02/2018 Imaging   IMPRESSION: 1. Multiple small pulmonary nodules scattered throughout the lungs measuring up to 7 mm in size. These are nonspecific, but the possibility of metastatic disease should be considered, and close attention at time of routine followups is recommended. 2. In addition, today's study demonstrates new and enlarging low-attenuation lesions in the liver. This is poorly evaluated on today's noncontrast CT examination, but is concerning for potential metastatic disease. Further evaluation with repeat nonemergent MRI of the abdomen with and without IV gadolinium is suggested in the near future to better evaluate these findings. 3. Aortic atherosclerosis, in addition to left main and 2 vessel coronary artery disease. Please note that although the presence of coronary artery calcium documents the presence of coronary artery disease, the severity  of this disease and any potential stenosis cannot be assessed on this non-gated CT examination. Assessment for potential risk  factor modification, dietary therapy or pharmacologic therapy may be warranted, if clinically indicated. 4. Mild aneurysmal dilatation of the ascending thoracic aorta (4.8 cm in diameter). Ascending thoracic aortic aneurysm. Recommend semi-annual imaging followup by CTA or MRA and referral to cardiothoracic surgery if not already obtained. This recommendation follows 2010 ACCF/AHA/AATS/ACR/ASA/SCA/SCAI/SIR/STS/SVM Guidelines for the Diagnosis and Management of Patients With Thoracic Aortic Disease. Circulation. 2010; 121: I347-Q259.  Aortic Atherosclerosis (ICD10-I70.0). Aortic aneurysm NOS (ICD10-I71.9).   07/05/2018 - 10/29/2018 Chemotherapy    FOLFIRINOX q2 weeks   07/15/2018 Imaging   07/15/2018 Liver US IMPRESSION: No liver lesions identified with ultrasound. Ultrasound-guided biopsy was not performed. Recommend further characterization for liver lesions with a repeat MRI, with and without contrast.   07/24/2018 Imaging   07/24/2018 MRI Abdomen IMPRESSION: 1. The hypoenhancing mass at the junction of the pancreatic body and head has reduced in size, previously 3.2 by 2.8 cm and currently 2.9 by 2.2 cm. A small peripancreatic lymph node adjacent to the mass was previously 0.9 cm in short axis and is currently 0.7 cm in short axis. The amount of contact between the pancreatic mass and the confluence of the splenic vein and SMV is similar to the prior exam. 2. There is a 6 mm probable hemangioma in segment 4a of the liver, based on the delayed enhancement pattern. This is somewhat ill-defined. The lesion appeared larger on the prior noncontrast CT but presumably may have been overestimated on that exam. There is also some hypodensity along the dome of the right hepatic lobe which appears to most likely be due to a slip of the diaphragm rather than a discernible lesion on MRI. 3. Aortic Atherosclerosis (ICD10-I70.0). Notably, there is a small amount of mural thrombus along  the right side of the abdominal aorta below the right renal artery level which has not been seen previously.   09/03/2018 Imaging   09/03/2018 MRI Abdomen IMPRESSION: 1. Stable mass (adenocarcinoma) in the neck of the pancreas with upstream duct dilatation. 2. No evidence of lymphadenopathy in the porta hepatis or peripancreatic fat. 3. No evidence hepatic metastasis. 4. Mild biliary duct dilatation LEFT hepatic lobe similar to comparison exam. Biliary stent within the common bile duct.   09/16/2018 Imaging   MR MRA CHEST W WO CONTRAST  IMPRESSION: VASCULAR  1. 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 2. Left subclavian port catheter to the SVC. 3. SVC is patent     10/24/2018 Imaging   MRI abdomen  IMPRESSION: 1. Slight interval decrease in size of the pancreatic mass. 2. Stable to slightly smaller adjacent lymph nodes. 3. No findings for metastatic disease involving the liver or lung bases. 4. Common bile duct stent in good position without complicating features.   11/10/2018 Surgery   PAC removal, Laparoscopic Cholecystectomy and Diagnostic Laparoscopic Liver Biopsy by Dre. Byerly  and Dr. Lucia Gaskins  11/10/18    11/10/2018 Pathology Results   Diagnosis 11/10/18 1. Liver, biopsy, Left - ADENOCARCINOMA. - SEE COMMENT. 2. Gallbladder - CHRONIC CHOLECYSTITIS WITH CHOLELITHIASIS. - ONE MORPHOLOGICALLY BENIGN LYMPH NODE.   12/10/2018 Imaging   CT AP  IMPRESSION: 1. Interval resolution of gallbladder fossa abscess. The percutaneous drainage catheter remains in good position. 2. Patent metallic biliary stent with expected pneumobilia. 3. Decreasing size of low-attenuation in hepatic segment 5 adjacent to the gallbladder fossa likely representing a small  resolving intrahepatic abscess. 4. Additional ancillary findings as above without significant interval change.   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  The CustomNext-Expanded gene panel offered by [facility] and includes sequencing and rearrangement analysis for the following 81 genes: AIP, ALK, APC*, ATM*, AXIN2, BAP1, BARD1, BLM, BMPR1A, BRCA1*, BRCA2*, BRIP1*, CDC73, CDH1*, CDK4, CDKN1B, CDKN2A, CHEK2*, CTNNA1, DICER1, FANCC, FH, FLCN, GALNT12, HOXB13, KIT, MAX, MEN1, MET, MLH1*, MRE11A, MSH2*, MSH6*, MUTYH*, NBN, NF1*, NF2, NTHL1, PALB2*, PDGFRA, PHOX2B, PMS2*, POLD1, POLE, POT1, PRKAR1A, PTCH1, PTEN*, RAD50, RAD51C*, RAD51D*, RB1, RET, SDHA, SDHAF2, SDHB, SDHC, SDHD, SMAD4, SMARCA4, SMARCB1, SMARCE1, STK11, SUFU, TMEM127, TP53*, TSC1, TSC2, VHL and XRCC2 (sequencing and deletion/duplication); CASR, CFTR, CPA1, CTRC, EGFR, MITF, PRSS1 and SPINK1 (sequencing only); EPCAM and GREM1 (deletion/duplication only). DNA and RNA analyses performed for * genes. The report date is Dec 19, 2018.   12/22/2018 -  Chemotherapy   second-line chemo with Gemcitabine and Abraxane 2-3 weeks on/1 week off starting 12/22/18 which single agent gemcitabine for cycle 1. Added Abraxane with cycle C1D8.     12/26/2018 Imaging   CT AP IMPRESSION: 1. No evidence of bowel obstruction. 2. New small amount of ascites with increased nodular soft tissue stranding throughout the omental fat. This could be inflammatory and indicate peritonitis. Early peritoneal carcinomatosis would be another explanation. 3. Patent biliary stent and persistent pneumobilia. No residual fluid collection identified in the cholecystectomy bed following percutaneous drain removal.   01/08/2019 Imaging   CT Chest 01/08/19 IMPRESSION: 1. Three small (less than 5 mm) pulmonary nodules in the RIGHT lower lobe are not identified on comparison CT from 07/02/2018 and therefore concerning for metastatic nodules.   2. Mild aneurysmal dilatation of the ascending thoracic aorta. Recommend annual imaging followup by CTA or MRA. This  recommendation follows 2010 ACCF/AHA/AATS/ACR/ASA/SCA/SCAI/SIR/STS/SVM Guidelines for the Diagnosis and Management of Patients with Thoracic Aortic Disease. Circulation. 2010; 121: K553-Z482. Aortic aneurysm NOS (ICD10-I71.9)      CURRENT THERAPY:  second-line chemo with Gemcitabine and Abraxane2weeks on/1 week offstarting5/18/20. Abraxane started with C1D8 on 12/30/18, stopped after C3D1 due to neuropathy.  INTERVAL HISTORY:  Ernest Wu is here for a follow up and treatment. He presents to the clinic with his wife. He reports intermittent numbness in his hands, worse in his right, and tingling in his fingers and toes. He cannot grip anything and dropped a spoon while eating. He strength is normal. His wife reports he did not recover from treatment last week, and he has been lying on the couch all week severely fatigued. He had one episode of vomiting on his last treatment day. He has abdominal bloating. It is difficult for him to lay on his side. He has significantly decreased appetite, but weight is stable today and he continues to adequately hydrate. His bowel movements fluctuate.   They have a family trip planned in August and want the patient to feel well for the trip. His wife is concerned with his quality of life.  REVIEW OF SYSTEMS:   Constitutional: Denies fevers, chills or abnormal weight loss (+) severe fatigue Eyes: Denies blurriness of vision Ears, nose, mouth, throat, and face: Denies mucositis or sore throat Respiratory: Denies cough, dyspnea or wheezes Cardiovascular: Denies palpitation, chest discomfort or lower extremity swelling Gastrointestinal:  Denies nausea, heartburn or change in bowel habits (+) vomiting, resolved (+) abdominal bloating Skin: Denies abnormal skin rashes Lymphatics: Denies new lymphadenopathy or easy bruising Neurological: (+) numbness in bilateral  hands, tingling in hands and feet Behavioral/Psych: Mood is stable, no new changes  All other  systems were reviewed with the patient and are negative.  MEDICAL HISTORY:  Past Medical History:  Diagnosis Date  . Anxiety   . Cancer ([place]) 06/2018   Pancreatic  . Family history of breast cancer   . Family history of colonic polyps   . Family history of pancreatic cancer   . Family history of prostate cancer   . GERD (gastroesophageal reflux disease)     SURGICAL HISTORY: Past Surgical History:  Procedure Laterality Date  . BILIARY STENT PLACEMENT  06/10/2018   Procedure: BILIARY STENT PLACEMENT;  Surgeon: Ronnette Juniper, MD;  Location: [facility] ENDOSCOPY;  Service: Gastroenterology;;  . CHOLECYSTECTOMY N/A 11/10/2018   Procedure: Laparoscopic Cholecystectomy;  Surgeon: Stark Klein, MD;  Location: [place];  Service: General;  Laterality: N/A;  . DIAGNOSTIC LAPAROSCOPIC LIVER BIOPSY N/A 11/10/2018   Procedure: Diagnostic Laparoscopic Liver Biopsy;  Surgeon: Stark Klein, MD;  Location: [place];  Service: General;  Laterality: N/A;  . ERCP N/A 06/10/2018   Procedure: ENDOSCOPIC RETROGRADE CHOLANGIOPANCREATOGRAPHY (ERCP);  Surgeon: Ronnette Juniper, MD;  Location: [place];  Service: Gastroenterology;  Laterality: N/A;  . ESOPHAGOGASTRODUODENOSCOPY (EGD) WITH PROPOFOL N/A 06/10/2018   Procedure: ESOPHAGOGASTRODUODENOSCOPY (EGD) WITH PROPOFOL;  Surgeon: Ronnette Juniper, MD;  Location: [place];  Service: Gastroenterology;  Laterality: N/A;  . FINE NEEDLE ASPIRATION  06/10/2018   Procedure: FINE NEEDLE ASPIRATION (FNA) LINEAR;  Surgeon: Ronnette Juniper, MD;  Location: [place];  Service: Gastroenterology;;  . IR RADIOLOGIST EVAL & MGMT  12/10/2018  . PORT-A-CATH REMOVAL N/A 11/10/2018   Procedure: REMOVAL PORT-A-CATH;  Surgeon: Stark Klein, MD;  Location: [place];  Service: General;  Laterality: N/A;  . PORTACATH PLACEMENT N/A 07/02/2018   Procedure: INSERTION PORT-A-CATH;  Surgeon: Stark Klein, MD;  Location: [place];  Service: General;  Laterality: N/A;  . SPHINCTEROTOMY  06/10/2018   Procedure:  SPHINCTEROTOMY;  Surgeon: Ronnette Juniper, MD;  Location: [facility] ENDOSCOPY;  Service: Gastroenterology;;  . UPPER ESOPHAGEAL ENDOSCOPIC ULTRASOUND (EUS) N/A 06/10/2018   Procedure: UPPER ESOPHAGEAL ENDOSCOPIC ULTRASOUND (EUS);  Surgeon: Ronnette Juniper, MD;  Location: [place];  Service: Gastroenterology;  Laterality: N/A;  . VASECTOMY      I have reviewed the social history and family history with the patient and they are unchanged from previous note.  ALLERGIES:  is allergic to contrast media [iodinated diagnostic agents] and iohexol.  MEDICATIONS:  Current Outpatient Medications  Medication Sig Dispense Refill  . docusate sodium (COLACE) 100 MG capsule Take 200 mg by mouth daily as needed for mild constipation.    Marland Kitchen esomeprazole (NEXIUM) 20 MG capsule Take 20 mg by mouth daily after breakfast.     . folic acid (FOLVITE) 1 MG tablet Take 1 tablet (1 mg total) by mouth daily after breakfast. 30 tablet 3  . magic mouthwash w/lidocaine SOLN Take 5 mLs by mouth 4 (four) times daily as needed for mouth pain. 240 mL 2  . ondansetron (ZOFRAN) 8 MG tablet Take 1 tablet (8 mg total) by mouth every 8 (eight) hours as needed for nausea or vomiting. 30 tablet 1  . polyethylene glycol (MIRALAX / GLYCOLAX) 17 g packet Take 17 g by mouth 2 (two) times daily.    . prochlorperazine (COMPAZINE) 10 MG tablet Take 1 tablet (10 mg total) by mouth every 6 (six) hours as needed (NAUSEA). 30 tablet 1  . rivaroxaban (XARELTO) 20 MG TABS tablet Take 1 tablet (20 mg  total) by mouth daily after breakfast. 30 tablet 2  . simethicone (MYLICON) 185 MG chewable tablet Chew 250 mg by mouth every 6 (six) hours as needed for flatulence.     . Syringe, Disposable, (10-12CC SYRINGE) 12 ML MISC 10 mLs by Does not apply route as needed. 1 each 1  . traMADol (ULTRAM) 50 MG tablet Take 50 mg by mouth every 6 (six) hours as needed.    . diphenoxylate-atropine (LOMOTIL) 2.5-0.025 MG tablet Take 2 tablets by mouth 4 (four) times daily as  needed for diarrhea or loose stools. (Patient not taking: Reported on 12/26/2018) 40 tablet 0   No current facility-administered medications for this visit.     PHYSICAL EXAMINATION: ECOG PERFORMANCE STATUS: 2 - Symptomatic, <50% confined to bed  Vitals:   01/26/19 1043  BP: 137/87  Pulse: 89  Resp: 18  Temp: 98.7 F (37.1 C)  SpO2: 96%   Filed Weights   01/26/19 1043  Weight: 186 lb 1.6 oz (84.4 kg)    GENERAL:alert, no distress and comfortable SKIN: skin color, texture, turgor are normal, no rashes or significant lesions EYES: normal, Conjunctiva are pink and non-injected, sclera clear OROPHARYNX:no exudate, no erythema and lips, buccal mucosa, and tongue normal  NECK: supple, thyroid normal size, non-tender, without nodularity LYMPH:  no palpable lymphadenopathy in the cervical, axillary  LUNGS: clear to auscultation and percussion with normal breathing effort HEART: regular rate & rhythm and no murmurs and no lower extremity edema ABDOMEN: (+) mild abdominal distension (+) hepatomegaly, palpable 10cm below the rib cage (+) LUQ abdominal tenderness  Musculoskeletal:no cyanosis of digits and no clubbing  NEURO: alert & oriented x 3 with fluent speech (+) decreased sensation in bilateral upper extremities, strength normal  LABORATORY DATA:  I have reviewed the data as listed CBC Latest Ref Rng & Units 01/26/2019 01/19/2019 01/05/2019  WBC 4.0 - 10.5 K/uL 3.5(L) 5.0 2.8(L)  Hemoglobin 13.0 - 17.0 g/dL 11.2(L) 11.2(L) 11.0(L)  Hematocrit 39.0 - 52.0 % 35.5(L) 35.0(L) 34.4(L)  Platelets 150 - 400 K/uL 319 476(H) 138(L)     CMP Latest Ref Rng & Units 01/26/2019 01/19/2019 01/05/2019  Glucose 70 - 99 mg/dL 142(H) 111(H) 134(H)  BUN 6 - 20 mg/dL _0 Creatinine 0.61 - 1.24 mg/dL 0.79 0.74 0.70  Sodium 135 - 145 mmol/L 140 141 140  Potassium 3.5 - 5.1 mmol/L 4.4 4.2 3.9  Chloride 98 - 111 mmol/L 101 104 105  CO2 22 - 32 mmol/L _1 Calcium 8.9 - 10.3 mg/dL 9.5 9.1 9.1   Total Protein 6.5 - 8.1 g/dL 6.9 6.9 6.7  Total Bilirubin 0.3 - 1.2 mg/dL <0.2(L) <0.2(L) <0.2(L)  Alkaline Phos 38 - 126 U/L 142(H) 101 95  AST 15 - 41 U/L 36 17 29  ALT 0 - 44 U/L 76(H) 26 60(H)      RADIOGRAPHIC STUDIES: I have personally reviewed the radiological images as listed and agreed with the findings in the report. No results found.   ASSESSMENT & PLAN:  AMERICO VALLERY is a 58 y.o. male with   1. Adenocarcinoma of the pancreas,cT3N1M0, borderline resectable, liver metastasis in 11/2018 -Diagnosed in 06/2018. Treated with neoadjuvant chemoFOLFIRINOX. -He underwent Cholecystectomy and liver biopsy on 11/10/18 with Dr. Miles Costain Whipple surgery wasaborted due to new liver mets found during surgery.Givenmetastasisdisease his cancer is no longer curable but still treatable.  -Unfortunately after his surgery he developed anabscessat surgical sitewhich caused sepsis secondary to GNR bacteremia. He was treated  with antibiotics andtemporaryJP drain of abscess.He has recovered well. -He started second-line chemo with Gemcitabine and Abraxane on 12/22/18. He did not tolerate chemo well.   - Se[facility] at [facility] on 01/23/19 by Dr. Waylan Boga Mettu for second opinion. Discussed taking a break off chemo for a few weeks to recover.  -due to his worsening peripheral neuropathy, which has impacted her right hand function, I will stop Abraxane and continue single agent gemcitabine.  We discussed the response rate from gemcitabine alone is likely going to be low. Pt does not seem to be willing to tolerate chemo side effects well and his wife is very concerned about his quality of life.  -Labs reviewed, CBC and CMP WNL except WBC 3.5, Hg 11.2, HCT 35.5, RDW 15.9, alkaline phosphatase 142, ALT 76.  -will give 2 more weeks of chemo break, and restart with gemcitabine alone on next visit -He has developed significant abdominal bloating, nausea and low appetite, exam showed significantly enlarged liver  with tenderness, I am concerned about her cancer progression, will repeat restaging scan next month.   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  -Heis onXarelto20 mg daily now, continueindefinitely  4. Constipation, Gas, bloating and abdominal pain  -Constipation secondary to chemo and premedications. He takes Optician, dispensing as needed -Recent Gas, bloating and bowel cramps are related to his recent peritoneal metastasis which was suspected on recent scan.  -He currenlty does not have constipation, but reports abdominal bloating -I encouraged him to take OTC gas-x. He can continue Tylneol for current pain. He has oxycodone which he can use for significant persistent pain.   5.  Peripheral neuropathy, G2 -Second to chemotherapy, especially recent Abraxane -I encouraged him to take B complex or B12 -Mary numbness, that gabapentin if he develops significant tingling or pain  6.Goal of care discussion  -We previously discussed the incurable nature of his cancer, and the overall poor prognosis, especially if he does not have good response to chemotherapy or progress on chemo -The patient understands the goal of care is palliative. -He is full code now   Plan -Discontinue Abraxane due to severe neuropathy -hold chemo for 2 more weeks, and restart with Gemcitabine alone on 02/09/19  -Lab, f/u and gemcitabine in 2 weeks on 02/09/19  -will repeat CT CAP with contrast after next cycle chemo,order on next visit    No problem-specific Assessment & Plan notes found for this encounter.   No orders of the defined types  were placed in this encounter.  All questions were answered. The patient knows to call the clinic with any problems, questions or concerns. No barriers to learning was detected. I spent 20 minutes counseling the patient face to face. The total time spent in the appointment was 25 minutes and more than 50% was on counseling and review of test results     [name], MD 01/26/2019   I, Cloyde Reams Dorshimer, am acting as scribe for [name], MD.   I have reviewed the above documentation for accuracy and completeness, and I agree with the above.

## 2019-01-26 ENCOUNTER — Inpatient Hospital Stay: Payer: Managed Care, Other (non HMO)

## 2019-01-26 ENCOUNTER — Encounter: Payer: Self-pay | Admitting: Hematology

## 2019-01-26 ENCOUNTER — Other Ambulatory Visit: Payer: Self-pay

## 2019-01-26 ENCOUNTER — Inpatient Hospital Stay (HOSPITAL_BASED_OUTPATIENT_CLINIC_OR_DEPARTMENT_OTHER): Payer: Managed Care, Other (non HMO) | Admitting: Hematology

## 2019-01-26 VITALS — BP 137/87 | HR 89 | Temp 98.7°F | Resp 18 | Ht 72.0 in | Wt 186.1 lb

## 2019-01-26 DIAGNOSIS — Z86718 Personal history of other venous thrombosis and embolism: Secondary | ICD-10-CM

## 2019-01-26 DIAGNOSIS — C257 Malignant neoplasm of other parts of pancreas: Secondary | ICD-10-CM | POA: Diagnosis not present

## 2019-01-26 DIAGNOSIS — F17210 Nicotine dependence, cigarettes, uncomplicated: Secondary | ICD-10-CM

## 2019-01-26 DIAGNOSIS — R140 Abdominal distension (gaseous): Secondary | ICD-10-CM

## 2019-01-26 DIAGNOSIS — R918 Other nonspecific abnormal finding of lung field: Secondary | ICD-10-CM

## 2019-01-26 DIAGNOSIS — G620 Drug-induced polyneuropathy: Secondary | ICD-10-CM

## 2019-01-26 DIAGNOSIS — Z7901 Long term (current) use of anticoagulants: Secondary | ICD-10-CM

## 2019-01-26 DIAGNOSIS — C787 Secondary malignant neoplasm of liver and intrahepatic bile duct: Secondary | ICD-10-CM | POA: Diagnosis not present

## 2019-01-26 DIAGNOSIS — I700 Atherosclerosis of aorta: Secondary | ICD-10-CM

## 2019-01-26 DIAGNOSIS — Z79899 Other long term (current) drug therapy: Secondary | ICD-10-CM

## 2019-01-26 DIAGNOSIS — R112 Nausea with vomiting, unspecified: Secondary | ICD-10-CM

## 2019-01-26 DIAGNOSIS — C259 Malignant neoplasm of pancreas, unspecified: Secondary | ICD-10-CM

## 2019-01-26 DIAGNOSIS — T451X5S Adverse effect of antineoplastic and immunosuppressive drugs, sequela: Secondary | ICD-10-CM

## 2019-01-26 DIAGNOSIS — R160 Hepatomegaly, not elsewhere classified: Secondary | ICD-10-CM

## 2019-01-26 LAB — CBC WITH DIFFERENTIAL (CANCER CENTER ONLY)
Abs Immature Granulocytes: 0.01 10*3/uL (ref 0.00–0.07)
Basophils Absolute: 0.1 10*3/uL (ref 0.0–0.1)
Basophils Relative: 1 %
Eosinophils Absolute: 0 10*3/uL (ref 0.0–0.5)
Eosinophils Relative: 0 %
HCT: 35.5 % — ABNORMAL LOW (ref 39.0–52.0)
Hemoglobin: 11.2 g/dL — ABNORMAL LOW (ref 13.0–17.0)
Immature Granulocytes: 0 %
Lymphocytes Relative: 25 %
Lymphs Abs: 0.9 10*3/uL (ref 0.7–4.0)
MCH: 28.7 pg (ref 26.0–34.0)
MCHC: 31.5 g/dL (ref 30.0–36.0)
MCV: 91 fL (ref 80.0–100.0)
Monocytes Absolute: 0.5 10*3/uL (ref 0.1–1.0)
Monocytes Relative: 14 %
Neutro Abs: 2.1 10*3/uL (ref 1.7–7.7)
Neutrophils Relative %: 60 %
Platelet Count: 319 10*3/uL (ref 150–400)
RBC: 3.9 MIL/uL — ABNORMAL LOW (ref 4.22–5.81)
RDW: 15.9 % — ABNORMAL HIGH (ref 11.5–15.5)
WBC Count: 3.5 10*3/uL — ABNORMAL LOW (ref 4.0–10.5)
nRBC: 0 % (ref 0.0–0.2)

## 2019-01-26 LAB — CMP (CANCER CENTER ONLY)
ALT: 76 U/L — ABNORMAL HIGH (ref 0–44)
AST: 36 U/L (ref 15–41)
Albumin: 3.3 g/dL — ABNORMAL LOW (ref 3.5–5.0)
Alkaline Phosphatase: 142 U/L — ABNORMAL HIGH (ref 38–126)
Anion gap: 10 (ref 5–15)
BUN: 6 mg/dL (ref 6–20)
CO2: 29 mmol/L (ref 22–32)
Calcium: 9.5 mg/dL (ref 8.9–10.3)
Chloride: 101 mmol/L (ref 98–111)
Creatinine: 0.79 mg/dL (ref 0.61–1.24)
GFR, Est AFR Am: >60 mL/min (ref >60)
GFR, Estimated: >60 mL/min (ref >60)
Glucose, Bld: 142 mg/dL — ABNORMAL HIGH (ref 70–99)
Potassium: 4.4 mmol/L (ref 3.5–5.1)
Sodium: 140 mmol/L (ref 135–145)
Total Bilirubin: <0.2 mg/dL — ABNORMAL LOW (ref 0.3–1.2)
Total Protein: 6.9 g/dL (ref 6.5–8.1)

## 2019-01-27 ENCOUNTER — Telehealth: Payer: Self-pay | Admitting: Hematology

## 2019-01-27 LAB — CANCER ANTIGEN 19-9: CA 19-9: 148 U/mL — ABNORMAL HIGH (ref 0–35)

## 2019-01-27 NOTE — Telephone Encounter (Signed)
No los per 6/22. °

## 2019-01-30 ENCOUNTER — Telehealth: Payer: Self-pay | Admitting: *Deleted

## 2019-01-30 ENCOUNTER — Telehealth: Payer: Self-pay | Admitting: Medical

## 2019-01-30 ENCOUNTER — Other Ambulatory Visit: Payer: Self-pay

## 2019-01-30 ENCOUNTER — Ambulatory Visit (HOSPITAL_COMMUNITY)
Admission: RE | Admit: 2019-01-30 | Discharge: 2019-01-30 | Disposition: A | Discharge status: Home / Self Care | Payer: Managed Care, Other (non HMO) | Source: Ambulatory Visit | Attending: Medical | Admitting: Medical

## 2019-01-30 ENCOUNTER — Telehealth: Payer: Self-pay | Admitting: Hematology

## 2019-01-30 ENCOUNTER — Inpatient Hospital Stay (HOSPITAL_BASED_OUTPATIENT_CLINIC_OR_DEPARTMENT_OTHER): Payer: Managed Care, Other (non HMO) | Admitting: Medical

## 2019-01-30 VITALS — BP 135/95 | HR 102 | Temp 100.1°F | Resp 18 | Ht 72.0 in | Wt 190.8 lb

## 2019-01-30 DIAGNOSIS — R197 Diarrhea, unspecified: Secondary | ICD-10-CM

## 2019-01-30 DIAGNOSIS — C259 Malignant neoplasm of pancreas, unspecified: Secondary | ICD-10-CM | POA: Insufficient documentation

## 2019-01-30 DIAGNOSIS — C787 Secondary malignant neoplasm of liver and intrahepatic bile duct: Secondary | ICD-10-CM

## 2019-01-30 DIAGNOSIS — R180 Malignant ascites: Secondary | ICD-10-CM | POA: Insufficient documentation

## 2019-01-30 DIAGNOSIS — Z803 Family history of malignant neoplasm of breast: Secondary | ICD-10-CM

## 2019-01-30 DIAGNOSIS — I700 Atherosclerosis of aorta: Secondary | ICD-10-CM

## 2019-01-30 DIAGNOSIS — C257 Malignant neoplasm of other parts of pancreas: Secondary | ICD-10-CM

## 2019-01-30 DIAGNOSIS — Z7901 Long term (current) use of anticoagulants: Secondary | ICD-10-CM

## 2019-01-30 DIAGNOSIS — R918 Other nonspecific abnormal finding of lung field: Secondary | ICD-10-CM

## 2019-01-30 DIAGNOSIS — R5383 Other fatigue: Secondary | ICD-10-CM

## 2019-01-30 DIAGNOSIS — Z800 Family history of malignant neoplasm of digestive organs: Secondary | ICD-10-CM

## 2019-01-30 DIAGNOSIS — Z8042 Family history of malignant neoplasm of prostate: Secondary | ICD-10-CM

## 2019-01-30 DIAGNOSIS — F17210 Nicotine dependence, cigarettes, uncomplicated: Secondary | ICD-10-CM

## 2019-01-30 DIAGNOSIS — Z79899 Other long term (current) drug therapy: Secondary | ICD-10-CM

## 2019-01-30 DIAGNOSIS — Z86718 Personal history of other venous thrombosis and embolism: Secondary | ICD-10-CM

## 2019-01-30 MED ORDER — LIDOCAINE HCL 1 % IJ SOLN
INTRAMUSCULAR | Status: AC
  Filled 2019-01-30: qty 10

## 2019-01-30 NOTE — Telephone Encounter (Signed)
No los per 6/26. °

## 2019-01-30 NOTE — Telephone Encounter (Signed)
YF PAL moved 7/6 f/u from YF to LB and adjusted associated appointments. Spoke with dtr re change and new time.

## 2019-01-30 NOTE — Progress Notes (Signed)
[facility] Progress Note   Ernest Wu [id number] 05-Jan-1961 58 y.o.  [name] is managed by Dr. [name]  Actively treated with chemotherapy/immunotherapy/hormonal therapy: yes  Current therapy: Gemzar  Last treated: 01/19/2019 (cycle 2, day 1)  Next scheduled appointment with provider: 02/09/2019  Assessment: Plan:    Adenocarcinoma of pancreas ([place]) - Plan: US Paracentesis  Malignant ascites - Plan: US Paracentesis   Metastatic adenocarcinoma of the pancreas: [name] is status post cycle 2, day 1 of gemcitabine which was dosed on 01/19/2019.  He is scheduled to return for follow-up on 02/09/2019.  Ascites: Patient was referred for a paracentesis today with cytology ordered.  Please see After Visit Summary for patient specific instructions.  Future Appointments  Date Time Provider [place]  02/09/2019  8:45 AM [facility] LAB 6 [facility] None  02/09/2019  9:15 AM Alla Feeling, NP [facility] None  02/09/2019 10:00 AM [facility] INFUSION [facility] None  02/16/2019 11:45 AM [facility] LAB 2 [facility] None  02/16/2019 12:15 PM Alla Feeling, NP [facility] None  02/16/2019  1:00 PM [facility] INFUSION [facility] None    Orders Placed This Encounter  Procedures   US Paracentesis       Subjective:   Patient ID:  Ernest Wu is a 58 y.o. (DOB [birth year]) male.  Chief Complaint: No chief complaint on file.   HPI Ernest Wu   is a 58 year old male with a history of a metastatic pancreatic cancer who is managed by Dr. Burr Medico and is status post cycle 2, day 1 of gemcitabine which was dosed on 01/19/2019.  He was seen by Dr. Burr Medico on 01/26/2019.  At that time he reported that he was having some abdominal distention.  Since that time his abdominal distention has worsened.  He has had an almost 5 pound weight increase since that visit.  He is having pain due to the distention of his abdomen.  He also is having some shortness of  breath as he is unable to take a deep breath because of his abdominal distention.  He denies fevers, chills, sweats, nausea, vomiting, constipation, or diarrhea.  He has ongoing loose stools however.  Medications: I have reviewed the patient's current medications.  Allergies:  Allergies  Allergen Reactions   Contrast Media [Iodinated Diagnostic Agents] Other (See Comments)    Burning sensation [SEE CONTRAST MEDIA]   Iohexol Anaphylaxis, Itching and Rash    Past Medical History:  Diagnosis Date   Anxiety    Cancer ([place]) 06/2018   Pancreatic   Family history of breast cancer    Family history of colonic polyps    Family history of pancreatic cancer    Family history of prostate cancer    GERD (gastroesophageal reflux disease)     Past Surgical History:  Procedure Laterality Date   BILIARY STENT PLACEMENT  06/10/2018   Procedure: BILIARY STENT PLACEMENT;  Surgeon: Ronnette Juniper, MD;  Location: [place];  Service: Gastroenterology;;   CHOLECYSTECTOMY N/A 11/10/2018   Procedure: Laparoscopic Cholecystectomy;  Surgeon: Stark Klein, MD;  Location: [place];  Service: General;  Laterality: N/A;   DIAGNOSTIC LAPAROSCOPIC LIVER BIOPSY N/A 11/10/2018   Procedure: Diagnostic Laparoscopic Liver Biopsy;  Surgeon: Stark Klein, MD;  Location: [place];  Service: General;  Laterality: N/A;   ERCP N/A 06/10/2018   Procedure: ENDOSCOPIC RETROGRADE CHOLANGIOPANCREATOGRAPHY (ERCP);  Surgeon: Ronnette Juniper, MD;  Location: [place];  Service: Gastroenterology;  Laterality: N/A;   ESOPHAGOGASTRODUODENOSCOPY (EGD) WITH PROPOFOL N/A  06/10/2018   Procedure: ESOPHAGOGASTRODUODENOSCOPY (EGD) WITH PROPOFOL;  Surgeon: Ronnette Juniper, MD;  Location: [place];  Service: Gastroenterology;  Laterality: N/A;   FINE NEEDLE ASPIRATION  06/10/2018   Procedure: FINE NEEDLE ASPIRATION (FNA) LINEAR;  Surgeon: Ronnette Juniper, MD;  Location: [facility] ENDOSCOPY;  Service: Gastroenterology;;   IR RADIOLOGIST EVAL & MGMT   12/10/2018   PORT-A-CATH REMOVAL N/A 11/10/2018   Procedure: REMOVAL PORT-A-CATH;  Surgeon: Stark Klein, MD;  Location: [place];  Service: General;  Laterality: N/A;   PORTACATH PLACEMENT N/A 07/02/2018   Procedure: INSERTION PORT-A-CATH;  Surgeon: Stark Klein, MD;  Location: [place];  Service: General;  Laterality: N/A;   SPHINCTEROTOMY  06/10/2018   Procedure: SPHINCTEROTOMY;  Surgeon: Ronnette Juniper, MD;  Location[facility] ENDOSCOPY;  Service: Gastroenterology;;   UPPER ESOPHAGEAL ENDOSCOPIC ULTRASOUND (EUS) N/A 06/10/2018   Procedure: UPPER ESOPHAGEAL ENDOSCOPIC ULTRASOUND (EUS);  Surgeon: Ronnette Juniper, MD;  Location: [place];  Service: Gastroenterology;  Laterality: N/A;   VASECTOMY      Family History  Problem Relation Age of Onset   Stroke Mother    Hypertension Mother    CAD Father        CABG   Pancreatic cancer Paternal Grandfather    Breast cancer Maternal Aunt    Lymphoma Paternal Uncle    Other Maternal Uncle        [name]   Stroke Maternal Grandmother    Prostate cancer Cousin        pat first cousin, dx between 2-60    Social History   Socioeconomic History   Marital status: Married    Spouse name: Not on file   Number of children: 5   Years of education: Not on file   Highest education level: Not on file  Occupational History   Not on file  Social Needs   Financial resource strain: Not on file   Food insecurity    Worry: Not on file    Inability: Not on file   Transportation needs    Medical: Not on file    Non-medical: Not on file  Tobacco Use   Smoking status: Current Every Day Smoker    Packs/day: 0.25    Years: 30.00    Pack years: 7.50   Smokeless tobacco: Never Used   Tobacco comment: cut back considerably  Substance and Sexual Activity   Alcohol use: Yes    Comment: 6-7 beers Q week   Drug use: Never   Sexual activity: Not on file  Lifestyle   Physical activity    Days per week: Not on file    Minutes per  session: Not on file   Stress: Not on file  Relationships   Social connections    Talks on phone: Not on file    Gets together: Not on file    Attends religious service: Not on file    Active member of club or organization: Not on file    Attends meetings of clubs or organizations: Not on file    Relationship status: Not on file   Intimate partner violence    Fear of current or ex partner: Not on file    Emotionally abused: Not on file    Physically abused: Not on file    Forced sexual activity: Not on file  Other Topics Concern   Not on file  Social History Narrative   Not on file    Past Medical History, Surgical history, Social history, and Family history were  reviewed and updated as appropriate.   Please see review of systems for further details on the patient's review from today.   Review of Systems:  Review of Systems  Constitutional: Negative for activity change, appetite change, chills, diaphoresis and fever.  HENT: Negative for trouble swallowing.   Respiratory: Positive for shortness of breath. Negative for cough and chest tightness.   Cardiovascular: Negative for chest pain, palpitations and leg swelling.  Gastrointestinal: Positive for abdominal distention. Negative for abdominal pain, constipation, diarrhea, nausea and vomiting.    Objective:   Physical Exam:  BP (!) 135/95 (BP Location: Left Arm, Patient Position: Sitting)    Pulse (!) 102    Temp 100.1 F (37.8 C) (Temporal)    Resp 18    Ht 6' (1.829 m)    Wt 190 lb 12.8 oz (86.5 kg)    SpO2 99%    BMI 25.88 kg/m  ECOG: 1  Physical Exam Constitutional:      General: He is not in acute distress.    Appearance: He is not diaphoretic.  HENT:     Head: Normocephalic and atraumatic.  Eyes:     General: No scleral icterus.       Right eye: No discharge.        Left eye: No discharge.     Conjunctiva/sclera: Conjunctivae normal.  Cardiovascular:     Rate and Rhythm: Normal rate and regular rhythm.       Heart sounds: Normal heart sounds. No murmur. No friction rub. No gallop.   Pulmonary:     Effort: Pulmonary effort is normal. No respiratory distress.     Breath sounds: Normal breath sounds. No wheezing or rales.  Abdominal:     General: Bowel sounds are normal. There is distension.     Palpations: There is mass.     Tenderness: There is abdominal tenderness.     Comments: Significant hepatomegaly noted.  The edge of the liver is loose hard.  Skin:    General: Skin is warm and dry.     Findings: No erythema or rash.  Neurological:     Mental Status: He is alert.     Gait: Gait normal.  Psychiatric:        Mood and Affect: Mood normal.        Behavior: Behavior normal.        Thought Content: Thought content normal.        Judgment: Judgment normal.     Lab Review:     Component Value Date/Time   NA 140 01/26/2019 1020   K 4.4 01/26/2019 1020   CL 101 01/26/2019 1020   CO2 29 01/26/2019 1020   GLUCOSE 142 (H) 01/26/2019 1020   BUN 6 01/26/2019 1020   CREATININE 0.79 01/26/2019 1020   CALCIUM 9.5 01/26/2019 1020   PROT 6.9 01/26/2019 1020   ALBUMIN 3.3 (L) 01/26/2019 1020   AST 36 01/26/2019 1020   ALT 76 (H) 01/26/2019 1020   ALKPHOS 142 (H) 01/26/2019 1020   BILITOT <0.2 (L) 01/26/2019 1020   GFRNONAA >60 01/26/2019 1020   GFRAA >60 01/26/2019 1020       Component Value Date/Time   WBC 3.5 (L) 01/26/2019 1020   WBC 4.3 12/26/2018 1404   RBC 3.90 (L) 01/26/2019 1020   HGB 11.2 (L) 01/26/2019 1020   HGB 10.5 (L) 11/19/2018 0236   HCT 35.5 (L) 01/26/2019 1020   PLT 319 01/26/2019 1020   MCV 91.0 01/26/2019 1020  MCH 28.7 01/26/2019 1020   MCHC 31.5 01/26/2019 1020   RDW 15.9 (H) 01/26/2019 1020   LYMPHSABS 0.9 01/26/2019 1020   MONOABS 0.5 01/26/2019 1020   EOSABS 0.0 01/26/2019 1020   BASOSABS 0.1 01/26/2019 1020   -------------------------------  Imaging from last 24 hours (if applicable):  Radiology interpretation: Ct Chest Wo  Contrast  Result Date: 01/09/2019 CLINICAL DATA:  Pancreatic cancer diagnosed June 07, 2018. Chemotherapy ongoing. EXAM: CT CHEST WITHOUT CONTRAST TECHNIQUE: Multidetector CT imaging of the chest was performed following the standard protocol without IV contrast. COMPARISON:  CT 12/10/2018 FINDINGS: Cardiovascular: Ascending thoracic aorta is mildly aneurysmal at 42 mm. Coronary artery calcification and aortic atherosclerotic calcification. Mediastinum/Nodes: No axillary supraclavicular adenopathy. No mediastinal hilar adenopathy. No pericardial effusion. Esophagus normal Lungs/Pleura: Biapical pulmonary parenchymal thickening. Rounded 4 mm nodule in the RIGHT lower lobe (image 127/5) not identified on CT 11 27 19. Subpleural nodule in the more superior RIGHT lower lobe measuring 3 mm (85/5) is also not identified. Third small nodule in the RIGHT lower lobe measuring 3 mm on image 75 is also new. Less defined nodule in the LEFT lower lobe medially measuring 4 mm (image 118) is present on comparison exam. Upper Abdomen: Limited view of the upper abdomen demonstrates pneumobilia as expected. No inflammation or infection identified Musculoskeletal: No aggressive osseous lesion. IMPRESSION: 1. Three small (less than 5 mm) pulmonary nodules in the RIGHT lower lobe are not identified on comparison CT from 07/02/2018 and therefore concerning for metastatic nodules. 2. Mild aneurysmal dilatation of the ascending thoracic aorta. Recommend annual imaging followup by CTA or MRA. This recommendation follows 2010 ACCF/AHA/AATS/ACR/ASA/SCA/SCAI/SIR/STS/SVM Guidelines for the Diagnosis and Management of Patients with Thoracic Aortic Disease. Circulation. 2010; 121: Y195-K932. Aortic aneurysm NOS (ICD10-I71.9) Electronically Signed   By: Suzy Bouchard M.D.   On: 01/09/2019 09:52   US Paracentesis  Result Date: 01/30/2019 INDICATION: Pancreatic cancer with ascites. Request for diagnostic and therapeutic paracentesis. EXAM:  ULTRASOUND GUIDED PARACENTESIS MEDICATIONS: 1% lidocaine 10 mL COMPLICATIONS: None immediate. PROCEDURE: Informed written consent was obtained from the patient after a discussion of the risks, benefits and alternatives to treatment. A timeout was performed prior to the initiation of the procedure. Initial ultrasound scanning demonstrates a moderate amount of ascites within the right lower abdominal quadrant. The right lower abdomen was prepped and draped in the usual sterile fashion. 1% lidocaine with epinephrine was used for local anesthesia. Following this, a 19 gauge, 7-cm, Yueh catheter was introduced. An ultrasound image was saved for documentation purposes. The paracentesis was performed. The catheter was removed and a dressing was applied. The patient tolerated the procedure well without immediate post procedural complication. FINDINGS: A total of approximately 2.5 L of clear yellow fluid was removed. Samples were sent to the laboratory as requested by the clinical team. IMPRESSION: Successful ultrasound-guided paracentesis yielding 2.5 liters of peritoneal fluid. Read by: Gareth Eagle PA-C Electronically Signed   By: Jerilynn Mages.  Shick M.D.   On: 01/30/2019 16:46        This case was discussed with Dr. Burr Medico. She expressed agreement with my management of this patient.

## 2019-01-30 NOTE — Procedures (Signed)
PROCEDURE SUMMARY:  Successful US guided paracentesis from right lateral abdomen.  Yielded 2.5 liters of clear yellow fluid.  No immediate complications.  Patient tolerated well.  EBL = trace  Specimen was sent for labs.  Murrell Redden PA-C 01/30/2019 4:47 PM

## 2019-01-30 NOTE — Telephone Encounter (Signed)
Wife [name] called reporting that pt feeling very bloated.  Spoke with wife, and was informed that pt's abdomen is very distended - feeling bloated and cramping.  Pt had taken Miralax, stool softener, glycerin suppository , and had very little loose bms.   Pt c/o abdominal cramping a lot.  [name] did not think it is constipation but the cancer related symptom. Lucianne Lei, [place] notified.  Instructed [name] to bring pt in now for [facility] visit.

## 2019-02-01 ENCOUNTER — Encounter (HOSPITAL_COMMUNITY): Payer: Self-pay

## 2019-02-01 ENCOUNTER — Other Ambulatory Visit: Payer: Self-pay

## 2019-02-01 ENCOUNTER — Emergency Department (HOSPITAL_COMMUNITY)
Admission: EM | Admit: 2019-02-01 | Discharge: 2019-02-01 | Disposition: A | Discharge status: Home / Self Care | Payer: Managed Care, Other (non HMO) | Source: Home / Self Care | Attending: Emergency Medicine | Admitting: Emergency Medicine

## 2019-02-01 ENCOUNTER — Emergency Department (HOSPITAL_COMMUNITY): Payer: Managed Care, Other (non HMO)

## 2019-02-01 DIAGNOSIS — Z7901 Long term (current) use of anticoagulants: Secondary | ICD-10-CM | POA: Insufficient documentation

## 2019-02-01 DIAGNOSIS — F17210 Nicotine dependence, cigarettes, uncomplicated: Secondary | ICD-10-CM | POA: Insufficient documentation

## 2019-02-01 DIAGNOSIS — C786 Secondary malignant neoplasm of retroperitoneum and peritoneum: Secondary | ICD-10-CM | POA: Diagnosis not present

## 2019-02-01 DIAGNOSIS — K5900 Constipation, unspecified: Secondary | ICD-10-CM

## 2019-02-01 DIAGNOSIS — C259 Malignant neoplasm of pancreas, unspecified: Secondary | ICD-10-CM | POA: Insufficient documentation

## 2019-02-01 DIAGNOSIS — R112 Nausea with vomiting, unspecified: Secondary | ICD-10-CM | POA: Diagnosis not present

## 2019-02-01 DIAGNOSIS — Z79899 Other long term (current) drug therapy: Secondary | ICD-10-CM | POA: Insufficient documentation

## 2019-02-01 LAB — CBC WITH DIFFERENTIAL/PLATELET
Abs Immature Granulocytes: 0.02 10*3/uL (ref 0.00–0.07)
Basophils Absolute: 0 10*3/uL (ref 0.0–0.1)
Basophils Relative: 1 %
Eosinophils Absolute: 0.1 10*3/uL (ref 0.0–0.5)
Eosinophils Relative: 1 %
HCT: 39.5 % (ref 39.0–52.0)
Hemoglobin: 12.2 g/dL — ABNORMAL LOW (ref 13.0–17.0)
Immature Granulocytes: 0 %
Lymphocytes Relative: 12 %
Lymphs Abs: 1 10*3/uL (ref 0.7–4.0)
MCH: 27.6 pg (ref 26.0–34.0)
MCHC: 30.9 g/dL (ref 30.0–36.0)
MCV: 89.4 fL (ref 80.0–100.0)
Monocytes Absolute: 1 10*3/uL (ref 0.1–1.0)
Monocytes Relative: 11 %
Neutro Abs: 6.7 10*3/uL (ref 1.7–7.7)
Neutrophils Relative %: 75 %
Platelets: 330 10*3/uL (ref 150–400)
RBC: 4.42 MIL/uL (ref 4.22–5.81)
RDW: 15.8 % — ABNORMAL HIGH (ref 11.5–15.5)
WBC: 8.8 10*3/uL (ref 4.0–10.5)
nRBC: 0 % (ref 0.0–0.2)

## 2019-02-01 LAB — URINALYSIS, ROUTINE W REFLEX MICROSCOPIC
Bilirubin Urine: NEGATIVE
Glucose, UA: NEGATIVE mg/dL
Hgb urine dipstick: NEGATIVE
Ketones, ur: NEGATIVE mg/dL
Leukocytes,Ua: NEGATIVE
Nitrite: NEGATIVE
Protein, ur: NEGATIVE mg/dL
Specific Gravity, Urine: 1.027 (ref 1.005–1.030)
pH: 5 (ref 5.0–8.0)

## 2019-02-01 LAB — COMPREHENSIVE METABOLIC PANEL
ALT: 39 U/L (ref 0–44)
AST: 24 U/L (ref 15–41)
Albumin: 3.1 g/dL — ABNORMAL LOW (ref 3.5–5.0)
Alkaline Phosphatase: 116 U/L (ref 38–126)
Anion gap: 9 (ref 5–15)
BUN: 7 mg/dL (ref 6–20)
CO2: 27 mmol/L (ref 22–32)
Calcium: 8.7 mg/dL — ABNORMAL LOW (ref 8.9–10.3)
Chloride: 99 mmol/L (ref 98–111)
Creatinine, Ser: 0.67 mg/dL (ref 0.61–1.24)
GFR calc Af Amer: >60 mL/min (ref >60)
GFR calc non Af Amer: >60 mL/min (ref >60)
Glucose, Bld: 141 mg/dL — ABNORMAL HIGH (ref 70–99)
Potassium: 4.4 mmol/L (ref 3.5–5.1)
Sodium: 135 mmol/L (ref 135–145)
Total Bilirubin: 0.1 mg/dL — ABNORMAL LOW (ref 0.3–1.2)
Total Protein: 6.7 g/dL (ref 6.5–8.1)

## 2019-02-01 LAB — LIPASE, BLOOD: Lipase: 19 U/L (ref 11–51)

## 2019-02-01 NOTE — ED Notes (Signed)
ED Provider at bedside. 

## 2019-02-01 NOTE — ED Provider Notes (Signed)
[facility] DEPT Provider Note   CSN: [id number] Arrival date & time: 02/01/19  1224    History   Chief Complaint Chief Complaint  Patient presents with   Pancreatic Cancer   Constipation    HPI Ernest Wu is a 58 y.o. male.     58yo male with history of pancreatic cancer currently undergoing chemotherapy with last treatment 01/19/2019 presents with complaint of abdominal distention and constipation. Patient was seen in clinic 01/30/2019 for same, temp 100.1 in clinic (states he did not feel feverish and repeat temp in the office was normal), had diagnostic and therapeutic paracentesis done that day with removal on 2.5L yellow periotneal fluid which was sent for pathology with report pending. Patient states he feels like his belly is full of fluid again- feels tight, SHOB with lying supine or fully upright. Unable to pass significant stool or gas despite taking Miralax, stool softener, prune juice, milk of magnesia, glycerine suppositories. Prior abdominal surgeries include lap chole. No prior SBO. Denies vomiting, fevers. No other complaints or concerns.      Past Medical History:  Diagnosis Date   Anxiety    Cancer ([place]) 06/2018   Pancreatic   Family history of breast cancer    Family history of colonic polyps    Family history of pancreatic cancer    Family history of prostate cancer    GERD (gastroesophageal reflux disease)     Patient Active Problem List   Diagnosis Date Noted   Genetic testing 12/22/2018   Goals of care, counseling/discussion 12/10/2018   Family history of pancreatic cancer    Family history of breast cancer    Family history of colonic polyps    Family history of prostate cancer    Sepsis ([place]) 11/19/2018   Acute bronchitis 11/19/2018   Hypokalemia 11/19/2018   Abnormal LFTs 11/19/2018   Suspected Covid-19 Virus Infection    Port-A-Cath in place 07/17/2018   Adenocarcinoma of pancreas  ([place]) 06/27/2018   Pancreatic mass    Tobacco use    Direct hyperbilirubinemia    Jaundice 06/07/2018    Past Surgical History:  Procedure Laterality Date   BILIARY STENT PLACEMENT  06/10/2018   Procedure: BILIARY STENT PLACEMENT;  Surgeon: Ronnette Juniper, MD;  Location: [facility];  Service: Gastroenterology;;   CHOLECYSTECTOMY N/A 11/10/2018   Procedure: Laparoscopic Cholecystectomy;  Surgeon: Stark Klein, MD;  Location: [facility];  Service: General;  Laterality: N/A;   DIAGNOSTIC LAPAROSCOPIC LIVER BIOPSY N/A 11/10/2018   Procedure: Diagnostic Laparoscopic Liver Biopsy;  Surgeon: Stark Klein, MD;  Location: [facility];  Service: General;  Laterality: N/A;   ERCP N/A 06/10/2018   Procedure: ENDOSCOPIC RETROGRADE CHOLANGIOPANCREATOGRAPHY (ERCP);  Surgeon: Ronnette Juniper, MD;  Location: [facility];  Service: Gastroenterology;  Laterality: N/A;   ESOPHAGOGASTRODUODENOSCOPY (EGD) WITH PROPOFOL N/A 06/10/2018   Procedure: ESOPHAGOGASTRODUODENOSCOPY (EGD) WITH PROPOFOL;  Surgeon: Ronnette Juniper, MD;  Location: [facility];  Service: Gastroenterology;  Laterality: N/A;   FINE NEEDLE ASPIRATION  06/10/2018   Procedure: FINE NEEDLE ASPIRATION (FNA) LINEAR;  Surgeon: Ronnette Juniper, MD;  Location: [facility] ENDOSCOPY;  Service: Gastroenterology;;   IR RADIOLOGIST EVAL & MGMT  12/10/2018   PORT-A-CATH REMOVAL N/A 11/10/2018   Procedure: REMOVAL PORT-A-CATH;  Surgeon: Stark Klein, MD;  Location: [facility];  Service: General;  Laterality: N/A;   PORTACATH PLACEMENT N/A 07/02/2018   Procedure: INSERTION PORT-A-CATH;  Surgeon: Stark Klein, MD;  Location: [facility];  Service: General;  Laterality: N/A;   SPHINCTEROTOMY  06/10/2018  Procedure: SPHINCTEROTOMY;  Surgeon: Ronnette Juniper, MD;  Location[facility] END[facility]COPY;  Service: Gastroenterology;;   UPPER ESOPHAGEAL ENDOSCOPIC ULTRASOUND (EUS) N/A 06/10/2018   Procedure: UPPER ESOPHAGEAL ENDOSCOPIC ULTRASOUND (EUS);  Surgeon: Ronnette Juniper, MD;  Location: [facility];  Service:  Gastroenterology;  Laterality: N/A;   VASECTOMY          Home Medications    Prior to Admission medications   Medication Sig Start Date End Date Taking? Authorizing Provider  diphenoxylate-atropine (LOMOTIL) 2.5-0.025 MG tablet Take 2 tablets by mouth 4 (four) times daily as needed for diarrhea or loose stools. Patient not taking: Reported on 12/26/2018 06/27/18   Alla Feeling, NP  docusate sodium (COLACE) 100 MG capsule Take 200 mg by mouth daily as needed for mild constipation.    [provider]  esomeprazole (NEXIUM) 20 MG capsule Take 20 mg by mouth daily after breakfast.     [provider]  folic acid (FOLVITE) 1 MG tablet Take 1 tablet (1 mg total) by mouth daily after breakfast. 12/08/18   [name], MD  magic mouthwash w/lidocaine SOLN Take 5 mLs by mouth 4 (four) times daily as needed for mouth pain. 08/22/18   Tanner, Lyndon Code., PA-C  ondansetron (ZOFRAN) 8 MG tablet Take 1 tablet (8 mg total) by mouth every 8 (eight) hours as needed for nausea or vomiting. 12/22/18   Alla Feeling, NP  polyethylene glycol (MIRALAX / GLYCOLAX) 17 g packet Take 17 g by mouth 2 (two) times daily.    [provider]  prochlorperazine (COMPAZINE) 10 MG tablet Take 1 tablet (10 mg total) by mouth every 6 (six) hours as needed (NAUSEA). 01/19/19   [name], MD  rivaroxaban (XARELTO) 20 MG TABS tablet Take 1 tablet (20 mg total) by mouth daily after breakfast. 01/08/19   [name], MD  simethicone (MYLICON) 423 MG chewable tablet Chew 250 mg by mouth every 6 (six) hours as needed for flatulence.     [provider]  Syringe, Disposable, (10-12CC SYRINGE) 12 ML MISC 10 mLs by Does not apply route as needed. 12/04/18   Alla Feeling, NP  traMADol (ULTRAM) 50 MG tablet Take 50 mg by mouth every 6 (six) hours as needed.    [provider]    Family History Family History  Problem Relation Age of Onset   Stroke Mother    Hypertension Mother    CAD Father         CABG   Pancreatic cancer Paternal Grandfather    Breast cancer Maternal Aunt    Lymphoma Paternal Uncle    Other Maternal Uncle        [name]   Stroke Maternal Grandmother    Prostate cancer Cousin        pat first cousin, dx between 43-60    Social History Social History   Tobacco Use   Smoking status: Current Every Day Smoker    Packs/day: 0.25    Years: 30.00    Pack years: 7.50   Smokeless tobacco: Never Used   Tobacco comment: cut back considerably  Substance Use Topics   Alcohol use: Yes    Comment: 6-7 beers Q week   Drug use: Never     Allergies   Contrast media [iodinated diagnostic agents] and Iohexol   Review of Systems Review of Systems  Constitutional: Negative for chills, diaphoresis and fever.  Respiratory: Positive for shortness of breath. Negative for chest tightness.   Cardiovascular: Negative for  chest pain and leg swelling.  Gastrointestinal: Positive for abdominal distention, abdominal pain, constipation and nausea. Negative for vomiting.  Genitourinary: Negative for decreased urine volume, difficulty urinating and dysuria.  Skin: Negative for rash and wound.  Allergic/Immunologic: Positive for immunocompromised state.  Neurological: Negative for weakness.  Hematological: Negative for adenopathy.  Psychiatric/Behavioral: Negative for confusion.  All other systems reviewed and are negative.    Physical Exam Updated Vital Signs BP 137/86 (BP Location: Right Arm)    Pulse 78    Temp (!) 97.5 F (36.4 C) (Oral)    Resp 19    SpO2 98%   Physical Exam Vitals signs and nursing note reviewed.  Constitutional:      General: He is not in acute distress.    Appearance: He is well-developed. He is not diaphoretic.  HENT:     Head: Normocephalic and atraumatic.  Cardiovascular:     Rate and Rhythm: Normal rate and regular rhythm.     Pulses: Normal pulses.     Heart sounds: Normal heart sounds.  Pulmonary:     Effort:  Pulmonary effort is normal.     Breath sounds: Normal breath sounds.  Abdominal:     General: There is distension.     Palpations: There is hepatomegaly. There is no fluid wave.     Tenderness: There is abdominal tenderness.  Skin:    General: Skin is warm and dry.  Neurological:     Mental Status: He is alert and oriented to person, place, and time.  Psychiatric:        Behavior: Behavior normal.      ED Treatments / Results  Labs (all labs ordered are listed, but only abnormal results are displayed) Labs Reviewed  CBC WITH DIFFERENTIAL/PLATELET  COMPREHENSIVE METABOLIC PANEL  LIPASE, BLOOD  URINALYSIS, ROUTINE W REFLEX MICROSCOPIC    EKG None  Radiology US Paracentesis  Result Date: 01/30/2019 INDICATION: Pancreatic cancer with ascites. Request for diagnostic and therapeutic paracentesis. EXAM: ULTRASOUND GUIDED PARACENTESIS MEDICATIONS: 1% lidocaine 10 mL COMPLICATIONS: None immediate. PROCEDURE: Informed written consent was obtained from the patient after a discussion of the risks, benefits and alternatives to treatment. A timeout was performed prior to the initiation of the procedure. Initial ultrasound scanning demonstrates a moderate amount of ascites within the right lower abdominal quadrant. The right lower abdomen was prepped and draped in the usual sterile fashion. 1% lidocaine with epinephrine was used for local anesthesia. Following this, a 19 gauge, 7-cm, Yueh catheter was introduced. An ultrasound image was saved for documentation purposes. The paracentesis was performed. The catheter was removed and a dressing was applied. The patient tolerated the procedure well without immediate post procedural complication. FINDINGS: A total of approximately 2.5 L of clear yellow fluid was removed. Samples were sent to the laboratory as requested by the clinical team. IMPRESSION: Successful ultrasound-guided paracentesis yielding 2.5 liters of peritoneal fluid. Read by: Gareth Eagle  PA-C Electronically Signed   By: Jerilynn Mages.  Shick M.D.   On: 01/30/2019 16:46    Procedures Procedures (including critical care time)  Medications Ordered in ED Medications - No data to display   Initial Impression / Assessment and Plan / ED Course  I have reviewed the triage vital signs and the nursing notes.  Pertinent labs & imaging results that were available during my care of the patient were reviewed by me and considered in my medical decision making (see chart for details).  Clinical Course as of Jan 31 1609  Sun Feb 01, 2019  1605 58yo male with history of metastatic pancreatic cancer on palliative chemo presents with complaint of ongoing abdominal distention and constipation. Patient was seen for same in clinic 2 days ago, had 2.5L peritoneal fluid removed (pathology pending). On exam abdomen is distended, no fluid wave. Denies fevers, vomiting. Review of lab work shows normal lipase, normal UA. CMP without significant findings, CBC with normal WBC, normal neutrophils. Patient is afebrile.  CT shows: Increased ascites.   Increased omental infiltration with scattered areas of thickening  and slight nodularity a peritoneal surfaces in the pelvis,  consistent with peritoneal carcinomatosis.   Mild colonic distention proximally without discrete obstruction  though the sigmoid colon and rectum appear decompressed and there  appear to be adjacent pelvic tumor deposits.    [LM]  1609 Results reviewed with Dr. Wilson Singer, ER attending, offered patient enema in the ER, patient prefers to go home to try this. Recommend call his oncologist tomorrow for follow up, return to ER for any worsening or concerning symptoms.    [LM]    Clinical Course User Index [LM] Tacy Learn, PA-C      Final Clinical Impressions(s) / ED Diagnoses   Final diagnoses:  None    ED Discharge Orders    None       Roque Lias 02/01/19 1610    Virgel Manifold, MD 02/01/19 [phone]

## 2019-02-01 NOTE — Discharge Instructions (Addendum)
Continue home measures for constipation. Call your oncologist tomorrow for recheck, return to the ER for worsening or concerning symptoms.

## 2019-02-01 NOTE — ED Triage Notes (Signed)
He reports having recently undergoing a paracentesis. He is here today with inability to have a b/m, or to pass gas x 2-3 days. He also reports feeling a "rumbling in my stomach". He is ambulatory and in no distress.

## 2019-02-01 NOTE — ED Notes (Signed)
Patient transported to CT 

## 2019-02-01 NOTE — ED Notes (Signed)
Patient provided with urinal and made aware urine sample is needed. 

## 2019-02-02 ENCOUNTER — Inpatient Hospital Stay (HOSPITAL_BASED_OUTPATIENT_CLINIC_OR_DEPARTMENT_OTHER): Payer: Managed Care, Other (non HMO) | Admitting: Medical

## 2019-02-02 ENCOUNTER — Other Ambulatory Visit: Payer: Self-pay

## 2019-02-02 ENCOUNTER — Ambulatory Visit (HOSPITAL_COMMUNITY)
Admission: RE | Admit: 2019-02-02 | Discharge: 2019-02-02 | Disposition: A | Discharge status: Home / Self Care | Payer: Managed Care, Other (non HMO) | Source: Ambulatory Visit | Attending: Medical | Admitting: Medical

## 2019-02-02 VITALS — BP 132/99 | HR 102 | Temp 97.7°F | Resp 18 | Ht 72.0 in | Wt 186.4 lb

## 2019-02-02 DIAGNOSIS — C257 Malignant neoplasm of other parts of pancreas: Secondary | ICD-10-CM

## 2019-02-02 DIAGNOSIS — Z86718 Personal history of other venous thrombosis and embolism: Secondary | ICD-10-CM

## 2019-02-02 DIAGNOSIS — C259 Malignant neoplasm of pancreas, unspecified: Secondary | ICD-10-CM

## 2019-02-02 DIAGNOSIS — R112 Nausea with vomiting, unspecified: Secondary | ICD-10-CM

## 2019-02-02 DIAGNOSIS — C787 Secondary malignant neoplasm of liver and intrahepatic bile duct: Secondary | ICD-10-CM | POA: Diagnosis not present

## 2019-02-02 DIAGNOSIS — R160 Hepatomegaly, not elsewhere classified: Secondary | ICD-10-CM

## 2019-02-02 DIAGNOSIS — I700 Atherosclerosis of aorta: Secondary | ICD-10-CM

## 2019-02-02 DIAGNOSIS — R918 Other nonspecific abnormal finding of lung field: Secondary | ICD-10-CM | POA: Diagnosis not present

## 2019-02-02 DIAGNOSIS — G620 Drug-induced polyneuropathy: Secondary | ICD-10-CM

## 2019-02-02 DIAGNOSIS — R180 Malignant ascites: Secondary | ICD-10-CM

## 2019-02-02 DIAGNOSIS — Z7901 Long term (current) use of anticoagulants: Secondary | ICD-10-CM

## 2019-02-02 DIAGNOSIS — T451X5S Adverse effect of antineoplastic and immunosuppressive drugs, sequela: Secondary | ICD-10-CM

## 2019-02-02 DIAGNOSIS — R197 Diarrhea, unspecified: Secondary | ICD-10-CM

## 2019-02-02 DIAGNOSIS — F17210 Nicotine dependence, cigarettes, uncomplicated: Secondary | ICD-10-CM

## 2019-02-02 DIAGNOSIS — K5900 Constipation, unspecified: Secondary | ICD-10-CM

## 2019-02-02 DIAGNOSIS — R5383 Other fatigue: Secondary | ICD-10-CM

## 2019-02-02 DIAGNOSIS — Z79899 Other long term (current) drug therapy: Secondary | ICD-10-CM

## 2019-02-02 MED ORDER — LACTULOSE 10 GM/15ML PO SOLN: 30.0000 g | Freq: Three times a day (TID) | ORAL | 2 refills | Status: AC

## 2019-02-02 MED ORDER — LIDOCAINE HCL 1 % IJ SOLN
INTRAMUSCULAR | Status: AC
  Filled 2019-02-02: qty 20

## 2019-02-02 NOTE — Progress Notes (Addendum)
[facility] Progress Note   Ernest Wu [id number] [birth year] 58 y.o.  [name] is managed by Dr. [name]  Actively treated with chemotherapy/immunotherapy/hormonal therapy: yes  Current therapy: Gemzar  Last treated: 01/19/2019 (cycle 2, day 1)  Next scheduled appointment with provider: 02/09/2019  Assessment: Plan:    Metastatic adenocarcinoma of the pancreas: [name] is status post cycle 2, day 1 of gemcitabine which was dosed on 01/19/2019.  He is scheduled to return for follow-up on 02/09/2019.  Ascites: Patient was referred for a paracentesis 01/30/2019 with cytology pending.  He was referred for a repeat paracentesis today.  Constipation: The patient was given a prescription for lactulose 20 mg 3 times daily as needed.  Please see After Visit Summary for patient specific instructions.  Future Appointments  Date Time Provider [place]  02/09/2019  8:45 AM [facility] LAB 6 [facility] None  02/09/2019  9:15 AM Alla Feeling, NP [facility] None  02/09/2019 10:00 AM [facility] INFUSION [facility] None  02/16/2019 11:45 AM [facility] LAB 2 [facility] None  02/16/2019 12:15 PM Alla Feeling, NP [facility] None  02/16/2019  1:00 PM [facility] INFUSION [facility] None    Orders Placed This Encounter  Procedures   US Paracentesis       Subjective:   Patient ID:  Ernest Wu is a 58 y.o. (DOB 03-Dec-1960) male.  Chief Complaint:  Chief Complaint  Patient presents with   Follow-up    from ED for constipation    HPI [name]  is a 58 year old male with a history of a metastatic pancreatic cancer who is managed by Dr. Burr Medico and is status post cycle 2, day 1 of gemcitabine which was dosed on 01/19/2019.  He was last seen on 01/30/2019.  At that time he reported that he was having increased abdominal distention with an almost 5 pound weight increase since his visit on 01/26/2019.  He was having pain due to the  distention of his abdomen and shortness of breath as he is unable to take a deep breath because of his abdominal distention.    He was referred for therapeutic paracentesis with 2.5 L of ascites removed from his abdomen.  This was sent for cytology.  He presented to the emergency room on 02/01/2019 with a report that he felt that his abdomen potentially had fluid again.  He reported that his abdomen was distended, felt tight and that he was having shortness of breath.  He was unable to pass significant stool or gas despite taking MiraLAX, stool softeners, prune juice, milk of magnesia, and glycerin suppositories.  He was referred for CT scan of his abdomen which showed:  Increased ascites.  Increased omental infiltration with scattered areas of thickening and slight nodularity a peritoneal surfaces in the pelvis, consistent with peritoneal carcinomatosis.  Mild colonic distention proximally without discrete obstruction though the sigmoid colon and rectum appear decompressed and there appear to be adjacent pelvic tumor deposits.  Prior CBD stenting.  He presents to the clinic today with continued abdominal distention.  He has not had a bowel movement despite his use of multiple antibiotics.  He is agreeable for referral for consideration of a repeat paracentesis today.  Medications: I have reviewed the patient's current medications.  Allergies:  Allergies  Allergen Reactions   Contrast Media [Iodinated Diagnostic Agents] Other (See Comments)    Burning sensation [SEE CONTRAST MEDIA]   Iohexol Anaphylaxis, Itching and Rash    Past Medical History:  Diagnosis Date   Anxiety    Family history of breast cancer    Family history of colonic polyps    Family history of pancreatic cancer    Family history of prostate cancer    GERD (gastroesophageal reflux disease)    pancreatic ca 06/2018   Pancreatic    Past Surgical History:  Procedure Laterality Date   BILIARY STENT  PLACEMENT  06/10/2018   Procedure: BILIARY STENT PLACEMENT;  Surgeon: Ronnette Juniper, MD;  Location: [place];  Service: Gastroenterology;;   CHOLECYSTECTOMY N/A 11/10/2018   Procedure: Laparoscopic Cholecystectomy;  Surgeon: Stark Klein, MD;  Location: [place];  Service: General;  Laterality: N/A;   DIAGNOSTIC LAPAROSCOPIC LIVER BIOPSY N/A 11/10/2018   Procedure: Diagnostic Laparoscopic Liver Biopsy;  Surgeon: Stark Klein, MD;  Location: [place];  Service: General;  Laterality: N/A;   ERCP N/A 06/10/2018   Procedure: ENDOSCOPIC RETROGRADE CHOLANGIOPANCREATOGRAPHY (ERCP);  Surgeon: Ronnette Juniper, MD;  Location: [place];  Service: Gastroenterology;  Laterality: N/A;   ESOPHAGOGASTRODUODENOSCOPY (EGD) WITH PROPOFOL N/A 06/10/2018   Procedure: ESOPHAGOGASTRODUODENOSCOPY (EGD) WITH PROPOFOL;  Surgeon: Ronnette Juniper, MD;  Location: [place];  Service: Gastroenterology;  Laterality: N/A;   FINE NEEDLE ASPIRATION  06/10/2018   Procedure: FINE NEEDLE ASPIRATION (FNA) LINEAR;  Surgeon: Ronnette Juniper, MD;  Location[facility] END[facility];  Service: Gastroenterology;;   IR RADIOLOGIST EVAL & MGMT  12/10/2018   PORT-A-CATH REMOVAL N/A 11/10/2018   Procedure: REMOVAL PORT-A-CATH;  Surgeon: Stark Klein, MD;  Location: [place];  Service: General;  Laterality: N/A;   PORTACATH PLACEMENT N/A 07/02/2018   Procedure: INSERTION PORT-A-CATH;  Surgeon: Stark Klein, MD;  Location: [place];  Service: General;  Laterality: N/A;   SPHINCTEROTOMY  06/10/2018   Procedure: SPHINCTEROTOMY;  Surgeon: Ronnette Juniper, MD;  Location: [facility] ENDOSCOPY;  Service: Gastroenterology;;   UPPER ESOPHAGEAL ENDOSCOPIC ULTRASOUND (EUS) N/A 06/10/2018   Procedure: UPPER ESOPHAGEAL ENDOSCOPIC ULTRASOUND (EUS);  Surgeon: Ronnette Juniper, MD;  Location: [place];  Service: Gastroenterology;  Laterality: N/A;   VASECTOMY      Family History  Problem Relation Age of Onset   Stroke Mother    Hypertension Mother    CAD Father        CABG    Pancreatic cancer Paternal Grandfather    Breast cancer Maternal Aunt    Lymphoma Paternal Uncle    Other Maternal Uncle        [name]   Stroke Maternal Grandmother    Prostate cancer Cousin        pat first cousin, dx between 25-60    Social History   Socioeconomic History   Marital status: Married    Spouse name: Not on file   Number of children: 5   Years of education: Not on file   Highest education level: Not on file  Occupational History   Not on file  Social Needs   Financial resource strain: Not on file   Food insecurity    Worry: Not on file    Inability: Not on file   Transportation needs    Medical: Not on file    Non-medical: Not on file  Tobacco Use   Smoking status: Current Every Day Smoker    Packs/day: 0.25    Years: 30.00    Pack years: 7.50   Smokeless tobacco: Never Used   Tobacco comment: cut back considerably  Substance and Sexual Activity   Alcohol use: Yes    Comment: 6-7 beers Q week   Drug  use: Never   Sexual activity: Not on file  Lifestyle   Physical activity    Days per week: Not on file    Minutes per session: Not on file   Stress: Not on file  Relationships   Social connections    Talks on phone: Not on file    Gets together: Not on file    Attends religious service: Not on file    Active member of club or organization: Not on file    Attends meetings of clubs or organizations: Not on file    Relationship status: Not on file   Intimate partner violence    Fear of current or ex partner: Not on file    Emotionally abused: Not on file    Physically abused: Not on file    Forced sexual activity: Not on file  Other Topics Concern   Not on file  Social History Narrative   Not on file    Past Medical History, Surgical history, Social history, and Family history were reviewed and updated as appropriate.   Please see review of systems for further details on the patient's review from today.   Review  of Systems:  Review of Systems  Constitutional: Positive for appetite change. Negative for activity change, chills, diaphoresis and fever.  HENT: Negative for trouble swallowing.   Respiratory: Positive for shortness of breath. Negative for cough and chest tightness.   Cardiovascular: Negative for chest pain, palpitations and leg swelling.  Gastrointestinal: Positive for abdominal distention and abdominal pain. Negative for constipation, diarrhea, nausea and vomiting.    Objective:   Physical Exam:  BP (!) 132/99 (BP Location: Left Arm, Patient Position: Sitting) Comment: Notified Nurse of Bp   Pulse (!) 102    Temp 97.7 F (36.5 C) (Temporal)    Resp 18    Ht 6' (1.829 m)    Wt 186 lb 6.4 oz (84.6 kg)    SpO2 97%    BMI 25.28 kg/m  ECOG: 1  Physical Exam Constitutional:      General: He is not in acute distress.    Appearance: He is not diaphoretic.  HENT:     Head: Normocephalic and atraumatic.  Cardiovascular:     Rate and Rhythm: Normal rate and regular rhythm.     Heart sounds: Normal heart sounds. No murmur. No friction rub. No gallop.   Pulmonary:     Effort: Pulmonary effort is normal. No respiratory distress.     Breath sounds: Normal breath sounds. No wheezing or rales.  Abdominal:     General: Bowel sounds are normal. There is distension.     Tenderness: There is no guarding.  Skin:    General: Skin is warm and dry.     Findings: No erythema or rash.  Neurological:     Mental Status: He is alert.     Coordination: Coordination normal.     Gait: Gait normal.  Psychiatric:        Mood and Affect: Mood normal.        Behavior: Behavior normal.        Thought Content: Thought content normal.        Judgment: Judgment normal.     Lab Review:     Component Value Date/Time   NA 135 02/01/2019 1320   K 4.4 02/01/2019 1320   CL 99 02/01/2019 1320   CO2 27 02/01/2019 1320   GLUCOSE 141 (H) 02/01/2019 1320   BUN 7 02/01/2019 1320  CREATININE 0.67 02/01/2019  1320   CREATININE 0.79 01/26/2019 1020   CALCIUM 8.7 (L) 02/01/2019 1320   PROT 6.7 02/01/2019 1320   ALBUMIN 3.1 (L) 02/01/2019 1320   AST 24 02/01/2019 1320   AST 36 01/26/2019 1020   ALT 39 02/01/2019 1320   ALT 76 (H) 01/26/2019 1020   ALKPHOS 116 02/01/2019 1320   BILITOT 0.1 (L) 02/01/2019 1320   BILITOT <0.2 (L) 01/26/2019 1020   GFRNONAA >60 02/01/2019 1320   GFRNONAA >60 01/26/2019 1020   GFRAA >60 02/01/2019 1320   GFRAA >60 01/26/2019 1020       Component Value Date/Time   WBC 8.8 02/01/2019 1320   RBC 4.42 02/01/2019 1320   HGB 12.2 (L) 02/01/2019 1320   HGB 11.2 (L) 01/26/2019 1020   HGB 10.5 (L) 11/19/2018 0236   HCT 39.5 02/01/2019 1320   PLT 330 02/01/2019 1320   PLT 319 01/26/2019 1020   MCV 89.4 02/01/2019 1320   MCH 27.6 02/01/2019 1320   MCHC 30.9 02/01/2019 1320   RDW 15.8 (H) 02/01/2019 1320   LYMPHSABS 1.0 02/01/2019 1320   MONOABS 1.0 02/01/2019 1320   EOSABS 0.1 02/01/2019 1320   BASOSABS 0.0 02/01/2019 1320   -------------------------------  Imaging from last 24 hours (if applicable):  Radiology interpretation: Ct Abdomen Pelvis Wo Contrast  Result Date: 02/01/2019 CLINICAL DATA:  Abdominal distension, constipation, nausea, symptoms worse for 1 week, recent paracentesis, history pancreatic cancer, smoker EXAM: CT ABDOMEN AND PELVIS WITHOUT CONTRAST TECHNIQUE: Multidetector CT imaging of the abdomen and pelvis was performed following the standard protocol without IV contrast. Sagittal and coronal MPR images reconstructed from axial data set. Patient drank dilute oral contrast for exam COMPARISON:  12/26/2018 FINDINGS: Lower chest: Ill-defined nodular density LEFT lower lobe 4 mm diameter image 15 unchanged. Additional tiny subpleural density LEFT lower lobe laterally adjacent to major fissure image 14 unchanged. Hepatobiliary: Pneumobilia consistent likely due to noted distal CBD wall stent. Gallbladder surgically absent. No focal hepatic mass  lesions. Pancreas: Atrophic pancreas. Fullness of pancreatic head versus body though no discrete mass or pancreatic ductal dilatation is seen. Spleen: Normal appearance Adrenals/Urinary Tract: Adrenal glands normal appearance. Kidneys, ureters, and bladder normal appearance Stomach/Bowel: Distended stomach. Diffuse colonic distension attending stool, with contrast in the RIGHT colon. Cecum 8.7 cm transverse. Rectosigmoid colon nondistended. Nonobstructed small bowel loops, oral contrast in RIGHT colon. Appendix not visualized. No definite bowel wall thickening. Vascular/Lymphatic: Atherosclerotic calcifications Reproductive: Mild prostatic enlargement. Seminal vesicles unremarkable. Other: Scattered ascites. Diffuse omental stranding increased from previous exam. Areas of thickening and slight nodularity at peritoneal surfaces. Findings are consistent with peritoneal carcinomatosis. BILATERAL inguinal hernias containing fat. No free air. Tiny umbilical hernia containing fat. Interloop fluid. Musculoskeletal: No acute osseous lesions. IMPRESSION: Increased ascites. Increased omental infiltration with scattered areas of thickening and slight nodularity a peritoneal surfaces in the pelvis, consistent with peritoneal carcinomatosis. Mild colonic distention proximally without discrete obstruction though the sigmoid colon and rectum appear decompressed and there appear to be adjacent pelvic tumor deposits. Prior CBD stenting. Electronically Signed   By: Lavonia Dana M.D.   On: 02/01/2019 15:28   Ct Chest Wo Contrast  Result Date: 01/09/2019 CLINICAL DATA:  Pancreatic cancer diagnosed June 07, 2018. Chemotherapy ongoing. EXAM: CT CHEST WITHOUT CONTRAST TECHNIQUE: Multidetector CT imaging of the chest was performed following the standard protocol without IV contrast. COMPARISON:  CT 12/10/2018 FINDINGS: Cardiovascular: Ascending thoracic aorta is mildly aneurysmal at 42 mm. Coronary artery calcification and aortic  atherosclerotic calcification. Mediastinum/Nodes: No axillary supraclavicular adenopathy. No mediastinal hilar adenopathy. No pericardial effusion. Esophagus normal Lungs/Pleura: Biapical pulmonary parenchymal thickening. Rounded 4 mm nodule in the RIGHT lower lobe (image 127/5) not identified on CT 11 27 19. Subpleural nodule in the more superior RIGHT lower lobe measuring 3 mm (85/5) is also not identified. Third small nodule in the RIGHT lower lobe measuring 3 mm on image 75 is also new. Less defined nodule in the LEFT lower lobe medially measuring 4 mm (image 118) is present on comparison exam. Upper Abdomen: Limited view of the upper abdomen demonstrates pneumobilia as expected. No inflammation or infection identified Musculoskeletal: No aggressive osseous lesion. IMPRESSION: 1. Three small (less than 5 mm) pulmonary nodules in the RIGHT lower lobe are not identified on comparison CT from 07/02/2018 and therefore concerning for metastatic nodules. 2. Mild aneurysmal dilatation of the ascending thoracic aorta. Recommend annual imaging followup by CTA or MRA. This recommendation follows 2010 ACCF/AHA/AATS/ACR/ASA/SCA/SCAI/SIR/STS/SVM Guidelines for the Diagnosis and Management of Patients with Thoracic Aortic Disease. Circulation. 2010; 121: E268-T419. Aortic aneurysm NOS (ICD10-I71.9) Electronically Signed   By: Suzy Bouchard M.D.   On: 01/09/2019 09:52   US Paracentesis  Result Date: 01/30/2019 INDICATION: Pancreatic cancer with ascites. Request for diagnostic and therapeutic paracentesis. EXAM: ULTRASOUND GUIDED PARACENTESIS MEDICATIONS: 1% lidocaine 10 mL COMPLICATIONS: None immediate. PROCEDURE: Informed written consent was obtained from the patient after a discussion of the risks, benefits and alternatives to treatment. A timeout was performed prior to the initiation of the procedure. Initial ultrasound scanning demonstrates a moderate amount of ascites within the right lower abdominal quadrant. The  right lower abdomen was prepped and draped in the usual sterile fashion. 1% lidocaine with epinephrine was used for local anesthesia. Following this, a 19 gauge, 7-cm, Yueh catheter was introduced. An ultrasound image was saved for documentation purposes. The paracentesis was performed. The catheter was removed and a dressing was applied. The patient tolerated the procedure well without immediate post procedural complication. FINDINGS: A total of approximately 2.5 L of clear yellow fluid was removed. Samples were sent to the laboratory as requested by the clinical team. IMPRESSION: Successful ultrasound-guided paracentesis yielding 2.5 liters of peritoneal fluid. Read by: Gareth Eagle PA-C Electronically Signed   By: Jerilynn Mages.  Shick M.D.   On: 01/30/2019 16:46        This patient was seen with Dr. Burr Medico with my treatment plan reviewed with her. She expressed agreement with my medical management of this patient.  Addendum  I have seen the patient, examined him. I agree with the assessment and and plan and have edited the notes.   Mr. Stankovich is doing poorly, I suspect his abdominal discomfort are more related to cancer progression than ascites.  Cytology from paracentesis was positive cells.  He will have repeated paracentesis today, and I will talk to IR about Pleurx placement. We also discussed constipation management, will call in lactulose for him to try.  Due to his worsening performance status, anorexia, probably not a candidate for more chemotherapy.  I discussed palliative care and hospice, his wife is open to hospice, [place] needs more time to think about it.  They will call me if they decide to have hospice involved. He has a f/u appointment with NP Lacie next Monday.   [name]  02/02/2019

## 2019-02-02 NOTE — Progress Notes (Signed)
Pt seen by PA Van only, no RN assessment at this time.  PA aware. 

## 2019-02-02 NOTE — Procedures (Signed)
Ultrasound-guided therapeutic paracentesis performed yielding 1.9 liters of slightly hazy, yellow fluid. No immediate complications. EBL none.

## 2019-02-02 NOTE — Patient Instructions (Signed)
Paracentesis, Care After This sheet gives you information about how to care for yourself after your procedure. Your health care provider may also give you more specific instructions. If you have problems or questions, contact your health care provider. What can I expect after the procedure? After the procedure, it is common to have a small amount of clear fluid coming from the puncture site. Follow these instructions at home: Puncture site care   Follow instructions from your health care provider about how to take care of your puncture site. Make sure you: ? Wash your hands with soap and water before and after you change your bandage (dressing). If soap and water are not available, use hand sanitizer. ? Change your dressing as told by your health care provider.  Check your puncture area every day signs of infection. Check for: ? Redness, swelling, or pain. ? More fluid or blood. ? Warmth. ? Pus or a bad smell. General instructions  Return to your normal activities as told by your health care provider. Ask your health care provider what activities are safe for you.  Take over-the-counter and prescription medicines only as told by your health care provider.  Do not take baths, swim, or use a hot tub until your health care provider approves. Ask your health care provider if you may take showers. You may only be allowed to take sponge baths.  Keep all follow-up visits as told by your health care provider. This is important. Contact a health care provider if:  You have redness, swelling, or pain at your puncture site.  You have more fluid or blood coming from your puncture site.  Your puncture site feels warm to the touch.  You have pus or a bad smell coming from your puncture site.  You have a fever. Get help right away if:  You have chest pain or shortness of breath.  You develop increasing pain, discomfort, or swelling in your abdomen.  You feel dizzy or light-headed or you  faint. Summary  After the procedure, it is common to have a small amount of clear fluid coming from the puncture site.  Follow instructions from your health care provider about how to take care of your puncture site.  Check your puncture area every day signs of infection.  Keep all follow-up visits as told by your health care provider. This information is not intended to replace advice given to you by your health care provider. Make sure you discuss any questions you have with your health care provider. Document Released: 12/07/2014 Document Revised: 06/24/2018 Document Reviewed: 05/13/2018 Elsevier Patient Education  2020 [employer].

## 2019-02-03 ENCOUNTER — Telehealth: Payer: Self-pay | Admitting: *Deleted

## 2019-02-03 ENCOUNTER — Telehealth: Payer: Self-pay | Admitting: Medical

## 2019-02-03 NOTE — Telephone Encounter (Signed)
Received vm call from pt's wife this am asking for return call.   Called & spoke with wife who states pt had fluid removed yest @ 2 L.& he is swelled up again this am.  He is uncomfortable, pain is increased.  He is trying to take the laxative that Dr Burr Medico recommended & he is trying to eat.  She mentioned Hospice was recommended by Dr Burr Medico & wanted to know what to do.  Pt doesn't have catheter for drainage of fluid.  Discussed with DR Burr Medico & she will check with radiology.

## 2019-02-03 NOTE — Telephone Encounter (Signed)
No los per 6/29. °

## 2019-02-04 ENCOUNTER — Emergency Department (HOSPITAL_COMMUNITY): Payer: Managed Care, Other (non HMO)

## 2019-02-04 ENCOUNTER — Telehealth: Payer: Self-pay

## 2019-02-04 ENCOUNTER — Inpatient Hospital Stay (HOSPITAL_COMMUNITY)
Admission: EM | Admit: 2019-02-04 | Discharge: 2019-02-11 | DRG: 375 | Disposition: A | Discharge status: Hospice | Payer: Managed Care, Other (non HMO) | Attending: Internal Medicine | Admitting: Internal Medicine

## 2019-02-04 ENCOUNTER — Other Ambulatory Visit: Payer: Self-pay

## 2019-02-04 ENCOUNTER — Other Ambulatory Visit: Payer: Self-pay | Admitting: Radiology

## 2019-02-04 ENCOUNTER — Encounter (HOSPITAL_COMMUNITY): Payer: Self-pay

## 2019-02-04 ENCOUNTER — Telehealth: Payer: Self-pay | Admitting: Hematology

## 2019-02-04 ENCOUNTER — Other Ambulatory Visit: Payer: Self-pay | Admitting: Hematology

## 2019-02-04 DIAGNOSIS — Z800 Family history of malignant neoplasm of digestive organs: Secondary | ICD-10-CM

## 2019-02-04 DIAGNOSIS — R180 Malignant ascites: Secondary | ICD-10-CM | POA: Diagnosis present

## 2019-02-04 DIAGNOSIS — K56609 Unspecified intestinal obstruction, unspecified as to partial versus complete obstruction: Secondary | ICD-10-CM

## 2019-02-04 DIAGNOSIS — D6481 Anemia due to antineoplastic chemotherapy: Secondary | ICD-10-CM | POA: Diagnosis present

## 2019-02-04 DIAGNOSIS — E860 Dehydration: Secondary | ICD-10-CM | POA: Diagnosis present

## 2019-02-04 DIAGNOSIS — G4700 Insomnia, unspecified: Secondary | ICD-10-CM | POA: Diagnosis present

## 2019-02-04 DIAGNOSIS — F419 Anxiety disorder, unspecified: Secondary | ICD-10-CM | POA: Diagnosis present

## 2019-02-04 DIAGNOSIS — Z86718 Personal history of other venous thrombosis and embolism: Secondary | ICD-10-CM | POA: Diagnosis not present

## 2019-02-04 DIAGNOSIS — K8010 Calculus of gallbladder with chronic cholecystitis without obstruction: Secondary | ICD-10-CM | POA: Diagnosis present

## 2019-02-04 DIAGNOSIS — K5900 Constipation, unspecified: Secondary | ICD-10-CM | POA: Diagnosis not present

## 2019-02-04 DIAGNOSIS — C786 Secondary malignant neoplasm of retroperitoneum and peritoneum: Principal | ICD-10-CM | POA: Diagnosis present

## 2019-02-04 DIAGNOSIS — C787 Secondary malignant neoplasm of liver and intrahepatic bile duct: Secondary | ICD-10-CM | POA: Diagnosis present

## 2019-02-04 DIAGNOSIS — K56699 Other intestinal obstruction unspecified as to partial versus complete obstruction: Secondary | ICD-10-CM | POA: Diagnosis not present

## 2019-02-04 DIAGNOSIS — R112 Nausea with vomiting, unspecified: Secondary | ICD-10-CM

## 2019-02-04 DIAGNOSIS — R17 Unspecified jaundice: Secondary | ICD-10-CM | POA: Diagnosis present

## 2019-02-04 DIAGNOSIS — Z9049 Acquired absence of other specified parts of digestive tract: Secondary | ICD-10-CM

## 2019-02-04 DIAGNOSIS — Z1159 Encounter for screening for other viral diseases: Secondary | ICD-10-CM

## 2019-02-04 DIAGNOSIS — R1084 Generalized abdominal pain: Secondary | ICD-10-CM | POA: Diagnosis not present

## 2019-02-04 DIAGNOSIS — F17210 Nicotine dependence, cigarettes, uncomplicated: Secondary | ICD-10-CM | POA: Diagnosis present

## 2019-02-04 DIAGNOSIS — Z66 Do not resuscitate: Secondary | ICD-10-CM | POA: Diagnosis present

## 2019-02-04 DIAGNOSIS — K219 Gastro-esophageal reflux disease without esophagitis: Secondary | ICD-10-CM | POA: Diagnosis present

## 2019-02-04 DIAGNOSIS — Z7189 Other specified counseling: Secondary | ICD-10-CM | POA: Diagnosis not present

## 2019-02-04 DIAGNOSIS — C259 Malignant neoplasm of pancreas, unspecified: Secondary | ICD-10-CM

## 2019-02-04 DIAGNOSIS — T451X5A Adverse effect of antineoplastic and immunosuppressive drugs, initial encounter: Secondary | ICD-10-CM | POA: Diagnosis present

## 2019-02-04 DIAGNOSIS — Z7901 Long term (current) use of anticoagulants: Secondary | ICD-10-CM | POA: Diagnosis not present

## 2019-02-04 DIAGNOSIS — K560 Paralytic ileus: Secondary | ICD-10-CM | POA: Diagnosis present

## 2019-02-04 DIAGNOSIS — Z515 Encounter for palliative care: Secondary | ICD-10-CM | POA: Diagnosis not present

## 2019-02-04 DIAGNOSIS — R140 Abdominal distension (gaseous): Secondary | ICD-10-CM

## 2019-02-04 DIAGNOSIS — R188 Other ascites: Secondary | ICD-10-CM

## 2019-02-04 DIAGNOSIS — Z720 Tobacco use: Secondary | ICD-10-CM | POA: Diagnosis not present

## 2019-02-04 DIAGNOSIS — I82B12 Acute embolism and thrombosis of left subclavian vein: Secondary | ICD-10-CM | POA: Diagnosis present

## 2019-02-04 LAB — COMPREHENSIVE METABOLIC PANEL
ALT: 41 U/L (ref 0–44)
AST: 29 U/L (ref 15–41)
Albumin: 2.8 g/dL — ABNORMAL LOW (ref 3.5–5.0)
Alkaline Phosphatase: 100 U/L (ref 38–126)
Anion gap: 12 (ref 5–15)
BUN: 7 mg/dL (ref 6–20)
CO2: 26 mmol/L (ref 22–32)
Calcium: 8.8 mg/dL — ABNORMAL LOW (ref 8.9–10.3)
Chloride: 92 mmol/L — ABNORMAL LOW (ref 98–111)
Creatinine, Ser: 0.81 mg/dL (ref 0.61–1.24)
GFR calc Af Amer: >60 mL/min (ref >60)
GFR calc non Af Amer: >60 mL/min (ref >60)
Glucose, Bld: 127 mg/dL — ABNORMAL HIGH (ref 70–99)
Potassium: 4.4 mmol/L (ref 3.5–5.1)
Sodium: 130 mmol/L — ABNORMAL LOW (ref 135–145)
Total Bilirubin: 0.8 mg/dL (ref 0.3–1.2)
Total Protein: 6.1 g/dL — ABNORMAL LOW (ref 6.5–8.1)

## 2019-02-04 LAB — CBC WITH DIFFERENTIAL/PLATELET
Abs Immature Granulocytes: 0.04 10*3/uL (ref 0.00–0.07)
Basophils Absolute: 0 10*3/uL (ref 0.0–0.1)
Basophils Relative: 0 %
Eosinophils Absolute: 0 10*3/uL (ref 0.0–0.5)
Eosinophils Relative: 0 %
HCT: 39.2 % (ref 39.0–52.0)
Hemoglobin: 12.9 g/dL — ABNORMAL LOW (ref 13.0–17.0)
Immature Granulocytes: 0 %
Lymphocytes Relative: 8 %
Lymphs Abs: 1 10*3/uL (ref 0.7–4.0)
MCH: 27.7 pg (ref 26.0–34.0)
MCHC: 32.9 g/dL (ref 30.0–36.0)
MCV: 84.3 fL (ref 80.0–100.0)
Monocytes Absolute: 1.5 10*3/uL — ABNORMAL HIGH (ref 0.1–1.0)
Monocytes Relative: 12 %
Neutro Abs: 9.7 10*3/uL — ABNORMAL HIGH (ref 1.7–7.7)
Neutrophils Relative %: 80 %
Platelets: 467 10*3/uL — ABNORMAL HIGH (ref 150–400)
RBC: 4.65 MIL/uL (ref 4.22–5.81)
RDW: 15.9 % — ABNORMAL HIGH (ref 11.5–15.5)
WBC: 12.4 10*3/uL — ABNORMAL HIGH (ref 4.0–10.5)
nRBC: 0 % (ref 0.0–0.2)

## 2019-02-04 MED ORDER — SODIUM CHLORIDE 0.9 % IV BOLUS
1000.0000 mL | Freq: Once | INTRAVENOUS | Status: AC
  Administered 2019-02-04: 1000 mL via INTRAVENOUS

## 2019-02-04 NOTE — ED Notes (Signed)
Patient transported to X-ray 

## 2019-02-04 NOTE — ED Notes (Signed)
ED Provider at bedside. 

## 2019-02-04 NOTE — ED Notes (Signed)
ED TO INPATIENT HANDOFF REPORT  ED Nurse Name and Phone #: [phone]  S Name/Age/Gender Ernest Wu 58 y.o. male Room/Bed: 021C/021C  Code Status   Code Status: Prior  Home/SNF/Other Home Patient oriented to: self, place, time and situation Is this baseline? Yes   Triage Complete: Triage complete  Chief Complaint terminal cancer bowel blockage  Triage Note Onset several weeks abd pain.  Has had several paracentesis done, no relief in pain.  Blockage in bowel.  Last BM 10 days.  Pt unable to keep PO fluids/foods/meds down.    Hospice is due to assess patient this Friday.  Wife called hospice today and was advised to come to ED.      Allergies Allergies  Allergen Reactions  . Contrast Media [Iodinated Diagnostic Agents] Other (See Comments)    Burning sensation [SEE CONTRAST MEDIA]  . Iohexol Anaphylaxis, Itching and Rash    Level of Care/Admitting Diagnosis ED Disposition    ED Disposition Condition [facility]: [place] [100100]  Level of Care: Med-Surg [16]  Covid Evaluation: Screening Protocol (No Symptoms)  Diagnosis: Intestinal obstruction [facility]) [[id number]]  Admitting Physician: Elwyn Reach [[id number]]  Attending Physician: Elwyn Reach [[id number]]  Estimated length of stay: past midnight tomorrow  Certification:: I certify this patient will need inpatient services for at least 2 midnights  PT Class (Do Not Modify): Inpatient [101]  PT Acc Code (Do Not Modify): Private [1]       B Medical/Surgery History Past Medical History:  Diagnosis Date  . Anxiety   . Family history of breast cancer   . Family history of colonic polyps   . Family history of pancreatic cancer   . Family history of prostate cancer   . GERD (gastroesophageal reflux disease)   . pancreatic ca 06/2018   Pancreatic   Past Surgical History:  Procedure Laterality Date  . BILIARY STENT PLACEMENT  06/10/2018   Procedure: BILIARY STENT PLACEMENT;   Surgeon: Ronnette Juniper, MD;  Location: [facility] ENDOSCOPY;  Service: Gastroenterology;;  . CHOLECYSTECTOMY N/A 11/10/2018   Procedure: Laparoscopic Cholecystectomy;  Surgeon: Stark Klein, MD;  Location: [place];  Service: General;  Laterality: N/A;  . DIAGNOSTIC LAPAROSCOPIC LIVER BIOPSY N/A 11/10/2018   Procedure: Diagnostic Laparoscopic Liver Biopsy;  Surgeon: Stark Klein, MD;  Location: [place];  Service: General;  Laterality: N/A;  . ERCP N/A 06/10/2018   Procedure: ENDOSCOPIC RETROGRADE CHOLANGIOPANCREATOGRAPHY (ERCP);  Surgeon: Ronnette Juniper, MD;  Location: [place];  Service: Gastroenterology;  Laterality: N/A;  . ESOPHAGOGASTRODUODENOSCOPY (EGD) WITH PROPOFOL N/A 06/10/2018   Procedure: ESOPHAGOGASTRODUODENOSCOPY (EGD) WITH PROPOFOL;  Surgeon: Ronnette Juniper, MD;  Location: [place];  Service: Gastroenterology;  Laterality: N/A;  . FINE NEEDLE ASPIRATION  06/10/2018   Procedure: FINE NEEDLE ASPIRATION (FNA) LINEAR;  Surgeon: Ronnette Juniper, MD;  Location: [place];  Service: Gastroenterology;;  . IR RADIOLOGIST EVAL & MGMT  12/10/2018  . PORT-A-CATH REMOVAL N/A 11/10/2018   Procedure: REMOVAL PORT-A-CATH;  Surgeon: Stark Klein, MD;  Location: [place];  Service: General;  Laterality: N/A;  . PORTACATH PLACEMENT N/A 07/02/2018   Procedure: INSERTION PORT-A-CATH;  Surgeon: Stark Klein, MD;  Location: [place];  Service: General;  Laterality: N/A;  . SPHINCTEROTOMY  06/10/2018   Procedure: SPHINCTEROTOMY;  Surgeon: Ronnette Juniper, MD;  Location: [facility] ENDOSCOPY;  Service: Gastroenterology;;  . UPPER ESOPHAGEAL ENDOSCOPIC ULTRASOUND (EUS) N/A 06/10/2018   Procedure: UPPER ESOPHAGEAL ENDOSCOPIC ULTRASOUND (EUS);  Surgeon: Ronnette Juniper, MD;  Location: [facility]  ENDOSCOPY;  Service: Gastroenterology;  Laterality: N/A;  . VASECTOMY       A IV Location/Drains/Wounds Patient Lines/Drains/Airways Status   Active Line/Drains/Airways    Name:   Placement date:   Placement time:   Site:   Days:   Peripheral IV 02/04/19  Right;Posterior Wrist   02/04/19    2213    Wrist   less than 1   Closed System Drain 1 RUQ Bulb (JP) 12 Fr.   11/21/18    1402    RUQ   75   NG/OG Tube Nasogastric 14 Fr. Right nare Xray;Aucultation   02/04/19    2331    Right nare   less than 1   GI Stent 10 Fr.   06/10/18    1510    -   239   Incision (Closed) 07/02/18 Chest Left   07/02/18    1257     217   Incision (Closed) 11/10/18 Abdomen Other (Comment)   11/10/18    1055     86   Incision (Closed) 11/10/18 Chest Left   11/10/18    1110     86   Incision - 5 Ports Abdomen 1: Umbilicus 2: Left;Upper 3: Mid;Upper 4: Right;Medial 5: Right;Lateral   11/10/18    1018     86          Intake/Output Last 24 hours No intake or output data in the 24 hours ending 02/04/19 2346  Labs/Imaging Results for orders placed or performed during the hospital encounter of 02/04/19 (from the past 48 hour(s))  Comprehensive metabolic panel     Status: Abnormal   Collection Time: 02/04/19 10:12 PM  Result Value Ref Range   Sodium 130 (L) 135 - 145 mmol/L   Potassium 4.4 3.5 - 5.1 mmol/L   Chloride 92 (L) 98 - 111 mmol/L   CO2 26 22 - 32 mmol/L   Glucose, Bld 127 (H) 70 - 99 mg/dL   BUN 7 6 - 20 mg/dL   Creatinine, Ser 0.81 0.61 - 1.24 mg/dL   Calcium 8.8 (L) 8.9 - 10.3 mg/dL   Total Protein 6.1 (L) 6.5 - 8.1 g/dL   Albumin 2.8 (L) 3.5 - 5.0 g/dL   AST 29 15 - 41 U/L   ALT 41 0 - 44 U/L   Alkaline Phosphatase 100 38 - 126 U/L   Total Bilirubin 0.8 0.3 - 1.2 mg/dL   GFR calc non Af Amer >60 >60 mL/min   GFR calc Af Amer >60 >60 mL/min   Anion gap 12 5 - 15    Comment: Performed at [facility], [place] [address] [place], [place] [address]  CBC with Differential     Status: Abnormal   Collection Time: 02/04/19 10:12 PM  Result Value Ref Range   WBC 12.4 (H) 4.0 - 10.5 K/uL   RBC 4.65 4.22 - 5.81 MIL/uL   Hemoglobin 12.9 (L) 13.0 - 17.0 g/dL   HCT 39.2 39.0 - 52.0 %   MCV 84.3 80.0 - 100.0 fL   MCH 27.7 26.0 - 34.0 pg   MCHC 32.9 30.0  - 36.0 g/dL   RDW 15.9 (H) 11.5 - 15.5 %   Platelets 467 (H) 150 - 400 K/uL   nRBC 0.0 0.0 - 0.2 %   Neutrophils Relative % 80 %   Neutro Abs 9.7 (H) 1.7 - 7.7 K/uL   Lymphocytes Relative 8 %   Lymphs Abs 1.0 0.7 - 4.0 K/uL  Monocytes Relative 12 %   Monocytes Absolute 1.5 (H) 0.1 - 1.0 K/uL   Eosinophils Relative 0 %   Eosinophils Absolute 0.0 0.0 - 0.5 K/uL   Basophils Relative 0 %   Basophils Absolute 0.0 0.0 - 0.1 K/uL   Immature Granulocytes 0 %   Abs Immature Granulocytes 0.04 0.00 - 0.07 K/uL    Comment: Performed at [facility] [address], [place], [place] [address]   Dg Abd Acute W/chest  Result Date: 02/04/2019 CLINICAL DATA:  Abdominal pain EXAM: DG ABDOMEN ACUTE W/ 1V CHEST COMPARISON:  CT [id number] FINDINGS: Metallic stent noted in the right abdomen, likely biliary. Marked gaseous distention of the transverse colon with air-fluid levels. Pneumobilia noted. Prior cholecystectomy. No free air organomegaly. Heart is normal size. Lungs clear. No effusions or acute bony abnormality. IMPRESSION: Prior cholecystectomy with biliary stent and pneumobilia. Marked gaseous distention of the colon, most notable in the transverse colon with air-fluid levels, similar to prior CT. Electronically Signed   By: Rolm Baptise M.D.   On: 02/04/2019 22:50    Pending Labs Unresulted Labs (From admission, onward)    Start     Ordered   02/04/19 2301  SARS Coronavirus 2 (CEPHEID - Performed in [place] hospital lab), [place]  (Asymptomatic Patients Labs)  Once,   STAT    Question:  Rule Out  Answer:  Yes   02/04/19 2300   Signed and Held  CBC  (enoxaparin (LOVENOX)    CrCl >/= 30 ml/min)  Once,   R    Comments: Baseline for enoxaparin therapy IF NOT ALREADY DRAWN.  Notify MD if PLT < 100 K.    Signed and Held   Signed and Held  Creatinine, serum  (enoxaparin (LOVENOX)    CrCl >/= 30 ml/min)  Once,   R    Comments: Baseline for enoxaparin therapy IF NOT ALREADY DRAWN.    Signed  and Held   Signed and Held  Creatinine, serum  (enoxaparin (LOVENOX)    CrCl >/= 30 ml/min)  Weekly,   R    Comments: while on enoxaparin therapy    Signed and Held   Signed and Held  Comprehensive metabolic panel  Tomorrow morning,   R     Signed and Held   Signed and Held  CBC  Tomorrow morning,   R     Signed and Held          Vitals/Pain Today's Vitals   02/04/19 2141 02/04/19 2200 02/04/19 2250 02/04/19 2254  BP: 129/83 (!) 123/93 132/90   Pulse: (!) 120 100 96   Resp: 18 16 (!) 21   Temp: 97.8 F (36.6 C)     TempSrc: Oral     SpO2: 97% 95% 98%   Weight:    84.5 kg  Height:    6' (1.829 m)  PainSc:        Isolation Precautions No active isolations  Medications Medications  sodium chloride 0.9 % bolus 1,000 mL (1,000 mLs Intravenous New Bag/Given 02/04/19 2250)    Mobility walks Low fall risk   Focused Assessments abdominal distention, fullness. NG placed.    R Recommendations: See Admitting Provider Note  Report given to:   Additional Notes:

## 2019-02-04 NOTE — ED Notes (Signed)
Pt wife called and updated on pt status

## 2019-02-04 NOTE — ED Notes (Signed)
Patient aware that we need urine sample for testing, unable at this time. Pt given instruction on providing urine sample when able to do so.   

## 2019-02-04 NOTE — ED Triage Notes (Signed)
Onset several weeks abd pain.  Has had several paracentesis done, no relief in pain.  Blockage in bowel.  Last BM 10 days.  Pt unable to keep PO fluids/foods/meds down.    Hospice is due to assess patient this Friday.  Wife called hospice today and was advised to come to ED.

## 2019-02-04 NOTE — Telephone Encounter (Signed)
Spoke with Dr. Therisa Doyne regarding Dr. Ernestina Penna concern patient has a blockage and would he benefit from a stent in sigmoid colon.  She will review scan and speak with radiologist and call Dr. Burr Medico on her cell phone.

## 2019-02-04 NOTE — Telephone Encounter (Signed)
I spoke with Dr. Therisa Doyne who recommends a CT pelvis with rectal contrast to evaluate his possible sigmoid/decending colon obstruction, she will speak with Dr. Watt Climes if stent is feasible, based on the CT findings. I spoke with pt's wife, she is interested in pursing this, CT scan ordered today, and sent message to get urgent pre-auth and radiology scheduling.   Ernest Wu  02/04/2019 4:52 PM

## 2019-02-04 NOTE — H&P (Signed)
History and Physical   Ernest Wu TIW:[id number] DOB: 1961/05/15 DOA: 02/04/2019  Referring MD/NP/PA: Dr. Gilford Wu  PCP: Street, Ernest Mt, MD   Outpatient Specialists: Dr. Burr Wu, Oncology  Patient coming from: Home  Chief Complaint: Nausea with vomiting  HPI: Ernest Wu is a 58 y.o. male with medical history significant of pancreatic cancer, GERD, tobacco abuse, previous biliary stent, cholecystectomy who has been having Abdominal pain with nausea and vomiting for a while now.  Previous studies indicated that patient has possible sigmoid or descending colon obstruction.  Patient was seen by oncology today and there was discussion with Dr. Therisa Wu of GI with recommendation for CT pelvis with rectal contrast and possibly consider stenting based on the CT findings.  Patient has continued to have nausea vomiting abdominal pain.  He is unable to keep anything down.  He therefore came to the ER for further evaluation.  He still retching with significant pain 8 out of 10.  Has significant abdominal pain with distention.  Patient had paracentesis 2 days ago.  He is being admitted for intractable nausea vomiting but also evaluation of his intestinal obstruction.  ED Course: Temperature is 97.8 blood pressure 123/93 pulse 120 respiratory rate of 21 oxygen sats 95% on room air. WBC 12.4, Hb 12.9, Platelets 467, Sodium 130, Chloride 92, creatinine 0.81, Calcium 8.8, glucose 127.  Patient is being admitted for work-up and supportive care since he is unable to keep anything down.  Review of Systems: As per HPI otherwise 10 point review of systems negative.    Past Medical History:  Diagnosis Date  . Anxiety   . Family history of breast cancer   . Family history of colonic polyps   . Family history of pancreatic cancer   . Family history of prostate cancer   . GERD (gastroesophageal reflux disease)   . pancreatic ca 06/2018   Pancreatic    Past Surgical History:  Procedure Laterality Date   . BILIARY STENT PLACEMENT  06/10/2018   Procedure: BILIARY STENT PLACEMENT;  Surgeon: Ernest Juniper, MD;  Location: [facility] ENDOSCOPY;  Service: Gastroenterology;;  . CHOLECYSTECTOMY N/A 11/10/2018   Procedure: Laparoscopic Cholecystectomy;  Surgeon: Ernest Klein, MD;  Location: [place];  Service: General;  Laterality: N/A;  . DIAGNOSTIC LAPAROSCOPIC LIVER BIOPSY N/A 11/10/2018   Procedure: Diagnostic Laparoscopic Liver Biopsy;  Surgeon: Ernest Klein, MD;  Location: [place];  Service: General;  Laterality: N/A;  . ERCP N/A 06/10/2018   Procedure: ENDOSCOPIC RETROGRADE CHOLANGIOPANCREATOGRAPHY (ERCP);  Surgeon: Ernest Juniper, MD;  Location: [place];  Service: Gastroenterology;  Laterality: N/A;  . ESOPHAGOGASTRODUODENOSCOPY (EGD) WITH PROPOFOL N/A 06/10/2018   Procedure: ESOPHAGOGASTRODUODENOSCOPY (EGD) WITH PROPOFOL;  Surgeon: Ernest Juniper, MD;  Location: [place];  Service: Gastroenterology;  Laterality: N/A;  . FINE NEEDLE ASPIRATION  06/10/2018   Procedure: FINE NEEDLE ASPIRATION (FNA) LINEAR;  Surgeon: Ernest Juniper, MD;  Location: [place];  Service: Gastroenterology;;  . IR RADIOLOGIST EVAL & MGMT  12/10/2018  . PORT-A-CATH REMOVAL N/A 11/10/2018   Procedure: REMOVAL PORT-A-CATH;  Surgeon: Ernest Klein, MD;  Location: [place];  Service: General;  Laterality: N/A;  . PORTACATH PLACEMENT N/A 07/02/2018   Procedure: INSERTION PORT-A-CATH;  Surgeon: Ernest Klein, MD;  Location: [place];  Service: General;  Laterality: N/A;  . SPHINCTEROTOMY  06/10/2018   Procedure: SPHINCTEROTOMY;  Surgeon: Ernest Juniper, MD;  Location: [facility] ENDOSCOPY;  Service: Gastroenterology;;  . UPPER ESOPHAGEAL ENDOSCOPIC ULTRASOUND (EUS) N/A 06/10/2018   Procedure: UPPER ESOPHAGEAL ENDOSCOPIC ULTRASOUND (EUS);  Surgeon:  Ernest Juniper, MD;  Location: [facility] ENDOSCOPY;  Service: Gastroenterology;  Laterality: N/A;  . VASECTOMY       reports that he has been smoking. He has a 7.50 pack-year smoking history. He has never used smokeless tobacco.  He reports current alcohol use. He reports that he does not use drugs.  Allergies  Allergen Reactions  . Contrast Media [Iodinated Diagnostic Agents] Other (See Comments)    Burning sensation [SEE CONTRAST MEDIA]  . Ernest Wu Anaphylaxis, Itching and Rash    Family History  Problem Relation Age of Onset  . Stroke Mother   . Hypertension Mother   . CAD Father        CABG  . Pancreatic cancer Paternal Grandfather   . Breast cancer Maternal Aunt   . Lymphoma Paternal Uncle   . Other Maternal Uncle        Ernest Wu  . Stroke Maternal Grandmother   . Prostate cancer Cousin        pat first cousin, dx between 16-60     Prior to Admission medications   Medication Sig Start Date End Date Taking? Authorizing Provider  ALPRAZolam (XANAX) 0.25 MG tablet Take 0.25 mg by mouth 2 (two) times daily as needed for anxiety.    [provider]  diphenoxylate-atropine (LOMOTIL) 2.5-0.025 MG tablet Take 2 tablets by mouth 4 (four) times daily as needed for diarrhea or loose stools. Patient not taking: Reported on 12/26/2018 06/27/18   Ernest Feeling, NP  docusate sodium (COLACE) 100 MG capsule Take 200 mg by mouth daily as needed for mild constipation.    [provider]  esomeprazole (NEXIUM) 20 MG capsule Take 20 mg by mouth daily after breakfast.     [provider]  folic acid (FOLVITE) 1 MG tablet Take 1 tablet (1 mg total) by mouth daily after breakfast. 12/08/18   Ernest Merle, MD  lactulose (CHRONULAC) 10 GM/15ML solution Take 45 mLs (30 g total) by mouth 3 (three) times daily. Patient taking differently: Take 30 g by mouth 2 (two) times daily as needed for moderate constipation.  02/02/19   Ernest Wu., PA-C  magic mouthwash w/lidocaine SOLN Take 5 mLs by mouth 4 (four) times daily as needed for mouth pain. 08/22/18   Ernest Wu., PA-C  Methylcobalamin (B-12) 5000 MCG TBDP Take 5,000 mcg by mouth daily.    [provider]  ondansetron (ZOFRAN) 8 MG  tablet Take 1 tablet (8 mg total) by mouth every 8 (eight) hours as needed for nausea or vomiting. 12/22/18   Ernest Feeling, NP  oxyCODONE (OXY IR/ROXICODONE) 5 MG immediate release tablet Take 5 mg by mouth every 4 (four) hours as needed for severe pain.    [provider]  polyethylene glycol (MIRALAX / GLYCOLAX) 17 g packet Take 17 g by mouth daily as needed for mild constipation.     [provider]  prochlorperazine (COMPAZINE) 10 MG tablet Take 1 tablet (10 mg total) by mouth every 6 (six) hours as needed (NAUSEA). 01/19/19   Ernest Merle, MD  rivaroxaban (XARELTO) 20 MG TABS tablet Take 1 tablet (20 mg total) by mouth daily after breakfast. 01/08/19   Ernest Merle, MD  simethicone (MYLICON) 025 MG chewable tablet Chew 250 mg by mouth every 6 (six) hours as needed for flatulence.     [provider]  Syringe, Disposable, (10-12CC SYRINGE) 12 ML MISC 10 mLs by Does not apply route as needed. 12/04/18   Cira Rue  K, NP  traMADol (ULTRAM) 50 MG tablet Take 50 mg by mouth every 6 (six) hours as needed (pain).     [provider]    Physical Exam: Vitals:   02/04/19 2141 02/04/19 2200 02/04/19 2250 02/04/19 2254  BP: 129/83 (!) 123/93 132/90   Pulse: (!) 120 100 96   Resp: 18 16 (!) 21   Temp: 97.8 F (36.6 C)     TempSrc: Oral     SpO2: 97% 95% 98%   Weight:    84.5 kg  Height:    6' (1.829 m)      Constitutional: Chronically ill looking with mild cachexia Vitals:   02/04/19 2141 02/04/19 2200 02/04/19 2250 02/04/19 2254  BP: 129/83 (!) 123/93 132/90   Pulse: (!) 120 100 96   Resp: 18 16 (!) 21   Temp: 97.8 F (36.6 C)     TempSrc: Oral     SpO2: 97% 95% 98%   Weight:    84.5 kg  Height:    6' (1.829 m)   Eyes: PERRL, lids and conjunctivae normal ENMT: Mucous membranes are moist. Posterior pharynx clear of any exudate or lesions.Normal dentition.  Neck: normal, supple, no masses, no thyromegaly Respiratory: clear to auscultation bilaterally,  no wheezing, no crackles. Normal respiratory effort. No accessory muscle use.  Cardiovascular: Sinus tachycardia, no murmurs / rubs / gallops. No extremity edema. 2+ pedal pulses. No carotid bruits.  Abdomen: Distended abdomen, tympanitic, diffusely tender bowel sounds positive.  Musculoskeletal: no clubbing / cyanosis. No joint deformity upper and lower extremities. Good ROM, no contractures. Normal muscle tone.  Skin: no rashes, lesions, ulcers. No induration Neurologic: CN 2-12 grossly intact. Sensation intact, DTR normal. Strength 5/5 in all 4.  Psychiatric: Normal judgment and insight. Alert and oriented x 3. Normal mood.     Labs on Admission: I have personally reviewed following labs and imaging studies  CBC: Recent Labs  Lab 02/01/19 1320 02/04/19 2212  WBC 8.8 12.4*  NEUTROABS 6.7 9.7*  HGB 12.2* 12.9*  HCT 39.5 39.2  MCV 89.4 84.3  PLT 330 175*   Basic Metabolic Panel: Recent Labs  Lab 02/01/19 1320 02/04/19 2212  NA 135 130*  K 4.4 4.4  CL 99 92*  CO2 27 26  GLUCOSE 141* 127*  BUN 7 7  CREATININE 0.67 0.81  CALCIUM 8.7* 8.8*   GFR: Estimated Creatinine Clearance: 106.4 mL/min (by C-G formula based on SCr of 0.81 mg/dL). Liver Function Tests: Recent Labs  Lab 02/01/19 1320 02/04/19 2212  AST 24 29  ALT 39 41  ALKPHOS 116 100  BILITOT 0.1* 0.8  PROT 6.7 6.1*  ALBUMIN 3.1* 2.8*   Recent Labs  Lab 02/01/19 1320  LIPASE 19   No results for input(s): AMMONIA in the last 168 hours. Coagulation Profile: No results for input(s): INR, PROTIME in the last 168 hours. Cardiac Enzymes: No results for input(s): CKTOTAL, CKMB, CKMBINDEX, TROPONINI in the last 168 hours. BNP (last 3 results) No results for input(s): PROBNP in the last 8760 hours. HbA1C: No results for input(s): HGBA1C in the last 72 hours. CBG: No results for input(s): GLUCAP in the last 168 hours. Lipid Profile: No results for input(s): CHOL, HDL, LDLCALC, TRIG, CHOLHDL, LDLDIRECT in  the last 72 hours. Thyroid Function Tests: No results for input(s): TSH, T4TOTAL, FREET4, T3FREE, THYROIDAB in the last 72 hours. Anemia Panel: No results for input(s): VITAMINB12, FOLATE, FERRITIN, TIBC, IRON, RETICCTPCT in the last 72 hours. Urine analysis:  Component Value Date/Time   COLORURINE YELLOW 02/01/2019 1320   APPEARANCEUR CLEAR 02/01/2019 1320   LABSPEC 1.027 02/01/2019 1320   PHURINE 5.0 02/01/2019 1320   GLUCOSEU NEGATIVE 02/01/2019 1320   HGBUR NEGATIVE 02/01/2019 1320   BILIRUBINUR NEGATIVE 02/01/2019 1320   KETONESUR NEGATIVE 02/01/2019 1320   PROTEINUR NEGATIVE 02/01/2019 1320   NITRITE NEGATIVE 02/01/2019 1320   LEUKOCYTESUR NEGATIVE 02/01/2019 1320   Sepsis Labs: @LABRCNTIP (procalcitonin:4,lacticidven:4) )No results found for this or any previous visit (from the past 240 hour(s)).   Radiological Exams on Admission: Dg Abd Acute W/chest  Result Date: 02/04/2019 CLINICAL DATA:  Abdominal pain EXAM: DG ABDOMEN ACUTE W/ 1V CHEST COMPARISON:  CT 628 7062 FINDINGS: Metallic stent noted in the right abdomen, likely biliary. Marked gaseous distention of the transverse colon with air-fluid levels. Pneumobilia noted. Prior cholecystectomy. No free air organomegaly. Heart is normal size. Lungs clear. No effusions or acute bony abnormality. IMPRESSION: Prior cholecystectomy with biliary stent and pneumobilia. Marked gaseous distention of the colon, most notable in the transverse colon with air-fluid levels, similar to prior CT. Electronically Signed   By: Rolm Baptise M.D.   On: 02/04/2019 22:50      Assessment/Plan Principal Problem:   Intestinal obstruction (HCC) Active Problems:   Jaundice   Tobacco use   Adenocarcinoma of pancreas (HCC)   Nausea & vomiting     #1 intestinal obstruction: Secondary to pancreatic cancer and possible mass-effect.  Patient will be admitted and kept n.p.o. NG tube is started, IVF D5 normal saline for now.  We will order the CT  pelvis with contrast to evaluate obstruction.  Oncology and GI will be consulted in the morning and recommendations will be followed.  #2 adenocarcinoma of the pancreas: Continue care per oncology.  Appears to be advanced.  #3 tobacco abuse: Counseling provided.  Offered patient nicotine patch.  #4 persistent nausea with vomiting: Secondary to obstruction.  Has NG tube at the moment.  Symptomatic treatment with n.p.o. status.  Aggressive hydration.  DVT prophylaxis: Lovenox Wu Status: Full Wu Family Communication: No family at bedside so discussed with patient Disposition Plan: To be determined Consults called: Consult oncology Dr. Burr Wu and Dr. Therisa Wu in the morning Admission status: Inpatient  Severity of Illness: The appropriate patient status for this patient is INPATIENT. Inpatient status is judged to be reasonable and necessary in order to provide the required intensity of service to ensure the patient's safety. The patient's presenting symptoms, physical exam findings, and initial radiographic and laboratory data in the context of their chronic comorbidities is felt to place them at high risk for further clinical deterioration. Furthermore, it is not anticipated that the patient will be medically stable for discharge from the hospital within 2 midnights of admission. The following factors support the patient status of inpatient.   " The patient's presenting symptoms include nausea vomiting with abdominal pain. " The worrisome physical exam findings include abdominal distention. " The initial radiographic and laboratory data are worrisome because of evidence of intestinal obstruction. " The chronic co-morbidities include pancreatic cancer.   * I certify that at the point of admission it is my clinical judgment that the patient will require inpatient hospital care spanning beyond 2 midnights from the point of admission due to high intensity of service, high risk for further  deterioration and high frequency of surveillance required.Barbette Merino MD [facility] Pager [phone]  If 7PM-7AM, please contact night-coverage [email or web address] Password [email or web address]  02/04/2019, 11:45 PM

## 2019-02-04 NOTE — ED Provider Notes (Signed)
[facility] EMERGENCY DEPARTMENT Provider Note   CSN: [id number] Arrival date & time: 02/04/19  2124     History   Chief Complaint Chief Complaint  Patient presents with  . Abdominal Pain    HPI Ernest Wu is a 58 y.o. male who presents with abdominal pain.  Past medical history significant for pancreatic cancer currently undergoing chemo, recurrent ascites.  Patient states that he believes he has a bowel blockage.  He has had nausea and vomiting for several days.  He also reports constipation has had a bowel movement in 10 days.  He was seen in the emergency department on 6/28.  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  He has been using multiple medications for constipation without significant relief.  He received an enema in the ED and did not have a bowel movement.  He had a therapeutic paracentesis done 2 days ago which temporarily improved his pain however as soon as the fluid re-collected he felt worse again.  He followed up with his oncologist Dr. Burr Medico who recommended him to come back to the ED since he is not improving. He overall feels fatigued and dehydrated. He takes narcotics chronically for pain.     HPI  Past Medical History:  Diagnosis Date  . Anxiety   . Family history of breast cancer   . Family history of colonic polyps   . Family history of pancreatic cancer   . Family history of prostate cancer   . GERD (gastroesophageal reflux disease)   . pancreatic ca 06/2018   Pancreatic    Patient Active Problem List   Diagnosis Date Noted  . Genetic testing 12/22/2018  . Goals of care, counseling/discussion 12/10/2018  . Family history of pancreatic cancer   . Family history of breast cancer   . Family history of colonic polyps   . Family history of prostate cancer   . Sepsis ([place])  11/19/2018  . Acute bronchitis 11/19/2018  . Hypokalemia 11/19/2018  . Abnormal LFTs 11/19/2018  . Suspected Covid-19 Virus Infection   . Port-A-Cath in place 07/17/2018  . Adenocarcinoma of pancreas ([employer]) 06/27/2018  . Pancreatic mass   . Tobacco use   . Direct hyperbilirubinemia   . Jaundice 06/07/2018    Past Surgical History:  Procedure Laterality Date  . BILIARY STENT PLACEMENT  06/10/2018   Procedure: BILIARY STENT PLACEMENT;  Surgeon: Ronnette Juniper, MD;  Location: [facility] ENDOSCOPY;  Service: Gastroenterology;;  . CHOLECYSTECTOMY N/A 11/10/2018   Procedure: Laparoscopic Cholecystectomy;  Surgeon: Stark Klein, MD;  Location: [place];  Service: General;  Laterality: N/A;  . DIAGNOSTIC LAPAROSCOPIC LIVER BIOPSY N/A 11/10/2018   Procedure: Diagnostic Laparoscopic Liver Biopsy;  Surgeon: Stark Klein, MD;  Location: [place];  Service: General;  Laterality: N/A;  . ERCP N/A 06/10/2018   Procedure: ENDOSCOPIC RETROGRADE CHOLANGIOPANCREATOGRAPHY (ERCP);  Surgeon: Ronnette Juniper, MD;  Location: [place];  Service: Gastroenterology;  Laterality: N/A;  . ESOPHAGOGASTRODUODENOSCOPY (EGD) WITH PROPOFOL N/A 06/10/2018   Procedure: ESOPHAGOGASTRODUODENOSCOPY (EGD) WITH PROPOFOL;  Surgeon: Ronnette Juniper, MD;  Location: [place];  Service: Gastroenterology;  Laterality: N/A;  . FINE NEEDLE ASPIRATION  06/10/2018   Procedure: FINE NEEDLE ASPIRATION (FNA) LINEAR;  Surgeon: Ronnette Juniper, MD;  Location: [place];  Service: Gastroenterology;;  . IR RADIOLOGIST EVAL & MGMT  12/10/2018  . PORT-A-CATH REMOVAL N/A 11/10/2018   Procedure: REMOVAL  PORT-A-CATH;  Surgeon: Stark Klein, MD;  Location: [place];  Service: General;  Laterality: N/A;  . PORTACATH PLACEMENT N/A 07/02/2018   Procedure: INSERTION PORT-A-CATH;  Surgeon: Stark Klein, MD;  Location: [place];  Service: General;  Laterality: N/A;  . SPHINCTEROTOMY  06/10/2018   Procedure: SPHINCTEROTOMY;  Surgeon: Ronnette Juniper, MD;  Location: [facility] ENDOSCOPY;  Service:  Gastroenterology;;  . UPPER ESOPHAGEAL ENDOSCOPIC ULTRASOUND (EUS) N/A 06/10/2018   Procedure: UPPER ESOPHAGEAL ENDOSCOPIC ULTRASOUND (EUS);  Surgeon: Ronnette Juniper, MD;  Location: [place];  Service: Gastroenterology;  Laterality: N/A;  . VASECTOMY          Home Medications    Prior to Admission medications   Medication Sig Start Date End Date Taking? Authorizing Provider  ALPRAZolam (XANAX) 0.25 MG tablet Take 0.25 mg by mouth 2 (two) times daily as needed for anxiety.    [provider]  diphenoxylate-atropine (LOMOTIL) 2.5-0.025 MG tablet Take 2 tablets by mouth 4 (four) times daily as needed for diarrhea or loose stools. Patient not taking: Reported on 12/26/2018 06/27/18   Alla Feeling, NP  docusate sodium (COLACE) 100 MG capsule Take 200 mg by mouth daily as needed for mild constipation.    [provider]  esomeprazole (NEXIUM) 20 MG capsule Take 20 mg by mouth daily after breakfast.     [provider]  folic acid (FOLVITE) 1 MG tablet Take 1 tablet (1 mg total) by mouth daily after breakfast. 12/08/18   [name], MD  lactulose (CHRONULAC) 10 GM/15ML solution Take 45 mLs (30 g total) by mouth 3 (three) times daily. Patient taking differently: Take 30 g by mouth 2 (two) times daily as needed for moderate constipation.  02/02/19   Harle Stanford., PA-C  magic mouthwash w/lidocaine SOLN Take 5 mLs by mouth 4 (four) times daily as needed for mouth pain. 08/22/18   Tanner, Lyndon Code., PA-C  Methylcobalamin (B-12) 5000 MCG TBDP Take 5,000 mcg by mouth daily.    [provider]  ondansetron (ZOFRAN) 8 MG tablet Take 1 tablet (8 mg total) by mouth every 8 (eight) hours as needed for nausea or vomiting. 12/22/18   Alla Feeling, NP  oxyCODONE (OXY IR/ROXICODONE) 5 MG immediate release tablet Take 5 mg by mouth every 4 (four) hours as needed for severe pain.    [provider]  polyethylene glycol (MIRALAX / GLYCOLAX) 17 g packet Take 17 g by mouth  daily as needed for mild constipation.     [provider]  prochlorperazine (COMPAZINE) 10 MG tablet Take 1 tablet (10 mg total) by mouth every 6 (six) hours as needed (NAUSEA). 01/19/19   [name], MD  rivaroxaban (XARELTO) 20 MG TABS tablet Take 1 tablet (20 mg total) by mouth daily after breakfast. 01/08/19   [name], MD  simethicone (MYLICON) 850 MG chewable tablet Chew 250 mg by mouth every 6 (six) hours as needed for flatulence.     [provider]  Syringe, Disposable, (10-12CC SYRINGE) 12 ML MISC 10 mLs by Does not apply route as needed. 12/04/18   Alla Feeling, NP  traMADol (ULTRAM) 50 MG tablet Take 50 mg by mouth every 6 (six) hours as needed (pain).     [provider]    Family History Family History  Problem Relation Age of Onset  . Stroke Mother   . Hypertension Mother   . CAD Father        CABG  . Pancreatic cancer Paternal Grandfather   .  Breast cancer Maternal Aunt   . Lymphoma Paternal Uncle   . Other Maternal Uncle        [name]  . Stroke Maternal Grandmother   . Prostate cancer Cousin        pat first cousin, dx between 4-60    Social History Social History   Tobacco Use  . Smoking status: Current Every Day Smoker    Packs/day: 0.25    Years: 30.00    Pack years: 7.50  . Smokeless tobacco: Never Used  . Tobacco comment: cut back considerably  Substance Use Topics  . Alcohol use: Yes    Comment: 6-7 beers Q week  . Drug use: Never     Allergies   Contrast media [iodinated diagnostic agents] and Iohexol   Review of Systems Review of Systems  Constitutional: Negative for fever.  Respiratory: Negative for shortness of breath.   Cardiovascular: Negative for chest pain.  Gastrointestinal: Positive for abdominal distention, abdominal pain, constipation, nausea and vomiting. Negative for diarrhea.  Genitourinary: Negative for difficulty urinating and dysuria.  Allergic/Immunologic: Positive for immunocompromised  state.  All other systems reviewed and are negative.    Physical Exam Updated Vital Signs BP (!) 123/93   Pulse 100   Temp 97.8 F (36.6 C) (Oral)   Resp 16   SpO2 95%   Physical Exam Vitals signs and nursing note reviewed.  Constitutional:      General: He is not in acute distress.    Appearance: He is well-developed. He is not ill-appearing.  HENT:     Head: Normocephalic and atraumatic.  Eyes:     General: No scleral icterus.       Right eye: No discharge.        Left eye: No discharge.     Conjunctiva/sclera: Conjunctivae normal.     Pupils: Pupils are equal, round, and reactive to light.  Neck:     Musculoskeletal: Normal range of motion.  Cardiovascular:     Rate and Rhythm: Regular rhythm. Tachycardia present.  Pulmonary:     Effort: Pulmonary effort is normal. No respiratory distress.     Breath sounds: Normal breath sounds.  Abdominal:     General: There is distension.     Palpations: Abdomen is soft.     Tenderness: There is abdominal tenderness (mild, generalized).  Skin:    General: Skin is warm and dry.  Neurological:     Mental Status: He is alert and oriented to person, place, and time.  Psychiatric:        Behavior: Behavior normal.      ED Treatments / Results  Labs (all labs ordered are listed, but only abnormal results are displayed) Labs Reviewed  COMPREHENSIVE METABOLIC PANEL - Abnormal; Notable for the following components:      Result Value   Sodium 130 (*)    Chloride 92 (*)    Glucose, Bld 127 (*)    Calcium 8.8 (*)    Total Protein 6.1 (*)    Albumin 2.8 (*)    All other components within normal limits  CBC WITH DIFFERENTIAL/PLATELET - Abnormal; Notable for the following components:   WBC 12.4 (*)    Hemoglobin 12.9 (*)    RDW 15.9 (*)    Platelets 467 (*)    Neutro Abs 9.7 (*)    Monocytes Absolute 1.5 (*)    All other components within normal limits  SARS CORONAVIRUS 2 (HOSPITAL ORDER, [place]  LAB)  URINALYSIS, ROUTINE W REFLEX MICROSCOPIC    EKG None  Radiology Dg Abd Acute W/chest  Result Date: 02/04/2019 CLINICAL DATA:  Abdominal pain EXAM: DG ABDOMEN ACUTE W/ 1V CHEST COMPARISON:  CT 628 [id number] FINDINGS: Metallic stent noted in the right abdomen, likely biliary. Marked gaseous distention of the transverse colon with air-fluid levels. Pneumobilia noted. Prior cholecystectomy. No free air organomegaly. Heart is normal size. Lungs clear. No effusions or acute bony abnormality. IMPRESSION: Prior cholecystectomy with biliary stent and pneumobilia. Marked gaseous distention of the colon, most notable in the transverse colon with air-fluid levels, similar to prior CT. Electronically Signed   By: Rolm Baptise M.D.   On: 02/04/2019 22:50    Procedures Procedures (including critical care time)  Medications Ordered in ED Medications  sodium chloride 0.9 % bolus 1,000 mL (1,000 mLs Intravenous New Bag/Given 02/04/19 2250)     Initial Impression / Assessment and Plan / ED Course  I have reviewed the triage vital signs and the nursing notes.  Pertinent labs & imaging results that were available during my care of the patient were reviewed by me and considered in my medical decision making (see chart for details).  58 year old male presents with abdominal pain, distention, nausea vomiting, and constipation.  Symptoms have been ongoing and worsening over the past 10 days.  He is mildly tachycardic but otherwise vital signs are normal.  Abdomen is distended and mildly tender.  CBC is remarkable for mild leukocytosis.  Does not have any evidence of infection at this time.  CMP is remarkable for mild hyponatremia, low calcium, low protein likely due to poor oral intake.  Will obtain abdominal x-ray since he just had a CT  X-ray shows marked gaseous distention of the colon with air-fluid levels.  Discussed with attending Dr. Gilford Raid.  Will place NG tube.  Discussed with Dr. Jonelle Sidle who will admit.   Final Clinical Impressions(s) / ED Diagnoses   Final diagnoses:  Generalized abdominal pain  Intractable vomiting with nausea, unspecified vomiting type  Constipation, unspecified constipation type    ED Discharge Orders    None       Recardo Evangelist, PA-C 02/04/19 2341    Isla Pence, MD 02/11/19 [phone]

## 2019-02-04 NOTE — Telephone Encounter (Signed)
Spoke with [name] patient's wife regarding pleurex drain placement, first available is Monday  7/6 at 10 at [facility], also she and the patient are in agreement to Hospice at this time.  Referral made to [facility], faxed over last two office notes, insurance card and demographics.

## 2019-02-05 ENCOUNTER — Encounter (HOSPITAL_COMMUNITY): Payer: Self-pay | Admitting: *Deleted

## 2019-02-05 ENCOUNTER — Other Ambulatory Visit (HOSPITAL_COMMUNITY): Payer: Managed Care, Other (non HMO)

## 2019-02-05 ENCOUNTER — Inpatient Hospital Stay (HOSPITAL_COMMUNITY): Payer: Managed Care, Other (non HMO)

## 2019-02-05 DIAGNOSIS — K56609 Unspecified intestinal obstruction, unspecified as to partial versus complete obstruction: Secondary | ICD-10-CM

## 2019-02-05 DIAGNOSIS — K56699 Other intestinal obstruction unspecified as to partial versus complete obstruction: Secondary | ICD-10-CM

## 2019-02-05 DIAGNOSIS — C259 Malignant neoplasm of pancreas, unspecified: Secondary | ICD-10-CM

## 2019-02-05 HISTORY — DX: Unspecified intestinal obstruction, unspecified as to partial versus complete obstruction: K56.609

## 2019-02-05 LAB — CBC
HCT: 34 % — ABNORMAL LOW (ref 39.0–52.0)
Hemoglobin: 11.3 g/dL — ABNORMAL LOW (ref 13.0–17.0)
MCH: 28 pg (ref 26.0–34.0)
MCHC: 33.2 g/dL (ref 30.0–36.0)
MCV: 84.2 fL (ref 80.0–100.0)
Platelets: 419 10*3/uL — ABNORMAL HIGH (ref 150–400)
RBC: 4.04 MIL/uL — ABNORMAL LOW (ref 4.22–5.81)
RDW: 15.9 % — ABNORMAL HIGH (ref 11.5–15.5)
WBC: 9 10*3/uL (ref 4.0–10.5)
nRBC: 0 % (ref 0.0–0.2)

## 2019-02-05 LAB — COMPREHENSIVE METABOLIC PANEL
ALT: 35 U/L (ref 0–44)
AST: 25 U/L (ref 15–41)
Albumin: 2.3 g/dL — ABNORMAL LOW (ref 3.5–5.0)
Alkaline Phosphatase: 93 U/L (ref 38–126)
Anion gap: 10 (ref 5–15)
BUN: 8 mg/dL (ref 6–20)
CO2: 26 mmol/L (ref 22–32)
Calcium: 8.1 mg/dL — ABNORMAL LOW (ref 8.9–10.3)
Chloride: 95 mmol/L — ABNORMAL LOW (ref 98–111)
Creatinine, Ser: 0.73 mg/dL (ref 0.61–1.24)
GFR calc Af Amer: >60 mL/min (ref >60)
GFR calc non Af Amer: >60 mL/min (ref >60)
Glucose, Bld: 138 mg/dL — ABNORMAL HIGH (ref 70–99)
Potassium: 4.2 mmol/L (ref 3.5–5.1)
Sodium: 131 mmol/L — ABNORMAL LOW (ref 135–145)
Total Bilirubin: 0.5 mg/dL (ref 0.3–1.2)
Total Protein: 5.3 g/dL — ABNORMAL LOW (ref 6.5–8.1)

## 2019-02-05 LAB — SARS CORONAVIRUS 2 BY RT PCR (HOSPITAL ORDER, PERFORMED IN ~~LOC~~ HOSPITAL LAB): SARS Coronavirus 2: NEGATIVE

## 2019-02-05 MED ORDER — ONDANSETRON HCL 4 MG PO TABS: 4.0000 mg | ORAL_TABLET | Freq: Four times a day (QID) | ORAL | Status: DC | PRN

## 2019-02-05 MED ORDER — SODIUM CHLORIDE 0.9% FLUSH
10.0000 mL | Freq: Two times a day (BID) | INTRAVENOUS | Status: DC
  Administered 2019-02-05 – 2019-02-11 (×6): 10 mL

## 2019-02-05 MED ORDER — SODIUM CHLORIDE 0.9% FLUSH
10.0000 mL | INTRAVENOUS | Status: DC | PRN
  Administered 2019-02-09 – 2019-02-10 (×2): 10 mL
  Filled 2019-02-05 (×2): qty 40

## 2019-02-05 MED ORDER — FLEET ENEMA 7-19 GM/118ML RE ENEM
1.0000 | ENEMA | Freq: Once | RECTAL | Status: AC
  Administered 2019-02-05: 1 via RECTAL
  Filled 2019-02-05: qty 1

## 2019-02-05 MED ORDER — ONDANSETRON HCL 4 MG/2ML IJ SOLN
4.0000 mg | Freq: Four times a day (QID) | INTRAMUSCULAR | Status: DC | PRN
  Administered 2019-02-05: 4 mg via INTRAVENOUS
  Filled 2019-02-05: qty 2

## 2019-02-05 MED ORDER — DIATRIZOATE MEGLUMINE & SODIUM 66-10 % PO SOLN
30.0000 mL | Freq: Once | ORAL | Status: DC
  Filled 2019-02-05: qty 30

## 2019-02-05 MED ORDER — FLEET ENEMA 7-19 GM/118ML RE ENEM
1.0000 | ENEMA | Freq: Once | RECTAL | Status: AC
  Administered 2019-02-06: 06:00:00 1 via RECTAL
  Filled 2019-02-05: qty 1

## 2019-02-05 MED ORDER — FLEET ENEMA 7-19 GM/118ML RE ENEM
1.0000 | ENEMA | Freq: Once | RECTAL | Status: AC
  Administered 2019-02-06: 1 via RECTAL

## 2019-02-05 MED ORDER — NICOTINE 21 MG/24HR TD PT24
21.0000 mg | MEDICATED_PATCH | Freq: Every day | TRANSDERMAL | Status: DC
  Administered 2019-02-05: 21 mg via TRANSDERMAL
  Filled 2019-02-05 (×3): qty 1

## 2019-02-05 MED ORDER — MORPHINE SULFATE (PF) 4 MG/ML IV SOLN
4.0000 mg | INTRAVENOUS | Status: DC | PRN
  Administered 2019-02-05 (×2): 4 mg via INTRAVENOUS
  Filled 2019-02-05 (×2): qty 1

## 2019-02-05 MED ORDER — DIATRIZOATE MEGLUMINE & SODIUM 66-10 % PO SOLN
ORAL | Status: AC
  Filled 2019-02-05: qty 30

## 2019-02-05 MED ORDER — ENOXAPARIN SODIUM 40 MG/0.4ML ~~LOC~~ SOLN
40.0000 mg | Freq: Every day | SUBCUTANEOUS | Status: AC
  Administered 2019-02-05 – 2019-02-09 (×4): 40 mg via SUBCUTANEOUS
  Filled 2019-02-05 (×5): qty 0.4

## 2019-02-05 MED ORDER — DEXTROSE-NACL 5-0.9 % IV SOLN
INTRAVENOUS | Status: DC
  Administered 2019-02-05 – 2019-02-07 (×2): via INTRAVENOUS

## 2019-02-05 NOTE — Consult Note (Signed)
Referring Provider:  Dr. Burr Medico Primary Care Physician:  Street, [name], MD Primary Gastroenterologist:  Dr. Therisa Doyne  Reason for Consultation: Colonic obstruction, metastatic pancreatic cancer  HPI: Ernest Wu is a 58 y.o. male with past medical history of pancreatic cancer diagnosed in November 2019, initially diagnosed with stage Ib, he was scheduled for Whipple surgery in April 2020 but was found to have liver mets and surgery was aborted.  He had a cholecystectomy at that time and liver biopsy was performed.  Subsequently developed abscess at the surgical site with gram-negative bacteremia .  He was treated with antibiotics and temporary JP drain of abscess.  He has been having intermittent abdominal pain since then. Around 2 weeks ago, he started having worsening nausea and vomiting.  This was followed by worsening abdominal pain and abdominal distention.  No bowel movement for 2 weeks.Underwent paracentesis on January 30, 2019 with cytology positive for metastatic adenocarcinoma.  CT scan February 01, 2019 showed mild colonic distention.  Because of ongoing worsening pain he came to the hospital for further management.  CT abdomen pelvis with rectal contrast showed mildly dilation of colon with cecum up to 10.3 cm with collapsed segment of rectosigmoid colon with possible stricture but contrast was able to pass through this segment into the upstream colon.  CT scan also showed nodular infiltration of the omentum and mesentery concerning for carcinomatosis.  He was seen and examined at bedside.  He continues to have abdominal distention.  Currently feeling somewhat better with NG suction.  As mentioned earlier, he had no bowel movement for 2 weeks.  Past Medical History:  Diagnosis Date  . Anxiety   . Family history of breast cancer   . Family history of colonic polyps   . Family history of pancreatic cancer   . Family history of prostate cancer   . GERD (gastroesophageal reflux disease)   .  pancreatic ca 06/2018   Pancreatic    Past Surgical History:  Procedure Laterality Date  . BILIARY STENT PLACEMENT  06/10/2018   Procedure: BILIARY STENT PLACEMENT;  Surgeon: Ronnette Juniper, MD;  Location: [facility] ENDOSCOPY;  Service: Gastroenterology;;  . CHOLECYSTECTOMY N/A 11/10/2018   Procedure: Laparoscopic Cholecystectomy;  Surgeon: Stark Klein, MD;  Location: [place];  Service: General;  Laterality: N/A;  . DIAGNOSTIC LAPAROSCOPIC LIVER BIOPSY N/A 11/10/2018   Procedure: Diagnostic Laparoscopic Liver Biopsy;  Surgeon: Stark Klein, MD;  Location: [place];  Service: General;  Laterality: N/A;  . ERCP N/A 06/10/2018   Procedure: ENDOSCOPIC RETROGRADE CHOLANGIOPANCREATOGRAPHY (ERCP);  Surgeon: Ronnette Juniper, MD;  Location: [place];  Service: Gastroenterology;  Laterality: N/A;  . ESOPHAGOGASTRODUODENOSCOPY (EGD) WITH PROPOFOL N/A 06/10/2018   Procedure: ESOPHAGOGASTRODUODENOSCOPY (EGD) WITH PROPOFOL;  Surgeon: Ronnette Juniper, MD;  Location: [place];  Service: Gastroenterology;  Laterality: N/A;  . FINE NEEDLE ASPIRATION  06/10/2018   Procedure: FINE NEEDLE ASPIRATION (FNA) LINEAR;  Surgeon: Ronnette Juniper, MD;  Location: [place];  Service: Gastroenterology;;  . IR RADIOLOGIST EVAL & MGMT  12/10/2018  . PORT-A-CATH REMOVAL N/A 11/10/2018   Procedure: REMOVAL PORT-A-CATH;  Surgeon: Stark Klein, MD;  Location: [place];  Service: General;  Laterality: N/A;  . PORTACATH PLACEMENT N/A 07/02/2018   Procedure: INSERTION PORT-A-CATH;  Surgeon: Stark Klein, MD;  Location: [place];  Service: General;  Laterality: N/A;  . SPHINCTEROTOMY  06/10/2018   Procedure: SPHINCTEROTOMY;  Surgeon: Ronnette Juniper, MD;  Location: [facility] ENDOSCOPY;  Service: Gastroenterology;;  . UPPER ESOPHAGEAL ENDOSCOPIC ULTRASOUND (EUS) N/A 06/10/2018  Procedure: UPPER ESOPHAGEAL ENDOSCOPIC ULTRASOUND (EUS);  Surgeon: Ronnette Juniper, MD;  Location: [place];  Service: Gastroenterology;  Laterality: N/A;  . VASECTOMY      Prior to Admission  medications   Medication Sig Start Date End Date Taking? Authorizing Provider  ALPRAZolam (XANAX) 0.25 MG tablet Take 0.25 mg by mouth 2 (two) times daily as needed for anxiety.    [provider]  diphenoxylate-atropine (LOMOTIL) 2.5-0.025 MG tablet Take 2 tablets by mouth 4 (four) times daily as needed for diarrhea or loose stools. Patient not taking: Reported on 12/26/2018 06/27/18   Alla Feeling, NP  docusate sodium (COLACE) 100 MG capsule Take 200 mg by mouth daily as needed for mild constipation.    [provider]  esomeprazole (NEXIUM) 20 MG capsule Take 20 mg by mouth daily after breakfast.     [provider]  folic acid (FOLVITE) 1 MG tablet Take 1 tablet (1 mg total) by mouth daily after breakfast. 12/08/18   [name], MD  lactulose (CHRONULAC) 10 GM/15ML solution Take 45 mLs (30 g total) by mouth 3 (three) times daily. Patient taking differently: Take 30 g by mouth 2 (two) times daily as needed for moderate constipation.  02/02/19   Harle Stanford., PA-C  magic mouthwash w/lidocaine SOLN Take 5 mLs by mouth 4 (four) times daily as needed for mouth pain. 08/22/18   Tanner, Lyndon Code., PA-C  Methylcobalamin (B-12) 5000 MCG TBDP Take 5,000 mcg by mouth daily.    [provider]  ondansetron (ZOFRAN) 8 MG tablet Take 1 tablet (8 mg total) by mouth every 8 (eight) hours as needed for nausea or vomiting. 12/22/18   Alla Feeling, NP  oxyCODONE (OXY IR/ROXICODONE) 5 MG immediate release tablet Take 5 mg by mouth every 4 (four) hours as needed for severe pain.    [provider]  polyethylene glycol (MIRALAX / GLYCOLAX) 17 g packet Take 17 g by mouth daily as needed for mild constipation.     [provider]  prochlorperazine (COMPAZINE) 10 MG tablet Take 1 tablet (10 mg total) by mouth every 6 (six) hours as needed (NAUSEA). 01/19/19   [name], MD  rivaroxaban (XARELTO) 20 MG TABS tablet Take 1 tablet (20 mg total) by mouth daily after  breakfast. 01/08/19   [name], MD  simethicone (MYLICON) 259 MG chewable tablet Chew 250 mg by mouth every 6 (six) hours as needed for flatulence.     [provider]  Syringe, Disposable, (10-12CC SYRINGE) 12 ML MISC 10 mLs by Does not apply route as needed. 12/04/18   Alla Feeling, NP  traMADol (ULTRAM) 50 MG tablet Take 50 mg by mouth every 6 (six) hours as needed (pain).     [provider]    Scheduled Meds: . diatrizoate meglumine-sodium  30 mL Oral Once  . diatrizoate meglumine-sodium      . enoxaparin (LOVENOX) injection  40 mg Subcutaneous Daily  . nicotine  21 mg Transdermal Daily  . sodium phosphate  1 enema Rectal Once  . [START ON 02/06/2019] sodium phosphate  1 enema Rectal Once  . [START ON 02/06/2019] sodium phosphate  1 enema Rectal Once   Continuous Infusions: . dextrose 5 % and 0.9% NaCl 125 mL/hr at 02/05/19 0243   PRN Meds:.morphine injection, ondansetron **OR** ondansetron (ZOFRAN) IV  Allergies as of 02/04/2019 - Review Complete 02/04/2019  Allergen Reaction Noted  . Contrast media [iodinated diagnostic agents] Other (See Comments) 06/07/2018  .  Iohexol Anaphylaxis, Itching, and Rash 06/07/2018    Family History  Problem Relation Age of Onset  . Stroke Mother   . Hypertension Mother   . CAD Father        CABG  . Pancreatic cancer Paternal Grandfather   . Breast cancer Maternal Aunt   . Lymphoma Paternal Uncle   . Other Maternal Uncle        [name]  . Stroke Maternal Grandmother   . Prostate cancer Cousin        pat first cousin, dx between 61-60    Social History   Socioeconomic History  . Marital status: Married    Spouse name: Not on file  . Number of children: 5  . Years of education: Not on file  . Highest education level: Not on file  Occupational History  . Not on file  Social Needs  . Financial resource strain: Not on file  . Food insecurity    Worry: Not on file    Inability: Not on file  . Transportation  needs    Medical: Not on file    Non-medical: Not on file  Tobacco Use  . Smoking status: Current Every Day Smoker    Packs/day: 0.25    Years: 30.00    Pack years: 7.50  . Smokeless tobacco: Never Used  . Tobacco comment: cut back considerably  Substance and Sexual Activity  . Alcohol use: Yes    Comment: 6-7 beers Q week  . Drug use: Never  . Sexual activity: Not on file  Lifestyle  . Physical activity    Days per week: Not on file    Minutes per session: Not on file  . Stress: Not on file  Relationships  . Social Herbalist on phone: Not on file    Gets together: Not on file    Attends religious service: Not on file    Active member of club or organization: Not on file    Attends meetings of clubs or organizations: Not on file    Relationship status: Not on file  . Intimate partner violence    Fear of current or ex partner: Not on file    Emotionally abused: Not on file    Physically abused: Not on file    Forced sexual activity: Not on file  Other Topics Concern  . Not on file  Social History Narrative  . Not on file    Review of Systems: Review of Systems  Constitutional: Positive for malaise/fatigue. Negative for chills.  HENT: Negative for hearing loss and tinnitus.   Eyes: Negative for blurred vision and double vision.  Respiratory: Negative for cough and hemoptysis.   Cardiovascular: Negative for chest pain and palpitations.  Gastrointestinal: Positive for abdominal pain, constipation, heartburn, nausea and vomiting. Negative for blood in stool and melena.  Genitourinary: Negative for dysuria and urgency.  Musculoskeletal: Positive for back pain and myalgias.  Skin: Negative for itching and rash.  Neurological: Negative for seizures and loss of consciousness.  Endo/Heme/Allergies: Does not bruise/bleed easily.  Psychiatric/Behavioral: Negative for substance abuse. The patient is not nervous/anxious.     Physical Exam: Vital signs: Vitals:    02/05/19 0500 02/05/19 0904  BP: (!) 131/93 125/81  Pulse: 98 89  Resp: 14 18  Temp: 98.6 F (37 C) (!) 97.5 F (36.4 C)  SpO2: 100% 97%   Last BM Date: 01/30/19 Physical Exam  Constitutional: He is oriented to person, place, and  time. He appears well-nourished. No distress.  HENT:  Head: Normocephalic and atraumatic.  Mouth/Throat: No oropharyngeal exudate.  NG tube in place  Eyes: EOM are normal. No scleral icterus.  Neck: Normal range of motion. Neck supple.  Cardiovascular: Normal rate and regular rhythm.  Pulmonary/Chest: Breath sounds normal. No respiratory distress.  Abdominal: Soft. Bowel sounds are normal. He exhibits distension. There is abdominal tenderness. There is no rebound and no guarding.  Abdomen is significantly distended and tense.  Musculoskeletal:        General: No edema.  Neurological: He is alert and oriented to person, place, and time.  Skin: Skin is warm. No erythema.  Psychiatric: He has a normal mood and affect. Judgment and thought content normal.  Nursing note and vitals reviewed.   GI:  Lab Results: Recent Labs    02/04/19 2212 02/05/19 0515  WBC 12.4* 9.0  HGB 12.9* 11.3*  HCT 39.2 34.0*  PLT 467* 419*   BMET Recent Labs    02/04/19 2212 02/05/19 0515  NA 130* 131*  K 4.4 4.2  CL 92* 95*  CO2 26 26  GLUCOSE 127* 138*  BUN 7 8  CREATININE 0.81 0.73  CALCIUM 8.8* 8.1*   LFT Recent Labs    02/05/19 0515  PROT 5.3*  ALBUMIN 2.3*  AST 25  ALT 35  ALKPHOS 93  BILITOT 0.5   PT/INR No results for input(s): LABPROT, INR in the last 72 hours.   Studies/Results: Ct Abdomen Pelvis Wo Contrast  Result Date: 02/05/2019 CLINICAL DATA:  Bowel obstruction EXAM: CT ABDOMEN AND PELVIS WITHOUT CONTRAST TECHNIQUE: Multidetector CT imaging of the abdomen and pelvis was performed following the standard protocol without IV contrast. COMPARISON:  Radiograph 02/04/2019, CT 02/01/2019, 12/26/2018, 12/10/2018 FINDINGS: Lower chest: Lung  bases demonstrate no acute consolidation or effusion. Stable nodular foci at the right middle lobe. Heart size upper normal. Esophageal tube within the distal esophagus, the tip terminates at the distal stomach. Hepatobiliary: No focal hepatic abnormality. Small amount of pneumobilia. Distal common duct stent. Pancreas: Atrophic.  No ductal dilatation Spleen: Normal in size without focal abnormality. Adrenals/Urinary Tract: Adrenal glands are unremarkable. Kidneys are normal, without renal calculi, focal lesion, or hydronephrosis. Bladder is slightly thick walled. Stomach/Bowel: The stomach is nonenlarged. There is no dilated small bowel. Diffusely enlarged colon, contains contrast. Cecal dilatation up 10.3 cm. This appears increased. Transverse colon dilated up to 7.1 cm. Irregular decompressed appearance of the rectosigmoid colon. Small amount of contrast within the rectum. Reflux contrast into slightly dilated distal small bowel. Vascular/Lymphatic: Mild aortic atherosclerosis without aneurysm. Reproductive: Prostate is unremarkable. Other: No free air. Moderate volume of ascites within the abdomen with small amount of ascites in the pelvis. Diffuse nodular infiltration of the omentum suspicious for carcinomatosis. Fat within the inguinal canals. Musculoskeletal: No acute or suspicious osseous abnormality. IMPRESSION: 1. Marked dilatation of the colon with cecal dilatation up to 10.3 cm, appears increased as compared with prior CT. Irregular collapsed segment of rectosigmoid colon, raises possibility of stricture. Contrast was administered rectally and did manage to pass this segment of bowel into the upstream colon. 2. Moderate ascites within the abdomen with nodular infiltration of the omentum and mesentery, concerning for carcinomatosis. 3. Small amount of pneumobilia, presumably related to distal common duct stent. Electronically Signed   By: Donavan Foil M.D.   On: 02/05/2019 04:05   Dg Abd Acute  W/chest  Result Date: 02/04/2019 CLINICAL DATA:  Abdominal pain EXAM: DG ABDOMEN ACUTE W/ 1V  CHEST COMPARISON:  CT [id number] FINDINGS: Metallic stent noted in the right abdomen, likely biliary. Marked gaseous distention of the transverse colon with air-fluid levels. Pneumobilia noted. Prior cholecystectomy. No free air organomegaly. Heart is normal size. Lungs clear. No effusions or acute bony abnormality. IMPRESSION: Prior cholecystectomy with biliary stent and pneumobilia. Marked gaseous distention of the colon, most notable in the transverse colon with air-fluid levels, similar to prior CT. Electronically Signed   By: Rolm Baptise M.D.   On: 02/04/2019 22:50   Dg Abd Portable 1 View  Result Date: 02/04/2019 CLINICAL DATA:  NG tube placement EXAM: PORTABLE ABDOMEN - 1 VIEW COMPARISON:  02/04/2019 FINDINGS: NG tube has been placed with the tip in the distal stomach. Gaseous distention of the colon, predominantly transverse colon again noted, unchanged. IMPRESSION: NG tube tip in the distal stomach. Electronically Signed   By: Rolm Baptise M.D.   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  -Metastatic pancreatic adenocarcinoma.  Recommendations -------------------------- -CT scan image reviewed personally.  Case discussed with Dr. Watt Climes.  -  Plan for possible colonic stent placement tomorrow.  Procedure, risk, benefits and alternatives were discussed with the patient in detail.  Risk of stent migration, perforation, infection, bleeding as well as risk of anesthesia discussed with the patient.    - If not able to place stent, he may need diverting colostomy. -He will need 1 Fleet enema today and 2 Fleet enemas tomorrow morning. -he  will remain n.p.o. for now. continue  NG suction.    LOS: 1 day   Otis Brace  MD, FACP 02/05/2019, 10:29  AM  Contact #  [phone]

## 2019-02-05 NOTE — Progress Notes (Signed)
Pt refusing to take gastrografin at this time. Pt concerned about having an allergic reaction.

## 2019-02-05 NOTE — Progress Notes (Addendum)
HEMATOLOGY-ONCOLOGY PROGRESS NOTE  SUBJECTIVE: Ernest Wu is currently followed at the [facility] for metastatic pancreatic adenocarcinoma.  His last dose of chemotherapy was given on 01/19/2019.  He has been on a break from his chemotherapy since that time due to poor tolerance.  The patient was referred to hospice of [facility] yesterday due to continued decline.  He was subsequently admitted to the hospital on 02/04/2019 due to abdominal pain, nausea, vomiting.  A CT of the abdomen and pelvis showed marked dilatation of the colon with cecal dilatation up to 10.3 cm which had appeared increases the prior CT.  There was also irregular collapsed segment of the rectosigmoid colon, raising possibility of stricture.  Contrast was administered rectally and did manage to pass the segment of the bowel into the upstream colon.  He was also found to have moderate ascites within the abdomen with nodular infiltration of the omentum and mesentery concerning for carcinomatosis.  GI has been consulted for his colonic obstruction.  Plan is for a colonic stent placement tomorrow.  If GI is unable to place a stent, he may need a diverting colostomy.  When seen today, the patient reports that his nausea and vomiting have resolved.  He has an NG tube in place.  He has not really been able to eat or drink for at least a week.  Reports moderate abdominal pain and swelling. No recent bowel movement. He is hopeful that he will feel better after his procedure tomorrow.  Today, he reports that he is unsure if he wants to enroll in hospice upon discharge.  Oncology History Overview Note  Cancer Staging Adenocarcinoma of pancreas [facility]) Staging form: Exocrine Pancreas, AJCC 8th Edition - Clinical stage from 06/10/2018: Stage IB (cT2, cN0, cM0) - Signed by [name], MD on 06/29/2018     Adenocarcinoma of pancreas ([place])  06/07/2018 Imaging   CT AP w Contrast IMPRESSION: 1. There is a low attenuation mass centered around the  neck of pancreas which is concerning for neoplasm. Pancreatic adenocarcinoma favored. This results in common bile duct obstruction with mild intrahepatic biliary ductal dilatation. There also is involvement of the portal venous confluence. Further evaluation with nonemergent contrast enhanced MRI of the pancreas is recommended. 2. Small indeterminate low-attenuation structure is noted within segment 4 a of the liver. This could be better addressed at MRI.   06/09/2018 Imaging   MR ABD MRCP  The pancreatic mass involves approximately 40 percent of the main portal vein circumference at the portal splenic venous confluence, with associated mild narrowing of the portal splenic venous confluence. The SMV, splenic vein and main, right and left portal veins remain patent. The celiac trunk and SMA are not involved by the pancreatic mass.  IMPRESSION: 2.3 cm diameter hypoenhancing mass at the junction of the head and neck of pancreas with pancreatic ductal dilatation, likely adenocarcinoma.  Intra and extrahepatic bile duct dilatation with abrupt change in caliber at the mid common bile duct. This could indicate an obstructing mass or stricture.    06/10/2018 Initial Biopsy   Diagnosis PANCREAS, FINE NEEDLE ASPIRATION (SPECIMEN 1 OF 1 COLLECTED 06/10/18): MALIGNANT CELLS CONSISTENT WITH ADENOCARCINOMA.   06/10/2018 Procedure   IMPRESSION: 1. 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 2. Placement of a metallic biliary stent.   06/10/2018 Procedure   EUS per Dr. Paulita Fujita Impression:  - There was dilation in the common bile duct which measured up to 12 mm. -  A mass was identified in the pancreatic head. This was staged T3 N1 Mx by endosonographic criteria. Fine needle aspiration performed. - A few lymph nodes were visualized and measured in the peripancreatic region. - There was no evidence of significant pathology in the left lobe of the liver.    06/10/2018 Cancer Staging   Staging form: Exocrine Pancreas, AJCC 8th Edition - Clinical stage from 06/10/2018: Stage IB (cT2, cN0, cM0) - Signed by [name], MD on 06/29/2018   06/27/2018 Initial Diagnosis   Adenocarcinoma of pancreas ([place])   07/02/2018 Imaging   IMPRESSION: 1. Multiple small pulmonary nodules scattered throughout the lungs measuring up to 7 mm in size. These are nonspecific, but the possibility of metastatic disease should be considered, and close attention at time of routine followups is recommended. 2. In addition, today's study demonstrates new and enlarging low-attenuation lesions in the liver. This is poorly evaluated on today's noncontrast CT examination, but is concerning for potential metastatic disease. Further evaluation with repeat nonemergent MRI of the abdomen with and without IV gadolinium is suggested in the near future to better evaluate these findings. 3. Aortic atherosclerosis, in addition to left main and 2 vessel coronary artery disease. Please note that although the presence of coronary artery calcium documents the presence of coronary artery disease, the severity of this disease and any potential stenosis cannot be assessed on this non-gated CT examination. Assessment for potential risk factor modification, dietary therapy or pharmacologic therapy may be warranted, if clinically indicated. 4. Mild aneurysmal dilatation of the ascending thoracic aorta (4.8 cm in diameter). Ascending thoracic aortic aneurysm. Recommend semi-annual imaging followup by CTA or MRA and referral to cardiothoracic surgery if not already obtained. This recommendation follows 2010 ACCF/AHA/AATS/ACR/ASA/SCA/SCAI/SIR/STS/SVM Guidelines for the Diagnosis and Management of Patients With Thoracic Aortic Disease. Circulation. 2010; 121: G017-C944.  Aortic Atherosclerosis (ICD10-I70.0). Aortic aneurysm NOS (ICD10-I71.9).   07/05/2018 - 10/29/2018 Chemotherapy     FOLFIRINOX q2 weeks   07/15/2018 Imaging   07/15/2018 Liver US IMPRESSION: No liver lesions identified with ultrasound. Ultrasound-guided biopsy was not performed. Recommend further characterization for liver lesions with a repeat MRI, with and without contrast.   07/24/2018 Imaging   07/24/2018 MRI Abdomen IMPRESSION: 1. The hypoenhancing mass at the junction of the pancreatic body and head has reduced in size, previously 3.2 by 2.8 cm and currently 2.9 by 2.2 cm. A small peripancreatic lymph node adjacent to the mass was previously 0.9 cm in short axis and is currently 0.7 cm in short axis. The amount of contact between the pancreatic mass and the confluence of the splenic vein and SMV is similar to the prior exam. 2. There is a 6 mm probable hemangioma in segment 4a of the liver, based on the delayed enhancement pattern. This is somewhat ill-defined. The lesion appeared larger on the prior noncontrast CT but presumably may have been overestimated on that exam. There is also some hypodensity along the dome of the right hepatic lobe which appears to most likely be due to a slip of the diaphragm rather than a discernible lesion on MRI. 3. Aortic Atherosclerosis (ICD10-I70.0). Notably, there is a small amount of mural thrombus along the right side of the abdominal aorta below the right renal artery level which has not been seen previously.   09/03/2018 Imaging   09/03/2018 MRI Abdomen IMPRESSION: 1. Stable mass (adenocarcinoma) in the neck of the pancreas with upstream duct dilatation. 2. No evidence of lymphadenopathy in the porta hepatis or peripancreatic fat. 3. No  evidence hepatic metastasis. 4. Mild biliary duct dilatation LEFT hepatic lobe similar to comparison exam. Biliary stent within the common bile duct.   09/16/2018 Imaging   MR MRA CHEST W WO CONTRAST  IMPRESSION: VASCULAR  1. Thrombus in the central left subclavian vein and innominate vein, at least partially  occlusive. 2. Left subclavian port catheter to the SVC. 3. SVC is patent     10/24/2018 Imaging   MRI abdomen  IMPRESSION: 1. Slight interval decrease in size of the pancreatic mass. 2. Stable to slightly smaller adjacent lymph nodes. 3. No findings for metastatic disease involving the liver or lung bases. 4. Common bile duct stent in good position without complicating features.   11/10/2018 Surgery   PAC removal, Laparoscopic Cholecystectomy and Diagnostic Laparoscopic Liver Biopsy by Dre. Byerly  and Dr. Lucia Gaskins  11/10/18    11/10/2018 Pathology Results   Diagnosis 11/10/18 1. Liver, biopsy, Left - ADENOCARCINOMA. - SEE COMMENT. 2. Gallbladder - CHRONIC CHOLECYSTITIS WITH CHOLELITHIASIS. - ONE MORPHOLOGICALLY BENIGN LYMPH NODE.   12/10/2018 Imaging   CT AP  IMPRESSION: 1. Interval resolution of gallbladder fossa abscess. The percutaneous drainage catheter remains in good position. 2. Patent metallic biliary stent with expected pneumobilia. 3. Decreasing size of low-attenuation in hepatic segment 5 adjacent to the gallbladder fossa likely representing a small resolving intrahepatic abscess. 4. Additional ancillary findings as above without significant interval change.   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  The CustomNext-Expanded gene panel offered by [facility] and includes sequencing and rearrangement analysis for the following 81 genes: AIP, ALK, APC*, ATM*, AXIN2, BAP1, BARD1, BLM, BMPR1A, BRCA1*, BRCA2*, BRIP1*, CDC73, CDH1*, CDK4, CDKN1B, CDKN2A, CHEK2*, CTNNA1, DICER1, FANCC, FH, FLCN, GALNT12, HOXB13, KIT, MAX, MEN1, MET, MLH1*, MRE11A, MSH2*, MSH6*, MUTYH*, NBN, NF1*, NF2, NTHL1, PALB2*, PDGFRA, PHOX2B, PMS2*, POLD1, POLE, POT1, PRKAR1A, PTCH1, PTEN*, RAD50, RAD51C*, RAD51D*, RB1, RET, SDHA, SDHAF2, SDHB, SDHC, SDHD, SMAD4, SMARCA4, SMARCB1, SMARCE1, STK11, SUFU, TMEM127, TP53*, TSC1, TSC2, VHL  and XRCC2 (sequencing and deletion/duplication); CASR, CFTR, CPA1, CTRC, EGFR, MITF, PRSS1 and SPINK1 (sequencing only); EPCAM and GREM1 (deletion/duplication only). DNA and RNA analyses performed for * genes. The report date is Dec 19, 2018.   12/22/2018 -  Chemotherapy   second-line chemo with Gemcitabine and Abraxane 2-3 weeks on/1 week off starting 12/22/18 which single agent gemcitabine for cycle 1. Added Abraxane with cycle C1D8.     12/26/2018 Imaging   CT AP IMPRESSION: 1. No evidence of bowel obstruction. 2. New small amount of ascites with increased nodular soft tissue stranding throughout the omental fat. This could be inflammatory and indicate peritonitis. Early peritoneal carcinomatosis would be another explanation. 3. Patent biliary stent and persistent pneumobilia. No residual fluid collection identified in the cholecystectomy bed following percutaneous drain removal.   01/08/2019 Imaging   CT Chest 01/08/19 IMPRESSION: 1. Three small (less than 5 mm) pulmonary nodules in the RIGHT lower lobe are not identified on comparison CT from 07/02/2018 and therefore concerning for metastatic nodules.   2. Mild aneurysmal dilatation of the ascending thoracic aorta. Recommend annual imaging followup by CTA or MRA. This recommendation follows 2010 ACCF/AHA/AATS/ACR/ASA/SCA/SCAI/SIR/STS/SVM Guidelines for the Diagnosis and Management of Patients with Thoracic Aortic Disease. Circulation. 2010; 121: R485-I627. Aortic aneurysm NOS (ICD10-I71.9)      REVIEW OF SYSTEMS:   Pertinent positives are noted in the HPI.  I have reviewed the past medical history, past surgical history, social history and family history with the patient and  they are unchanged from previous note.   PHYSICAL EXAMINATION: ECOG PERFORMANCE STATUS: 2 - Symptomatic, <50% confined to bed  Vitals:   02/05/19 0500 02/05/19 0904  BP: (!) 131/93 125/81  Pulse: 98 89  Resp: 14 18  Temp: 98.6 F (37 C) (!) 97.5 F  (36.4 C)  SpO2: 100% 97%   Filed Weights   02/04/19 2254  Weight: 186 lb 4.6 oz (84.5 kg)    Intake/Output from previous day: No intake/output data recorded.  GENERAL:alert, no distress and comfortable SKIN: skin color, texture, turgor are normal, no rashes or significant lesions EYES: normal, Conjunctiva are pink and non-injected, sclera clear OROPHARYNX: No thrush or mucositis.  NG tube in place. NECK: supple, thyroid normal size, non-tender, without nodularity LYMPH:  no palpable lymphadenopathy in the cervical, axillary or inguinal LUNGS: clear to auscultation and percussion with normal breathing effort HEART: regular rate & rhythm and no murmurs and no lower extremity edema ABDOMEN: Positive bowel sounds.  Significant abdominal distention and tenderness.  No rebound or guarding. Musculoskeletal:no cyanosis of digits and no clubbing  NEURO: alert & oriented x 3 with fluent speech, no focal motor/sensory deficits  LABORATORY DATA:  I have reviewed the data as listed CMP Latest Ref Rng & Units 02/05/2019 02/04/2019 02/01/2019  Glucose 70 - 99 mg/dL 138(H) 127(H) 141(H)  BUN 6 - 20 mg/dL _0 Creatinine 0.61 - 1.24 mg/dL 0.73 0.81 0.67  Sodium 135 - 145 mmol/L 131(L) 130(L) 135  Potassium 3.5 - 5.1 mmol/L 4.2 4.4 4.4  Chloride 98 - 111 mmol/L 95(L) 92(L) 99  CO2 22 - 32 mmol/L _1 Calcium 8.9 - 10.3 mg/dL 8.1(L) 8.8(L) 8.7(L)  Total Protein 6.5 - 8.1 g/dL 5.3(L) 6.1(L) 6.7  Total Bilirubin 0.3 - 1.2 mg/dL 0.5 0.8 0.1(L)  Alkaline Phos 38 - 126 U/L 93 100 116  AST 15 - 41 U/L _2 ALT 0 - 44 U/L 35 41 39    Lab Results  Component Value Date   WBC 9.0 02/05/2019   HGB 11.3 (L) 02/05/2019   HCT 34.0 (L) 02/05/2019   MCV 84.2 02/05/2019   PLT 419 (H) 02/05/2019   NEUTROABS 9.7 (H) 02/04/2019    Ct Abdomen Pelvis Wo Contrast  Result Date: 02/05/2019 CLINICAL DATA:  Bowel obstruction EXAM: CT ABDOMEN AND PELVIS WITHOUT CONTRAST TECHNIQUE: Multidetector CT  imaging of the abdomen and pelvis was performed following the standard protocol without IV contrast. COMPARISON:  Radiograph 02/04/2019, CT 02/01/2019, 12/26/2018, 12/10/2018 FINDINGS: Lower chest: Lung bases demonstrate no acute consolidation or effusion. Stable nodular foci at the right middle lobe. Heart size upper normal. Esophageal tube within the distal esophagus, the tip terminates at the distal stomach. Hepatobiliary: No focal hepatic abnormality. Small amount of pneumobilia. Distal common duct stent. Pancreas: Atrophic.  No ductal dilatation Spleen: Normal in size without focal abnormality. Adrenals/Urinary Tract: Adrenal glands are unremarkable. Kidneys are normal, without renal calculi, focal lesion, or hydronephrosis. Bladder is slightly thick walled. Stomach/Bowel: The stomach is nonenlarged. There is no dilated small bowel. Diffusely enlarged colon, contains contrast. Cecal dilatation up 10.3 cm. This appears increased. Transverse colon dilated up to 7.1 cm. Irregular decompressed appearance of the rectosigmoid colon. Small amount of contrast within the rectum. Reflux contrast into slightly dilated distal small bowel. Vascular/Lymphatic: Mild aortic atherosclerosis without aneurysm. Reproductive: Prostate is unremarkable. Other: No free air. Moderate volume of ascites within the abdomen with small amount of ascites in the pelvis. Diffuse nodular  infiltration of the omentum suspicious for carcinomatosis. Fat within the inguinal canals. Musculoskeletal: No acute or suspicious osseous abnormality. IMPRESSION: 1. Marked dilatation of the colon with cecal dilatation up to 10.3 cm, appears increased as compared with prior CT. Irregular collapsed segment of rectosigmoid colon, raises possibility of stricture. Contrast was administered rectally and did manage to pass this segment of bowel into the upstream colon. 2. Moderate ascites within the abdomen with nodular infiltration of the omentum and mesentery,  concerning for carcinomatosis. 3. Small amount of pneumobilia, presumably related to distal common duct stent. Electronically Signed   By: Donavan Foil M.D.   On: 02/05/2019 04:05   Ct Abdomen Pelvis Wo Contrast  Result Date: 02/01/2019 CLINICAL DATA:  Abdominal distension, constipation, nausea, symptoms worse for 1 week, recent paracentesis, history pancreatic cancer, smoker EXAM: CT ABDOMEN AND PELVIS WITHOUT CONTRAST TECHNIQUE: Multidetector CT imaging of the abdomen and pelvis was performed following the standard protocol without IV contrast. Sagittal and coronal MPR images reconstructed from axial data set. Patient drank dilute oral contrast for exam COMPARISON:  12/26/2018 FINDINGS: Lower chest: Ill-defined nodular density LEFT lower lobe 4 mm diameter image 15 unchanged. Additional tiny subpleural density LEFT lower lobe laterally adjacent to major fissure image 14 unchanged. Hepatobiliary: Pneumobilia consistent likely due to noted distal CBD wall stent. Gallbladder surgically absent. No focal hepatic mass lesions. Pancreas: Atrophic pancreas. Fullness of pancreatic head versus body though no discrete mass or pancreatic ductal dilatation is seen. Spleen: Normal appearance Adrenals/Urinary Tract: Adrenal glands normal appearance. Kidneys, ureters, and bladder normal appearance Stomach/Bowel: Distended stomach. Diffuse colonic distension attending stool, with contrast in the RIGHT colon. Cecum 8.7 cm transverse. Rectosigmoid colon nondistended. Nonobstructed small bowel loops, oral contrast in RIGHT colon. Appendix not visualized. No definite bowel wall thickening. Vascular/Lymphatic: Atherosclerotic calcifications Reproductive: Mild prostatic enlargement. Seminal vesicles unremarkable. Other: Scattered ascites. Diffuse omental stranding increased from previous exam. Areas of thickening and slight nodularity at peritoneal surfaces. Findings are consistent with peritoneal carcinomatosis. BILATERAL inguinal  hernias containing fat. No free air. Tiny umbilical hernia containing fat. Interloop fluid. Musculoskeletal: No acute osseous lesions. IMPRESSION: Increased ascites. Increased omental infiltration with scattered areas of thickening and slight nodularity a peritoneal surfaces in the pelvis, consistent with peritoneal carcinomatosis. Mild colonic distention proximally without discrete obstruction though the sigmoid colon and rectum appear decompressed and there appear to be adjacent pelvic tumor deposits. Prior CBD stenting. Electronically Signed   By: Lavonia Dana M.D.   On: 02/01/2019 15:28   Ct Chest Wo Contrast  Result Date: 01/09/2019 CLINICAL DATA:  Pancreatic cancer diagnosed June 07, 2018. Chemotherapy ongoing. EXAM: CT CHEST WITHOUT CONTRAST TECHNIQUE: Multidetector CT imaging of the chest was performed following the standard protocol without IV contrast. COMPARISON:  CT 12/10/2018 FINDINGS: Cardiovascular: Ascending thoracic aorta is mildly aneurysmal at 42 mm. Coronary artery calcification and aortic atherosclerotic calcification. Mediastinum/Nodes: No axillary supraclavicular adenopathy. No mediastinal hilar adenopathy. No pericardial effusion. Esophagus normal Lungs/Pleura: Biapical pulmonary parenchymal thickening. Rounded 4 mm nodule in the RIGHT lower lobe (image 127/5) not identified on CT 11 27 19. Subpleural nodule in the more superior RIGHT lower lobe measuring 3 mm (85/5) is also not identified. Third small nodule in the RIGHT lower lobe measuring 3 mm on image 75 is also new. Less defined nodule in the LEFT lower lobe medially measuring 4 mm (image 118) is present on comparison exam. Upper Abdomen: Limited view of the upper abdomen demonstrates pneumobilia as expected. No inflammation or infection identified Musculoskeletal: No  aggressive osseous lesion. IMPRESSION: 1. Three small (less than 5 mm) pulmonary nodules in the RIGHT lower lobe are not identified on comparison CT from 07/02/2018  and therefore concerning for metastatic nodules. 2. Mild aneurysmal dilatation of the ascending thoracic aorta. Recommend annual imaging followup by CTA or MRA. This recommendation follows 2010 ACCF/AHA/AATS/ACR/ASA/SCA/SCAI/SIR/STS/SVM Guidelines for the Diagnosis and Management of Patients with Thoracic Aortic Disease. Circulation. 2010; 121: Q333-L456. Aortic aneurysm NOS (ICD10-I71.9) Electronically Signed   By: Suzy Bouchard M.D.   On: 01/09/2019 09:52   US Paracentesis  Result Date: 02/02/2019 INDICATION: Patient with history of metastatic pancreatic cancer, recurrent ascites. Request made for therapeutic paracentesis. EXAM: ULTRASOUND GUIDED THERAPEUTIC PARACENTESIS MEDICATIONS: None COMPLICATIONS: None immediate. PROCEDURE: Informed written consent was obtained from the patient after a discussion of the risks, benefits and alternatives to treatment. A timeout was performed prior to the initiation of the procedure. Initial ultrasound scanning demonstrates a small to moderate amount of ascites within the left lower abdominal quadrant. The left lower abdomen was prepped and draped in the usual sterile fashion. 1% lidocaine was used for local anesthesia. Following this, a 19 gauge, 7-cm, Yueh catheter was introduced. An ultrasound image was saved for documentation purposes. The paracentesis was performed. The catheter was removed and a dressing was applied. The patient tolerated the procedure well without immediate post procedural complication. FINDINGS: A total of approximately 1.9 liters of slightly hazy, yellow fluid was removed. IMPRESSION: Successful ultrasound-guided therapeutic paracentesis yielding 1.9 liters of peritoneal fluid. Read by: Rowe Robert, PA-C Electronically Signed   By: Sandi Mariscal M.D.   On: 02/02/2019 17:04   US Paracentesis  Result Date: 01/30/2019 INDICATION: Pancreatic cancer with ascites. Request for diagnostic and therapeutic paracentesis. EXAM: ULTRASOUND GUIDED  PARACENTESIS MEDICATIONS: 1% lidocaine 10 mL COMPLICATIONS: None immediate. PROCEDURE: Informed written consent was obtained from the patient after a discussion of the risks, benefits and alternatives to treatment. A timeout was performed prior to the initiation of the procedure. Initial ultrasound scanning demonstrates a moderate amount of ascites within the right lower abdominal quadrant. The right lower abdomen was prepped and draped in the usual sterile fashion. 1% lidocaine with epinephrine was used for local anesthesia. Following this, a 19 gauge, 7-cm, Yueh catheter was introduced. An ultrasound image was saved for documentation purposes. The paracentesis was performed. The catheter was removed and a dressing was applied. The patient tolerated the procedure well without immediate post procedural complication. FINDINGS: A total of approximately 2.5 L of clear yellow fluid was removed. Samples were sent to the laboratory as requested by the clinical team. IMPRESSION: Successful ultrasound-guided paracentesis yielding 2.5 liters of peritoneal fluid. Read by: Gareth Eagle PA-C Electronically Signed   By: Jerilynn Mages.  Shick M.D.   On: 01/30/2019 16:46   Dg Abd Acute W/chest  Result Date: 02/04/2019 CLINICAL DATA:  Abdominal pain EXAM: DG ABDOMEN ACUTE W/ 1V CHEST COMPARISON:  CT [id number] FINDINGS: Metallic stent noted in the right abdomen, likely biliary. Marked gaseous distention of the transverse colon with air-fluid levels. Pneumobilia noted. Prior cholecystectomy. No free air organomegaly. Heart is normal size. Lungs clear. No effusions or acute bony abnormality. IMPRESSION: Prior cholecystectomy with biliary stent and pneumobilia. Marked gaseous distention of the colon, most notable in the transverse colon with air-fluid levels, similar to prior CT. Electronically Signed   By: Rolm Baptise M.D.   On: 02/04/2019 22:50   Dg Abd Portable 1 View  Result Date: 02/04/2019 CLINICAL DATA:  NG tube placement EXAM:  PORTABLE  ABDOMEN - 1 VIEW COMPARISON:  02/04/2019 FINDINGS: NG tube has been placed with the tip in the distal stomach. Gaseous distention of the colon, predominantly transverse colon again noted, unchanged. IMPRESSION: NG tube tip in the distal stomach. Electronically Signed   By: Rolm Baptise M.D.   On: 02/04/2019 23:49    ASSESSMENT AND PLAN: 1.  Metastatic pancreatic cancer 2.  Colonic obstruction secondary to #1 3.  Nausea and vomiting secondary to obstruction, improved 4.  Malignant ascites 5.  Mild anemia secondary to recent chemotherapy 6.  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  Goals of care discussion, DNR/DNI  -The patient will undergo colonoscopy with colonic stent placement on 02/06/2019.  Appreciate GI assistance with this patient. -The patient requires intermittent paracentesis for his malignant ascites.  He is scheduled for placement of a catheter by interventional radiology on 02/09/2019 as an outpatient.  If he remains inpatient, we can ask interventional radiology to see him and perform this procedure as an inpatient. -Xarelto currently on hold secondary to pending procedure.  He is currently on Lovenox.  Will need to resume Xarelto after planned procedures are complete. -I talked with the patient regarding his referral to hospice of [facility].  He seems undecided about whether he wishes to enroll in hospice.  Talk to him about having the palliative care team talk to him further about goals of care and he is agreeable.  I have placed this referral today.   LOS: 1 day   Mikey Bussing, DNP, AGPCNP-BC, AOCNP 02/05/19   Addendum  I have seen the patient, examined him. I agree with the assessment and and plan and have edited the notes.   I saw Kentarius in later afternoon today.  He had a 1 enema today, and responded well.  NG in place.  He overall feels better.  Appreciated GI input, he is scheduled to have colonic stent placement tomorrow, he understand the  risks and benefit, and agrees to proceed. We also discussed diverting colostomy if stent placement is not successful. We also discussed he may still develop bowel obstruction even after stent placement or diverting colostomy due to his diffuse peritoneal metastasis.  Again discussed the goal of care is palliative.  I called his wife and spoke with pt and his wife, both of them have agreed with hospice, they spoke with [facility] hospice program yesterday and plan to enroll after hospital discharge. Code status also discussed, he agrees with DNR/DNI, but OK with resuscitation if needed during procedure or surgery.   [name]  02/05/2019

## 2019-02-05 NOTE — Progress Notes (Signed)
These preliminary result these preliminary results were noted.  Awaiting final report.

## 2019-02-05 NOTE — ED Notes (Signed)
Attempted report, unable, has number for call back.

## 2019-02-05 NOTE — Progress Notes (Signed)
Patient Demographics:    Ernest Wu, is a 58 y.o. male, DOB - 03/11/1961, KDT:[id number]  Admit date - 02/04/2019   Admitting Physician Elwyn Reach, MD  Outpatient Primary MD for the patient is Street, [name], MD  LOS - 1   Chief Complaint  Patient presents with   Abdominal Pain        Subjective:    Ernest Wu today has no fevers, no emesis,  No chest pain, tolerating NG tube okay abdominal discomfort improving with NG tube in situ  Assessment  & Plan :    Principal Problem:   Intestinal obstruction ([place]) Active Problems:   Jaundice   Tobacco use   Adenocarcinoma of pancreas (HCC)   Nausea & vomiting  Brief Summary- 58 Yo with pmhx of GERD, tobacco abuse, previous biliary stent , prior cholecystectomy ,  Admitted on 02/04/2019 with abdominal pain nausea vomiting and found to have mild dilatation of the colon with cecal dilatation up to 10.3 cm     A/p 1)Bowel Obstruction--- currently has NG tube in place, GI consult appreciated plans colonoscopy with colonic stent placement on 02/23/2019, if unable to place stent patient will need diverting colostomy  2)Colonic obstruction  from 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 cancer--patient was initially scheduled for paratonia catheter placement on 02/09/2019 with interventional radiology as outpatient prior to his admission  4)Chronic Anticoagulation--- Xarelto  on hold to allow for procedures, continue prophylactic Lovenox  5)Social/Ethics--- full code,, as per oncology notes patient currently not interested in hospice  we will consult palliative care service  Disposition/Need for in-Hospital Stay- patient unable to be discharged at this time due to bowel obstruction requiring IV fluids NG tube and colonoscopic/surgical intervention  Code Status : Full code  Family Communication:   NA (patient is alert, awake and coherent)   Disposition Plan  : TBD  Consults  :  Gi/gen Surgery  DVT Prophylaxis  :  Lovenox -  SCDs ///Xarelto on hold  Lab Results  Component Value Date   PLT 419 (H) 02/05/2019    Inpatient Medications  Scheduled Meds:  diatrizoate meglumine-sodium  30 mL Oral Once   enoxaparin (LOVENOX) injection  40 mg Subcutaneous Daily   nicotine  21 mg Transdermal Daily   [START ON 02/06/2019] sodium phosphate  1 enema Rectal Once   [START ON 02/06/2019] sodium phosphate  1 enema Rectal Once   Continuous Infusions:  dextrose 5 % and 0.9% NaCl 125 mL/hr at 02/05/19 0243   PRN Meds:.morphine injection, ondansetron **OR** ondansetron (ZOFRAN) IV    Anti-infectives (From admission, onward)   None        Objective:   Vitals:   02/05/19 0245 02/05/19 0500 02/05/19 0904 02/05/19 1600  BP: (!) 131/93 (!) 131/93 125/81 129/86  Pulse: 98 98 89 84  Resp: 14 14 18 17   Temp: 98.6 F (37 C) 98.6 F (37 C) (!) 97.5 F (36.4 C) (!) 97.5 F (36.4 C)  TempSrc: Oral Oral  Oral Oral  SpO2:  100% 97% 98%  Weight:      Height:        Wt Readings from Last 3 Encounters:  02/04/19 84.5 kg  02/02/19 84.6 kg  01/30/19 86.5 kg     Intake/Output Summary (Last 24 hours) at 02/05/2019 1658 Last data filed at 02/05/2019 0800 Gross per 24 hour  Intake 0 ml  Output 350 ml  Net -350 ml     Physical Exam  Gen:- Awake Alert,  In no apparent distress  HEENT:- [place].AT, No sclera icterus Nose- NG Tube Neck-Supple Neck,No JVD,.  Lungs-  CTAB , fair symmetrical air movement CV- S1, S2 normal, regular  Abd-abdomen is less distended, generalized tenderness, palpable masses Extremity/Skin:- No   edema, pedal pulses present  Psych-affect is appropriate, oriented x3 Neuro-no new focal deficits, no tremors   Data Review:   Micro Results Recent Results (from the past 240 hour(s))  SARS Coronavirus 2 (CEPHEID - Performed in [place] hospital lab), Hosp Order     Status: None   Collection Time: 02/04/19 11:32 PM   Specimen: Nasopharyngeal Swab  Result Value Ref Range Status   SARS Coronavirus 2 NEGATIVE NEGATIVE Final    Comment: (NOTE) If result is NEGATIVE SARS-CoV-2 target nucleic acids are NOT DETECTED. The SARS-CoV-2 RNA is generally detectable in upper and lower  respiratory specimens during the acute phase of infection. The lowest  concentration of SARS-CoV-2 viral copies this assay can detect is 250  copies / mL. A negative result does not preclude SARS-CoV-2 infection  and should not be used as the sole basis for treatment or other  patient management decisions.  A negative result may occur with  improper specimen collection / handling, submission of specimen other  than nasopharyngeal swab, presence of viral mutation(s) within the  areas targeted by this assay, and inadequate number of viral copies  (<250 copies / mL). A negative result must be combined with clinical  observations, patient history, and epidemiological information. If result is POSITIVE SARS-CoV-2 target nucleic acids are DETECTED. The SARS-CoV-2 RNA is generally detectable in upper and lower  respiratory specimens dur ing the acute phase of infection.  Positive  results are indicative of active infection with SARS-CoV-2.  Clinical  correlation with patient history and other diagnostic information is  necessary to determine patient infection status.  Positive results do  not rule out bacterial infection or co-infection with other viruses. If result is PRESUMPTIVE POSTIVE SARS-CoV-2 nucleic acids MAY BE PRESENT.   A presumptive positive result was obtained on the submitted specimen  and confirmed  on repeat testing.  While 2019 novel coronavirus  (SARS-CoV-2) nucleic acids may be present in the submitted sample  additional confirmatory testing may be necessary for epidemiological  and / or clinical management purposes  to differentiate between  SARS-CoV-2 and other Sarbecovirus currently known to infect humans.  If clinically indicated additional testing with an alternate test  methodology [phone]) is advised. The SARS-CoV-2 RNA is generally  detectable in upper and lower respiratory sp ecimens during the acute  phase of infection. The expected result is Negative. Fact Sheet for Patients:  StrictlyIdeas.no Fact Sheet for Healthcare Providers: BankingDealers.co.za This test is not yet approved or cleared by the [place] FDA and has been authorized for detection and/or diagnosis of SARS-CoV-2 by FDA under an Emergency Use Authorization (EUA).  This EUA will remain in effect (meaning this test can be used) for the duration of the COVID-19 declaration under  Section 564(b)(1) of the Act, 21 U.S.C. section 360bbb-3(b)(1), unless the authorization is terminated or revoked sooner. Performed at [facility], [place] [address], [place], [place] [address]     Radiology Reports Ct Abdomen Pelvis Wo Contrast  Result Date: 02/05/2019 CLINICAL DATA:  Bowel obstruction EXAM: CT ABDOMEN AND PELVIS WITHOUT CONTRAST TECHNIQUE: Multidetector CT imaging of the abdomen and pelvis was performed following the standard protocol without IV contrast. COMPARISON:  Radiograph 02/04/2019, CT 02/01/2019, 12/26/2018, 12/10/2018 FINDINGS: Lower chest: Lung bases demonstrate no acute consolidation or effusion. Stable nodular foci at the right middle lobe. Heart size upper normal. Esophageal tube within the distal esophagus, the tip terminates at the distal stomach. Hepatobiliary: No focal hepatic abnormality. Small amount of pneumobilia. Distal common duct  stent. Pancreas: Atrophic.  No ductal dilatation Spleen: Normal in size without focal abnormality. Adrenals/Urinary Tract: Adrenal glands are unremarkable. Kidneys are normal, without renal calculi, focal lesion, or hydronephrosis. Bladder is slightly thick walled. Stomach/Bowel: The stomach is nonenlarged. There is no dilated small bowel. Diffusely enlarged colon, contains contrast. Cecal dilatation up 10.3 cm. This appears increased. Transverse colon dilated up to 7.1 cm. Irregular decompressed appearance of the rectosigmoid colon. Small amount of contrast within the rectum. Reflux contrast into slightly dilated distal small bowel. Vascular/Lymphatic: Mild aortic atherosclerosis without aneurysm. Reproductive: Prostate is unremarkable. Other: No free air. Moderate volume of ascites within the abdomen with small amount of ascites in the pelvis. Diffuse nodular infiltration of the omentum suspicious for carcinomatosis. Fat within the inguinal canals. Musculoskeletal: No acute or suspicious osseous abnormality. IMPRESSION: 1. Marked dilatation of the colon with cecal dilatation up to 10.3 cm, appears increased as compared with prior CT. Irregular collapsed segment of rectosigmoid colon, raises possibility of stricture. Contrast was administered rectally and did manage to pass this segment of bowel into the upstream colon. 2. Moderate ascites within the abdomen with nodular infiltration of the omentum and mesentery, concerning for carcinomatosis. 3. Small amount of pneumobilia, presumably related to distal common duct stent. Electronically Signed   By: Donavan Foil M.D.   On: 02/05/2019 04:05   Ct Abdomen Pelvis Wo Contrast  Result Date: 02/01/2019 CLINICAL DATA:  Abdominal distension, constipation, nausea, symptoms worse for 1 week, recent paracentesis, history pancreatic cancer, smoker EXAM: CT ABDOMEN AND PELVIS WITHOUT CONTRAST TECHNIQUE: Multidetector CT imaging of the abdomen and pelvis was performed  following the standard protocol without IV contrast. Sagittal and coronal MPR images reconstructed from axial data set. Patient drank dilute oral contrast for exam COMPARISON:  12/26/2018 FINDINGS: Lower chest: Ill-defined nodular density LEFT lower lobe 4 mm diameter image 15 unchanged. Additional tiny subpleural density LEFT lower lobe laterally adjacent to major fissure image 14 unchanged. Hepatobiliary: Pneumobilia consistent likely due to noted distal CBD wall stent. Gallbladder surgically absent. No focal hepatic mass lesions. Pancreas: Atrophic pancreas. Fullness of pancreatic head versus body though no discrete mass or pancreatic ductal dilatation is seen. Spleen: Normal appearance Adrenals/Urinary Tract: Adrenal glands normal appearance. Kidneys, ureters, and bladder normal appearance Stomach/Bowel: Distended stomach. Diffuse colonic distension attending stool, with contrast in the RIGHT colon. Cecum 8.7 cm transverse. Rectosigmoid colon nondistended. Nonobstructed small bowel loops, oral contrast in RIGHT colon. Appendix not visualized. No definite bowel wall thickening. Vascular/Lymphatic: Atherosclerotic calcifications Reproductive: Mild prostatic enlargement. Seminal vesicles unremarkable. Other: Scattered ascites. Diffuse omental stranding increased from previous exam. Areas of thickening and slight nodularity at peritoneal surfaces. Findings are consistent with peritoneal carcinomatosis. BILATERAL inguinal hernias containing fat. No free air.  Tiny umbilical hernia containing fat. Interloop fluid. Musculoskeletal: No acute osseous lesions. IMPRESSION: Increased ascites. Increased omental infiltration with scattered areas of thickening and slight nodularity a peritoneal surfaces in the pelvis, consistent with peritoneal carcinomatosis. Mild colonic distention proximally without discrete obstruction though the sigmoid colon and rectum appear decompressed and there appear to be adjacent pelvic tumor  deposits. Prior CBD stenting. Electronically Signed   By: Lavonia Dana M.D.   On: 02/01/2019 15:28   Ct Chest Wo Contrast  Result Date: 01/09/2019 CLINICAL DATA:  Pancreatic cancer diagnosed June 07, 2018. Chemotherapy ongoing. EXAM: CT CHEST WITHOUT CONTRAST TECHNIQUE: Multidetector CT imaging of the chest was performed following the standard protocol without IV contrast. COMPARISON:  CT 12/10/2018 FINDINGS: Cardiovascular: Ascending thoracic aorta is mildly aneurysmal at 42 mm. Coronary artery calcification and aortic atherosclerotic calcification. Mediastinum/Nodes: No axillary supraclavicular adenopathy. No mediastinal hilar adenopathy. No pericardial effusion. Esophagus normal Lungs/Pleura: Biapical pulmonary parenchymal thickening. Rounded 4 mm nodule in the RIGHT lower lobe (image 127/5) not identified on CT 11 27 19. Subpleural nodule in the more superior RIGHT lower lobe measuring 3 mm (85/5) is also not identified. Third small nodule in the RIGHT lower lobe measuring 3 mm on image 75 is also new. Less defined nodule in the LEFT lower lobe medially measuring 4 mm (image 118) is present on comparison exam. Upper Abdomen: Limited view of the upper abdomen demonstrates pneumobilia as expected. No inflammation or infection identified Musculoskeletal: No aggressive osseous lesion. IMPRESSION: 1. Three small (less than 5 mm) pulmonary nodules in the RIGHT lower lobe are not identified on comparison CT from 07/02/2018 and therefore concerning for metastatic nodules. 2. Mild aneurysmal dilatation of the ascending thoracic aorta. Recommend annual imaging followup by CTA or MRA. This recommendation follows 2010 ACCF/AHA/AATS/ACR/ASA/SCA/SCAI/SIR/STS/SVM Guidelines for the Diagnosis and Management of Patients with Thoracic Aortic Disease. Circulation. 2010; 121: Q008-Q761. Aortic aneurysm NOS (ICD10-I71.9) Electronically Signed   By: Suzy Bouchard M.D.   On: 01/09/2019 09:52   US Paracentesis  Result Date:  02/02/2019 INDICATION: Patient with history of metastatic pancreatic cancer, recurrent ascites. Request made for therapeutic paracentesis. EXAM: ULTRASOUND GUIDED THERAPEUTIC PARACENTESIS MEDICATIONS: None COMPLICATIONS: None immediate. PROCEDURE: Informed written consent was obtained from the patient after a discussion of the risks, benefits and alternatives to treatment. A timeout was performed prior to the initiation of the procedure. Initial ultrasound scanning demonstrates a small to moderate amount of ascites within the left lower abdominal quadrant. The left lower abdomen was prepped and draped in the usual sterile fashion. 1% lidocaine was used for local anesthesia. Following this, a 19 gauge, 7-cm, Yueh catheter was introduced. An ultrasound image was saved for documentation purposes. The paracentesis was performed. The catheter was removed and a dressing was applied. The patient tolerated the procedure well without immediate post procedural complication. FINDINGS: A total of approximately 1.9 liters of slightly hazy, yellow fluid was removed. IMPRESSION: Successful ultrasound-guided therapeutic paracentesis yielding 1.9 liters of peritoneal fluid. Read by: Rowe Robert, PA-C Electronically Signed   By: Sandi Mariscal M.D.   On: 02/02/2019 17:04   US Paracentesis  Result Date: 01/30/2019 INDICATION: Pancreatic cancer with ascites. Request for diagnostic and therapeutic paracentesis. EXAM: ULTRASOUND GUIDED PARACENTESIS MEDICATIONS: 1% lidocaine 10 mL COMPLICATIONS: None immediate. PROCEDURE: Informed written consent was obtained from the patient after a discussion of the risks, benefits and alternatives to treatment. A timeout was performed prior to the initiation of the procedure. Initial ultrasound scanning demonstrates a moderate amount of ascites within the  right lower abdominal quadrant. The right lower abdomen was prepped and draped in the usual sterile fashion. 1% lidocaine with epinephrine was used  for local anesthesia. Following this, a 19 gauge, 7-cm, Yueh catheter was introduced. An ultrasound image was saved for documentation purposes. The paracentesis was performed. The catheter was removed and a dressing was applied. The patient tolerated the procedure well without immediate post procedural complication. FINDINGS: A total of approximately 2.5 L of clear yellow fluid was removed. Samples were sent to the laboratory as requested by the clinical team. IMPRESSION: Successful ultrasound-guided paracentesis yielding 2.5 liters of peritoneal fluid. Read by: Gareth Eagle PA-C Electronically Signed   By: Jerilynn Mages.  Shick M.D.   On: 01/30/2019 16:46   Dg Abd Acute W/chest  Result Date: 02/04/2019 CLINICAL DATA:  Abdominal pain EXAM: DG ABDOMEN ACUTE W/ 1V CHEST COMPARISON:  CT 628 3662 FINDINGS: Metallic stent noted in the right abdomen, likely biliary. Marked gaseous distention of the transverse colon with air-fluid levels. Pneumobilia noted. Prior cholecystectomy. No free air organomegaly. Heart is normal size. Lungs clear. No effusions or acute bony abnormality. IMPRESSION: Prior cholecystectomy with biliary stent and pneumobilia. Marked gaseous distention of the colon, most notable in the transverse colon with air-fluid levels, similar to prior CT. Electronically Signed   By: Rolm Baptise M.D.   On: 02/04/2019 22:50   Dg Abd Portable 1 View  Result Date: 02/04/2019 CLINICAL DATA:  NG tube placement EXAM: PORTABLE ABDOMEN - 1 VIEW COMPARISON:  02/04/2019 FINDINGS: NG tube has been placed with the tip in the distal stomach. Gaseous distention of the colon, predominantly transverse colon again noted, unchanged. IMPRESSION: NG tube tip in the distal stomach. Electronically Signed   By: Rolm Baptise M.D.   On: 02/04/2019 23:49     CBC Recent Labs  Lab 02/01/19 1320 02/04/19 2212 02/05/19 0515  WBC 8.8 12.4* 9.0  HGB 12.2* 12.9* 11.3*  HCT 39.5 39.2 34.0*  PLT 330 467* 419*  MCV 89.4 84.3 84.2  MCH 27.6  27.7 28.0  MCHC 30.9 32.9 33.2  RDW 15.8* 15.9* 15.9*  LYMPHSABS 1.0 1.0  --   MONOABS 1.0 1.5*  --   EOSABS 0.1 0.0  --   BASOSABS 0.0 0.0  --     Chemistries  Recent Labs  Lab 02/01/19 1320 02/04/19 2212 02/05/19 0515  NA 135 130* 131*  K 4.4 4.4 4.2  CL 99 92* 95*  CO2 27 26 26   GLUCOSE 141* 127* 138*  BUN 7 7 8   CREATININE 0.67 0.81 0.73  CALCIUM 8.7* 8.8* 8.1*  AST 24 29 25   ALT 39 41 35  ALKPHOS 116 100 93  BILITOT 0.1* 0.8 0.5   ------------------------------------------------------------------------------------------------------------------ No results for input(s): CHOL, HDL, LDLCALC, TRIG, CHOLHDL, LDLDIRECT in the last 72 hours.  Lab Results  Component Value Date   HGBA1C 5.4 11/19/2018   ------------------------------------------------------------------------------------------------------------------ No results for input(s): TSH, T4TOTAL, T3FREE, THYROIDAB in the last 72 hours.  Invalid input(s): FREET3 ------------------------------------------------------------------------------------------------------------------ No results for input(s): VITAMINB12, FOLATE, FERRITIN, TIBC, IRON, RETICCTPCT in the last 72 hours.  Coagulation profile No results for input(s): INR, PROTIME in the last 168 hours.  No results for input(s): DDIMER in the last 72 hours.  Cardiac Enzymes No results for input(s): CKMB, TROPONINI, MYOGLOBIN in the last 168 hours.  Invalid input(s): CK ------------------------------------------------------------------------------------------------------------------    Component Value Date/Time   BNP 106.2 (H) 11/18/2018 2248     Roxan Hockey M.D on 02/05/2019 at 4:58 PM  Go  to [email or web address] - for contact info  [facility] - Office  [phone]

## 2019-02-06 ENCOUNTER — Inpatient Hospital Stay (HOSPITAL_COMMUNITY): Payer: Managed Care, Other (non HMO)

## 2019-02-06 ENCOUNTER — Inpatient Hospital Stay (HOSPITAL_COMMUNITY): Payer: Managed Care, Other (non HMO) | Admitting: Certified Registered"

## 2019-02-06 ENCOUNTER — Encounter (HOSPITAL_COMMUNITY): Payer: Self-pay | Admitting: Certified Registered Nurse Anesthetist

## 2019-02-06 ENCOUNTER — Encounter (HOSPITAL_COMMUNITY)
Admission: EM | Disposition: A | Discharge status: Home / Self Care | Payer: Self-pay | Source: Home / Self Care | Attending: Student

## 2019-02-06 HISTORY — PX: COLONIC STENT PLACEMENT: SHX5542

## 2019-02-06 HISTORY — PX: COLONOSCOPY WITH PROPOFOL: SHX5780

## 2019-02-06 SURGERY — COLONOSCOPY WITH PROPOFOL
Anesthesia: Monitor Anesthesia Care

## 2019-02-06 MED ORDER — LACTATED RINGERS IV SOLN
INTRAVENOUS | Status: DC
  Administered 2019-02-06: 1000 mL via INTRAVENOUS

## 2019-02-06 MED ORDER — ONDANSETRON HCL 4 MG PO TABS: 4.0000 mg | ORAL_TABLET | ORAL | Status: DC | PRN

## 2019-02-06 MED ORDER — LORAZEPAM 2 MG/ML IJ SOLN
1.0000 mg | INTRAMUSCULAR | Status: DC | PRN
  Administered 2019-02-07: 1 mg via INTRAVENOUS
  Filled 2019-02-06: qty 1

## 2019-02-06 MED ORDER — PROPOFOL 10 MG/ML IV BOLUS
INTRAVENOUS | Status: DC | PRN
  Administered 2019-02-06: 40 mg via INTRAVENOUS

## 2019-02-06 MED ORDER — ONDANSETRON HCL 4 MG/2ML IJ SOLN
4.0000 mg | Freq: Four times a day (QID) | INTRAMUSCULAR | Status: DC | PRN
  Administered 2019-02-08 – 2019-02-11 (×3): 4 mg via INTRAVENOUS
  Filled 2019-02-06 (×4): qty 2

## 2019-02-06 MED ORDER — LIDOCAINE HCL (CARDIAC) PF 100 MG/5ML IV SOSY
PREFILLED_SYRINGE | INTRAVENOUS | Status: DC | PRN
  Administered 2019-02-06: 20 mg via INTRAVENOUS

## 2019-02-06 MED ORDER — POLYETHYLENE GLYCOL 3350 17 G PO PACK
17.0000 g | PACK | ORAL | Status: DC | PRN
  Administered 2019-02-07: 17 g via ORAL
  Filled 2019-02-06: qty 1

## 2019-02-06 MED ORDER — SODIUM CHLORIDE 0.9 % IV SOLN: INTRAVENOUS | Status: DC

## 2019-02-06 MED ORDER — POLYETHYLENE GLYCOL 3350 17 G PO PACK
17.0000 g | PACK | Freq: Every day | ORAL | Status: DC
  Administered 2019-02-06 – 2019-02-08 (×3): 17 g via ORAL
  Filled 2019-02-06 (×3): qty 1

## 2019-02-06 MED ORDER — MORPHINE SULFATE (PF) 4 MG/ML IV SOLN
4.0000 mg | INTRAVENOUS | Status: DC | PRN
  Administered 2019-02-06 – 2019-02-11 (×7): 4 mg via INTRAVENOUS
  Filled 2019-02-06 (×8): qty 1

## 2019-02-06 MED ORDER — PROPOFOL 500 MG/50ML IV EMUL
INTRAVENOUS | Status: DC | PRN
  Administered 2019-02-06: 150 ug/kg/min via INTRAVENOUS

## 2019-02-06 SURGICAL SUPPLY — 21 items

## 2019-02-06 NOTE — Anesthesia Preprocedure Evaluation (Signed)
Anesthesia Evaluation  Patient identified by MRN, date of birth, ID band Patient awake    Reviewed: Allergy & Precautions, NPO status , Patient's Chart, lab work & pertinent test results  Airway Mallampati: II  TM Distance: >3 FB Neck ROM: Full    Dental  (+) Dental Advisory Given, Edentulous Upper   Pulmonary former smoker,    Pulmonary exam normal breath sounds clear to auscultation       Cardiovascular negative cardio ROS Normal cardiovascular exam Rhythm:Regular Rate:Normal     Neuro/Psych PSYCHIATRIC DISORDERS Anxiety negative neurological ROS     GI/Hepatic Neg liver ROS, GERD  Medicated,Pancreatic cancer  Colonic obstruction   Endo/Other  negative endocrine ROS  Renal/GU negative Renal ROS     Musculoskeletal negative musculoskeletal ROS (+)   Abdominal   Peds  Hematology  (+) Blood dyscrasia (Xarelto), anemia ,   Anesthesia Other Findings Day of surgery medications reviewed with the patient.  Reproductive/Obstetrics                             Anesthesia Physical Anesthesia Plan  ASA: III  Anesthesia Plan: MAC   Post-op Pain Management:    Induction: Intravenous  PONV Risk Score and Plan: 1 and Propofol infusion and Treatment may vary due to age or medical condition  Airway Management Planned: Nasal Cannula and Natural Airway  Additional Equipment:   Intra-op Plan:   Post-operative Plan:   Informed Consent: I have reviewed the patients History and Physical, chart, labs and discussed the procedure including the risks, benefits and alternatives for the proposed anesthesia with the patient or authorized representative who has indicated his/her understanding and acceptance.   Patient has DNR.  Discussed DNR with patient.   Dental advisory given  Plan Discussed with: CRNA and Anesthesiologist  Anesthesia Plan Comments:         Anesthesia Quick  Evaluation

## 2019-02-06 NOTE — Progress Notes (Signed)
Palliative Medicine RN Note: Consult order noted.   Patient had anesthesia this morning, so our team will not engage today.  Of note, Dr Burr Medico saw Ernest Wu yesterday & set him up with home hospice and DNR. Her note indicates he would like to pursue current offered therapies as a possible palliative measure. Our team will continue to monitor chart and will engage when and if appropriate.  Marjie Skiff Landri Dorsainvil, RN, BSN, [facility] Palliative Medicine Team 02/06/2019 11:18 AM Office [phone]

## 2019-02-06 NOTE — Op Note (Signed)
[facility] ([facility] Patient Name: Ernest Wu Procedure Date : 02/06/2019 MRN: [id number] Attending MD: Clarene Essex , MD Date of Birth: [birth year] CSN: [id number] Age: 58 Admit Type: Inpatient Procedure:                Colonoscopy Indications:              Therapeutic procedure, Abnormal CT of the GI tract,                            For therapy of colonic obstruction Providers:                Clarene Essex, MD, Jeanella Cara, RN,                            William Dalton, Technician Referring MD:              Medicines:                Propofol total dose 211 mg IV Complications:            No immediate complications. Estimated Blood Loss:     Estimated blood loss: none. Procedure:                Pre-Anesthesia Assessment:                           - Prior to the procedure, a History and Physical                            was performed, and patient medications and                            allergies were reviewed. The patient's tolerance of                            previous anesthesia was also reviewed. The risks                            and benefits of the procedure and the sedation                            options and risks were discussed with the patient.                            All questions were answered, and informed consent                            was obtained. Prior Anticoagulants: The patient has                            taken Xarelto (rivaroxaban), last dose was 3 days                            prior to procedure. ASA Grade Assessment: II - A  patient with mild systemic disease. After reviewing                            the risks and benefits, the patient was deemed in                            satisfactory condition to undergo the procedure.                           After obtaining informed consent, the colonoscope                            was passed under direct vision. Throughout the   procedure, the patient's blood pressure, pulse, and                            oxygen saturations were monitored continuously. The                            [id number]-[id number] ([id number] Olympus therapeutic                            gastroscope was introduced through the anus and                            advanced to the the descending colon for                            evaluation. This was the intended extent. The                            [id number]-[id number] ([id number]) Olympus ultra slim colonoscope                            was introduced through the and advanced to the. The                            colonoscopy was technically difficult and complex                            due to abnormal anatomy and poor bowel prep. The                            patient tolerated the procedure well. The quality                            of the bowel preparation was poor. Findings:      A moderate amount of stool was found in the recto-sigmoid colon, in the       sigmoid colon and in the descending colon, making visualization       difficult. Lavage of the area was performed using a moderate amount of       sterile water, resulting in incomplete clearance with continued poor       visualization.  An extrinsic moderate stenosis measuring 7 cm (in length) was found in       the distal sigmoid colon and was traversed. This was stented with a 22       mm x 12 cm WallFlex stent under fluoroscopic guidance.      The exam was otherwise without abnormality. Impression:               - Preparation of the colon was poor.                           - Stool in the recto-sigmoid colon, in the sigmoid                            colon and in the descending colon.                           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 Prosthesis                            placed.                           - The examination was otherwise normal.                           - No specimens collected. Moderate Sedation:       moderate sedation-none Recommendation:           - NPO Until passing gas and obstruction or ileus                            resolved.                           - Continue present medications. Will use periodic                            MiraLAX and try to avoid pain medicines                           - Return to GI clinic PRN.                           - Telephone GI clinic if symptomatic PRN. If not                            helpful over next few days might need surgical                            consult for diverting colostomy Procedure Code(s):        --- Professional ---                           [phone], Colonoscopy, flexible; with endoscopic stent  placement (includes pre- and post-dilation and                            guide wire passage, when performed) Diagnosis Code(s):        --- Professional ---                           K18.403, Other intestinal obstruction unspecified                            as to partial versus complete obstruction                           K56.609, Unspecified intestinal obstruction,                            unspecified as to partial versus complete                            obstruction                           R93.3, Abnormal findings on diagnostic imaging of                            other parts of digestive tract CPT copyright 2019 [employer]. All rights reserved. The codes documented in this report are preliminary and upon coder review may  be revised to meet current compliance requirements. Clarene Essex, MD 02/06/2019 9:07:19 AM This report has been signed electronically. Number of Addenda: 0

## 2019-02-06 NOTE — Transfer of Care (Signed)
Immediate Anesthesia Transfer of Care Note  Patient: Ernest Wu  Procedure(s) Performed: COLONOSCOPY WITH PROPOFOL for  colonic stent placement (N/A ) COLONIC STENT PLACEMENT (N/A )  Patient Location: Endoscopy Unit  Anesthesia Type:MAC  Level of Consciousness: drowsy  Airway & Oxygen Therapy: Patient Spontanous Breathing and Patient connected to nasal cannula oxygen  Post-op Assessment: Report given to RN and Post -op Vital signs reviewed and stable  Post vital signs: Reviewed and stable  Last Vitals:  Vitals Value Taken Time  BP 160/106 02/06/19 0911  Temp    Pulse 72 02/06/19 0913  Resp 18 02/06/19 0913  SpO2 100 % 02/06/19 0913  Vitals shown include unvalidated device data.  Last Pain:  Vitals:   02/06/19 0735  TempSrc: Other (Comment)  PainSc:          Complications: No apparent anesthesia complications

## 2019-02-06 NOTE — Progress Notes (Signed)
[name] 8:03 AM  Subjective: The patient did have some results from his enemas and feels okay overall just bloated and his hospital computer chart and CT was reviewed and his case discussed with Dr. Therisa Doyne and Dr. Alessandra Bevels he has no new complaints Objective: Vital signs stable afebrile no acute distress exam please see preassessment evaluation obvious distention rare bowel sounds moderate tympany no rebound labs stable CT reviewed as above  Assessment: Distal colon obstruction  Plan: Okay to attempt stent with anesthesia assistance the risks of the procedure was discussed as well as options in case we are unsuccessful  [facility]  office [phone] After 5PM or if no answer call [phone]

## 2019-02-06 NOTE — Anesthesia Postprocedure Evaluation (Signed)
Anesthesia Post Note  Patient: Ernest Wu  Procedure(s) Performed: COLONOSCOPY WITH PROPOFOL for  colonic stent placement (N/A ) COLONIC STENT PLACEMENT (N/A )     Patient location during evaluation: Endoscopy Anesthesia Type: MAC Level of consciousness: awake and alert Pain management: pain level controlled Vital Signs Assessment: post-procedure vital signs reviewed and stable Respiratory status: spontaneous breathing, nonlabored ventilation, respiratory function stable and patient connected to nasal cannula oxygen Cardiovascular status: stable and blood pressure returned to baseline Postop Assessment: no apparent nausea or vomiting Anesthetic complications: no    Last Vitals:  Vitals:   02/06/19 0925 02/06/19 0959  BP:  (!) 143/95  Pulse: 73 72  Resp: 18 16  Temp:  36.5 C  SpO2: 100% 99%    Last Pain:  Vitals:   02/06/19 1115  TempSrc:   PainSc: [place]

## 2019-02-06 NOTE — Progress Notes (Signed)
Patient Demographics:    Ernest Wu, is a 58 y.o. male, DOB - Mar 20, 1961, GDJ:[id number]  Admit date - 02/04/2019   Admitting Physician Elwyn Reach, MD  Outpatient Primary MD for the patient is Street, [name], MD  LOS - 2   Chief Complaint  Patient presents with   Abdominal Pain        Subjective:    Ernest Wu today has no fevers, no emesis,  No chest pain, sleepy after colonoscopy with colonic stent placement  Assessment  & Plan :    Principal Problem:   Intestinal obstruction ([place]) Active Problems:   Jaundice   Tobacco use   Adenocarcinoma of pancreas (HCC)   Nausea & vomiting  Brief Summary- 58 Yo with pmhx of GERD, tobacco abuse, previous biliary stent , prior cholecystectomy ,  Admitted on 02/04/2019 with abdominal pain nausea vomiting and found to have mild dilatation of the colon with cecal dilatation up to 10.3 cm, patient and wife are decided to go with palliative/hospice care post discharge    A/p 1)Colonic obstruction  from 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 Burr Medico from oncology service oncology input appreciated .  No plans for further chemo . -- s/p Colonic Stent/Prosthesis placement by Dr. Watt Climes during colonoscopy on 02/06/2019 stricture in the distal sigmoid colon. Prosthesis , if patient fails to improve will need diverting colostomy  2)Recurrent Malignant Ascites in the setting of metastatic pancreatic cancer--patient was initially scheduled for peritoneal catheter placement on 02/09/2019 with interventional radiology as outpatient prior to this admission  3)Chronic Anticoagulation--- Xarelto on hold to allow for procedures, continue prophylactic Lovenox  4)Social/Ethics--- patient is now a  DNR,, as per oncology notes-- Dr Burr Medico, patient and his wife have agreed to enroll in hospice with [facility] hospice services post discharge for the hospital--- no further chemo planned, he may not need additional palliative care service  Disposition/Need for in-Hospital Stay- patient unable to be discharged at this time due to bowel obstruction requiring IV fluids NG tube and colonoscopic/surgical intervention--may need diverting colostomy if colonic stent/prosthesis fails  Code Status : DNR Family Communication:   (patient is alert, awake and coherent) Dr Burr Medico talked to pt's wife  Disposition Plan  : Possibly home with hospice over the next couple of days if able to tolerate oral intake  Consults  :  Gi/gen Surgery/Oncology  DVT Prophylaxis  :  Lovenox -  SCDs ///Xarelto on hold  Lab Results  Component Value Date   PLT 419 (H) 02/05/2019    Inpatient Medications  Scheduled Meds:  diatrizoate meglumine-sodium  30 mL Oral Once   enoxaparin (LOVENOX) injection  40 mg Subcutaneous Daily   nicotine  21 mg Transdermal Daily   polyethylene glycol  17 g Oral Daily   sodium chloride flush  10-40 mL Intracatheter Q12H   Continuous Infusions:  dextrose 5 % and 0.9% NaCl 100 mL/hr at 02/06/19 1117   PRN Meds:.LORazepam, morphine injection, ondansetron **OR** ondansetron (ZOFRAN) IV, sodium chloride flush    Anti-infectives (From admission, onward)   None       Objective:  Vitals:   02/06/19 0920 02/06/19 0925 02/06/19 0959 02/06/19 1616  BP: (!) 162/98  (!) 143/95 (!) 134/95  Pulse: 71 73 72 96  Resp: 14 18 16 16   Temp:   97.7 F (36.5 C) 98.1 F (36.7 C)  TempSrc:   Oral Oral  SpO2: 100% 100% 99% 98%  Weight:      Height:        Wt Readings from Last 3 Encounters:  02/06/19 84.5 kg  02/02/19 84.6 kg  01/30/19 86.5 kg     Intake/Output Summary (Last 24 hours) at 02/06/2019 1828 Last data filed at 02/06/2019 0857 Gross per 24 hour  Intake 1775 ml  Output  --  Net 1775 ml     Physical Exam  Gen:- Awake Alert,  In no apparent distress  HEENT:- [place].AT, No sclera icterus Nose- NG Tube Neck-Supple Neck,No JVD,.  Lungs-  CTAB , fair symmetrical air movement CV- S1, S2 normal, regular  Abd-abdomen slightly distended, generalized tenderness, palpable masses Extremity/Skin:- No  edema, pedal pulses present  Psych-affect is appropriate, oriented x3 Neuro-no new focal deficits, no tremors   Data Review:   Micro Results Recent Results (from the past 240 hour(s))  SARS Coronavirus 2 (CEPHEID - Performed in [facility] hospital lab), Hosp Order     Status: None   Collection Time: 02/04/19 11:32 PM   Specimen: Nasopharyngeal Swab  Result Value Ref Range Status   SARS Coronavirus 2 NEGATIVE NEGATIVE Final    Comment: (NOTE) If result is NEGATIVE SARS-CoV-2 target nucleic acids are NOT DETECTED. The SARS-CoV-2 RNA is generally detectable in upper and lower  respiratory specimens during the acute phase of infection. The lowest  concentration of SARS-CoV-2 viral copies this assay can detect is 250  copies / mL. A negative result does not preclude SARS-CoV-2 infection  and should not be used as the sole basis for treatment or other  patient management decisions.  A negative result may occur with  improper specimen collection / handling, submission of specimen other  than nasopharyngeal swab, presence of viral mutation(s) within the  areas targeted by this assay, and inadequate number of viral copies  (<250 copies / mL). A negative result must be combined with clinical  observations, patient history, and epidemiological information. If result is POSITIVE SARS-CoV-2 target nucleic acids are DETECTED. The SARS-CoV-2 RNA is generally detectable in upper and lower  respiratory specimens dur ing the acute phase of infection.  Positive  results are indicative of active infection with SARS-CoV-2.  Clinical  correlation with patient history and other  diagnostic information is  necessary to determine patient infection status.  Positive results do  not rule out bacterial infection or co-infection with other viruses. If result is PRESUMPTIVE POSTIVE SARS-CoV-2 nucleic acids MAY BE PRESENT.   A presumptive positive result was obtained on the submitted specimen  and confirmed on repeat testing.  While 2019 novel coronavirus  (SARS-CoV-2) nucleic acids may be present in the submitted sample  additional confirmatory testing may be necessary for epidemiological  and / or clinical management purposes  to differentiate between  SARS-CoV-2 and other Sarbecovirus currently known to infect humans.  If clinically indicated additional testing with an alternate test  methodology [phone]) is advised. The SARS-CoV-2 RNA is generally  detectable in upper and lower respiratory sp ecimens during the acute  phase of infection. The expected result is Negative. Fact Sheet for Patients:  StrictlyIdeas.no Fact Sheet for Healthcare Providers: BankingDealers.co.za This test is not yet approved  or cleared by the [place] and has been authorized for detection and/or diagnosis of SARS-CoV-2 by FDA under an Emergency Use Authorization (EUA).  This EUA will remain in effect (meaning this test can be used) for the duration of the COVID-19 declaration under Section 564(b)(1) of the Act, 21 U.S.C. section 360bbb-3(b)(1), unless the authorization is terminated or revoked sooner. Performed at [facility], [place] [address], [place], [place] [address]     Radiology Reports Ct Abdomen Pelvis Wo Contrast  Result Date: 02/05/2019 CLINICAL DATA:  Bowel obstruction EXAM: CT ABDOMEN AND PELVIS WITHOUT CONTRAST TECHNIQUE: Multidetector CT imaging of the abdomen and pelvis was performed following the standard protocol without IV contrast. COMPARISON:  Radiograph 02/04/2019, CT 02/01/2019, 12/26/2018, 12/10/2018  FINDINGS: Lower chest: Lung bases demonstrate no acute consolidation or effusion. Stable nodular foci at the right middle lobe. Heart size upper normal. Esophageal tube within the distal esophagus, the tip terminates at the distal stomach. Hepatobiliary: No focal hepatic abnormality. Small amount of pneumobilia. Distal common duct stent. Pancreas: Atrophic.  No ductal dilatation Spleen: Normal in size without focal abnormality. Adrenals/Urinary Tract: Adrenal glands are unremarkable. Kidneys are normal, without renal calculi, focal lesion, or hydronephrosis. Bladder is slightly thick walled. Stomach/Bowel: The stomach is nonenlarged. There is no dilated small bowel. Diffusely enlarged colon, contains contrast. Cecal dilatation up 10.3 cm. This appears increased. Transverse colon dilated up to 7.1 cm. Irregular decompressed appearance of the rectosigmoid colon. Small amount of contrast within the rectum. Reflux contrast into slightly dilated distal small bowel. Vascular/Lymphatic: Mild aortic atherosclerosis without aneurysm. Reproductive: Prostate is unremarkable. Other: No free air. Moderate volume of ascites within the abdomen with small amount of ascites in the pelvis. Diffuse nodular infiltration of the omentum suspicious for carcinomatosis. Fat within the inguinal canals. Musculoskeletal: No acute or suspicious osseous abnormality. IMPRESSION: 1. Marked dilatation of the colon with cecal dilatation up to 10.3 cm, appears increased as compared with prior CT. Irregular collapsed segment of rectosigmoid colon, raises possibility of stricture. Contrast was administered rectally and did manage to pass this segment of bowel into the upstream colon. 2. Moderate ascites within the abdomen with nodular infiltration of the omentum and mesentery, concerning for carcinomatosis. 3. Small amount of pneumobilia, presumably related to distal common duct stent. Electronically Signed   By: Donavan Foil M.D.   On: 02/05/2019  04:05   Ct Abdomen Pelvis Wo Contrast  Result Date: 02/01/2019 CLINICAL DATA:  Abdominal distension, constipation, nausea, symptoms worse for 1 week, recent paracentesis, history pancreatic cancer, smoker EXAM: CT ABDOMEN AND PELVIS WITHOUT CONTRAST TECHNIQUE: Multidetector CT imaging of the abdomen and pelvis was performed following the standard protocol without IV contrast. Sagittal and coronal MPR images reconstructed from axial data set. Patient drank dilute oral contrast for exam COMPARISON:  12/26/2018 FINDINGS: Lower chest: Ill-defined nodular density LEFT lower lobe 4 mm diameter image 15 unchanged. Additional tiny subpleural density LEFT lower lobe laterally adjacent to major fissure image 14 unchanged. Hepatobiliary: Pneumobilia consistent likely due to noted distal CBD wall stent. Gallbladder surgically absent. No focal hepatic mass lesions. Pancreas: Atrophic pancreas. Fullness of pancreatic head versus body though no discrete mass or pancreatic ductal dilatation is seen. Spleen: Normal appearance Adrenals/Urinary Tract: Adrenal glands normal appearance. Kidneys, ureters, and bladder normal appearance Stomach/Bowel: Distended stomach. Diffuse colonic distension attending stool, with contrast in the RIGHT colon. Cecum 8.7 cm transverse. Rectosigmoid colon nondistended. Nonobstructed small bowel loops, oral contrast in RIGHT colon. Appendix not visualized. No definite  bowel wall thickening. Vascular/Lymphatic: Atherosclerotic calcifications Reproductive: Mild prostatic enlargement. Seminal vesicles unremarkable. Other: Scattered ascites. Diffuse omental stranding increased from previous exam. Areas of thickening and slight nodularity at peritoneal surfaces. Findings are consistent with peritoneal carcinomatosis. BILATERAL inguinal hernias containing fat. No free air. Tiny umbilical hernia containing fat. Interloop fluid. Musculoskeletal: No acute osseous lesions. IMPRESSION: Increased ascites.  Increased omental infiltration with scattered areas of thickening and slight nodularity a peritoneal surfaces in the pelvis, consistent with peritoneal carcinomatosis. Mild colonic distention proximally without discrete obstruction though the sigmoid colon and rectum appear decompressed and there appear to be adjacent pelvic tumor deposits. Prior CBD stenting. Electronically Signed   By: Lavonia Dana M.D.   On: 02/01/2019 15:28   Dg Abd 1 View  Result Date: 02/06/2019 CLINICAL DATA:  Sigmoid stent placement. EXAM: ABDOMEN - 1 VIEW; DG C-ARM 61-120 MIN COMPARISON:  CT abdomen pelvis from yesterday. FLUOROSCOPY TIME:  1 minutes, 26 seconds. C-arm fluoroscopic images were obtained intraoperatively and submitted for post operative interpretation. FINDINGS: Intraprocedural fluoroscopic images demonstrate interval placement of a sigmoid colonic stent. Prominent narrowing of the mid stent. IMPRESSION: 1. Intraprocedural fluoroscopic guidance for sigmoid colon stent placement. Electronically Signed   By: Titus Dubin M.D.   On: 02/06/2019 12:11   Ct Chest Wo Contrast  Result Date: 01/09/2019 CLINICAL DATA:  Pancreatic cancer diagnosed June 07, 2018. Chemotherapy ongoing. EXAM: CT CHEST WITHOUT CONTRAST TECHNIQUE: Multidetector CT imaging of the chest was performed following the standard protocol without IV contrast. COMPARISON:  CT 12/10/2018 FINDINGS: Cardiovascular: Ascending thoracic aorta is mildly aneurysmal at 42 mm. Coronary artery calcification and aortic atherosclerotic calcification. Mediastinum/Nodes: No axillary supraclavicular adenopathy. No mediastinal hilar adenopathy. No pericardial effusion. Esophagus normal Lungs/Pleura: Biapical pulmonary parenchymal thickening. Rounded 4 mm nodule in the RIGHT lower lobe (image 127/5) not identified on CT 11 27 19. Subpleural nodule in the more superior RIGHT lower lobe measuring 3 mm (85/5) is also not identified. Third small nodule in the RIGHT lower lobe  measuring 3 mm on image 75 is also new. Less defined nodule in the LEFT lower lobe medially measuring 4 mm (image 118) is present on comparison exam. Upper Abdomen: Limited view of the upper abdomen demonstrates pneumobilia as expected. No inflammation or infection identified Musculoskeletal: No aggressive osseous lesion. IMPRESSION: 1. Three small (less than 5 mm) pulmonary nodules in the RIGHT lower lobe are not identified on comparison CT from 07/02/2018 and therefore concerning for metastatic nodules. 2. Mild aneurysmal dilatation of the ascending thoracic aorta. Recommend annual imaging followup by CTA or MRA. This recommendation follows 2010 ACCF/AHA/AATS/ACR/ASA/SCA/SCAI/SIR/STS/SVM Guidelines for the Diagnosis and Management of Patients with Thoracic Aortic Disease. Circulation. 2010; 121: W102-V253. Aortic aneurysm NOS (ICD10-I71.9) Electronically Signed   By: Suzy Bouchard M.D.   On: 01/09/2019 09:52   US Paracentesis  Result Date: 02/02/2019 INDICATION: Patient with history of metastatic pancreatic cancer, recurrent ascites. Request made for therapeutic paracentesis. EXAM: ULTRASOUND GUIDED THERAPEUTIC PARACENTESIS MEDICATIONS: None COMPLICATIONS: None immediate. PROCEDURE: Informed written consent was obtained from the patient after a discussion of the risks, benefits and alternatives to treatment. A timeout was performed prior to the initiation of the procedure. Initial ultrasound scanning demonstrates a small to moderate amount of ascites within the left lower abdominal quadrant. The left lower abdomen was prepped and draped in the usual sterile fashion. 1% lidocaine was used for local anesthesia. Following this, a 19 gauge, 7-cm, Yueh catheter was introduced. An ultrasound image was saved for documentation purposes. The paracentesis  was performed. The catheter was removed and a dressing was applied. The patient tolerated the procedure well without immediate post procedural complication.  FINDINGS: A total of approximately 1.9 liters of slightly hazy, yellow fluid was removed. IMPRESSION: Successful ultrasound-guided therapeutic paracentesis yielding 1.9 liters of peritoneal fluid. Read by: Rowe Robert, PA-C Electronically Signed   By: Sandi Mariscal M.D.   On: 02/02/2019 17:04   US Paracentesis  Result Date: 01/30/2019 INDICATION: Pancreatic cancer with ascites. Request for diagnostic and therapeutic paracentesis. EXAM: ULTRASOUND GUIDED PARACENTESIS MEDICATIONS: 1% lidocaine 10 mL COMPLICATIONS: None immediate. PROCEDURE: Informed written consent was obtained from the patient after a discussion of the risks, benefits and alternatives to treatment. A timeout was performed prior to the initiation of the procedure. Initial ultrasound scanning demonstrates a moderate amount of ascites within the right lower abdominal quadrant. The right lower abdomen was prepped and draped in the usual sterile fashion. 1% lidocaine with epinephrine was used for local anesthesia. Following this, a 19 gauge, 7-cm, Yueh catheter was introduced. An ultrasound image was saved for documentation purposes. The paracentesis was performed. The catheter was removed and a dressing was applied. The patient tolerated the procedure well without immediate post procedural complication. FINDINGS: A total of approximately 2.5 L of clear yellow fluid was removed. Samples were sent to the laboratory as requested by the clinical team. IMPRESSION: Successful ultrasound-guided paracentesis yielding 2.5 liters of peritoneal fluid. Read by: Gareth Eagle PA-C Electronically Signed   By: Jerilynn Mages.  Shick M.D.   On: 01/30/2019 16:46   Dg Abd Acute W/chest  Result Date: 02/04/2019 CLINICAL DATA:  Abdominal pain EXAM: DG ABDOMEN ACUTE W/ 1V CHEST COMPARISON:  CT [id number] FINDINGS: Metallic stent noted in the right abdomen, likely biliary. Marked gaseous distention of the transverse colon with air-fluid levels. Pneumobilia noted. Prior cholecystectomy.  No free air organomegaly. Heart is normal size. Lungs clear. No effusions or acute bony abnormality. IMPRESSION: Prior cholecystectomy with biliary stent and pneumobilia. Marked gaseous distention of the colon, most notable in the transverse colon with air-fluid levels, similar to prior CT. Electronically Signed   By: Rolm Baptise M.D.   On: 02/04/2019 22:50   Dg Abd Portable 1 View  Result Date: 02/04/2019 CLINICAL DATA:  NG tube placement EXAM: PORTABLE ABDOMEN - 1 VIEW COMPARISON:  02/04/2019 FINDINGS: NG tube has been placed with the tip in the distal stomach. Gaseous distention of the colon, predominantly transverse colon again noted, unchanged. IMPRESSION: NG tube tip in the distal stomach. Electronically Signed   By: Rolm Baptise M.D.   On: 02/04/2019 23:49   Dg C-arm 1-60 Min  Result Date: 02/06/2019 CLINICAL DATA:  Sigmoid stent placement. EXAM: ABDOMEN - 1 VIEW; DG C-ARM 61-120 MIN COMPARISON:  CT abdomen pelvis from yesterday. FLUOROSCOPY TIME:  1 minutes, 26 seconds. C-arm fluoroscopic images were obtained intraoperatively and submitted for post operative interpretation. FINDINGS: Intraprocedural fluoroscopic images demonstrate interval placement of a sigmoid colonic stent. Prominent narrowing of the mid stent. IMPRESSION: 1. Intraprocedural fluoroscopic guidance for sigmoid colon stent placement. Electronically Signed   By: Titus Dubin M.D.   On: 02/06/2019 12:11     CBC Recent Labs  Lab 02/01/19 1320 02/04/19 2212 02/05/19 0515  WBC 8.8 12.4* 9.0  HGB 12.2* 12.9* 11.3*  HCT 39.5 39.2 34.0*  PLT 330 467* 419*  MCV 89.4 84.3 84.2  MCH 27.6 27.7 28.0  MCHC 30.9 32.9 33.2  RDW 15.8* 15.9* 15.9*  LYMPHSABS 1.0 1.0  --   MONOABS  1.0 1.5*  --   EOSABS 0.1 0.0  --   BASOSABS 0.0 0.0  --     Chemistries  Recent Labs  Lab 02/01/19 1320 02/04/19 2212 02/05/19 0515  NA 135 130* 131*  K 4.4 4.4 4.2  CL 99 92* 95*  CO2 27 26 26   GLUCOSE 141* 127* 138*  BUN 7 7 8     CREATININE 0.67 0.81 0.73  CALCIUM 8.7* 8.8* 8.1*  AST 24 29 25   ALT 39 41 35  ALKPHOS 116 100 93  BILITOT 0.1* 0.8 0.5   ------------------------------------------------------------------------------------------------------------------ No results for input(s): CHOL, HDL, LDLCALC, TRIG, CHOLHDL, LDLDIRECT in the last 72 hours.  Lab Results  Component Value Date   HGBA1C 5.4 11/19/2018   ------------------------------------------------------------------------------------------------------------------ No results for input(s): TSH, T4TOTAL, T3FREE, THYROIDAB in the last 72 hours.  Invalid input(s): FREET3 ------------------------------------------------------------------------------------------------------------------ No results for input(s): VITAMINB12, FOLATE, FERRITIN, TIBC, IRON, RETICCTPCT in the last 72 hours.  Coagulation profile No results for input(s): INR, PROTIME in the last 168 hours.  No results for input(s): DDIMER in the last 72 hours.  Cardiac Enzymes No results for input(s): CKMB, TROPONINI, MYOGLOBIN in the last 168 hours.  Invalid input(s): CK ------------------------------------------------------------------------------------------------------------------    Component Value Date/Time   BNP 106.2 (H) 11/18/2018 2248     Roxan Hockey M.D on 02/06/2019 at 6:28 PM  Go to [email or web address] - for contact info  [facility] - Office  [phone]

## 2019-02-06 NOTE — Plan of Care (Signed)

## 2019-02-07 ENCOUNTER — Encounter (HOSPITAL_COMMUNITY): Payer: Self-pay | Admitting: Gastroenterology

## 2019-02-07 ENCOUNTER — Inpatient Hospital Stay (HOSPITAL_COMMUNITY): Payer: Managed Care, Other (non HMO)

## 2019-02-07 DIAGNOSIS — R17 Unspecified jaundice: Secondary | ICD-10-CM

## 2019-02-07 LAB — BASIC METABOLIC PANEL
Anion gap: 10 (ref 5–15)
BUN: 8 mg/dL (ref 6–20)
CO2: 25 mmol/L (ref 22–32)
Calcium: 8.6 mg/dL — ABNORMAL LOW (ref 8.9–10.3)
Chloride: 99 mmol/L (ref 98–111)
Creatinine, Ser: 0.86 mg/dL (ref 0.61–1.24)
GFR calc Af Amer: >60 mL/min (ref >60)
GFR calc non Af Amer: >60 mL/min (ref >60)
Glucose, Bld: 122 mg/dL — ABNORMAL HIGH (ref 70–99)
Potassium: 4.1 mmol/L (ref 3.5–5.1)
Sodium: 134 mmol/L — ABNORMAL LOW (ref 135–145)

## 2019-02-07 LAB — CBC
HCT: 38.6 % — ABNORMAL LOW (ref 39.0–52.0)
Hemoglobin: 12.4 g/dL — ABNORMAL LOW (ref 13.0–17.0)
MCH: 27.7 pg (ref 26.0–34.0)
MCHC: 32.1 g/dL (ref 30.0–36.0)
MCV: 86.4 fL (ref 80.0–100.0)
Platelets: 499 10*3/uL — ABNORMAL HIGH (ref 150–400)
RBC: 4.47 MIL/uL (ref 4.22–5.81)
RDW: 15.9 % — ABNORMAL HIGH (ref 11.5–15.5)
WBC: 10.2 10*3/uL (ref 4.0–10.5)
nRBC: 0 % (ref 0.0–0.2)

## 2019-02-07 MED ORDER — PANTOPRAZOLE SODIUM 40 MG IV SOLR
40.0000 mg | INTRAVENOUS | Status: DC
  Administered 2019-02-07 – 2019-02-11 (×5): 40 mg via INTRAVENOUS
  Filled 2019-02-07 (×5): qty 40

## 2019-02-07 MED ORDER — FLEET ENEMA 7-19 GM/118ML RE ENEM
1.0000 | ENEMA | Freq: Every day | RECTAL | Status: DC
  Administered 2019-02-09: 11:00:00 1 via RECTAL
  Filled 2019-02-07 (×2): qty 1

## 2019-02-07 MED ORDER — LORAZEPAM 2 MG/ML IJ SOLN
1.0000 mg | Freq: Four times a day (QID) | INTRAMUSCULAR | Status: DC | PRN
  Administered 2019-02-07 – 2019-02-11 (×4): 1 mg via INTRAVENOUS
  Filled 2019-02-07 (×4): qty 1

## 2019-02-07 NOTE — Progress Notes (Signed)
Pt reports a small amount of blood in stool. Provider (Dr. Cyndia Skeeters) notified. Pt not in distress and denies pain. Pt is still complaining of anxiety and restlessness.

## 2019-02-07 NOTE — Progress Notes (Signed)
Spoke to Dr. Alessandra Bevels about pt reporting blood in stool. Per provider, holding daily enema for today. Provider requested prn miralax, given to pt.

## 2019-02-07 NOTE — Progress Notes (Signed)
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  NGT placed.  Patient underwent colonoscopy with colonic stent/prosthesis to distal sigmoid colon on 02/06/2019 by Dr. Watt Climes.   Patient and wife are decided to go with palliative/hospice care post discharge.  No further therapeutic chemotherapy per oncology, Dr. Burr Medico  Subjective: No major events overnight of this morning.  He says he did not sleep well.  Feels some abdominal distention but no pain.  Had about 4 small bowel movements last night.  Denies blood.  NG tube in place.  Objective: Vitals:   02/06/19 2318 02/07/19 0528 02/07/19 0900 02/07/19 1209  BP: (!) 135/95 (!) 139/92 (!) 129/94 (!) 128/98  Pulse: 86 87 (!) 105 (!) 101  Resp: 18     Temp: 98 F (36.7 C) 98.4 F (36.9 C) 98.2 F (36.8 C) 98.1 F (36.7 C)  TempSrc: Oral Oral Oral Oral  SpO2: 97% 98% 97% 98%  Weight:      Height:        Intake/Output Summary (Last 24 hours) at 02/07/2019 1546 Last data filed at 02/07/2019 0500 Gross per 24 hour  Intake 120 ml  Output 202 ml  Net -82 ml   Filed Weights   02/04/19 2254 02/06/19 0733  Weight: 84.5 kg 84.5 kg    Examination:  GENERAL: No acute distress.  Appears well.  HEENT: MMM.  Vision and hearing grossly intact.  NG tube in place.  Clamped. NECK: Supple.  No apparent JVD. LUNGS:  No IWOB. Good air movement bilaterally. HEART:  RRR. Heart sounds normal.  ABD: Bowel sounds present.  Abdomen  firm and distended.  No tenderness to palpitation. MSK/EXT:  Moves extremities. No apparent deformity or edema.  SKIN: no apparent skin lesion or wound NEURO: Awake, alert and oriented appropriately.  No gross deficit.  PSYCH: Calm. Normal affect.   I have personally reviewed the following labs and images:  Radiology Studies: Dg Abd Portable 1v  Result Date: 02/07/2019 CLINICAL DATA:  Abdominal distension. EXAM: PORTABLE ABDOMEN - 1 VIEW COMPARISON:  Radiograph of February 04, 2019. FINDINGS: Distal tip of nasogastric tube is seen in the stomach. Biliary stent is noted and unchanged in position. There is been interval placement of sigmoid colon stent. Mild dilatation of right and transverse colon is noted which is air-filled. No small bowel dilatation is noted. IMPRESSION: Interval placement of sigmoid colon stent. Stable position of biliary stent and nasogastric tube. Stable dilatation of proximal colon is noted. Electronically Signed   By: Marijo Conception M.D.   On: 02/07/2019 09:42    Microbiology: Recent Results (from the past 240 hour(s))  SARS Coronavirus 2 (CEPHEID - Performed in [place] hospital lab), Hosp Order     Status: None   Collection Time: 02/04/19 11:32 PM   Specimen: Nasopharyngeal Swab  Result Value Ref Range Status   SARS Coronavirus 2 NEGATIVE NEGATIVE Final    Comment: (NOTE) If result is NEGATIVE SARS-CoV-2 target nucleic acids are NOT DETECTED. The SARS-CoV-2 RNA is generally detectable in upper and lower  respiratory specimens during the acute phase of infection. The lowest  concentration of SARS-CoV-2 viral copies this assay can detect is 250  copies / mL. A negative result does not preclude SARS-CoV-2 infection  and should not be used as the sole basis for treatment or other  patient management decisions.  A negative result may occur with  improper specimen collection / handling, submission of specimen other  than nasopharyngeal swab, presence of viral  mutation(s) within the  areas targeted by this assay, and inadequate number of viral copies  (<250 copies / mL). A negative result must be combined with clinical  observations, patient history, and epidemiological information. If result is POSITIVE SARS-CoV-2 target nucleic acids are DETECTED. The SARS-CoV-2 RNA is generally detectable in upper and lower  respiratory specimens dur ing the acute phase of infection.  Positive  results are indicative of active infection with SARS-CoV-2.  Clinical  correlation with patient history and other diagnostic information is  necessary to determine patient infection status.  Positive results do  not rule out bacterial infection or co-infection with other viruses. If result is PRESUMPTIVE POSTIVE SARS-CoV-2 nucleic acids MAY BE PRESENT.   A presumptive positive result was obtained on the submitted specimen  and confirmed on repeat testing.  While 2019 novel coronavirus  (SARS-CoV-2) nucleic acids may be present in the submitted sample  additional confirmatory testing may be necessary for epidemiological  and / or clinical management purposes  to differentiate between  SARS-CoV-2 and other Sarbecovirus currently known to infect humans.  If clinically indicated additional testing with an alternate test  methodology [phone]) is advised. The SARS-CoV-2 RNA is generally  detectable in upper and lower respiratory sp ecimens during the acute  phase of infection. The expected result is Negative. Fact Sheet for Patients:  StrictlyIdeas.no Fact Sheet for Healthcare Providers: BankingDealers.co.za This test is not yet approved or cleared by the [place] FDA and has been authorized for detection and/or diagnosis of SARS-CoV-2 by FDA under an Emergency Use Authorization (EUA).  This EUA will remain in effect (meaning this test can be used) for the duration of the COVID-19 declaration under Section 564(b)(1)  of the Act, 21 U.S.C. section 360bbb-3(b)(1), unless the authorization is terminated or revoked sooner. Performed at [facility], [place] [address], [place], [place] [address]     Sepsis Labs: Invalid input(s): PROCALCITONIN, LACTICIDVEN  Urine analysis:    Component Value Date/Time   COLORURINE YELLOW 02/01/2019 1320   APPEARANCEUR CLEAR 02/01/2019 1320   LABSPEC 1.027 02/01/2019 1320   PHURINE 5.0 02/01/2019 1320   GLUCOSEU NEGATIVE 02/01/2019 1320   HGBUR NEGATIVE 02/01/2019 1320   BILIRUBINUR NEGATIVE 02/01/2019 1320   KETONESUR NEGATIVE 02/01/2019 1320   PROTEINUR NEGATIVE 02/01/2019 1320   NITRITE NEGATIVE 02/01/2019 1320   LEUKOCYTESUR NEGATIVE 02/01/2019 1320    Anemia Panel: No results for input(s): VITAMINB12, FOLATE, FERRITIN, TIBC, IRON, RETICCTPCT in the last 72 hours.  Thyroid Function Tests: No results for input(s): TSH, T4TOTAL, FREET4, T3FREE, THYROIDAB in the last 72 hours.  Lipid Profile: No results for input(s): CHOL, HDL, LDLCALC, TRIG, CHOLHDL, LDLDIRECT in the last 72 hours.  CBG: No results for input(s): GLUCAP in the last 168 hours.  HbA1C: No results for input(s): HGBA1C in the last 72 hours.  BNP (last 3 results): No results for input(s): PROBNP in the last 8760 hours.  Cardiac Enzymes: No results for input(s): CKTOTAL, CKMB, CKMBINDEX, TROPONINI in the last 168 hours.  Coagulation Profile: No results for  input(s): INR, PROTIME in the last 168 hours.  Liver Function Tests: Recent Labs  Lab 02/01/19 1320 02/04/19 2212 02/05/19 0515  AST 24 29 25   ALT 39 41 35  ALKPHOS 116 100 93  BILITOT 0.1* 0.8 0.5  PROT 6.7 6.1* 5.3*  ALBUMIN 3.1* 2.8* 2.3*   Recent Labs  Lab 02/01/19 1320  LIPASE 19   No results for input(s): AMMONIA in the last 168 hours.  Basic Metabolic Panel: Recent Labs  Lab 02/01/19 1320 02/04/19 2212 02/05/19 0515 02/07/19 0414  NA 135 130* 131* 134*  K 4.4 4.4 4.2 4.1  CL 99 92* 95* 99  CO2 27  26 26 25   GLUCOSE 141* 127* 138* 122*  BUN 7 7 8 8   CREATININE 0.67 0.81 0.73 0.86  CALCIUM 8.7* 8.8* 8.1* 8.6*   GFR: Estimated Creatinine Clearance: 100.3 mL/min (by C-G formula based on SCr of 0.86 mg/dL).  CBC: Recent Labs  Lab 02/01/19 1320 02/04/19 2212 02/05/19 0515 02/07/19 0414  WBC 8.8 12.4* 9.0 10.2  NEUTROABS 6.7 9.7*  --   --   HGB 12.2* 12.9* 11.3* 12.4*  HCT 39.5 39.2 34.0* 38.6*  MCV 89.4 84.3 84.2 86.4  PLT 330 467* 419* 499*    Procedures:  02/06/2019: Colonoscopy with colonic stent/prostatitis  Microbiology: COVID-19 negative.  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 Burr Medico -Colonoscopy with colonic stent/prosthesis placement by Dr. Watt Climes on 02/06/2019 -Appreciate GI recs-maintain NGT, start IV PPI, Fleet enema, MiraLAX, repeat x-ray, hold anticoag.  Recurrent malignant ascites due to metastatic pancreatic cancer -Reportedly scheduled for peritoneal catheter placement on 02/09/2019 with IR outpatient -Will clarify with IR.  Chronic anticoagulation: Reports history of left upper extremity/shoulder DVT although upper extremity venous Doppler did not show thrombus in 09/08/2018.  No history of A. Fib.  Obviously high risk for DVT due to malignancy. -Xarelto on hold for procedure -Continue prophylactic Lovenox  Social/Ethics:  -Patient is now DNR planning to enroll in hospice with [employer].  No further chemo.  Tobacco use disorder -Encourage cessation and provided nicotine patch  DVT prophylaxis: Subcu Lovenox Code Status: DNR Family Communication: Patient to update family let me know if any question Disposition Plan: Remains inpatient due to colonic obstruction.  NG tube in place.  Remains on IV fluid.  Consultants: GI, oncology   Antimicrobials: Anti-infectives (From admission, onward)   None      Sch Meds:  Scheduled Meds: . diatrizoate meglumine-sodium  30 mL Oral Once  . enoxaparin (LOVENOX) injection  40 mg Subcutaneous Daily  . nicotine  21 mg Transdermal Daily  . pantoprazole (PROTONIX) IV  40 mg Intravenous Q24H  . polyethylene glycol  17 g Oral Daily  . sodium chloride flush  10-40 mL Intracatheter Q12H  . sodium phosphate  1 enema Rectal Daily   Continuous Infusions: . dextrose 5 % and 0.9% NaCl 75 mL/hr at 02/07/19 0200   PRN Meds:.LORazepam, morphine injection, ondansetron **OR** ondansetron (ZOFRAN) IV, polyethylene glycol, sodium chloride flush   Parsa Rickett T. Santel  If 7PM-7AM, please contact night-coverage [email or web address] Password [facility] 02/07/2019, 3:46 PM

## 2019-02-07 NOTE — Plan of Care (Signed)

## 2019-02-07 NOTE — Progress Notes (Addendum)
[facility] Gastroenterology Progress Note  Ernest Wu 58 y.o. May 05, 1961  CC: Colonic obstruction, metastatic pancreatic cancer   Subjective: Continues to have abdominal distention.  Had a small bowel movement this morning.  Dark material/blood clots noted in NG suction.  Hemoglobin stable.  Normal BUN.  ROS : Afebrile.  Negative for chest pain.  Minimal shortness of breath.   Objective: Vital signs in last 24 hours: Vitals:   02/07/19 0528 02/07/19 0900  BP: (!) 139/92 (!) 129/94  Pulse: 87 (!) 105  Resp:    Temp: 98.4 F (36.9 C) 98.2 F (36.8 C)  SpO2: 98% 97%    Physical Exam:  General.  sitting in the recliner.  NG suction in place. Heart.RRR Abd : Remains distended, Generalized discomfort, BS + , No peritoneal signs  LE : no edema  Neuro : A/o X 3 Psych : Mood and affect normal   Lab Results: Recent Labs    02/05/19 0515 02/07/19 0414  NA 131* 134*  K 4.2 4.1  CL 95* 99  CO2 26 25  GLUCOSE 138* 122*  BUN 8 8  CREATININE 0.73 0.86  CALCIUM 8.1* 8.6*   Recent Labs    02/04/19 2212 02/05/19 0515  AST 29 25  ALT 41 35  ALKPHOS 100 93  BILITOT 0.8 0.5  PROT 6.1* 5.3*  ALBUMIN 2.8* 2.3*   Recent Labs    02/04/19 2212 02/05/19 0515 02/07/19 0414  WBC 12.4* 9.0 10.2  NEUTROABS 9.7*  --   --   HGB 12.9* 11.3* 12.4*  HCT 39.2 34.0* 38.6*  MCV 84.3 84.2 86.4  PLT 467* 419* 499*   No results for input(s): LABPROT, INR in the last 72 hours.    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  Status post colonic stent placement yesterday. -Metastatic pancreatic adenocarcinoma. -Blood clot/coffee-ground material NG suction.  Hemoglobin stable.  Normal BUN. -Left subclavian vein thrombosis.  Anticoagulation currently on hold.  Recommendations ------------------------ -Continue NG suction for now.  Continue MiraLAX.  Add daily  fleet enema. -Start IV PPI. -Repeat x-ray in the morning.  Hold anticoagulation for today. -GI will follow   Otis Brace MD, [place] 02/07/2019, 11:31 AM  Contact #  [phone]

## 2019-02-07 NOTE — Plan of Care (Signed)
  Problem: Education: Goal: Knowledge of General Education information will improve Description Including pain rating scale, medication(s)/side effects and non-pharmacologic comfort measures Outcome: Progressing   

## 2019-02-07 NOTE — Progress Notes (Signed)
Small amount of blood clots were noted in pt's NG tube. Pt's NG tube was successfully  flushed with 60 ml of normal saline. The color of the drainage was light brown. Pt tolerated procedure well. Will continue to monitor.

## 2019-02-08 ENCOUNTER — Inpatient Hospital Stay (HOSPITAL_COMMUNITY): Payer: Managed Care, Other (non HMO)

## 2019-02-08 DIAGNOSIS — F419 Anxiety disorder, unspecified: Secondary | ICD-10-CM

## 2019-02-08 DIAGNOSIS — G4700 Insomnia, unspecified: Secondary | ICD-10-CM

## 2019-02-08 LAB — CBC
HCT: 35.5 % — ABNORMAL LOW (ref 39.0–52.0)
Hemoglobin: 11.7 g/dL — ABNORMAL LOW (ref 13.0–17.0)
MCH: 28.2 pg (ref 26.0–34.0)
MCHC: 33 g/dL (ref 30.0–36.0)
MCV: 85.5 fL (ref 80.0–100.0)
Platelets: 420 10*3/uL — ABNORMAL HIGH (ref 150–400)
RBC: 4.15 MIL/uL — ABNORMAL LOW (ref 4.22–5.81)
RDW: 16.2 % — ABNORMAL HIGH (ref 11.5–15.5)
WBC: 10.1 10*3/uL (ref 4.0–10.5)
nRBC: 0 % (ref 0.0–0.2)

## 2019-02-08 LAB — BASIC METABOLIC PANEL
Anion gap: 9 (ref 5–15)
BUN: 9 mg/dL (ref 6–20)
CO2: 24 mmol/L (ref 22–32)
Calcium: 8.3 mg/dL — ABNORMAL LOW (ref 8.9–10.3)
Chloride: 102 mmol/L (ref 98–111)
Creatinine, Ser: 0.77 mg/dL (ref 0.61–1.24)
GFR calc Af Amer: >60 mL/min (ref >60)
GFR calc non Af Amer: >60 mL/min (ref >60)
Glucose, Bld: 125 mg/dL — ABNORMAL HIGH (ref 70–99)
Potassium: 3.8 mmol/L (ref 3.5–5.1)
Sodium: 135 mmol/L (ref 135–145)

## 2019-02-08 MED ORDER — NICOTINE 21 MG/24HR TD PT24
21.0000 mg | MEDICATED_PATCH | Freq: Once | TRANSDERMAL | Status: DC
  Filled 2019-02-08: qty 1

## 2019-02-08 MED ORDER — NICOTINE 21 MG/24HR TD PT24
21.0000 mg | MEDICATED_PATCH | Freq: Every day | TRANSDERMAL | Status: DC
  Filled 2019-02-08 (×2): qty 1

## 2019-02-08 MED ORDER — SODIUM CHLORIDE 0.9 % IV SOLN
INTRAVENOUS | Status: DC
  Administered 2019-02-09: 10 mL/h via INTRAVENOUS

## 2019-02-08 MED ORDER — ZOLPIDEM TARTRATE 5 MG PO TABS: 5.0000 mg | ORAL_TABLET | Freq: Every evening | ORAL | Status: DC | PRN

## 2019-02-08 MED ORDER — CEFAZOLIN SODIUM-DEXTROSE 2-4 GM/100ML-% IV SOLN
2.0000 g | INTRAVENOUS | Status: AC
  Filled 2019-02-08: qty 100

## 2019-02-08 MED ORDER — POLYETHYLENE GLYCOL 3350 17 G PO PACK
17.0000 g | PACK | Freq: Two times a day (BID) | ORAL | Status: DC
  Administered 2019-02-08 – 2019-02-10 (×5): 17 g via ORAL
  Filled 2019-02-08 (×6): qty 1

## 2019-02-08 NOTE — Progress Notes (Signed)
[facility] Gastroenterology Progress Note  Ernest Wu 58 y.o. [birth year]  CC: Colonic obstruction, metastatic pancreatic cancer   Subjective: NG tube came out accidentally last night.  Patient denies nausea and vomiting.  Continues to have abdominal distention.  He had 2 small bowel movements yesterday.  He had streaks of blood in the stool yesterday.  ROS : Afebrile.  Negative for chest pain.  Minimal shortness of breath.   Objective: Vital signs in last 24 hours: Vitals:   02/08/19 0538 02/08/19 0540  BP: (!) 136/102 (!) 135/105  Pulse: (!) 104 (!) 106  Resp: 14 14  Temp: (!) 97.5 F (36.4 C) 97.8 F (36.6 C)  SpO2: 100% 98%    Physical Exam:  General.  Sitting in the bed comfortably.  Not in acute distress. Heart.RRR Abd : Remains distended, Generalized discomfort, BS + , No peritoneal signs  LE : no edema  Neuro : A/o X 3 Psych : Mood and affect normal   Lab Results: Recent Labs    02/07/19 0414 02/08/19 0816  NA 134* 135  K 4.1 3.8  CL 99 102  CO2 25 24  GLUCOSE 122* 125*  BUN 8 9  CREATININE 0.86 0.77  CALCIUM 8.6* 8.3*   No results for input(s): AST, ALT, ALKPHOS, BILITOT, PROT, ALBUMIN in the last 72 hours. Recent Labs    02/07/19 0414 02/08/19 0816  WBC 10.2 10.1  HGB 12.4* 11.7*  HCT 38.6* 35.5*  MCV 86.4 85.5  PLT 499* 420*   No results for input(s): LABPROT, INR in the last 72 hours.    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  Status post colonic stent placement 07/03  -Metastatic pancreatic adenocarcinoma. -Blood clot/coffee-ground material NG suction.  Hemoglobin stable.  Normal BUN. -Left subclavian vein thrombosis.  Anticoagulation currently on hold.  Recommendations ------------------------ -His abdomen remains distended but x-ray this morning showed interval passage of colonic contrast. -Start scheduled  MiraLAX twice a day.  Continue daily enema. -Start clear liquid diet. -Continue IV PPI for now. -Repeat x-ray in the morning.   - Hold therapeutic anticoagulation for now.  Okay to use prophylactic Lovenox.  If no improvement, he may need diverting colostomy. -GI will follow   Otis Brace MD, [place] 02/08/2019, 10:17 AM  Contact #  [phone]

## 2019-02-08 NOTE — Progress Notes (Signed)
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  NGT placed.  Patient underwent colonoscopy with colonic stent/prosthesis to distal sigmoid colon on 02/06/2019 by Dr. Watt Climes.   Patient and wife are decided to go with palliative/hospice care post discharge.  No further therapeutic chemotherapy per oncology, Dr. Burr Medico  Subjective: No major events overnight of this morning.  NG tube came out last night about 3 AM.  He says he did have good sleep last night but he denies pain.  Abdomen is still feels tense but no significant pain.  Denies nausea or vomiting.  Has not had bowel movements yet.   Objective: Vitals:   02/08/19 0057 02/08/19 0101 02/08/19 0538 02/08/19 0540  BP: (!) 131/98 (!) 122/93 (!) 136/102 (!) 135/105  Pulse: (!) 102 (!) 102 (!) 104 (!) 106  Resp: 16 14 14 14   Temp:   (!) 97.5 F (36.4 C) 97.8 F (36.6 C)  TempSrc:   Oral Oral  SpO2: 98% 99% 100% 98%  Weight:      Height:        Intake/Output Summary (Last 24 hours) at 02/08/2019 1428 Last data filed at 02/07/2019 1550 Gross per 24 hour  Intake 120 ml  Output -  Net 120 ml   Filed Weights   02/04/19 2254 02/06/19 0733  Weight: 84.5 kg 84.5 kg    Examination:  GENERAL: No acute distress.  Sitting on the edge of the bed. HEENT: MMM.  Vision and hearing grossly intact.  Sclera anicteric. NECK: Supple.  No apparent JVD. LUNGS:  No IWOB. Good air movement bilaterally. HEART:  RRR. Heart sounds  normal.  ABD: Bowel sounds diminished.  Abdomen firm and distended.  No tenderness to palpation. MSK/EXT:  Moves all extremities. No apparent deformity. No edema bilaterally.  SKIN: no apparent skin lesion or wound NEURO: Awake, alert and oriented appropriately.  No gross deficit.  PSYCH: Calm. Normal affect.  I have personally reviewed the following labs and images:  Radiology Studies: Dg Abd Portable 1v  Result Date: 02/08/2019 CLINICAL DATA:  Abdominal distension EXAM: PORTABLE ABDOMEN - 1 VIEW COMPARISON:  Multiple previous films.  The most recent is 02/07/2019 FINDINGS: The NG tube is not visualized and was likely removed. It could have also been pulled back into the esophagus. Recommend clinical correlation. The biliary stent and rectosigmoid stents are stable. There is less contrast in the right colon. Some persistent air distended transverse colon. Scattered air-filled small bowel loops but no distension. No definite free air. IMPRESSION: Some interval passage of colonic contrast. No findings for small bowel obstruction or free air. The NG tube is no longer visualized. Electronically Signed   By: Marijo Sanes M.D.   On: 02/08/2019 09:01    Microbiology: Recent Results (from the past 240 hour(s))  SARS Coronavirus 2 (CEPHEID - Performed in [facility] lab), Hosp Order     Status: None   Collection Time: 02/04/19 11:32 PM   Specimen: Nasopharyngeal Swab  Result Value Ref Range Status   SARS Coronavirus 2 NEGATIVE NEGATIVE  Final    Comment: (NOTE) If result is NEGATIVE SARS-CoV-2 target nucleic acids are NOT DETECTED. The SARS-CoV-2 RNA is generally detectable in upper and lower  respiratory specimens during the acute phase of infection. The lowest  concentration of SARS-CoV-2 viral copies this assay can detect is 250  copies / mL. A negative result does not preclude SARS-CoV-2 infection  and should not be used as the sole basis for treatment or other  patient management  decisions.  A negative result may occur with  improper specimen collection / handling, submission of specimen other  than nasopharyngeal swab, presence of viral mutation(s) within the  areas targeted by this assay, and inadequate number of viral copies  (<250 copies / mL). A negative result must be combined with clinical  observations, patient history, and epidemiological information. If result is POSITIVE SARS-CoV-2 target nucleic acids are DETECTED. The SARS-CoV-2 RNA is generally detectable in upper and lower  respiratory specimens dur ing the acute phase of infection.  Positive  results are indicative of active infection with SARS-CoV-2.  Clinical  correlation with patient history and other diagnostic information is  necessary to determine patient infection status.  Positive results do  not rule out bacterial infection or co-infection with other viruses. If result is PRESUMPTIVE POSTIVE SARS-CoV-2 nucleic acids MAY BE PRESENT.   A presumptive positive result was obtained on the submitted specimen  and confirmed on repeat testing.  While 2019 novel coronavirus  (SARS-CoV-2) nucleic acids may be present in the submitted sample  additional confirmatory testing may be necessary for epidemiological  and / or clinical management purposes  to differentiate between  SARS-CoV-2 and other Sarbecovirus currently known to infect humans.  If clinically indicated additional testing with an alternate test  methodology [phone]) is advised. The SARS-CoV-2 RNA is generally  detectable in upper and lower respiratory sp ecimens during the acute  phase of infection. The expected result is Negative. Fact Sheet for Patients:  StrictlyIdeas.no Fact Sheet for Healthcare Providers: BankingDealers.co.za This test is not yet approved or cleared by the [place] FDA and has been authorized for detection and/or diagnosis of SARS-CoV-2 by FDA under an  Emergency Use Authorization (EUA).  This EUA will remain in effect (meaning this test can be used) for the duration of the COVID-19 declaration under Section 564(b)(1) of the Act, 21 U.S.C. section 360bbb-3(b)(1), unless the authorization is terminated or revoked sooner. Performed at [facility], [place] [address], [place], [place] [address]     Sepsis Labs: Invalid input(s): PROCALCITONIN, LACTICIDVEN  Urine analysis:    Component Value Date/Time   COLORURINE YELLOW 02/01/2019 1320   APPEARANCEUR CLEAR 02/01/2019 1320   LABSPEC 1.027 02/01/2019 1320   PHURINE 5.0 02/01/2019 1320   GLUCOSEU NEGATIVE 02/01/2019 1320   HGBUR NEGATIVE 02/01/2019 1320   BILIRUBINUR NEGATIVE 02/01/2019 1320   KETONESUR NEGATIVE 02/01/2019 1320   PROTEINUR NEGATIVE 02/01/2019 1320   NITRITE NEGATIVE 02/01/2019 1320   LEUKOCYTESUR NEGATIVE 02/01/2019 1320    Anemia Panel: No results for input(s): VITAMINB12, FOLATE, FERRITIN, TIBC, IRON, RETICCTPCT in the last 72 hours.  Thyroid Function Tests: No results for input(s): TSH, T4TOTAL, FREET4, T3FREE, THYROIDAB in the last 72 hours.  Lipid Profile: No results for input(s): CHOL, HDL, LDLCALC, TRIG, CHOLHDL, LDLDIRECT in the last 72 hours.  CBG: No results for input(s): GLUCAP in the last 168 hours.  HbA1C: No results for input(s): HGBA1C in the last 72 hours.  BNP (last 3 results): No results for input(s):  PROBNP in the last 8760 hours.  Cardiac Enzymes: No results for input(s): CKTOTAL, CKMB, CKMBINDEX, TROPONINI in the last 168 hours.  Coagulation Profile: No results for input(s): INR, PROTIME in the last 168 hours.  Liver Function Tests: Recent Labs  Lab 02/04/19 2212 02/05/19 0515  AST 29 25  ALT 41 35  ALKPHOS 100 93  BILITOT 0.8 0.5  PROT 6.1* 5.3*  ALBUMIN 2.8* 2.3*   No results for input(s): LIPASE, AMYLASE in the last 168 hours. No results for input(s): AMMONIA in the last 168 hours.  Basic Metabolic Panel:  Recent Labs  Lab 02/04/19 2212 02/05/19 0515 02/07/19 0414 02/08/19 0816  NA 130* 131* 134* 135  K 4.4 4.2 4.1 3.8  CL 92* 95* 99 102  CO2 26 26 25 24   GLUCOSE 127* 138* 122* 125*  BUN 7 8 8 9   CREATININE 0.81 0.73 0.86 0.77  CALCIUM 8.8* 8.1* 8.6* 8.3*   GFR: Estimated Creatinine Clearance: 107.8 mL/min (by C-G formula based on SCr of 0.77 mg/dL).  CBC: Recent Labs  Lab 02/04/19 2212 02/05/19 0515 02/07/19 0414 02/08/19 0816  WBC 12.4* 9.0 10.2 10.1  NEUTROABS 9.7*  --   --   --   HGB 12.9* 11.3* 12.4* 11.7*  HCT 39.2 34.0* 38.6* 35.5*  MCV 84.3 84.2 86.4 85.5  PLT 467* 419* 499* 420*    Procedures:  02/06/2019: Colonoscopy with colonic stent/prostatitis  Microbiology: COVID-19 negative.  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 Burr Medico -Colonoscopy with colonic stent/prosthesis placement by Dr. Watt Climes on 02/06/2019 -NG tube came out on 02/08/2019 -Appreciate GI recs- IV PPI, scheduled MiraLAX, daily enema, clear liquid diet, repeat x-ray in a.m. -Patient may need diverting colostomy if no improvement.  Recurrent malignant ascites due to metastatic pancreatic cancer -Reportedly scheduled for peritoneal catheter placement on 02/09/2019 with IR outpatient.  This is very risky at this time due to bowel distention/obstruction.  Chronic anticoagulation: Reports history of left upper extremity/shoulder DVT although upper extremity venous Doppler did not show thrombus in 09/08/2018.  No history of A. Fib.  Obviously high risk for DVT due to malignancy. -Xarelto on hold for procedure -Continue prophylactic Lovenox  Social/Ethics:  -Patient is now DNR planning to enroll in hospice with [employer].  No further chemo.  Tobacco use disorder -Encourage  cessation and provided nicotine patch  Anxiety/insomnia -Continue Ativan -Add nightly Ambien.  DVT prophylaxis: Subcu Lovenox Code Status: DNR Family Communication: Patient to update family let me know if any question Disposition Plan: Remains inpatient due to colonic obstruction.  Consultants: GI, oncology  Antimicrobials: Anti-infectives (From admission, onward)   Start     Dose/Rate Route Frequency Ordered Stop   02/09/19 0600  ceFAZolin (ANCEF) IVPB 2g/100 mL premix     2 g 200 mL/hr over 30 Minutes Intravenous To Radiology 02/08/19 0753 02/10/19 0600      Sch Meds:  Scheduled Meds: . diatrizoate meglumine-sodium  30 mL Oral Once  . enoxaparin (LOVENOX) injection  40 mg Subcutaneous Daily  . nicotine  21 mg Transdermal Daily  . pantoprazole (PROTONIX) IV  40 mg Intravenous Q24H  . polyethylene glycol  17 g Oral BID  . sodium chloride flush  10-40 mL Intracatheter Q12H  . sodium phosphate  1 enema Rectal Daily   Continuous Infusions: . [START ON 02/09/2019] sodium chloride    . [  START ON 02/09/2019]  ceFAZolin (ANCEF) IV    . dextrose 5 % and 0.9% NaCl 75 mL/hr at 02/07/19 0200   PRN Meds:.LORazepam, morphine injection, ondansetron **OR** ondansetron (ZOFRAN) IV, sodium chloride flush   Arla Boutwell T. Gooding  If 7PM-7AM, please contact night-coverage [email or web address] Password [email or web address] 02/08/2019, 2:28 PM

## 2019-02-08 NOTE — Plan of Care (Signed)
  Problem: Pain Managment: Goal: General experience of comfort will improve Outcome: Progressing   

## 2019-02-09 ENCOUNTER — Inpatient Hospital Stay (HOSPITAL_COMMUNITY): Payer: Managed Care, Other (non HMO)

## 2019-02-09 ENCOUNTER — Other Ambulatory Visit: Payer: Managed Care, Other (non HMO)

## 2019-02-09 ENCOUNTER — Ambulatory Visit: Payer: Managed Care, Other (non HMO) | Admitting: Nurse Practitioner

## 2019-02-09 ENCOUNTER — Ambulatory Visit: Payer: Managed Care, Other (non HMO)

## 2019-02-09 ENCOUNTER — Ambulatory Visit (HOSPITAL_COMMUNITY): Admission: RE | Admit: 2019-02-09 | Payer: Managed Care, Other (non HMO) | Source: Ambulatory Visit

## 2019-02-09 LAB — BASIC METABOLIC PANEL
Anion gap: 10 (ref 5–15)
BUN: 11 mg/dL (ref 6–20)
CO2: 21 mmol/L — ABNORMAL LOW (ref 22–32)
Calcium: 8.4 mg/dL — ABNORMAL LOW (ref 8.9–10.3)
Chloride: 100 mmol/L (ref 98–111)
Creatinine, Ser: 0.82 mg/dL (ref 0.61–1.24)
GFR calc Af Amer: >60 mL/min (ref >60)
GFR calc non Af Amer: >60 mL/min (ref >60)
Glucose, Bld: 139 mg/dL — ABNORMAL HIGH (ref 70–99)
Potassium: 3.9 mmol/L (ref 3.5–5.1)
Sodium: 131 mmol/L — ABNORMAL LOW (ref 135–145)

## 2019-02-09 LAB — NOVEL CORONAVIRUS, NAA (HOSP ORDER, SEND-OUT TO REF LAB; TAT 18-24 HRS): SARS-CoV-2, NAA: NOT DETECTED

## 2019-02-09 LAB — CBC
HCT: 37.3 % — ABNORMAL LOW (ref 39.0–52.0)
Hemoglobin: 12.4 g/dL — ABNORMAL LOW (ref 13.0–17.0)
MCH: 28.1 pg (ref 26.0–34.0)
MCHC: 33.2 g/dL (ref 30.0–36.0)
MCV: 84.4 fL (ref 80.0–100.0)
Platelets: 463 10*3/uL — ABNORMAL HIGH (ref 150–400)
RBC: 4.42 MIL/uL (ref 4.22–5.81)
RDW: 15.9 % — ABNORMAL HIGH (ref 11.5–15.5)
WBC: 11.1 10*3/uL — ABNORMAL HIGH (ref 4.0–10.5)
nRBC: 0 % (ref 0.0–0.2)

## 2019-02-09 LAB — PROTIME-INR
INR: 1.1 (ref 0.8–1.2)
Prothrombin Time: 14.2 seconds (ref 11.4–15.2)

## 2019-02-09 MED ORDER — ALBUMIN HUMAN 25 % IV SOLN
50.0000 g | Freq: Once | INTRAVENOUS | Status: AC
  Administered 2019-02-09: 50 g via INTRAVENOUS
  Filled 2019-02-09: qty 200

## 2019-02-09 MED ORDER — MAGNESIUM CITRATE PO SOLN
1.0000 | Freq: Once | ORAL | Status: AC
  Administered 2019-02-09: 1 via ORAL
  Filled 2019-02-09: qty 296

## 2019-02-09 NOTE — Progress Notes (Signed)
Pt's spouse asked to speak with the charge nurse, I spoke with wife and explained to her the visitor restrictions and following hospital policy. Pt's wife very upset, stated "I'm going to contact my lawyer to figure out my rights."

## 2019-02-09 NOTE — Plan of Care (Signed)

## 2019-02-09 NOTE — Progress Notes (Addendum)
Patient ID: Ernest Wu, male   DOB: 06/19/1961, 58 y.o.   MRN: [id number]   Pt was scheduled as OP today for tunneled peritoneal drain placement for recurrent ascites  Per notes now with bowel obstruction. IMPRESSION: Biliary stent and rectosigmoid stent noted in stable position. Slightly distended loops of small and large bowel are noted most consistent adynamic ileus. Follow-up exam suggested in order to demonstrate resolution and to exclude bowel obstruction.  Will need Inpt order for tunneled catheter placement when MD feels appropriate to move ahead Or Can be rescheduled as OP when timing and status is appropriate

## 2019-02-09 NOTE — Consult Note (Signed)
[facility] Surgery Consult Note  ESLI JERNIGAN Aug 25, 1960  [id number].    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 Patient was diagnosed with pancreatic cancer 06/2018 and was treated with neoadjuvant chemo. He underwent diagnostic laparoscopy and cholecystectomy with liver biopsy 11/10/2018 by Dr. Barry Dienes; Whipple was aborted due to new liver mets found during the procedure. He was recently started on second-line chemotherapy with Gemcitabine and Abraxane on 12/22/18 but this was stopped due to intolerance. He is followed by Dr. Burr Medico. Based on their last meeting they had discussed hospice but patient undecided about whether he wishes to enroll.  He reports having worsening abdominal bloating, nausea, vomiting, and constipation for about 2 weeks. CT abdomen pelvis with rectal contrast on 7/2 showed marked dilatation of colon with cecum up to 10.3 cm with collapsed segment of rectosigmoid colon with possible stricture but contrast was able to pass through this segment into the upstream colon.  CT scan also showed nodular infiltration of the omentum and mesentery concerning for carcinomatosis, as well as moderate malignant ascites. NG tube was placed and gastroenterology consulted. Patient underwent colonic stent placement on 7/3. He unfortunately has still not had significant return in bowel function, therefore general surgery was consulted for recommendations.  Patient states that he has had 2 very small BMs yesterday and today, but has not passed any flatus. He feels bloated with intermittent nausea, no emesis. He is tolerating miralax BID and sips of clear liquids. Xray today shows slightly distended loops of small and large bowel.    ROS: Review of Systems   Constitutional: Positive for malaise/fatigue.  HENT: Negative.   Eyes: Negative.   Respiratory: Negative.   Cardiovascular: Negative.   Gastrointestinal: Positive for abdominal pain, constipation and nausea. Negative for vomiting.  Genitourinary: Negative.   Musculoskeletal: Negative.   Skin: Negative.   Neurological: Negative.    All systems reviewed and otherwise negative except for as above  Family History  Problem Relation Age of Onset  . Stroke Mother   . Hypertension Mother   . CAD Father        CABG  . Pancreatic cancer Paternal Grandfather   . Breast cancer Maternal Aunt   . Lymphoma Paternal Uncle   . Other Maternal Uncle        [name]  . Stroke Maternal Grandmother   . Prostate cancer Cousin        pat first cousin, dx between 35-60    Past Medical History:  Diagnosis Date  . Anxiety   . Family history of breast cancer   . Family history of colonic polyps   . Family history of pancreatic cancer   . Family history of prostate cancer   . GERD (gastroesophageal reflux disease)   . Intestinal obstruction ([place]) 02/05/2019  . pancreatic ca 06/2018   Pancreatic    Past Surgical History:  Procedure Laterality Date  . BILIARY STENT PLACEMENT  06/10/2018   Procedure: BILIARY STENT PLACEMENT;  Surgeon: Ronnette Juniper, MD;  Location: [facility] ENDOSCOPY;  Service: Gastroenterology;;  . CHOLECYSTECTOMY N/A 11/10/2018   Procedure: Laparoscopic Cholecystectomy;  Surgeon: Stark Klein, MD;  Location: [place];  Service: General;  Laterality: N/A;  . COLONIC STENT PLACEMENT N/A 02/06/2019   Procedure: COLONIC STENT PLACEMENT;  Surgeon:  Clarene Essex, MD;  Location: [place];  Service: Endoscopy;  Laterality: N/A;  . COLONOSCOPY WITH PROPOFOL N/A 02/06/2019   Procedure: COLONOSCOPY WITH PROPOFOL for  colonic stent placement;  Surgeon: Clarene Essex, MD;  Location: [place];  Service: Endoscopy;  Laterality: N/A;  . DIAGNOSTIC LAPAROSCOPIC LIVER BIOPSY N/A 11/10/2018   Procedure:  Diagnostic Laparoscopic Liver Biopsy;  Surgeon: Stark Klein, MD;  Location: [place];  Service: General;  Laterality: N/A;  . ERCP N/A 06/10/2018   Procedure: ENDOSCOPIC RETROGRADE CHOLANGIOPANCREATOGRAPHY (ERCP);  Surgeon: Ronnette Juniper, MD;  Location: [place];  Service: Gastroenterology;  Laterality: N/A;  . ESOPHAGOGASTRODUODENOSCOPY (EGD) WITH PROPOFOL N/A 06/10/2018   Procedure: ESOPHAGOGASTRODUODENOSCOPY (EGD) WITH PROPOFOL;  Surgeon: Ronnette Juniper, MD;  Location: [place];  Service: Gastroenterology;  Laterality: N/A;  . FINE NEEDLE ASPIRATION  06/10/2018   Procedure: FINE NEEDLE ASPIRATION (FNA) LINEAR;  Surgeon: Ronnette Juniper, MD;  Location: [place];  Service: Gastroenterology;;  . IR RADIOLOGIST EVAL & MGMT  12/10/2018  . PORT-A-CATH REMOVAL N/A 11/10/2018   Procedure: REMOVAL PORT-A-CATH;  Surgeon: Stark Klein, MD;  Location: [place];  Service: General;  Laterality: N/A;  . PORTACATH PLACEMENT N/A 07/02/2018   Procedure: INSERTION PORT-A-CATH;  Surgeon: Stark Klein, MD;  Location: [place];  Service: General;  Laterality: N/A;  . SPHINCTEROTOMY  06/10/2018   Procedure: SPHINCTEROTOMY;  Surgeon: Ronnette Juniper, MD;  Location: [facility] ENDOSCOPY;  Service: Gastroenterology;;  . UPPER ESOPHAGEAL ENDOSCOPIC ULTRASOUND (EUS) N/A 06/10/2018   Procedure: UPPER ESOPHAGEAL ENDOSCOPIC ULTRASOUND (EUS);  Surgeon: Ronnette Juniper, MD;  Location: [place];  Service: Gastroenterology;  Laterality: N/A;  . VASECTOMY      Social History:  reports that he quit smoking 7 days ago. His smoking use included cigarettes. He has a 7.50 pack-year smoking history. He has never used smokeless tobacco. He reports current alcohol use. He reports that he does not use drugs.  Allergies:  Allergies  Allergen Reactions  . Contrast Media [Iodinated Diagnostic Agents] Other (See Comments)    Burning sensation [SEE CONTRAST MEDIA]  . Iohexol Anaphylaxis, Itching and Rash    Medications Prior to Admission  Medication Sig  Dispense Refill  . ALPRAZolam (XANAX) 0.25 MG tablet Take 0.25 mg by mouth 2 (two) times daily as needed for anxiety.    . diphenoxylate-atropine (LOMOTIL) 2.5-0.025 MG tablet Take 2 tablets by mouth 4 (four) times daily as needed for diarrhea or loose stools. (Patient not taking: Reported on 12/26/2018) 40 tablet 0  . docusate sodium (COLACE) 100 MG capsule Take 200 mg by mouth daily as needed for mild constipation.    Marland Kitchen esomeprazole (NEXIUM) 20 MG capsule Take 20 mg by mouth daily after breakfast.     . folic acid (FOLVITE) 1 MG tablet Take 1 tablet (1 mg total) by mouth daily after breakfast. 30 tablet 3  . lactulose (CHRONULAC) 10 GM/15ML solution Take 45 mLs (30 g total) by mouth 3 (three) times daily. (Patient taking differently: Take 30 g by mouth 2 (two) times daily as needed for moderate constipation. ) 473 mL 2  . magic mouthwash w/lidocaine SOLN Take 5 mLs by mouth 4 (four) times daily as needed for mouth pain. 240 mL 2  . Methylcobalamin (B-12) 5000 MCG TBDP Take 5,000 mcg by mouth daily.    . ondansetron (ZOFRAN) 8 MG tablet Take 1 tablet (8 mg total) by mouth every 8 (eight) hours as needed for nausea or vomiting. 30 tablet 1  . oxyCODONE (OXY IR/ROXICODONE) 5 MG immediate release  tablet Take 5 mg by mouth every 4 (four) hours as needed for severe pain.    . polyethylene glycol (MIRALAX / GLYCOLAX) 17 g packet Take 17 g by mouth daily as needed for mild constipation.     . prochlorperazine (COMPAZINE) 10 MG tablet Take 1 tablet (10 mg total) by mouth every 6 (six) hours as needed (NAUSEA). 30 tablet 1  . rivaroxaban (XARELTO) 20 MG TABS tablet Take 1 tablet (20 mg total) by mouth daily after breakfast. 30 tablet 2  . simethicone (MYLICON) 384 MG chewable tablet Chew 250 mg by mouth every 6 (six) hours as needed for flatulence.     . Syringe, Disposable, (10-12CC SYRINGE) 12 ML MISC 10 mLs by Does not apply route as needed. 1 each 1  . traMADol (ULTRAM) 50 MG tablet Take 50 mg by mouth  every 6 (six) hours as needed (pain).       Prior to Admission medications   Medication Sig Start Date End Date Taking? Authorizing Provider  ALPRAZolam (XANAX) 0.25 MG tablet Take 0.25 mg by mouth 2 (two) times daily as needed for anxiety.    [provider]  diphenoxylate-atropine (LOMOTIL) 2.5-0.025 MG tablet Take 2 tablets by mouth 4 (four) times daily as needed for diarrhea or loose stools. Patient not taking: Reported on 12/26/2018 06/27/18   Alla Feeling, NP  docusate sodium (COLACE) 100 MG capsule Take 200 mg by mouth daily as needed for mild constipation.    [provider]  esomeprazole (NEXIUM) 20 MG capsule Take 20 mg by mouth daily after breakfast.     [provider]  folic acid (FOLVITE) 1 MG tablet Take 1 tablet (1 mg total) by mouth daily after breakfast. 12/08/18   [name], MD  lactulose (CHRONULAC) 10 GM/15ML solution Take 45 mLs (30 g total) by mouth 3 (three) times daily. Patient taking differently: Take 30 g by mouth 2 (two) times daily as needed for moderate constipation.  02/02/19   Harle Stanford., PA-C  magic mouthwash w/lidocaine SOLN Take 5 mLs by mouth 4 (four) times daily as needed for mouth pain. 08/22/18   Tanner, Lyndon Code., PA-C  Methylcobalamin (B-12) 5000 MCG TBDP Take 5,000 mcg by mouth daily.    [provider]  ondansetron (ZOFRAN) 8 MG tablet Take 1 tablet (8 mg total) by mouth every 8 (eight) hours as needed for nausea or vomiting. 12/22/18   Alla Feeling, NP  oxyCODONE (OXY IR/ROXICODONE) 5 MG immediate release tablet Take 5 mg by mouth every 4 (four) hours as needed for severe pain.    [provider]  polyethylene glycol (MIRALAX / GLYCOLAX) 17 g packet Take 17 g by mouth daily as needed for mild constipation.     [provider]  prochlorperazine (COMPAZINE) 10 MG tablet Take 1 tablet (10 mg total) by mouth every 6 (six) hours as needed (NAUSEA). 01/19/19   [name], MD  rivaroxaban (XARELTO) 20 MG TABS  tablet Take 1 tablet (20 mg total) by mouth daily after breakfast. 01/08/19   [name], MD  simethicone (MYLICON) 665 MG chewable tablet Chew 250 mg by mouth every 6 (six) hours as needed for flatulence.     [provider]  Syringe, Disposable, (10-12CC SYRINGE) 12 ML MISC 10 mLs by Does not apply route as needed. 12/04/18   Alla Feeling, NP  traMADol (ULTRAM) 50 MG tablet Take 50 mg by mouth every 6 (six) hours as needed (pain).  [provider]    Blood pressure 119/82, pulse 94, temperature 97.7 F (36.5 C), temperature source Oral, resp. rate 16, height 6' (1.829 m), weight 84.5 kg, SpO2 99 %. Physical Exam: General: pleasant white male who is laying in bed in NAD HEENT: head is normocephalic, atraumatic.  Sclera are noninjected.  Pupils equal and round.  Ears and nose without any masses or lesions.  Mouth is pink and moist. Dentition fair Heart: regular, rate, and rhythm.  No obvious murmurs, gallops, or rubs noted.  Palpable pedal pulses bilaterally Lungs: CTAB, no wheezes, rhonchi, or rales noted.  Respiratory effort nonlabored Abd: distended, firm, nontender, very few bowel sounds heard MS: no BLE edema, calves soft and nontender Skin: warm and dry with no masses, lesions, or rashes Psych: A&Ox3 with an appropriate affect. Neuro: cranial nerves grossly intact, extremity CSM intact bilaterally, normal speech  Results for orders placed or performed during the hospital encounter of 02/04/19 (from the past 48 hour(s))  CBC     Status: Abnormal   Collection Time: 02/08/19  8:16 AM  Result Value Ref Range   WBC 10.1 4.0 - 10.5 K/uL   RBC 4.15 (L) 4.22 - 5.81 MIL/uL   Hemoglobin 11.7 (L) 13.0 - 17.0 g/dL   HCT 35.5 (L) 39.0 - 52.0 %   MCV 85.5 80.0 - 100.0 fL   MCH 28.2 26.0 - 34.0 pg   MCHC 33.0 30.0 - 36.0 g/dL   RDW 16.2 (H) 11.5 - 15.5 %   Platelets 420 (H) 150 - 400 K/uL   nRBC 0.0 0.0 - 0.2 %    Comment: Performed at [facility], [address], [place], [place] [address]  Basic metabolic panel     Status: Abnormal   Collection Time: 02/08/19  8:16 AM  Result Value Ref Range   Sodium 135 135 - 145 mmol/L   Potassium 3.8 3.5 - 5.1 mmol/L   Chloride 102 98 - 111 mmol/L   CO2 24 22 - 32 mmol/L   Glucose, Bld 125 (H) 70 - 99 mg/dL   BUN 9 6 - 20 mg/dL   Creatinine, Ser 0.77 0.61 - 1.24 mg/dL   Calcium 8.3 (L) 8.9 - 10.3 mg/dL   GFR calc non Af Amer >60 >60 mL/min   GFR calc Af Amer >60 >60 mL/min   Anion gap 9 5 - 15    Comment: Performed at [facility], [place] [address] [place], [place] [address]  Basic metabolic panel     Status: Abnormal   Collection Time: 02/08/19 11:52 PM  Result Value Ref Range   Sodium 131 (L) 135 - 145 mmol/L   Potassium 3.9 3.5 - 5.1 mmol/L   Chloride 100 98 - 111 mmol/L   CO2 21 (L) 22 - 32 mmol/L   Glucose, Bld 139 (H) 70 - 99 mg/dL   BUN 11 6 - 20 mg/dL   Creatinine, Ser 0.82 0.61 - 1.24 mg/dL   Calcium 8.4 (L) 8.9 - 10.3 mg/dL   GFR calc non Af Amer >60 >60 mL/min   GFR calc Af Amer >60 >60 mL/min   Anion gap 10 5 - 15    Comment: Performed at [facility], [place] [address], [place] [address]  CBC     Status: Abnormal   Collection Time: 02/08/19 11:52 PM  Result Value Ref Range   WBC 11.1 (H) 4.0 - 10.5 K/uL   RBC 4.42 4.22 - 5.81 MIL/uL  Hemoglobin 12.4 (L) 13.0 - 17.0 g/dL   HCT 37.3 (L) 39.0 - 52.0 %   MCV 84.4 80.0 - 100.0 fL   MCH 28.1 26.0 - 34.0 pg   MCHC 33.2 30.0 - 36.0 g/dL   RDW 15.9 (H) 11.5 - 15.5 %   Platelets 463 (H) 150 - 400 K/uL   nRBC 0.0 0.0 - 0.2 %    Comment: Performed at [facility] [address], [place], [place] [address]  Protime-INR     Status: None   Collection Time: 02/08/19 11:52 PM  Result Value Ref Range   Prothrombin Time 14.2 11.4 - 15.2 seconds   INR 1.1 0.8 - 1.2    Comment: (NOTE) INR goal varies based on device and disease states. Performed at [facility], [place] [address], [place],  [address]    Dg  Abd Portable 1v  Result Date: 02/09/2019 CLINICAL DATA:  Abdominal distention. EXAM: PORTABLE ABDOMEN - 1 VIEW COMPARISON:  02/08/2019. FINDINGS: Surgical clips right upper quadrant. Biliary stent and rectosigmoid stent noted in stable position. Stool in the colon. Slightly distended loops of small and large bowel are noted most consistent with an adynamic ileus. Follow-up exam suggested in order to demonstrate resolution and to exclude bowel obstruction. No free air. Degenerative changes lumbar spine and both hips. Pelvic calcifications consistent phleboliths. IMPRESSION: Biliary stent and rectosigmoid stent noted in stable position. Slightly distended loops of small and large bowel are noted most consistent adynamic ileus. Follow-up exam suggested in order to demonstrate resolution and to exclude bowel obstruction. Electronically Signed   By: Marcello Moores  Register   On: 02/09/2019 07:20   Dg Abd Portable 1v  Result Date: 02/08/2019 CLINICAL DATA:  Abdominal distension EXAM: PORTABLE ABDOMEN - 1 VIEW COMPARISON:  Multiple previous films.  The most recent is 02/07/2019 FINDINGS: The NG tube is not visualized and was likely removed. It could have also been pulled back into the esophagus. Recommend clinical correlation. The biliary stent and rectosigmoid stents are stable. There is less contrast in the right colon. Some persistent air distended transverse colon. Scattered air-filled small bowel loops but no distension. No definite free air. IMPRESSION: Some interval passage of colonic contrast. No findings for small bowel obstruction or free air. The NG tube is no longer visualized. Electronically Signed   By: Marijo Sanes M.D.   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 He underwent successful colonic stent placement 7/3 but continues to appear distended and have limited bowel function. Some of his distension may be from malignant ascites, therefore will ask IR to consider repeat paracentesis. He may also have somewhat of an ileus. Add magnesium citrate to bowel regimen. He had an enema today, may need to add daily suppositories tomorrow. Monitor electrolytes. Repeat abdominal film tomorrow. Hold on NG tube placement as patient is not active vomiting. I discussed with the primary team who will place consults to IR and palliative care. I attempted  to call and and update the patient's wife, no answer, will try again later.  ID - none currently VTE - SCDs, lovenox FEN - IVF, CLD Foley - none Follow up - TBD Contact - wife Jeydan Barner [place], [facility] Surgery 02/09/2019, 2:13 PM Pager: [phone] Mon-Thurs 7:00 am-4:30 pm Fri 7:00 am -11:30 AM Sat-Sun 7:00 am-11:30 am

## 2019-02-09 NOTE — Progress Notes (Signed)
Subjective: No bowel movement for couple weeks. No flatus for several days. Has significant distention at baseline and worse any times he tries to drink. Nausea but no vomiting.  Objective: Vital signs in last 24 hours: Temp:  [97.5 F (36.4 C)-98.1 F (36.7 C)] 97.7 F (36.5 C) (07/06 0911) Pulse Rate:  [92-115] 94 (07/06 0911) Resp:  [14-16] 16 (07/06 0911) BP: (114-132)/(82-103) 119/82 (07/06 0911) SpO2:  [98 %-100 %] 99 % (07/06 0911) Weight change:  Last BM Date: 02/07/19  PE: GEN:  Cachectic, older-appearing than stated age, NAD ABD: Tight distended, mild tenderness, tympanic  Lab Results: CBC    Component Value Date/Time   WBC 11.1 (H) 02/08/2019 2352   RBC 4.42 02/08/2019 2352   HGB 12.4 (L) 02/08/2019 2352   HGB 11.2 (L) 01/26/2019 1020   HGB 10.5 (L) 11/19/2018 0236   HCT 37.3 (L) 02/08/2019 2352   PLT 463 (H) 02/08/2019 2352   PLT 319 01/26/2019 1020   MCV 84.4 02/08/2019 2352   MCH 28.1 02/08/2019 2352   MCHC 33.2 02/08/2019 2352   RDW 15.9 (H) 02/08/2019 2352   LYMPHSABS 1.0 02/04/2019 2212   MONOABS 1.5 (H) 02/04/2019 2212   EOSABS 0.0 02/04/2019 2212   BASOSABS 0.0 02/04/2019 2212   CMP     Component Value Date/Time   NA 131 (L) 02/08/2019 2352   K 3.9 02/08/2019 2352   CL 100 02/08/2019 2352   CO2 21 (L) 02/08/2019 2352   GLUCOSE 139 (H) 02/08/2019 2352   BUN 11 02/08/2019 2352   CREATININE 0.82 02/08/2019 2352   CREATININE 0.79 01/26/2019 1020   CALCIUM 8.4 (L) 02/08/2019 2352   PROT 5.3 (L) 02/05/2019 0515   ALBUMIN 2.3 (L) 02/05/2019 0515   AST 25 02/05/2019 0515   AST 36 01/26/2019 1020   ALT 35 02/05/2019 0515   ALT 76 (H) 01/26/2019 1020   ALKPHOS 93 02/05/2019 0515   BILITOT 0.5 02/05/2019 0515   BILITOT <0.2 (L) 01/26/2019 1020   GFRNONAA >60 02/08/2019 2352   GFRNONAA >60 01/26/2019 1020   GFRAA >60 02/08/2019 2352   GFRAA >60 01/26/2019 1020   ABD xray: personally reviewed; mild increase in distention of colon    Assessment:  1.  Metastatic pancreatic cancer with carcinomatosis and suspected peritoneal implants. 2.  Colonic obstruction at rectosigmoid likely from #1 above. Colonic stent placed 02/05/2009. 3.  Subclavian vein thrombosis.  Plan:  1.  While abdominal xray looks a bit better, his clinical exam and course have not improved. 2.  As he has not had flatus or bowel movement three days since his colonic stent was placed, I am going to obtain a surgical consult to evaluate.  I suspect they will order to replace NGT and repeat CT scan to ensure no other source of bowel obstruction previously not seen, but will defer to judgement of surgical team.  Given lack of improvement, there is concern he may ultimately need palliative diverting colostomy. 3.  Sips clear liquids only for now. 4.  [facility] will follow.   Landry Dyke 02/09/2019, 12:29 PM   Cell [phone] If no answer or after 5 PM call [phone]

## 2019-02-09 NOTE — Progress Notes (Signed)
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  NGT placed.  Patient underwent colonoscopy with colonic stent/prosthesis to distal sigmoid colon on 02/06/2019 by Dr. Watt Climes.   Patient and wife are decided to go with palliative/hospice care post discharge.  No further therapeutic chemotherapy per oncology, Dr. 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 Last chemo on 01/19/2019-previously did not tolerate-no further therapeutic chemo per oncology, Dr. Burr Medico -Colonoscopy with colonic stent/prosthesis placement by Dr. Watt Climes on 02/06/2019. NG tube came out on 02/08/2019. Cont PPI, scheduled MiraLAX, daily enema, clear liquid diet. Per GI: Patient may need diverting colostomy if no improvement. appreciate GI recs   Recurrent malignant ascites due to metastatic pancreatic cancer. Apparently, Pt was cheduled for peritoneal catheter placement on 02/09/2019 with IR outpatient. thought   very risky at this time due to bowel distention/obstruction. Will need to reschedule when obstruction resolves   Chronic anticoagulation: Reports history of left upper extremity/shoulder DVT although upper extremity venous Doppler did not show thrombus in 09/08/2018.  No history of A. Fib. Obviously high risk for DVT due to malignancy. Resume Xarelto if no procedure is scheduled. Continue prophylactic Lovenox for now    Tobacco use disorder. Encourage cessation and provided nicotine patch  Anxiety/insomnia. Continue Ativan. Added nightly Ambien.  Social/Ethics: Patient is now DNR planning to enroll in hospice with [employer].  No further chemo.  Code Status: DNR  Family Communication: d/w patient, RN (indicate person spoken with, relationship, and if by phone, the number) Disposition Plan: home soon   Consultants:  GI  Palliative    Oncology   Procedures: 02/06/2019: Colonoscopy with colonic stent/prostatitis   Antibiotics: Anti-infectives (From admission, onward)   Start     Dose/Rate Route Frequency Ordered Stop   02/09/19 0600  ceFAZolin (ANCEF) IVPB 2g/100 mL premix     2 g 200 mL/hr over 30 Minutes Intravenous To Radiology 02/08/19 0753 02/10/19 0600        (indicate start date, and stop date if known)  HPI/Subjective: Reports abdominal distention. No vomiting. + flatus. Repeat KUB+ ileus. He wants to try to eat food   Objective: Vitals:   02/09/19 0512 02/09/19 0911  BP: (!) 127/95 119/82  Pulse: 92 94  Resp: 14 16  Temp: (!) 97.5 F (36.4 C) 97.7 F (36.5 C)  SpO2: 99% 99%    Intake/Output Summary (Last 24 hours) at 02/09/2019 1118 Last data filed at 02/09/2019 0636 Gross per 24 hour  Intake 805 ml  Output -  Net 805 ml   Filed Weights   02/04/19 2254 02/06/19 0733  Weight: 84.5 kg 84.5 kg    Exam:   General:  No distress   Cardiovascular: s1,s2 rrr  Respiratory: CTA BL  Abdomen: tense, distended, NT+bs  Musculoskeletal: no leg edema    Data Reviewed: Basic Metabolic Panel: Recent Labs  Lab 02/04/19 2212 02/05/19 0515 02/07/19 0414 02/08/19 0816 02/08/19 2352  NA 130* 131* 134* 135 131*  K 4.4 4.2 4.1 3.8 3.9  CL 92* 95* 99 102 100  CO2 26 26 25 24  21*  GLUCOSE 127* 138* 122* 125* 139*  BUN 7 8 8 9 11   CREATININE 0.81 0.73 0.86 0.77 0.82  CALCIUM 8.8* 8.1* 8.6* 8.3* 8.4*   Liver Function Tests: Recent Labs  Lab  02/04/19 2212 02/05/19 0515  AST 29 25  ALT 41 35  ALKPHOS 100 93  BILITOT 0.8 0.5  PROT 6.1* 5.3*  ALBUMIN 2.8* 2.3*   No results for input(s): LIPASE, AMYLASE in the last 168 hours. No results for input(s): AMMONIA in the last 168 hours. CBC: Recent Labs  Lab 02/04/19 2212 02/05/19 0515 02/07/19 0414 02/08/19 0816 02/08/19 2352  WBC 12.4* 9.0 10.2 10.1 11.1*  NEUTROABS 9.7*  --   --   --   --   HGB 12.9* 11.3* 12.4* 11.7* 12.4*  HCT 39.2 34.0* 38.6* 35.5* 37.3*  MCV 84.3 84.2 86.4 85.5 84.4  PLT 467* 419* 499* 420* 463*   Cardiac Enzymes: No results for input(s): CKTOTAL, CKMB, CKMBINDEX, TROPONINI in the last 168 hours. BNP (last 3 results) Recent Labs    11/18/18 2248  BNP 106.2*    ProBNP (last 3 results) No results for input(s): PROBNP in the last 8760 hours.  CBG: No results for input(s): GLUCAP in the last 168 hours.  Recent Results (from the past 240 hour(s))  SARS Coronavirus 2 (CEPHEID - Performed in [place] hospital lab), Hosp Order     Status: None   Collection Time: 02/04/19 11:32 PM   Specimen: Nasopharyngeal Swab  Result Value Ref Range Status   SARS Coronavirus 2 NEGATIVE NEGATIVE Final    Comment: (NOTE) If result is NEGATIVE SARS-CoV-2 target nucleic acids are NOT DETECTED. The SARS-CoV-2 RNA is generally detectable in upper and lower  respiratory specimens during the acute phase of infection. The lowest  concentration of SARS-CoV-2 viral copies this assay can detect is 250  copies / mL. A negative result does not preclude SARS-CoV-2 infection  and should not be used as the sole basis for treatment or other  patient management decisions.  A negative result may occur with  improper specimen collection / handling, submission of specimen other  than nasopharyngeal swab, presence of viral mutation(s) within the  areas targeted by this assay, and inadequate number of viral copies  (<250 copies / mL). A negative result must be combined with  clinical  observations, patient history, and epidemiological information. If result is POSITIVE SARS-CoV-2 target nucleic acids are DETECTED. The SARS-CoV-2 RNA is generally detectable in upper and lower  respiratory specimens dur ing the acute phase of infection.  Positive  results are indicative of active infection with SARS-CoV-2.  Clinical  correlation with patient history and other diagnostic information is  necessary to determine patient infection status.  Positive results do  not rule out bacterial infection or co-infection with other viruses. If result is PRESUMPTIVE POSTIVE SARS-CoV-2 nucleic acids MAY BE PRESENT.   A presumptive positive result was obtained on the submitted specimen  and confirmed on repeat testing.  While 2019 novel coronavirus  (SARS-CoV-2) nucleic acids may be present in the submitted sample  additional confirmatory testing may be necessary for epidemiological  and / or clinical management purposes  to differentiate between  SARS-CoV-2 and other Sarbecovirus currently known to infect humans.  If clinically indicated additional testing with an alternate test  methodology [phone]) is advised. The SARS-CoV-2 RNA is generally  detectable in upper and lower respiratory sp ecimens during the acute  phase of infection. The  expected result is Negative. Fact Sheet for Patients:  StrictlyIdeas.no Fact Sheet for Healthcare Providers: BankingDealers.co.za This test is not yet approved or cleared by the [place] FDA and has been authorized for detection and/or diagnosis of SARS-CoV-2 by FDA under an Emergency Use Authorization (EUA).  This EUA will remain in effect (meaning this test can be used) for the duration of the COVID-19 declaration under Section 564(b)(1) of the Act, 21 U.S.C. section 360bbb-3(b)(1), unless the authorization is terminated or revoked sooner. Performed at [facility], [place]  [address] [place], [place] [address]      Studies: Dg Abd Portable 1v  Result Date: 02/09/2019 CLINICAL DATA:  Abdominal distention. EXAM: PORTABLE ABDOMEN - 1 VIEW COMPARISON:  02/08/2019. FINDINGS: Surgical clips right upper quadrant. Biliary stent and rectosigmoid stent noted in stable position. Stool in the colon. Slightly distended loops of small and large bowel are noted most consistent with an adynamic ileus. Follow-up exam suggested in order to demonstrate resolution and to exclude bowel obstruction. No free air. Degenerative changes lumbar spine and both hips. Pelvic calcifications consistent phleboliths. IMPRESSION: Biliary stent and rectosigmoid stent noted in stable position. Slightly distended loops of small and large bowel are noted most consistent adynamic ileus. Follow-up exam suggested in order to demonstrate resolution and to exclude bowel obstruction. Electronically Signed   By: Marcello Moores  Register   On: 02/09/2019 07:20   Dg Abd Portable 1v  Result Date: 02/08/2019 CLINICAL DATA:  Abdominal distension EXAM: PORTABLE ABDOMEN - 1 VIEW COMPARISON:  Multiple previous films.  The most recent is 02/07/2019 FINDINGS: The NG tube is not visualized and was likely removed. It could have also been pulled back into the esophagus. Recommend clinical correlation. The biliary stent and rectosigmoid stents are stable. There is less contrast in the right colon. Some persistent air distended transverse colon. Scattered air-filled small bowel loops but no distension. No definite free air. IMPRESSION: Some interval passage of colonic contrast. No findings for small bowel obstruction or free air. The NG tube is no longer visualized. Electronically Signed   By: Marijo Sanes M.D.   On: 02/08/2019 09:01    Scheduled Meds: . diatrizoate meglumine-sodium  30 mL Oral Once  . enoxaparin (LOVENOX) injection  40 mg Subcutaneous Daily  . nicotine  21 mg Transdermal Once  . nicotine  21 mg Transdermal Daily  .  pantoprazole (PROTONIX) IV  40 mg Intravenous Q24H  . polyethylene glycol  17 g Oral BID  . sodium chloride flush  10-40 mL Intracatheter Q12H  . sodium phosphate  1 enema Rectal Daily   Continuous Infusions: . sodium chloride 10 mL/hr (02/09/19 0000)  .  ceFAZolin (ANCEF) IV    . dextrose 5 % and 0.9% NaCl 75 mL/hr at 02/07/19 0200    Principal Problem:   Intestinal obstruction (HCC) Active Problems:   Jaundice   Tobacco use   Adenocarcinoma of pancreas (HCC)   Nausea & vomiting    Time spent: >35 minutes     Kinnie Feil  [facility] Pager [phone]. If 7PM-7AM, please contact night-coverage at [email or web address], password [facility] 02/09/2019, 11:18 AM  LOS: 5 days

## 2019-02-09 NOTE — Progress Notes (Signed)
Aware of [facility] request for possible image-guided paracentesis/tunneled peritoneal catheter placement.  Patient was originally scheduled for an image-guided tunneled peritoneal catheter placement as OP this AM but was unfortunately admitted to [facility] 02/04/2019 for management of presumed bowel obstruction.  Patient has been seen/examined by [facility]. Discussed case with Dr. Kae Heller who would prefer tunneled peritoneal catheter placement. Discussed case with Dr. Anselm Pancoast who approves procedure. Plan for image-guided tunneled peritoneal catheter placement tentatively for 02/10/2019 in IR. Patient will be NPO at midnight. Hold Lovenox at midnight. INR 1.1. COVID pending. Can consent self and will consent in IR prior to procedure. 5N RN aware.  Please call IR with questions/concerns.   Bea Graff Louk, PA-C 02/09/2019, 5:08 PM

## 2019-02-10 ENCOUNTER — Inpatient Hospital Stay (HOSPITAL_COMMUNITY)
Admit: 2019-02-10 | Discharge: 2019-02-10 | Disposition: A | Discharge status: Home / Self Care | Payer: Managed Care, Other (non HMO) | Attending: Hematology | Admitting: Hematology

## 2019-02-10 ENCOUNTER — Inpatient Hospital Stay (HOSPITAL_COMMUNITY): Payer: Managed Care, Other (non HMO)

## 2019-02-10 ENCOUNTER — Encounter (HOSPITAL_COMMUNITY): Payer: Self-pay | Admitting: Diagnostic Radiology

## 2019-02-10 DIAGNOSIS — R180 Malignant ascites: Secondary | ICD-10-CM

## 2019-02-10 DIAGNOSIS — Z7189 Other specified counseling: Secondary | ICD-10-CM

## 2019-02-10 DIAGNOSIS — Z66 Do not resuscitate: Secondary | ICD-10-CM

## 2019-02-10 DIAGNOSIS — K5900 Constipation, unspecified: Secondary | ICD-10-CM

## 2019-02-10 DIAGNOSIS — R140 Abdominal distension (gaseous): Secondary | ICD-10-CM

## 2019-02-10 DIAGNOSIS — C259 Malignant neoplasm of pancreas, unspecified: Secondary | ICD-10-CM

## 2019-02-10 DIAGNOSIS — Z515 Encounter for palliative care: Secondary | ICD-10-CM

## 2019-02-10 HISTORY — PX: IR PERC TUN PERIT CATH WO PORT S&I /IMAG: IMG2327

## 2019-02-10 LAB — CBC
HCT: 36.6 % — ABNORMAL LOW (ref 39.0–52.0)
Hemoglobin: 11.9 g/dL — ABNORMAL LOW (ref 13.0–17.0)
MCH: 27.8 pg (ref 26.0–34.0)
MCHC: 32.5 g/dL (ref 30.0–36.0)
MCV: 85.5 fL (ref 80.0–100.0)
Platelets: 414 10*3/uL — ABNORMAL HIGH (ref 150–400)
RBC: 4.28 MIL/uL (ref 4.22–5.81)
RDW: 16.1 % — ABNORMAL HIGH (ref 11.5–15.5)
WBC: 10.3 10*3/uL (ref 4.0–10.5)
nRBC: 0 % (ref 0.0–0.2)

## 2019-02-10 LAB — BASIC METABOLIC PANEL
Anion gap: 10 (ref 5–15)
BUN: 14 mg/dL (ref 6–20)
CO2: 25 mmol/L (ref 22–32)
Calcium: 8.8 mg/dL — ABNORMAL LOW (ref 8.9–10.3)
Chloride: 99 mmol/L (ref 98–111)
Creatinine, Ser: 0.83 mg/dL (ref 0.61–1.24)
GFR calc Af Amer: >60 mL/min (ref >60)
GFR calc non Af Amer: >60 mL/min (ref >60)
Glucose, Bld: 115 mg/dL — ABNORMAL HIGH (ref 70–99)
Potassium: 4.6 mmol/L (ref 3.5–5.1)
Sodium: 134 mmol/L — ABNORMAL LOW (ref 135–145)

## 2019-02-10 LAB — MAGNESIUM: Magnesium: 2.4 mg/dL (ref 1.7–2.4)

## 2019-02-10 MED ORDER — CEFAZOLIN SODIUM-DEXTROSE 2-4 GM/100ML-% IV SOLN
INTRAVENOUS | Status: AC
  Filled 2019-02-10: qty 100

## 2019-02-10 MED ORDER — FENTANYL CITRATE (PF) 100 MCG/2ML IJ SOLN
INTRAMUSCULAR | Status: AC
  Filled 2019-02-10: qty 2

## 2019-02-10 MED ORDER — CEFAZOLIN SODIUM-DEXTROSE 2-4 GM/100ML-% IV SOLN: 2.0000 g | INTRAVENOUS | Status: AC

## 2019-02-10 MED ORDER — FENTANYL CITRATE (PF) 100 MCG/2ML IJ SOLN
INTRAMUSCULAR | Status: AC | PRN
  Administered 2019-02-10: 50 ug via INTRAVENOUS

## 2019-02-10 MED ORDER — SODIUM CHLORIDE 0.9 % IV SOLN
INTRAVENOUS | Status: AC | PRN
  Administered 2019-02-10: 10 mL/h via INTRAVENOUS

## 2019-02-10 MED ORDER — CEFAZOLIN SODIUM-DEXTROSE 2-4 GM/100ML-% IV SOLN
INTRAVENOUS | Status: AC | PRN
  Administered 2019-02-10: 2 g via INTRAVENOUS

## 2019-02-10 MED ORDER — BISACODYL 10 MG RE SUPP
10.0000 mg | Freq: Once | RECTAL | Status: DC
  Filled 2019-02-10: qty 1

## 2019-02-10 MED ORDER — MIDAZOLAM HCL 2 MG/2ML IJ SOLN
INTRAMUSCULAR | Status: AC | PRN
  Administered 2019-02-10 (×2): 1 mg via INTRAVENOUS

## 2019-02-10 MED ORDER — LIDOCAINE HCL (PF) 1 % IJ SOLN
INTRAMUSCULAR | Status: AC | PRN
  Administered 2019-02-10: 10 mL

## 2019-02-10 MED ORDER — MIDAZOLAM HCL 2 MG/2ML IJ SOLN
INTRAMUSCULAR | Status: AC
  Filled 2019-02-10: qty 2

## 2019-02-10 NOTE — Progress Notes (Signed)
[facility] Progress Note  4 Days Post-Op  Subjective: CC-  Continues to feel bloated. Some nausea over night, no emesis. No flatus or BM.  Tolerated some liquids yesterday, but always felt very full after a few sips. NPO today for tunneled peritoneal pleurX catheter placement. Xray looks about the same with mildly dilated bowel, air and stool in the colon, ascites.  Objective: Vital signs in last 24 hours: Temp:  [97.4 F (36.3 C)-98.5 F (36.9 C)] 98.5 F (36.9 C) (07/07 0341) Pulse Rate:  [91-113] 91 (07/07 0341) Resp:  [16] 16 (07/07 0341) BP: (116-133)/(82-100) 125/92 (07/07 0341) SpO2:  [99 %-100 %] 100 % (07/07 0341) Last BM Date: 02/07/19  Intake/Output from previous day: 07/06 0701 - 07/07 0700 In: 240 [P.O.:240] Out: -  Intake/Output this shift: No intake/output data recorded.  PE: Gen:  Alert, NAD, pleasant HEENT: EOM's intact, pupils equal and round effort normal Abd: distended, firm, nontender, few BS heard Ext:  No BLE edema Psych: A&Ox3  Skin: no rashes noted, warm and dry  Lab Results:  Recent Labs    02/08/19 2352 02/10/19 0401  WBC 11.1* 10.3  HGB 12.4* 11.9*  HCT 37.3* 36.6*  PLT 463* 414*   BMET Recent Labs    02/08/19 2352 02/10/19 0401  NA 131* 134*  K 3.9 4.6  CL 100 99  CO2 21* 25  GLUCOSE 139* 115*  BUN 11 14  CREATININE 0.82 0.83  CALCIUM 8.4* 8.8*   PT/INR Recent Labs    02/08/19 2352  LABPROT 14.2  INR 1.1   CMP     Component Value Date/Time   NA 134 (L) 02/10/2019 0401   K 4.6 02/10/2019 0401   CL 99 02/10/2019 0401   CO2 25 02/10/2019 0401   GLUCOSE 115 (H) 02/10/2019 0401   BUN 14 02/10/2019 0401   CREATININE 0.83 02/10/2019 0401   CREATININE 0.79 01/26/2019 1020   CALCIUM 8.8 (L) 02/10/2019 0401   PROT 5.3 (L) 02/05/2019 0515   ALBUMIN 2.3 (L) 02/05/2019 0515   AST 25 02/05/2019 0515   AST 36 01/26/2019 1020   ALT 35 02/05/2019 0515   ALT 76 (H) 01/26/2019 1020   ALKPHOS 93 02/05/2019  0515   BILITOT 0.5 02/05/2019 0515   BILITOT <0.2 (L) 01/26/2019 1020   GFRNONAA >60 02/10/2019 0401   GFRNONAA >60 01/26/2019 1020   GFRAA >60 02/10/2019 0401   GFRAA >60 01/26/2019 1020   Lipase     Component Value Date/Time   LIPASE 19 02/01/2019 1320       Studies/Results: Dg Abd Portable 1v  Result Date: 02/09/2019 CLINICAL DATA:  Abdominal distention. EXAM: PORTABLE ABDOMEN - 1 VIEW COMPARISON:  02/08/2019. FINDINGS: Surgical clips right upper quadrant. Biliary stent and rectosigmoid stent noted in stable position. Stool in the colon. Slightly distended loops of small and large bowel are noted most consistent with an adynamic ileus. Follow-up exam suggested in order to demonstrate resolution and to exclude bowel obstruction. No free air. Degenerative changes lumbar spine and both hips. Pelvic calcifications consistent phleboliths. IMPRESSION: Biliary stent and rectosigmoid stent noted in stable position. Slightly distended loops of small and large bowel are noted most consistent adynamic ileus. Follow-up exam suggested in order to demonstrate resolution and to exclude bowel obstruction. Electronically Signed   By: Marcello Moores  Register   On: 02/09/2019 07:20   Dg Abd Portable 1v  Result Date: 02/08/2019 CLINICAL DATA:  Abdominal distension EXAM: PORTABLE ABDOMEN - 1 VIEW COMPARISON:  Multiple  previous films.  The most recent is 02/07/2019 FINDINGS: The NG tube is not visualized and was likely removed. It could have also been pulled back into the esophagus. Recommend clinical correlation. The biliary stent and rectosigmoid stents are stable. There is less contrast in the right colon. Some persistent air distended transverse colon. Scattered air-filled small bowel loops but no distension. No definite free air. IMPRESSION: Some interval passage of colonic contrast. No findings for small bowel obstruction or free air. The NG tube is no longer visualized. Electronically Signed   By: Marijo Sanes  M.D.   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 This should help with some of the pressure he is feeling in his abdomen.  Dulcolax suppository today. Palliative care consult pending for ongoing goals of care discussions.  ID - none currently VTE - SCDs, lovenox FEN - IVF, NPO for procedure Foley - none Follow up - TBD Contact - wife Lowery Paullin [phone]   LOS: 6 days    Wellington [place] , [facility] 02/10/2019, 8:11 AM Pager: [phone] Mon-Thurs 7:00 am-4:30 pm Fri 7:00 am -11:30 AM Sat-Sun 7:00 am-11:30 am

## 2019-02-10 NOTE — Progress Notes (Signed)
Surgical consult appreciated.  Defer to them regarding further imaging or testing.  Hopefully patient will get some degree of relief from paracentesis.  [facility] will follow along at a distance, please let us know if there's anything we can do to be of help in the meantime.

## 2019-02-10 NOTE — Plan of Care (Signed)

## 2019-02-10 NOTE — Consult Note (Signed)
Consultation Note Date: 02/10/2019   Patient Name: Ernest Wu  DOB: 1960/11/14  MRN: [id number]  Age / Sex: 58 y.o., male   PCP: Street, [name], MD Referring Physician: Kinnie Feil, MD   REASON FOR CONSULTATION:Establishing goals of care  Palliative Care consult requested for this 58 y.o. male with multiple medical problems including GERD, tobacco use, cholecystectomy s/p biliary stent, metastatic pancreatic cancer stage IB s/p chemotherapy (last dose 01/19/2019) discontinued due to poor tolerance. He presented with abdominal pain, nausea, and vomiting. CT showed colonic obstruction with cecal dilation and malignant ascites. He underwent a colonoscopy with colonic stent to distal sigmoid on 02/06/2019. Pleurx catheter placed 02/10/2019 on left side s/p 4.8L of yellow fluid.   Clinical Assessment and Goals of Care: I have reviewed medical records including lab results, imaging, Epic notes, and MAR, received report from the bedside RN, and assessed the patient. I met at the bedside with patient  to discuss diagnosis prognosis, GOC, EOL wishes, disposition and options. Mr. Pitstick was asleep but easily aroused. Denied pain or shortness of breath. States he feels so much better since paracentesis.   I introduced Palliative Medicine as specialized medical care for people living with serious illness. It focuses on providing relief from the symptoms and stress of a serious illness. The goal is to improve quality of life for both the patient and the family.  We discussed a brief life review of the patient, along with his functional and nutritional status. Patient reports he lives with his wife of 25 years. They have 1 daughter. 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 He states he enjoys spending time with family and being outside.   Prior to admission he reports performing all ADLs independently without use of assistive devices. He reports increased shortness of breath, fatigue, and  weakness causing him to take frequent breaks. His appetite was "ok". States depending on how he felt would depend on how much he ate.   We discussed His current illness and what it means in the larger context of His on-going co-morbidities. With specific discussions regarding his metastatic pancreatic cancer, ascites, and overall functional and nutritional state. Natural disease trajectory and expectations at EOL were discussed. Patient verbalized awareness of his terminal condition and states "I was hopeful, but somewhat worried it would all turn out this way. I have heard stories of people with pancreatic cancer and I am thankful to be here this long!" Support provided.   He states he feels much better s/p paracentesis and is comfortable knowing he now has the Pleurx which can assist him in not suffering once the fluid builds back up. Support given.   I attempted to elicit values and goals of care important to the patient.    The difference between aggressive medical intervention and comfort care was considered in light of the patient's goals of care. Patient shares that he and his wife have been in conversation and they have agreed to have hospice for support once he returns home. He verbalizes his understanding of their goals and philosophy. All questions answered. He is hopeful to discharge home tomorrow and be with his family.   Mr. Millea confirms wishes for DNR/DNI. He states his wife, Darald Uzzle is his medical decision maker in the event one is needed.   Questions and concerns were addressed.  The family was encouraged to call with questions or concerns.  PMT will continue to support holistically.   SOCIAL HISTORY:  reports that he quit smoking 8 days ago. His smoking use included cigarettes. He has a 7.50 pack-year smoking history. He has never used smokeless tobacco. He reports current alcohol use. He reports that he does not use drugs.  CODE STATUS: DNR  ADVANCE DIRECTIVES: NEXT OF  KIN    SYMPTOM MANAGEMENT: per attending   Palliative Prophylaxis:   Bowel Regimen and Frequent Pain Assessment  PSYCHO-SOCIAL/SPIRITUAL:  Support System: Family   Desire for further Chaplaincy support:NO   Additional Recommendations (Limitations, Scope, Preferences):  Home with hospice support, DNR/dNI   PAST MEDICAL HISTORY: Past Medical History:  Diagnosis Date  . Anxiety   . Family history of breast cancer   . Family history of colonic polyps   . Family history of pancreatic cancer   . Family history of prostate cancer   . GERD (gastroesophageal reflux disease)   . Intestinal obstruction ([place]) 02/05/2019  . pancreatic ca 06/2018   Pancreatic    PAST SURGICAL HISTORY:  Past Surgical History:  Procedure Laterality Date  . BILIARY STENT PLACEMENT  06/10/2018   Procedure: BILIARY STENT PLACEMENT;  Surgeon: Ronnette Juniper, MD;  Location: [facility] ENDOSCOPY;  Service: Gastroenterology;;  . CHOLECYSTECTOMY N/A 11/10/2018   Procedure: Laparoscopic Cholecystectomy;  Surgeon: Stark Klein, MD;  Location: [place];  Service: General;  Laterality: N/A;  . COLONIC STENT PLACEMENT N/A 02/06/2019   Procedure: COLONIC STENT PLACEMENT;  Surgeon: Clarene Essex, MD;  Location: [place];  Service: Endoscopy;  Laterality: N/A;  . COLONOSCOPY WITH PROPOFOL N/A 02/06/2019   Procedure: COLONOSCOPY WITH PROPOFOL for  colonic stent placement;  Surgeon: Clarene Essex, MD;  Location: [place];  Service: Endoscopy;  Laterality: N/A;  . DIAGNOSTIC LAPAROSCOPIC LIVER BIOPSY N/A 11/10/2018   Procedure: Diagnostic Laparoscopic Liver Biopsy;  Surgeon: Stark Klein, MD;  Location: [place];  Service: General;  Laterality: N/A;  . ERCP N/A 06/10/2018   Procedure: ENDOSCOPIC RETROGRADE CHOLANGIOPANCREATOGRAPHY (ERCP);  Surgeon: Ronnette Juniper, MD;  Location: [place];  Service: Gastroenterology;  Laterality: N/A;  . ESOPHAGOGASTRODUODENOSCOPY (EGD) WITH PROPOFOL N/A 06/10/2018   Procedure: ESOPHAGOGASTRODUODENOSCOPY  (EGD) WITH PROPOFOL;  Surgeon: Ronnette Juniper, MD;  Location: [place];  Service: Gastroenterology;  Laterality: N/A;  . FINE NEEDLE ASPIRATION  06/10/2018   Procedure: FINE NEEDLE ASPIRATION (FNA) LINEAR;  Surgeon: Ronnette Juniper, MD;  Location: [place];  Service: Gastroenterology;;  . IR RADIOLOGIST EVAL & MGMT  12/10/2018  . PORT-A-CATH REMOVAL N/A 11/10/2018   Procedure: REMOVAL PORT-A-CATH;  Surgeon: Stark Klein, MD;  Location: [place];  Service: General;  Laterality: N/A;  . PORTACATH PLACEMENT N/A 07/02/2018   Procedure: INSERTION PORT-A-CATH;  Surgeon: Stark Klein, MD;  Location: [place];  Service: General;  Laterality: N/A;  . SPHINCTEROTOMY  06/10/2018   Procedure: SPHINCTEROTOMY;  Surgeon: Ronnette Juniper, MD;  Location[facility];  Service: Gastroenterology;;  . UPPER ESOPHAGEAL ENDOSCOPIC ULTRASOUND (EUS) N/A 06/10/2018   Procedure: UPPER ESOPHAGEAL ENDOSCOPIC ULTRASOUND (EUS);  Surgeon: Ronnette Juniper, MD;  Location: [place];  Service: Gastroenterology;  Laterality: N/A;  . VASECTOMY      ALLERGIES:  is allergic to contrast media [iodinated diagnostic agents] and iohexol.   MEDICATIONS:  Current Facility-Administered Medications  Medication Dose Route Frequency Provider Last Rate Last Dose  . 0.9 %  sodium chloride infusion   Intravenous Continuous Monia Sabal, PA-C 10 mL/hr at 02/09/19 0000 10 mL/hr at 02/09/19 0000  . bisacodyl (DULCOLAX) suppository 10 mg  10 mg Rectal Once Meuth, Brooke A, PA-C      .  ceFAZolin (ANCEF) IVPB 2g/100 mL premix  2 g Intravenous to XRAY Monia Sabal, PA-C      . dextrose 5 %-0.9 % sodium chloride infusion   Intravenous Continuous Clarene Essex, MD 75 mL/hr at 02/07/19 0200    . diatrizoate meglumine-sodium (GASTROGRAFIN) 66-10 % solution 30 mL  30 mL Oral Once Clarene Essex, MD      . LORazepam (ATIVAN) injection 1 mg  1 mg Intravenous Q6H PRN Mercy Riding, MD   1 mg at 02/08/19 2338  . morphine 4 MG/ML injection 4 mg  4 mg Intravenous Q2H PRN  Roxan Hockey, MD   4 mg at 02/10/19 0252  . nicotine (NICODERM CQ - dosed in mg/24 hours) patch 21 mg  21 mg Transdermal Once Blount, Scarlette Shorts T, NP      . nicotine (NICODERM CQ - dosed in mg/24 hours) patch 21 mg  21 mg Transdermal Daily Gonfa, Taye T, MD      . ondansetron (ZOFRAN) tablet 4 mg  4 mg Oral Q4H PRN Emokpae, Courage, MD       Or  . ondansetron (ZOFRAN) injection 4 mg  4 mg Intravenous Q6H PRN D[place] Brick, Courage, MD   4 mg at 02/08/19 2334  . pantoprazole (PROTONIX) injection 40 mg  40 mg Intravenous Q24H Brahmbhatt, Parag, MD   40 mg at 02/10/19 1253  . polyethylene glycol (MIRALAX / GLYCOLAX) packet 17 g  17 g Oral BID Brahmbhatt, Parag, MD   17 g at 02/10/19 1253  . sodium chloride flush (NS) 0.9 % injection 10-40 mL  10-40 mL Intracatheter Q12H Clarene Essex, MD   10 mL at 02/07/19 2200  . sodium chloride flush (NS) 0.9 % injection 10-40 mL  10-40 mL Intracatheter PRN Clarene Essex, MD   10 mL at 02/10/19 1254  . sodium phosphate (FLEET) 7-19 GM/118ML enema 1 enema  1 enema Rectal Daily Brahmbhatt, Parag, MD   1 enema at 02/09/19 1120  . zolpidem (AMBIEN) tablet 5 mg  5 mg Oral QHS PRN Mercy Riding, MD       Facility-Administered Medications Ordered in Other Encounters  Medication Dose Route Frequency Provider Last Rate Last Dose  . ceFAZolin (ANCEF) 2-4 GM/100ML-% IVPB           . fentaNYL (SUBLIMAZE) 100 MCG/2ML injection           . midazolam (VERSED) 2 MG/2ML injection             VITAL SIGNS: BP (!) 125/92 (BP Location: Left Arm) Comment: Nurse Ivin Booty notified.  Pulse 91   Temp 98.5 F (36.9 C) (Oral)   Resp 16   Ht 6' (1.829 m)   Wt 84.5 kg   SpO2 100%   BMI 25.27 kg/m  Filed Weights   02/04/19 2254 02/06/19 0733  Weight: 84.5 kg 84.5 kg    Estimated body mass index is 25.27 kg/m as calculated from the following:   Height as of this encounter: 6' (1.829 m).   Weight as of this encounter: 84.5 kg.  LABS: CBC:    Component Value Date/Time   WBC 10.3  02/10/2019 0401   HGB 11.9 (L) 02/10/2019 0401   HGB 11.2 (L) 01/26/2019 1020   HGB 10.5 (L) 11/19/2018 0236   HCT 36.6 (L) 02/10/2019 0401   PLT 414 (H) 02/10/2019 0401   PLT 319 01/26/2019 1020   Comprehensive Metabolic Panel:    Component Value Date/Time   NA 134 (L) 02/10/2019 0401   K  4.6 02/10/2019 0401   CO2 25 02/10/2019 0401   BUN 14 02/10/2019 0401   CREATININE 0.83 02/10/2019 0401   CREATININE 0.79 01/26/2019 1020   ALBUMIN 2.3 (L) 02/05/2019 0515     Review of Systems  Neurological: Positive for weakness.  All other systems reviewed and are negative.    Physical Exam General: NAD, frail chronically-ill appearing, thin Cardiovascular: regular rate and rhythm Pulmonary: clear ant fields, diminished bases Abdomen: slightly distended and tender s/p Left Pleurx, + bowel sounds Extremities: no edema, no joint deformities Skin: no rashes, scattered bruising Neurological: Weakness but otherwise nonfocal   Prognosis: < 6 months in the setting of recurrent malignant ascites s/p Pleurx, colonic obstruction, metastatic pancreatic cancer with carcinomatosis, generalized weakness, decreased po intake.   Discharge Planning:  Home with Hospice  Recommendations:  DNR/DNI-as confirmed by patient  Home with hospice per patient.   Cheri, RN with [facility] involved with home care and set-up.   PMT will continue to support and follow as needed  Palliative Performance Scale: PPS 40%              Patient expressed understanding and was in agreement with this plan.   Thank you for allowing the Palliative Medicine Team to assist in the care of this patient.  Time In: 1300 Time Out: 1355 Time Total: 50 min.   Visit consisted of counseling and education dealing with the complex and emotionally intense issues of symptom management and palliative care in the setting of serious and potentially life-threatening illness.Greater than 50%  of this time was spent  counseling and coordinating care related to the above assessment and plan.  Signed by:  Alda Lea, AGPCNP-BC Palliative Medicine Team  Phone: [phone] Fax: [phone] Pager: [phone] Amion: Bjorn Pippin

## 2019-02-10 NOTE — Progress Notes (Signed)
[facility] has accetped hospice services when pt gets home. Please send pt home with starter kit for pleurex drain and we will order needed supplies once at home. The pt does not need any equipment in home. [name] the wife is ready to receive pt and is aware that discharge plan will be tomorrow based on the nurse information Denna at hospital. She will pick pt up by car. We will follow and get pt enrolled in hospice services once he gets home. Thank you for all your help with caring for pt. If you have questions please call me. Webb Silversmith RN hospital liaison [phone]

## 2019-02-10 NOTE — Progress Notes (Signed)
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  NGT placed.  Patient underwent colonoscopy with colonic stent/prosthesis to distal sigmoid colon on 02/06/2019 by Dr. Watt Climes.   Patient and wife are decided to go with palliative/hospice care post discharge.  No further therapeutic chemotherapy per oncology, Dr. 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 Last chemo on 01/19/2019-previously did not tolerate-no further therapeutic chemo per oncology, Dr. Burr Medico -Colonoscopy with colonic stent/prosthesis placement by Dr. Watt Climes on 02/06/2019. NG tube came out on 02/08/2019. Per GI: Patient may need diverting colostomy if no improvement. Surgery  is involved. Pt is not a good candidate for surgery. advanced bowel regimen. Underwent peritoneal cath placement by IR today. No BM, no flatus yet. No vomiting. No indications for NGT at this time. Cont PPI, scheduled MiraLAX, daily enema, clear liquid diet. Dulcolax suppository today. appreciate GI, IR, Surgery recs   Recurrent malignant ascites due to metastatic pancreatic cancer. Underwent paracentesis 4.8L removal and peritoneal cath placement today.   Chronic anticoagulation: Reports history of left upper extremity/shoulder DVT although upper extremity venous Doppler did not show thrombus in 09/08/2018.  No history of A. Fib. Obviously high risk for DVT due  to malignancy. Resume Xarelto on 7/8. Continue prophylactic Lovenox for now   Tobacco use disorder. Encourage cessation and provided nicotine patch  Anxiety/insomnia. Continue Ativan. Added nightly Ambien.  Social/Ethics: Patient is now DNR planning to enroll in hospice with [employer].  No further chemo. Consulted palliative care today   Code Status: DNR  Family Communication: d/w patient, RN. Called updated his wife (indicate person spoken with, relationship, and if by phone, the number) Disposition Plan: home soon, hoping for 7/8. awaiting improvement bowel obstruction    Consultants:  GI  Palliative    Oncology   Procedures: 02/06/2019: Colonoscopy with colonic stent/prostatitis 7/7: peritoneal cath    Antibiotics: Anti-infectives (From admission, onward)   Start     Dose/Rate Route Frequency Ordered Stop   02/10/19 0815  ceFAZolin (ANCEF) IVPB 2g/100 mL premix     2 g 200 mL/hr over 30 Minutes Intravenous To Radiology 02/10/19 0800 02/11/19 0815   02/09/19 0600  ceFAZolin (ANCEF) IVPB 2g/100 mL premix     2 g 200 mL/hr over 30 Minutes Intravenous To Radiology 02/08/19 0753 02/10/19 0600       (indicate start date, and stop date if known)  HPI/Subjective: Feels much better after paracentesis. Removed 4.8L. no BM, no flatus yet. Abdominal is lees distended after paracentesis . No vomiting. + flatus.   Objective: Vitals:   02/09/19 1958 02/10/19 0341  BP: (!) 133/100 (!) 125/92  Pulse: (!) 106 91  Resp: 16 16  Temp: (!) 97.4 F (36.3 C) 98.5 F (36.9 C)  SpO2: 100% 100%    Intake/Output Summary (Last 24 hours) at 02/10/2019 1150 Last data filed at 02/10/2019 0900 Gross per 24 hour  Intake 240 ml  Output -  Net 240 ml   Filed Weights   02/04/19 2254 02/06/19 0733  Weight: 84.5 kg 84.5 kg  Exam:   General:  No distress   Cardiovascular: s1,s2 rrr  Respiratory: CTA BL  Abdomen: tense, distended, NT+bs  Musculoskeletal: no leg edema     Data Reviewed: Basic Metabolic Panel: Recent Labs  Lab 02/05/19 0515 02/07/19 0414 02/08/19 0816 02/08/19 2352 02/10/19 0401  NA 131* 134* 135 131* 134*  K 4.2 4.1 3.8 3.9 4.6  CL 95* 99 102 100 99  CO2 26 25 24  21* 25  GLUCOSE 138* 122* 125* 139* 115*  BUN 8 8 9 11 14   CREATININE 0.73 0.86 0.77 0.82 0.83  CALCIUM 8.1* 8.6* 8.3* 8.4* 8.8*  MG  --   --   --   --  2.4   Liver Function Tests: Recent Labs  Lab 02/04/19 2212 02/05/19 0515  AST 29 25  ALT 41 35  ALKPHOS 100 93  BILITOT 0.8 0.5  PROT 6.1* 5.3*  ALBUMIN 2.8* 2.3*   No results for input(s): LIPASE, AMYLASE in the last 168 hours. No results for input(s): AMMONIA in the last 168 hours. CBC: Recent Labs  Lab 02/04/19 2212 02/05/19 0515 02/07/19 0414 02/08/19 0816 02/08/19 2352 02/10/19 0401  WBC 12.4* 9.0 10.2 10.1 11.1* 10.3  NEUTROABS 9.7*  --   --   --   --   --   HGB 12.9* 11.3* 12.4* 11.7* 12.4* 11.9*  HCT 39.2 34.0* 38.6* 35.5* 37.3* 36.6*  MCV 84.3 84.2 86.4 85.5 84.4 85.5  PLT 467* 419* 499* 420* 463* 414*   Cardiac Enzymes: No results for input(s): CKTOTAL, CKMB, CKMBINDEX, TROPONINI in the last 168 hours. BNP (last 3 results) Recent Labs    11/18/18 2248  BNP 106.2*    ProBNP (last 3 results) No results for input(s): PROBNP in the last 8760 hours.  CBG: No results for input(s): GLUCAP in the last 168 hours.  Recent Results (from the past 240 hour(s))  SARS Coronavirus 2 (CEPHEID - Performed in [place] hospital lab), Hosp Order     Status: None   Collection Time: 02/04/19 11:32 PM   Specimen: Nasopharyngeal Swab  Result Value Ref Range Status   SARS Coronavirus 2 NEGATIVE NEGATIVE Final    Comment: (NOTE) If result is NEGATIVE SARS-CoV-2 target nucleic acids are NOT DETECTED. The SARS-CoV-2 RNA is generally detectable in upper and lower  respiratory specimens during the acute phase of infection. The lowest  concentration of SARS-CoV-2 viral copies this assay can detect  is 250  copies / mL. A negative result does not preclude SARS-CoV-2 infection  and should not be used as the sole basis for treatment or other  patient management decisions.  A negative result may occur with  improper specimen collection / handling, submission of specimen other  than nasopharyngeal swab, presence of viral mutation(s) within the  areas targeted by this assay, and inadequate number of viral copies  (<250 copies / mL). A negative result must be combined with clinical  observations, patient history, and epidemiological information. If result is POSITIVE SARS-CoV-2 target nucleic acids are DETECTED. The SARS-CoV-2 RNA is generally detectable in upper and lower  respiratory specimens dur ing the acute phase of infection.  Positive  results are indicative of active infection with SARS-CoV-2.  Clinical  correlation with patient history and other diagnostic information is  necessary to determine patient infection status.  Positive results do  not rule out bacterial infection or co-infection with other viruses. If result is PRESUMPTIVE POSTIVE SARS-CoV-2 nucleic acids MAY BE PRESENT.   A presumptive positive result  was obtained on the submitted specimen  and confirmed on repeat testing.  While 2019 novel coronavirus  (SARS-CoV-2) nucleic acids may be present in the submitted sample  additional confirmatory testing may be necessary for epidemiological  and / or clinical management purposes  to differentiate between  SARS-CoV-2 and other Sarbecovirus currently known to infect humans.  If clinically indicated additional testing with an alternate test  methodology [phone]) is advised. The SARS-CoV-2 RNA is generally  detectable in upper and lower respiratory sp ecimens during the acute  phase of infection. The expected result is Negative. Fact Sheet for Patients:  StrictlyIdeas.no Fact Sheet for Healthcare  Providers: BankingDealers.co.za This test is not yet approved or cleared by the [place] FDA and has been authorized for detection and/or diagnosis of SARS-CoV-2 by FDA under an Emergency Use Authorization (EUA).  This EUA will remain in effect (meaning this test can be used) for the duration of the COVID-19 declaration under Section 564(b)(1) of the Act, 21 U.S.C. section 360bbb-3(b)(1), unless the authorization is terminated or revoked sooner. Performed at [facility], [place] [address] [place], [place] [address]   Novel Coronavirus, NAA (hospital order; send-out to ref lab)     Status: None   Collection Time: 02/06/19  7:37 AM   Specimen: Nasopharyngeal Swab; Respiratory  Result Value Ref Range Status   SARS-CoV-2, NAA NOT DETECTED NOT DETECTED Final    Comment: (NOTE) This test was developed and its performance characteristics determined by [employer]. This test has not been FDA cleared or approved. This test has been authorized by FDA under an Emergency Use Authorization (EUA). This test is only authorized for the duration of time the declaration that circumstances exist justifying the authorization of the emergency use of in vitro diagnostic tests for detection of SARS-CoV-2 virus and/or diagnosis of COVID-19 infection under section 564(b)(1) of the Act, 21 U.S.C. 818HUD-1(S)(9), unless the authorization is terminated or revoked sooner. When diagnostic testing is negative, the possibility of a false negative result should be considered in the context of a patient's recent exposures and the presence of clinical signs and symptoms consistent with COVID-19. An individual without symptoms of COVID-19 and who is not shedding SARS-CoV-2 virus would expect to have a negative (not detected) result in this assay. Performed  At: [facility] [address] [place], [place] [id number] Rush Farmer MD IF:[id number]    [place]  Final    Comment: Performed at [facility], [place] [address] [place], [place] [address]     Studies: Dg Abd Portable 1v  Result Date: 02/10/2019 CLINICAL DATA:  Abdominal distention. EXAM: PORTABLE ABDOMEN - 1 VIEW COMPARISON:  02/08/2019 and 02/07/2019 and CT scan dated 02/05/2019 FINDINGS: There is air and stool within the colon to the level of the stent in the sigmoid region. Overall density of the abdomen consistent with ascites as previously demonstrated. Liver stent in place. No dilated small bowel. Stomach is not distended. IMPRESSION: Ascites. No significant change. Persistent stool and air in the colon to the level of the sigmoid region. Electronically Signed   By: Lorriane Shire M.D.   On: 02/10/2019 08:11   Dg Abd Portable 1v  Result Date: 02/09/2019 CLINICAL DATA:  Abdominal distention. EXAM: PORTABLE ABDOMEN - 1 VIEW COMPARISON:  02/08/2019. FINDINGS: Surgical clips right upper quadrant. Biliary stent and rectosigmoid stent noted in stable position. Stool in the colon. Slightly distended loops of small and large bowel are noted most consistent with an  adynamic ileus. Follow-up exam suggested in order to demonstrate resolution and to exclude bowel obstruction. No free air. Degenerative changes lumbar spine and both hips. Pelvic calcifications consistent phleboliths. IMPRESSION: Biliary stent and rectosigmoid stent noted in stable position. Slightly distended loops of small and large bowel are noted most consistent adynamic ileus. Follow-up exam suggested in order to demonstrate resolution and to exclude bowel obstruction. Electronically Signed   By: Marcello Moores  Register   On: 02/09/2019 07:20    Scheduled Meds: . bisacodyl  10 mg Rectal Once  . diatrizoate meglumine-sodium  30 mL Oral Once  . nicotine  21 mg Transdermal Once  . nicotine  21 mg Transdermal Daily  . pantoprazole (PROTONIX) IV  40 mg Intravenous Q24H  . polyethylene glycol  17 g Oral BID  . sodium  chloride flush  10-40 mL Intracatheter Q12H  . sodium phosphate  1 enema Rectal Daily   Continuous Infusions: . sodium chloride 10 mL/hr (02/09/19 0000)  .  ceFAZolin (ANCEF) IV    . dextrose 5 % and 0.9% NaCl 75 mL/hr at 02/07/19 0200    Principal Problem:   Intestinal obstruction (HCC) Active Problems:   Jaundice   Tobacco use   Adenocarcinoma of pancreas (HCC)   Nausea & vomiting    Time spent: >35 minutes     Kinnie Feil  [facility] Pager [phone]. If 7PM-7AM, please contact night-coverage at [email or web address], password [facility] 02/10/2019, 11:50 AM  LOS: 6 days

## 2019-02-10 NOTE — Care Management (Addendum)
Case manager spoke with Webb Silversmith, [facility] Nurse Liaison concerning patient on 02/09/19.Patient should not have any needs for home but should he, please contact her at : [phone]. Per notes of today, patient had a peritoneal drain placed and will need to go home with a plurex start drain kit. Case manager spoke with bedside RN, Hinton Dyer, and asked her to get the kit to the patient's bedside to go home with him. Patient's wife will pick him up on discharge.   Ricki Miller, RN BSN Case Manager [phone]

## 2019-02-10 NOTE — Procedures (Signed)
Interventional Radiology Procedure:   Indications: Malignant ascites  Procedure: Placement of tunneled peritoneal catheter  Findings: Placed drain on left side.  4.8 liters of yellow fluid removed.     Complications: None     EBL: less than 20 ml  Plan: May use Pleurx catheter as needed.     Rehmat Murtagh R. Anselm Pancoast, MD  Pager: [phone]

## 2019-02-10 NOTE — Consult Note (Signed)
Chief Complaint: Patient was seen in consultation today for tunneled peritoneal pleurX catheter placement Chief Complaint  Patient presents with   Abdominal Pain   at the request of Dr Margurite Auerbach  Referring Physician(s): DR Burr Medico  Supervising Physician: Markus Daft  Patient Status: [facility] - In-pt  History of Present Illness: Ernest Wu is a 58 y.o. 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  Was scheduled for OP peritoneal PleurX drain yesterday-- but was admitted for pain control over weekend Now with bowel obstruction; abd distension Was seen by [facility]-- not deemed candidate for loop colostomy  Request made to move forward with tunneled peritoneal drain placement Imaging and chart reviewed with Dr Vangie Bicker procedure    Past Medical History:  Diagnosis Date   Anxiety    Family history of breast cancer    Family history of colonic polyps    Family history of pancreatic cancer    Family history of prostate cancer    GERD (gastroesophageal reflux disease)    Intestinal obstruction ([place]) 02/05/2019   pancreatic ca 06/2018   Pancreatic    Past Surgical History:  Procedure Laterality Date   BILIARY STENT PLACEMENT  06/10/2018   Procedure: BILIARY STENT PLACEMENT;  Surgeon: Ronnette Juniper, MD;  Location: [place];  Service: Gastroenterology;;   CHOLECYSTECTOMY N/A 11/10/2018   Procedure: Laparoscopic Cholecystectomy;  Surgeon: Stark Klein, MD;  Location: [place];  Service: General;  Laterality: N/A;   COLONIC STENT PLACEMENT N/A 02/06/2019   Procedure: COLONIC STENT PLACEMENT;  Surgeon: Clarene Essex, MD;  Location: [place];  Service: Endoscopy;  Laterality: N/A;   COLONOSCOPY WITH PROPOFOL N/A 02/06/2019   Procedure:  COLONOSCOPY WITH PROPOFOL for  colonic stent placement;  Surgeon: Clarene Essex, MD;  Location: [place];  Service: Endoscopy;  Laterality: N/A;   DIAGNOSTIC LAPAROSCOPIC LIVER BIOPSY N/A 11/10/2018   Procedure: Diagnostic Laparoscopic Liver Biopsy;  Surgeon: Stark Klein, MD;  Location: [place];  Service: General;  Laterality: N/A;   ERCP N/A 06/10/2018   Procedure: ENDOSCOPIC RETROGRADE CHOLANGIOPANCREATOGRAPHY (ERCP);  Surgeon: Ronnette Juniper, MD;  Location: [place];  Service: Gastroenterology;  Laterality: N/A;   ESOPHAGOGASTRODUODENOSCOPY (EGD) WITH PROPOFOL N/A 06/10/2018   Procedure: ESOPHAGOGASTRODUODENOSCOPY (EGD) WITH PROPOFOL;  Surgeon: Ronnette Juniper, MD;  Location: [place];  Service: Gastroenterology;  Laterality: N/A;   FINE NEEDLE ASPIRATION  06/10/2018   Procedure: FINE NEEDLE ASPIRATION (FNA) LINEAR;  Surgeon: Ronnette Juniper, MD;  Location: [facility] ENDOSCOPY;  Service: Gastroenterology;;   IR RADIOLOGIST EVAL & MGMT  12/10/2018   PORT-A-CATH REMOVAL N/A 11/10/2018   Procedure: REMOVAL PORT-A-CATH;  Surgeon: Stark Klein, MD;  Location: [place];  Service: General;  Laterality: N/A;   PORTACATH PLACEMENT N/A 07/02/2018   Procedure: INSERTION PORT-A-CATH;  Surgeon: Stark Klein, MD;  Location: [place];  Service: General;  Laterality: N/A;   SPHINCTEROTOMY  06/10/2018   Procedure: SPHINCTEROTOMY;  Surgeon: Ronnette Juniper, MD;  Location: [facility] ENDOSCOPY;  Service: Gastroenterology;;   UPPER ESOPHAGEAL ENDOSCOPIC ULTRASOUND (EUS) N/A 06/10/2018   Procedure: UPPER ESOPHAGEAL ENDOSCOPIC ULTRASOUND (EUS);  Surgeon: Ronnette Juniper, MD;  Location: [place];  Service: Gastroenterology;  Laterality: N/A;   VASECTOMY      Allergies: Contrast media [iodinated diagnostic agents] and Iohexol  Medications: Prior to Admission medications   Medication Sig Start Date End Date Taking? Authorizing Provider  ALPRAZolam (XANAX) 0.25 MG tablet Take 0.25 mg by mouth 2 (two) times daily as needed for anxiety.     [provider]  diphenoxylate-atropine (LOMOTIL) 2.5-0.025 MG tablet Take 2 tablets by mouth 4 (four) times daily as needed for diarrhea or loose stools. Patient not taking: Reported on 12/26/2018 06/27/18   Alla Feeling, NP  docusate sodium (COLACE) 100 MG capsule Take 200 mg by mouth daily as needed for mild constipation.    [provider]  esomeprazole (NEXIUM) 20 MG capsule Take 20 mg by mouth daily after breakfast.     [provider]  folic acid (FOLVITE) 1 MG tablet Take 1 tablet (1 mg total) by mouth daily after breakfast. 12/08/18   [name], MD  lactulose (CHRONULAC) 10 GM/15ML solution Take 45 mLs (30 g total) by mouth 3 (three) times daily. Patient taking differently: Take 30 g by mouth 2 (two) times daily as needed for moderate constipation.  02/02/19   Harle Stanford., PA-C  magic mouthwash w/lidocaine SOLN Take 5 mLs by mouth 4 (four) times daily as needed for mouth pain. 08/22/18   Tanner, Lyndon Code., PA-C  Methylcobalamin (B-12) 5000 MCG TBDP Take 5,000 mcg by mouth daily.    [provider]  ondansetron (ZOFRAN) 8 MG tablet Take 1 tablet (8 mg total) by mouth every 8 (eight) hours as needed for nausea or vomiting. 12/22/18   Alla Feeling, NP  oxyCODONE (OXY IR/ROXICODONE) 5 MG immediate release tablet Take 5 mg by mouth every 4 (four) hours as needed for severe pain.    [provider]  polyethylene glycol (MIRALAX / GLYCOLAX) 17 g packet Take 17 g by mouth daily as needed for mild constipation.     [provider]  prochlorperazine (COMPAZINE) 10 MG tablet Take 1 tablet (10 mg total) by mouth every 6 (six) hours as needed (NAUSEA). 01/19/19   [name], MD  rivaroxaban (XARELTO) 20 MG TABS tablet Take 1 tablet (20 mg total) by mouth daily after breakfast. 01/08/19   [name], MD  simethicone (MYLICON) 161 MG chewable tablet Chew 250 mg by mouth every 6 (six) hours as needed for flatulence.     [provider]  Syringe,  Disposable, (10-12CC SYRINGE) 12 ML MISC 10 mLs by Does not apply route as needed. 12/04/18   Alla Feeling, NP  traMADol (ULTRAM) 50 MG tablet Take 50 mg by mouth every 6 (six) hours as needed (pain).     [provider]     Family History  Problem Relation Age of Onset   Stroke Mother    Hypertension Mother    CAD Father        CABG   Pancreatic cancer Paternal Grandfather    Breast cancer Maternal Aunt    Lymphoma Paternal Uncle    Other Maternal Uncle        [name]   Stroke Maternal Grandmother    Prostate cancer Cousin        pat first cousin, dx between 20-60    Social History   Socioeconomic History   Marital status: Married    Spouse name: Not on file   Number of children: 5   Years of education: Not on file   Highest education level: Not on file  Occupational History   Not on file  Social Needs   Financial resource strain: Not on file   Food insecurity    Worry: Not on file  Inability: Not on file   Transportation needs    Medical: Not on file    Non-medical: Not on file  Tobacco Use   Smoking status: Former Smoker    Packs/day: 0.25    Years: 30.00    Pack years: 7.50    Types: Cigarettes    Quit date: 02/02/2019    Years since quitting: 0.0   Smokeless tobacco: Never Used   Tobacco comment: cut back considerably  Substance and Sexual Activity   Alcohol use: Yes    Comment: 6-7 beers Q week   Drug use: Never   Sexual activity: Not on file  Lifestyle   Physical activity    Days per week: Not on file    Minutes per session: Not on file   Stress: Not on file  Relationships   Social connections    Talks on phone: Not on file    Gets together: Not on file    Attends religious service: Not on file    Active member of club or organization: Not on file    Attends meetings of clubs or organizations: Not on file    Relationship status: Not on file  Other Topics Concern   Not on file  Social History  Narrative   Not on file    Review of Systems: A 12 point ROS discussed and pertinent positives are indicated in the HPI above.  All other systems are negative.  Review of Systems  Constitutional: Positive for activity change, appetite change and fatigue. Negative for fever.  Respiratory: Positive for shortness of breath.   Cardiovascular: Negative for chest pain.  Gastrointestinal: Positive for abdominal distention.  Musculoskeletal: Positive for back pain and gait problem.  Neurological: Positive for weakness.  Psychiatric/Behavioral: Negative for behavioral problems and confusion.    Vital Signs: BP (!) 125/92 (BP Location: Left Arm) Comment: Nurse Ivin Booty notified.   Pulse 91    Temp 98.5 F (36.9 C) (Oral)    Resp 16    Ht 6' (1.829 m)    Wt 186 lb 4.6 oz (84.5 kg)    SpO2 100%    BMI 25.27 kg/m   Physical Exam Vitals signs reviewed.  Cardiovascular:     Heart sounds: Normal heart sounds.  Pulmonary:     Effort: Pulmonary effort is normal.  Abdominal:     General: There is distension.     Palpations: Abdomen is rigid.     Tenderness: There is abdominal tenderness.  Skin:    General: Skin is warm and dry.  Neurological:     Mental Status: He is alert and oriented to person, place, and time.  Psychiatric:        Behavior: Behavior normal.     Imaging: Ct Abdomen Pelvis Wo Contrast  Result Date: 02/05/2019 CLINICAL DATA:  Bowel obstruction EXAM: CT ABDOMEN AND PELVIS WITHOUT CONTRAST TECHNIQUE: Multidetector CT imaging of the abdomen and pelvis was performed following the standard protocol without IV contrast. COMPARISON:  Radiograph 02/04/2019, CT 02/01/2019, 12/26/2018, 12/10/2018 FINDINGS: Lower chest: Lung bases demonstrate no acute consolidation or effusion. Stable nodular foci at the right middle lobe. Heart size upper normal. Esophageal tube within the distal esophagus, the tip terminates at the distal stomach. Hepatobiliary: No focal hepatic abnormality. Small  amount of pneumobilia. Distal common duct stent. Pancreas: Atrophic.  No ductal dilatation Spleen: Normal in size without focal abnormality. Adrenals/Urinary Tract: Adrenal glands are unremarkable. Kidneys are normal, without renal calculi, focal lesion, or hydronephrosis. Bladder is slightly thick  walled. Stomach/Bowel: The stomach is nonenlarged. There is no dilated small bowel. Diffusely enlarged colon, contains contrast. Cecal dilatation up 10.3 cm. This appears increased. Transverse colon dilated up to 7.1 cm. Irregular decompressed appearance of the rectosigmoid colon. Small amount of contrast within the rectum. Reflux contrast into slightly dilated distal small bowel. Vascular/Lymphatic: Mild aortic atherosclerosis without aneurysm. Reproductive: Prostate is unremarkable. Other: No free air. Moderate volume of ascites within the abdomen with small amount of ascites in the pelvis. Diffuse nodular infiltration of the omentum suspicious for carcinomatosis. Fat within the inguinal canals. Musculoskeletal: No acute or suspicious osseous abnormality. IMPRESSION: 1. Marked dilatation of the colon with cecal dilatation up to 10.3 cm, appears increased as compared with prior CT. Irregular collapsed segment of rectosigmoid colon, raises possibility of stricture. Contrast was administered rectally and did manage to pass this segment of bowel into the upstream colon. 2. Moderate ascites within the abdomen with nodular infiltration of the omentum and mesentery, concerning for carcinomatosis. 3. Small amount of pneumobilia, presumably related to distal common duct stent. Electronically Signed   By: Donavan Foil M.D.   On: 02/05/2019 04:05   Ct Abdomen Pelvis Wo Contrast  Result Date: 02/01/2019 CLINICAL DATA:  Abdominal distension, constipation, nausea, symptoms worse for 1 week, recent paracentesis, history pancreatic cancer, smoker EXAM: CT ABDOMEN AND PELVIS WITHOUT CONTRAST TECHNIQUE: Multidetector CT imaging of  the abdomen and pelvis was performed following the standard protocol without IV contrast. Sagittal and coronal MPR images reconstructed from axial data set. Patient drank dilute oral contrast for exam COMPARISON:  12/26/2018 FINDINGS: Lower chest: Ill-defined nodular density LEFT lower lobe 4 mm diameter image 15 unchanged. Additional tiny subpleural density LEFT lower lobe laterally adjacent to major fissure image 14 unchanged. Hepatobiliary: Pneumobilia consistent likely due to noted distal CBD wall stent. Gallbladder surgically absent. No focal hepatic mass lesions. Pancreas: Atrophic pancreas. Fullness of pancreatic head versus body though no discrete mass or pancreatic ductal dilatation is seen. Spleen: Normal appearance Adrenals/Urinary Tract: Adrenal glands normal appearance. Kidneys, ureters, and bladder normal appearance Stomach/Bowel: Distended stomach. Diffuse colonic distension attending stool, with contrast in the RIGHT colon. Cecum 8.7 cm transverse. Rectosigmoid colon nondistended. Nonobstructed small bowel loops, oral contrast in RIGHT colon. Appendix not visualized. No definite bowel wall thickening. Vascular/Lymphatic: Atherosclerotic calcifications Reproductive: Mild prostatic enlargement. Seminal vesicles unremarkable. Other: Scattered ascites. Diffuse omental stranding increased from previous exam. Areas of thickening and slight nodularity at peritoneal surfaces. Findings are consistent with peritoneal carcinomatosis. BILATERAL inguinal hernias containing fat. No free air. Tiny umbilical hernia containing fat. Interloop fluid. Musculoskeletal: No acute osseous lesions. IMPRESSION: Increased ascites. Increased omental infiltration with scattered areas of thickening and slight nodularity a peritoneal surfaces in the pelvis, consistent with peritoneal carcinomatosis. Mild colonic distention proximally without discrete obstruction though the sigmoid colon and rectum appear decompressed and there  appear to be adjacent pelvic tumor deposits. Prior CBD stenting. Electronically Signed   By: Lavonia Dana M.D.   On: 02/01/2019 15:28   Dg Abd 1 View  Result Date: 02/06/2019 CLINICAL DATA:  Sigmoid stent placement. EXAM: ABDOMEN - 1 VIEW; DG C-ARM 61-120 MIN COMPARISON:  CT abdomen pelvis from yesterday. FLUOROSCOPY TIME:  1 minutes, 26 seconds. C-arm fluoroscopic images were obtained intraoperatively and submitted for post operative interpretation. FINDINGS: Intraprocedural fluoroscopic images demonstrate interval placement of a sigmoid colonic stent. Prominent narrowing of the mid stent. IMPRESSION: 1. Intraprocedural fluoroscopic guidance for sigmoid colon stent placement. Electronically Signed   By: Orville Govern.D.  On: 02/06/2019 12:11   US Paracentesis  Result Date: 02/02/2019 INDICATION: Patient with history of metastatic pancreatic cancer, recurrent ascites. Request made for therapeutic paracentesis. EXAM: ULTRASOUND GUIDED THERAPEUTIC PARACENTESIS MEDICATIONS: None COMPLICATIONS: None immediate. PROCEDURE: Informed written consent was obtained from the patient after a discussion of the risks, benefits and alternatives to treatment. A timeout was performed prior to the initiation of the procedure. Initial ultrasound scanning demonstrates a small to moderate amount of ascites within the left lower abdominal quadrant. The left lower abdomen was prepped and draped in the usual sterile fashion. 1% lidocaine was used for local anesthesia. Following this, a 19 gauge, 7-cm, Yueh catheter was introduced. An ultrasound image was saved for documentation purposes. The paracentesis was performed. The catheter was removed and a dressing was applied. The patient tolerated the procedure well without immediate post procedural complication. FINDINGS: A total of approximately 1.9 liters of slightly hazy, yellow fluid was removed. IMPRESSION: Successful ultrasound-guided therapeutic paracentesis yielding 1.9  liters of peritoneal fluid. Read by: Rowe Robert, PA-C Electronically Signed   By: Sandi Mariscal M.D.   On: 02/02/2019 17:04   US Paracentesis  Result Date: 01/30/2019 INDICATION: Pancreatic cancer with ascites. Request for diagnostic and therapeutic paracentesis. EXAM: ULTRASOUND GUIDED PARACENTESIS MEDICATIONS: 1% lidocaine 10 mL COMPLICATIONS: None immediate. PROCEDURE: Informed written consent was obtained from the patient after a discussion of the risks, benefits and alternatives to treatment. A timeout was performed prior to the initiation of the procedure. Initial ultrasound scanning demonstrates a moderate amount of ascites within the right lower abdominal quadrant. The right lower abdomen was prepped and draped in the usual sterile fashion. 1% lidocaine with epinephrine was used for local anesthesia. Following this, a 19 gauge, 7-cm, Yueh catheter was introduced. An ultrasound image was saved for documentation purposes. The paracentesis was performed. The catheter was removed and a dressing was applied. The patient tolerated the procedure well without immediate post procedural complication. FINDINGS: A total of approximately 2.5 L of clear yellow fluid was removed. Samples were sent to the laboratory as requested by the clinical team. IMPRESSION: Successful ultrasound-guided paracentesis yielding 2.5 liters of peritoneal fluid. Read by: Gareth Eagle PA-C Electronically Signed   By: Jerilynn Mages.  Shick M.D.   On: 01/30/2019 16:46   Dg Abd Acute W/chest  Result Date: 02/04/2019 CLINICAL DATA:  Abdominal pain EXAM: DG ABDOMEN ACUTE W/ 1V CHEST COMPARISON:  CT 628 1696 FINDINGS: Metallic stent noted in the right abdomen, likely biliary. Marked gaseous distention of the transverse colon with air-fluid levels. Pneumobilia noted. Prior cholecystectomy. No free air organomegaly. Heart is normal size. Lungs clear. No effusions or acute bony abnormality. IMPRESSION: Prior cholecystectomy with biliary stent and  pneumobilia. Marked gaseous distention of the colon, most notable in the transverse colon with air-fluid levels, similar to prior CT. Electronically Signed   By: Rolm Baptise M.D.   On: 02/04/2019 22:50   Dg Abd Portable 1v  Result Date: 02/09/2019 CLINICAL DATA:  Abdominal distention. EXAM: PORTABLE ABDOMEN - 1 VIEW COMPARISON:  02/08/2019. FINDINGS: Surgical clips right upper quadrant. Biliary stent and rectosigmoid stent noted in stable position. Stool in the colon. Slightly distended loops of small and large bowel are noted most consistent with an adynamic ileus. Follow-up exam suggested in order to demonstrate resolution and to exclude bowel obstruction. No free air. Degenerative changes lumbar spine and both hips. Pelvic calcifications consistent phleboliths. IMPRESSION: Biliary stent and rectosigmoid stent noted in stable position. Slightly distended loops of small and large bowel are  noted most consistent adynamic ileus. Follow-up exam suggested in order to demonstrate resolution and to exclude bowel obstruction. Electronically Signed   By: Marcello Moores  Register   On: 02/09/2019 07:20   Dg Abd Portable 1v  Result Date: 02/08/2019 CLINICAL DATA:  Abdominal distension EXAM: PORTABLE ABDOMEN - 1 VIEW COMPARISON:  Multiple previous films.  The most recent is 02/07/2019 FINDINGS: The NG tube is not visualized and was likely removed. It could have also been pulled back into the esophagus. Recommend clinical correlation. The biliary stent and rectosigmoid stents are stable. There is less contrast in the right colon. Some persistent air distended transverse colon. Scattered air-filled small bowel loops but no distension. No definite free air. IMPRESSION: Some interval passage of colonic contrast. No findings for small bowel obstruction or free air. The NG tube is no longer visualized. Electronically Signed   By: Marijo Sanes M.D.   On: 02/08/2019 09:01   Dg Abd Portable 1v  Result Date: 02/07/2019 CLINICAL  DATA:  Abdominal distension. EXAM: PORTABLE ABDOMEN - 1 VIEW COMPARISON:  Radiograph of February 04, 2019. FINDINGS: Distal tip of nasogastric tube is seen in the stomach. Biliary stent is noted and unchanged in position. There is been interval placement of sigmoid colon stent. Mild dilatation of right and transverse colon is noted which is air-filled. No small bowel dilatation is noted. IMPRESSION: Interval placement of sigmoid colon stent. Stable position of biliary stent and nasogastric tube. Stable dilatation of proximal colon is noted. Electronically Signed   By: Marijo Conception M.D.   On: 02/07/2019 09:42   Dg Abd Portable 1 View  Result Date: 02/04/2019 CLINICAL DATA:  NG tube placement EXAM: PORTABLE ABDOMEN - 1 VIEW COMPARISON:  02/04/2019 FINDINGS: NG tube has been placed with the tip in the distal stomach. Gaseous distention of the colon, predominantly transverse colon again noted, unchanged. IMPRESSION: NG tube tip in the distal stomach. Electronically Signed   By: Rolm Baptise M.D.   On: 02/04/2019 23:49   Dg C-arm 1-60 Min  Result Date: 02/06/2019 CLINICAL DATA:  Sigmoid stent placement. EXAM: ABDOMEN - 1 VIEW; DG C-ARM 61-120 MIN COMPARISON:  CT abdomen pelvis from yesterday. FLUOROSCOPY TIME:  1 minutes, 26 seconds. C-arm fluoroscopic images were obtained intraoperatively and submitted for post operative interpretation. FINDINGS: Intraprocedural fluoroscopic images demonstrate interval placement of a sigmoid colonic stent. Prominent narrowing of the mid stent. IMPRESSION: 1. Intraprocedural fluoroscopic guidance for sigmoid colon stent placement. Electronically Signed   By: Titus Dubin M.D.   On: 02/06/2019 12:11    Labs:  CBC: Recent Labs    02/07/19 0414 02/08/19 0816 02/08/19 2352 02/10/19 0401  WBC 10.2 10.1 11.1* 10.3  HGB 12.4* 11.7* 12.4* 11.9*  HCT 38.6* 35.5* 37.3* 36.6*  PLT 499* 420* 463* 414*    COAGS: Recent Labs    07/15/18 1140 11/10/18 0810 11/21/18 0453  11/21/18 1513 02/08/19 2352  INR 0.93 0.9 1.1  --  1.1  APTT  --   --   --  33  --     BMP: Recent Labs    02/07/19 0414 02/08/19 0816 02/08/19 2352 02/10/19 0401  NA 134* 135 131* 134*  K 4.1 3.8 3.9 4.6  CL 99 102 100 99  CO2 25 24 21* 25  GLUCOSE 122* 125* 139* 115*  BUN 8 9 11 14   CALCIUM 8.6* 8.3* 8.4* 8.8*  CREATININE 0.86 0.77 0.82 0.83  GFRNONAA >60 >60 >60 >60  GFRAA >60 >60 >60 >60  LIVER FUNCTION TESTS: Recent Labs    01/26/19 1020 02/01/19 1320 02/04/19 2212 02/05/19 0515  BILITOT <0.2* 0.1* 0.8 0.5  AST 36 24 29 25   ALT 76* 39 41 35  ALKPHOS 142* 116 100 93  PROT 6.9 6.7 6.1* 5.3*  ALBUMIN 3.3* 3.1* 2.8* 2.3*    TUMOR MARKERS: No results for input(s): AFPTM, CEA, CA199, CHROMGRNA in the last 8760 hours.  Assessment and Plan:  Pancreatic cancer Carcinomatosis Malignant ascites-- recurrent Scheduled for tunneled peritoneal pleurX catheter placement Risks and benefits discussed with the patient including bleeding, infection, damage to adjacent structures, bowel perforation, and sepsis.  All of the patient's questions were answered, patient is agreeable to proceed. Consent signed and in chart.  Thank you for this interesting consult.  I greatly enjoyed meeting Ernest Wu and look forward to participating in their care.  A copy of this report was sent to the requesting provider on this date.  Electronically Signed: Lavonia Drafts, PA-C 02/10/2019, 7:46 AM   I spent a total of 40 Minutes    in face to face in clinical consultation, greater than 50% of which was counseling/coordinating care for tunneled peritoneal pluerx drain

## 2019-02-10 NOTE — Plan of Care (Signed)
  Problem: Education: Goal: Knowledge of General Education information will improve Description: Including pain rating scale, medication(s)/side effects and non-pharmacologic comfort measures 02/10/2019 1609 by Rance Muir, RN Outcome: Progressing 02/10/2019 1313 by Rance Muir, RN Outcome: Progressing   Problem: Health Behavior/Discharge Planning: Goal: Ability to manage health-related needs will improve 02/10/2019 1609 by Rance Muir, RN Outcome: Progressing 02/10/2019 1313 by Rance Muir, RN Outcome: Progressing   Problem: Clinical Measurements: Goal: Ability to maintain clinical measurements within normal limits will improve 02/10/2019 1609 by Rance Muir, RN Outcome: Progressing 02/10/2019 1313 by Rance Muir, RN Outcome: Progressing Goal: Will remain free from infection 02/10/2019 1609 by Rance Muir, RN Outcome: Progressing 02/10/2019 1313 by Rance Muir, RN Outcome: Progressing Goal: Diagnostic test results will improve 02/10/2019 1609 by Rance Muir, RN Outcome: Progressing 02/10/2019 1313 by Rance Muir, RN Outcome: Progressing Goal: Respiratory complications will improve 02/10/2019 1609 by Rance Muir, RN Outcome: Progressing 02/10/2019 1313 by Rance Muir, RN Outcome: Progressing Goal: Cardiovascular complication will be avoided 02/10/2019 1609 by Rance Muir, RN Outcome: Progressing 02/10/2019 1313 by Rance Muir, RN Outcome: Progressing   Problem: Activity: Goal: Risk for activity intolerance will decrease 02/10/2019 1609 by Rance Muir, RN Outcome: Progressing 02/10/2019 1313 by Rance Muir, RN Outcome: Progressing   Problem: Nutrition: Goal: Adequate nutrition will be maintained 02/10/2019 1609 by Rance Muir, RN Outcome: Progressing 02/10/2019 1313 by Rance Muir, RN Outcome: Progressing   Problem: Elimination: Goal: Will not experience complications related to bowel motility 02/10/2019 1609 by Rance Muir, RN Outcome: Progressing 02/10/2019 1313 by Rance Muir, RN Outcome: Progressing Goal: Will not experience  complications related to urinary retention 02/10/2019 1609 by Rance Muir, RN Outcome: Progressing 02/10/2019 1313 by Rance Muir, RN Outcome: Progressing   Problem: Coping: Goal: Level of anxiety will decrease 02/10/2019 1609 by Rance Muir, RN Outcome: Progressing 02/10/2019 1313 by Rance Muir, RN Outcome: Progressing   Problem: Pain Managment: Goal: General experience of comfort will improve 02/10/2019 1609 by Rance Muir, RN Outcome: Progressing 02/10/2019 1313 by Rance Muir, RN Outcome: Progressing   Problem: Safety: Goal: Ability to remain free from injury will improve 02/10/2019 1609 by Rance Muir, RN Outcome: Progressing 02/10/2019 1313 by Rance Muir, RN Outcome: Progressing   Problem: Skin Integrity: Goal: Risk for impaired skin integrity will decrease 02/10/2019 1609 by Rance Muir, RN Outcome: Progressing 02/10/2019 1313 by Rance Muir, RN Outcome: Progressing

## 2019-02-11 DIAGNOSIS — K56609 Unspecified intestinal obstruction, unspecified as to partial versus complete obstruction: Secondary | ICD-10-CM

## 2019-02-11 DIAGNOSIS — R1084 Generalized abdominal pain: Secondary | ICD-10-CM

## 2019-02-11 LAB — CREATININE, SERUM
Creatinine, Ser: 0.97 mg/dL (ref 0.61–1.24)
GFR calc Af Amer: >60 mL/min (ref >60)
GFR calc non Af Amer: >60 mL/min (ref >60)

## 2019-02-11 MED ORDER — LORAZEPAM 2 MG/ML PO CONC: 1.0000 mg | Freq: Three times a day (TID) | ORAL | 0 refills | Status: AC

## 2019-02-11 MED ORDER — POLYETHYLENE GLYCOL 3350 17 G PO PACK: 17.0000 g | PACK | Freq: Two times a day (BID) | ORAL | 0 refills | Status: AC

## 2019-02-11 MED ORDER — BISACODYL 10 MG RE SUPP: 10.0000 mg | Freq: Every day | RECTAL | 0 refills | Status: AC | PRN

## 2019-02-11 MED ORDER — MORPHINE SULFATE 20 MG/5ML PO SOLN: 5.0000 mg | ORAL | 0 refills | Status: AC | PRN

## 2019-02-11 NOTE — Plan of Care (Signed)
  Problem: Education: Goal: Knowledge of General Education information will improve Description: Including pain rating scale, medication(s)/side effects and non-pharmacologic comfort measures Outcome: Progressing   Problem: Clinical Measurements: Goal: Ability to maintain clinical measurements within normal limits will improve Outcome: Progressing   Problem: Elimination: Goal: Will not experience complications related to bowel motility Outcome: Progressing   Problem: Safety: Goal: Ability to remain free from injury will improve Outcome: Progressing   Problem: Skin Integrity: Goal: Risk for impaired skin integrity will decrease Outcome: Progressing

## 2019-02-11 NOTE — Progress Notes (Signed)
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 Anselm Pancoast. Patient awake and alert sitting in bed. Complains of abdominal pain, stable at this time. Peritoneal catheter site c/d/i.   Allergies: Contrast media [iodinated diagnostic agents] and Iohexol  Medications: Prior to Admission medications   Medication Sig Start Date End Date Taking? Authorizing Provider  ALPRAZolam (XANAX) 0.25 MG tablet Take 0.25 mg by mouth 2 (two) times daily as needed for anxiety.    [provider]  diphenoxylate-atropine (LOMOTIL) 2.5-0.025 MG tablet Take 2 tablets by mouth 4 (four) times daily as needed for diarrhea or loose stools. Patient not taking: Reported on 12/26/2018 06/27/18   Alla Feeling, NP  docusate sodium (COLACE) 100 MG capsule Take 200 mg by mouth daily as needed for mild constipation.    [provider]  esomeprazole (NEXIUM) 20 MG capsule Take 20 mg by mouth daily after breakfast.     [provider]  folic acid (FOLVITE) 1 MG tablet Take 1 tablet (1 mg total) by mouth daily after breakfast. 12/08/18   [name], MD  lactulose (CHRONULAC) 10 GM/15ML solution Take 45 mLs (30 g total) by mouth 3 (three) times daily. Patient taking differently: Take 30 g by mouth 2 (two) times daily as needed for moderate constipation.  02/02/19   Harle Stanford., PA-C  magic mouthwash w/lidocaine SOLN Take 5 mLs by mouth 4 (four) times daily as needed for mouth pain. 08/22/18   Tanner, Lyndon Code., PA-C  Methylcobalamin (B-12) 5000 MCG TBDP Take 5,000 mcg by mouth daily.    [provider]  ondansetron (ZOFRAN) 8 MG tablet Take 1 tablet (8 mg total) by mouth every 8 (eight) hours as needed for nausea or vomiting. 12/22/18   Alla Feeling, NP  oxyCODONE (OXY  IR/ROXICODONE) 5 MG immediate release tablet Take 5 mg by mouth every 4 (four) hours as needed for severe pain.    [provider]  polyethylene glycol (MIRALAX / GLYCOLAX) 17 g packet Take 17 g by mouth daily as needed for mild constipation.     [provider]  prochlorperazine (COMPAZINE) 10 MG tablet Take 1 tablet (10 mg total) by mouth every 6 (six) hours as needed (NAUSEA). 01/19/19   [name], MD  rivaroxaban (XARELTO) 20 MG TABS tablet Take 1 tablet (20 mg total) by mouth daily after breakfast. 01/08/19   [name], MD  simethicone (MYLICON) 712 MG chewable tablet Chew 250 mg by mouth every 6 (six) hours as needed for flatulence.     [provider]  Syringe, Disposable, (10-12CC SYRINGE) 12 ML MISC 10 mLs by Does not apply route as needed. 12/04/18   Alla Feeling, NP  traMADol (ULTRAM) 50 MG tablet Take 50 mg by mouth every 6 (six) hours as needed (pain).     [provider]     Vital Signs: BP (!) 109/96 (BP Location: Left Arm)   Pulse (!) 116   Temp (!) 97.4 F (36.3 C) (Oral)   Resp 17   Ht 6' (1.829 m)   Wt 186 lb 4.6 oz (84.5 kg)   SpO2 97%   BMI 25.27 kg/m   Physical Exam Vitals signs and nursing note reviewed.  Pulmonary:     Effort: Pulmonary effort is normal. No respiratory distress.  Abdominal:  General: There is distension.     Comments: Peritoneal catheter intact, site without tenderness, erythema, drainage, or active bleeding.  Skin:    General: Skin is warm and dry.  Neurological:     Mental Status: He is oriented to person, place, and time.  Psychiatric:        Mood and Affect: Mood normal.        Behavior: Behavior normal.        Thought Content: Thought content normal.        Judgment: Judgment normal.     Imaging: Ir Perc Athena Masse Perit Cath Wo Port  Result Date: 02/10/2019 INDICATION: 58 year old with pancreatic cancer and malignant ascites. Patient has recurrent ascites and abdominal distention. Plan for  tunneled peritoneal catheter placement. EXAM: PLACEMENT OF TUNNELED PERITONEAL CATHETER WITH ULTRASOUND AND FLUOROSCOPIC GUIDANCE MEDICATIONS: Ancef 2 g ANESTHESIA/SEDATION: Fentanyl 100 mcg IV; Versed 2.0 mg IV Moderate Sedation Time:  17 minutes The patient was continuously monitored during the procedure by the interventional radiology nurse under my direct supervision. COMPLICATIONS: None immediate. PROCEDURE: Informed written consent was obtained from the patient after a thorough discussion of the procedural risks, benefits and alternatives. All questions were addressed. Maximal Sterile Barrier Technique was utilized including caps, mask, sterile gowns, sterile gloves, sterile drape, hand hygiene and skin antiseptic. A timeout was performed prior to the initiation of the procedure. Ultrasound demonstrated a large amount of ascites particularly in the mid abdomen and left side of the abdomen. The left side of the abdomen was prepped and draped in sterile fashion. Skin was anesthetized with 1% lidocaine at 2 areas and 2 small incisions were made. Needle was directed into the peritoneal cavity using ultrasound guidance at the more lateral incision and clear yellow ascites was aspirated. Small catheter was left in place. PleurX catheter was tunneled between the 2 incisions and the cuff was placed underneath the skin. These small peritoneal catheter was removed over a stiff Amplatz wire and the tract was dilated to accommodate a peel-away sheath. Catheter was advanced through the peel-away sheath into the peritoneal cavity. 4.8 L of yellow ascites was removed. The peritoneal entrance skin site was closed using absorbable suture and Dermabond. The catheter was sutured to skin with Prolene. Dressing was placed over the catheter. Fluoroscopic and ultrasound images were taken and saved for documentation. FINDINGS: Colonic prosthesis or stent was identified in the sigmoid colon region. Peritoneal catheter is located in  the mid left abdomen. IMPRESSION: Successful placement of a tunneled peritoneal PleurX catheter. 4.8 L of fluid was removed. Electronically Signed   By: Markus Daft M.D.   On: 02/10/2019 14:38   Dg Abd Portable 1v  Result Date: 02/10/2019 CLINICAL DATA:  Abdominal distention. EXAM: PORTABLE ABDOMEN - 1 VIEW COMPARISON:  02/08/2019 and 02/07/2019 and CT scan dated 02/05/2019 FINDINGS: There is air and stool within the colon to the level of the stent in the sigmoid region. Overall density of the abdomen consistent with ascites as previously demonstrated. Liver stent in place. No dilated small bowel. Stomach is not distended. IMPRESSION: Ascites. No significant change. Persistent stool and air in the colon to the level of the sigmoid region. Electronically Signed   By: Lorriane Shire M.D.   On: 02/10/2019 08:11   Dg Abd Portable 1v  Result Date: 02/09/2019 CLINICAL DATA:  Abdominal distention. EXAM: PORTABLE ABDOMEN - 1 VIEW COMPARISON:  02/08/2019. FINDINGS: Surgical clips right upper quadrant. Biliary stent and rectosigmoid stent noted in stable position. Stool in the colon.  Slightly distended loops of small and large bowel are noted most consistent with an adynamic ileus. Follow-up exam suggested in order to demonstrate resolution and to exclude bowel obstruction. No free air. Degenerative changes lumbar spine and both hips. Pelvic calcifications consistent phleboliths. IMPRESSION: Biliary stent and rectosigmoid stent noted in stable position. Slightly distended loops of small and large bowel are noted most consistent adynamic ileus. Follow-up exam suggested in order to demonstrate resolution and to exclude bowel obstruction. Electronically Signed   By: Marcello Moores  Register   On: 02/09/2019 07:20   Dg Abd Portable 1v  Result Date: 02/08/2019 CLINICAL DATA:  Abdominal distension EXAM: PORTABLE ABDOMEN - 1 VIEW COMPARISON:  Multiple previous films.  The most recent is 02/07/2019 FINDINGS: The NG tube is not  visualized and was likely removed. It could have also been pulled back into the esophagus. Recommend clinical correlation. The biliary stent and rectosigmoid stents are stable. There is less contrast in the right colon. Some persistent air distended transverse colon. Scattered air-filled small bowel loops but no distension. No definite free air. IMPRESSION: Some interval passage of colonic contrast. No findings for small bowel obstruction or free air. The NG tube is no longer visualized. Electronically Signed   By: Marijo Sanes M.D.   On: 02/08/2019 09:01    Labs:  CBC: Recent Labs    02/07/19 0414 02/08/19 0816 02/08/19 2352 02/10/19 0401  WBC 10.2 10.1 11.1* 10.3  HGB 12.4* 11.7* 12.4* 11.9*  HCT 38.6* 35.5* 37.3* 36.6*  PLT 499* 420* 463* 414*    COAGS: Recent Labs    07/15/18 1140 11/10/18 0810 11/21/18 0453 11/21/18 1513 02/08/19 2352  INR 0.93 0.9 1.1  --  1.1  APTT  --   --   --  33  --     BMP: Recent Labs    02/07/19 0414 02/08/19 0816 02/08/19 2352 02/10/19 0401 02/11/19 0443  NA 134* 135 131* 134*  --   K 4.1 3.8 3.9 4.6  --   CL 99 102 100 99  --   CO2 25 24 21* 25  --   GLUCOSE 122* 125* 139* 115*  --   BUN 8 9 11 14   --   CALCIUM 8.6* 8.3* 8.4* 8.8*  --   CREATININE 0.86 0.77 0.82 0.83 0.97  GFRNONAA >60 >60 >60 >60 >60  GFRAA >60 >60 >60 >60 >60    LIVER FUNCTION TESTS: Recent Labs    01/26/19 1020 02/01/19 1320 02/04/19 2212 02/05/19 0515  BILITOT <0.2* 0.1* 0.8 0.5  AST 36 24 29 25   ALT 76* 39 41 35  ALKPHOS 142* 116 100 93  PROT 6.9 6.7 6.1* 5.3*  ALBUMIN 3.3* 3.1* 2.8* 2.3*    Assessment and Plan:  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 Anselm Pancoast. Peritoneal catheter stable and ready for use. Please call IR with questions/concerns.   Electronically Signed: Earley Abide, PA-C 02/11/2019, 10:47 AM   I spent a total of 15 Minutes at the the patient's bedside  AND on the patient's hospital floor or unit, greater than 50% of which was counseling/coordinating care for malignant ascites s/p tunneled peritoneal catheter placement.

## 2019-02-11 NOTE — Progress Notes (Signed)
[facility] Progress Note  5 Days Post-Op  Subjective: CC-  Felt better after 4.8L ascites drained yesterday. Unfortunately he still has not had any return in bowel function. No BM or flatus. He vomited this morning.  Met with palliative team yesterday and has decided to go home with hospice today.  Objective: Vital signs in last 24 hours: Temp:  [97.4 F (36.3 C)] 97.4 F (36.3 C) (07/07 1602) Pulse Rate:  [88-116] 116 (07/08 0318) Resp:  [10-26] 17 (07/08 0318) BP: (109-153)/(75-115) 109/96 (07/08 0318) SpO2:  [97 %-100 %] 97 % (07/08 0318) Last BM Date: 02/07/19  Intake/Output from previous day: 07/07 0701 - 07/08 0700 In: 120 [P.O.:120] Out: -  Intake/Output this shift: No intake/output data recorded.  PE: Gen:  Alert, NAD, pleasant HEENT: EOM's intact, pupils equal and round effort normal Abd: distended, firm but less so than yesterday, peritoneal catheter in place, nontender, few BS heard Ext:  No BLE edema Psych: A&Ox3  Skin: no rashes noted, warm and dry   Lab Results:  Recent Labs    02/08/19 2352 02/10/19 0401  WBC 11.1* 10.3  HGB 12.4* 11.9*  HCT 37.3* 36.6*  PLT 463* 414*   BMET Recent Labs    02/08/19 2352 02/10/19 0401 02/11/19 0443  NA 131* 134*  --   K 3.9 4.6  --   CL 100 99  --   CO2 21* 25  --   GLUCOSE 139* 115*  --   BUN 11 14  --   CREATININE 0.82 0.83 0.97  CALCIUM 8.4* 8.8*  --    PT/INR Recent Labs    02/08/19 2352  LABPROT 14.2  INR 1.1   CMP     Component Value Date/Time   NA 134 (L) 02/10/2019 0401   K 4.6 02/10/2019 0401   CL 99 02/10/2019 0401   CO2 25 02/10/2019 0401   GLUCOSE 115 (H) 02/10/2019 0401   BUN 14 02/10/2019 0401   CREATININE 0.97 02/11/2019 0443   CREATININE 0.79 01/26/2019 1020   CALCIUM 8.8 (L) 02/10/2019 0401   PROT 5.3 (L) 02/05/2019 0515   ALBUMIN 2.3 (L) 02/05/2019 0515   AST 25 02/05/2019 0515   AST 36 01/26/2019 1020   ALT 35 02/05/2019 0515   ALT 76 (H) 01/26/2019  1020   ALKPHOS 93 02/05/2019 0515   BILITOT 0.5 02/05/2019 0515   BILITOT <0.2 (L) 01/26/2019 1020   GFRNONAA >60 02/11/2019 0443   GFRNONAA >60 01/26/2019 1020   GFRAA >60 02/11/2019 0443   GFRAA >60 01/26/2019 1020   Lipase     Component Value Date/Time   LIPASE 19 02/01/2019 1320       Studies/Results: Ir Perc Athena Masse Perit Cath Wo Port  Result Date: 02/10/2019 INDICATION: 58 year old with pancreatic cancer and malignant ascites. Patient has recurrent ascites and abdominal distention. Plan for tunneled peritoneal catheter placement. EXAM: PLACEMENT OF TUNNELED PERITONEAL CATHETER WITH ULTRASOUND AND FLUOROSCOPIC GUIDANCE MEDICATIONS: Ancef 2 g ANESTHESIA/SEDATION: Fentanyl 100 mcg IV; Versed 2.0 mg IV Moderate Sedation Time:  17 minutes The patient was continuously monitored during the procedure by the interventional radiology nurse under my direct supervision. COMPLICATIONS: None immediate. PROCEDURE: Informed written consent was obtained from the patient after a thorough discussion of the procedural risks, benefits and alternatives. All questions were addressed. Maximal Sterile Barrier Technique was utilized including caps, mask, sterile gowns, sterile gloves, sterile drape, hand hygiene and skin antiseptic. A timeout was performed prior to the initiation of the procedure. Ultrasound  demonstrated a large amount of ascites particularly in the mid abdomen and left side of the abdomen. The left side of the abdomen was prepped and draped in sterile fashion. Skin was anesthetized with 1% lidocaine at 2 areas and 2 small incisions were made. Needle was directed into the peritoneal cavity using ultrasound guidance at the more lateral incision and clear yellow ascites was aspirated. Small catheter was left in place. PleurX catheter was tunneled between the 2 incisions and the cuff was placed underneath the skin. These small peritoneal catheter was removed over a stiff Amplatz wire and the tract was  dilated to accommodate a peel-away sheath. Catheter was advanced through the peel-away sheath into the peritoneal cavity. 4.8 L of yellow ascites was removed. The peritoneal entrance skin site was closed using absorbable suture and Dermabond. The catheter was sutured to skin with Prolene. Dressing was placed over the catheter. Fluoroscopic and ultrasound images were taken and saved for documentation. FINDINGS: Colonic prosthesis or stent was identified in the sigmoid colon region. Peritoneal catheter is located in the mid left abdomen. IMPRESSION: Successful placement of a tunneled peritoneal PleurX catheter. 4.8 L of fluid was removed. Electronically Signed   By: Markus Daft M.D.   On: 02/10/2019 14:38   Dg Abd Portable 1v  Result Date: 02/10/2019 CLINICAL DATA:  Abdominal distention. EXAM: PORTABLE ABDOMEN - 1 VIEW COMPARISON:  02/08/2019 and 02/07/2019 and CT scan dated 02/05/2019 FINDINGS: There is air and stool within the colon to the level of the stent in the sigmoid region. Overall density of the abdomen consistent with ascites as previously demonstrated. Liver stent in place. No dilated small bowel. Stomach is not distended. IMPRESSION: Ascites. No significant change. Persistent stool and air in the colon to the level of the sigmoid region. Electronically Signed   By: Lorriane Shire M.D.   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 Kae Heller. Unfortunately he has still had no return in bowel function despite colonic stent and paracentesis. Concerned that he may have some point of obstruction higher up in his GI tract. We discussed several options including diverting colostomy, but this would require a longer hospital stay and the risks of surgery including difficult wound healing given his amount of ascites; it may also not resolve his obstruction. We also discussed a venting G-tube for symptomatic management of his nausea/vomiting, but this would also be a procedure to go through and possibly delay his going home. The other option would be to just treat his symptoms of pain, nausea, and vomiting with medication at home with hospice. Currently Mr. Weekes wants to go home with hospice and avoid any procedures. He is ok for discharge from surgical standpoint. He knows that if he changes his mind we are here and we are happy to  discuss options again.  ID -none currently VTE -SCDs, per primary FEN -IVF, CLD Foley -none Follow up -TBD Contact - wife Ulysse Siemen [phone]    LOS: 7 days    Wellington Hampshire , [facility] 02/11/2019, 8:13 AM Pager: [phone] Mon-Thurs 7:00 am-4:30 pm Fri 7:00 am -11:30 AM Sat-Sun 7:00 am-11:30 am

## 2019-02-11 NOTE — Progress Notes (Addendum)
Patient ID: Ernest Wu, male   DOB: 12/18/1960, 58 y.o.   MRN: [id number] Request received for venting G tube placement in pt. Imaging studies were reviewed by Dr. Earleen Newport and case d/w multiple IR MD's. G tube placement contraindicated in setting of ascites and peritoneal carcinomatosis due to increased risk of leaking at insertion site and infection. Could give pt trial of NG tube to see if helps with N/V but apparently pt has decided to go home with hospice and avoid any additional procedures.

## 2019-02-11 NOTE — Progress Notes (Signed)
Discharge instructions given with stated understanding.  Patient waiting for transportation home at this time 

## 2019-02-11 NOTE — Discharge Summary (Signed)
Physician Discharge Summary  TREVANTE TENNELL BHA:[id number] DOB: 10-24-1960 DOA: 02/04/2019  PCP: [name], [name], MD  Admit date: 02/04/2019 Discharge date: 02/11/2019  Recommendations for Outpatient Follow-up:  1. Hospice care at home  Discharge Diagnoses: Principal diagnosis is #1 1. Colonic obstruction 2. Pancreatic adenocarcinoma with peritoneal carcinomatosis 3. Recurrent malignant ascites 4. Chronic anticoagulation 5. Tobacco use disorder 6. Anxiety/insomnia  Discharge Condition: Poor Disposition: Home with hospice  Diet recommendation: As tolerated. Pleasure feeds.  Filed Weights   02/04/19 2254 02/06/19 0733  Weight: 84.5 kg 84.5 kg    History of present illness:   LIBERO PUTHOFF is a 58 y.o. male with medical history significant of pancreatic cancer, GERD, tobacco abuse, previous biliary stent, cholecystectomy who has been having Abdominal pain with nausea and vomiting for a while now.  Previous studies indicated that patient has possible sigmoid or descending colon obstruction.  Patient was seen by oncology today and there was discussion with Dr. Therisa Doyne of GI with recommendation for CT pelvis with rectal contrast and possibly consider stenting based on the CT findings.  Patient has continued to have nausea vomiting abdominal pain.  He is unable to keep anything down.  He therefore came to the ER for further evaluation.  He still retching with significant pain 8 out of 10.  Has significant abdominal pain with distention.  Patient had paracentesis 2 days ago.  He is being admitted for intractable nausea vomiting but also evaluation of his intestinal obstruction.  Hospital Course:  58 year old male with history of GERD, tobacco use disorder, previous biliary stent, previous cholecystectomy and pancreatic cancer on chemotherapy admitted 02/04/2019 with abdominal pain, nausea and emesis and found to have colonic obstruction with cecal dilation to 10.3 cm from stricture at  rectosigmoid junction from metastatic pancreatic adenocarcinoma with carcinomatosis and malignant ascites.  NGT placed. Patient underwent colonoscopy with colonic stent/prosthesis to distal sigmoid colon on 02/06/2019 by Dr. Watt Climes.   Patient and wife are decided to go with palliative/hospice care post discharge. No further therapeutic chemotherapy per oncology, Dr. Burr Medico.  This morning the patient was vomiting feculant material and was in considerable discomfort. I discussed the patient with surgery. It seems that the patient is unable to have palliative diverting colostomy, because there is both an upper and a lower obstruction that is not amenable to this procedure. It is also the case that the patient will likely not be able to heal from any procedure such as this. However it was thought that he may benefit from NGT for venting the stomach. IR was consulted, but they felt that the patient was not a candidate for this procedure due to his carcinomatosis, his massive ascites, and his poor nutritional status. It was felt that he was at high risk for infection and unlikely to heal.  I have discussed all of this with the patient's wife. The patient will unfortunately be discharged to home with hospice, pain control, as well as lactulose, docusate, and bisacodyl suppositories and antiemetics. He will be discharged to home with hospice today.  Today's assessment: S: The patient is dressed and sittng up at bedside. He is nauseated.  O: Vitals:  Vitals:   02/10/19 2128 02/11/19 0318  BP: 118/87 (!) 109/96  Pulse: (!) 104 (!) 116  Resp: 16 17  Temp:    SpO2: 99% 97%    Constitutional:   The patient is awake, alert, and oriented x 3. No acute distress. Respiratory:   No increased work of breathing.  No  wheezes, rales, or rhonchi  No tactile fremitus. Cardiovascular:   Regular rate and rhythm  No murmurs, ectopy, or gallups.  No lateral PMI. No thrills. Abdomen:   Abdomen is  distended, taut, and diffusely tender.  I am unable to evaluate the abdomen for organomegaly, masses, or hernias.  Hypoactive bowel sounds. Musculoskeletal:   No cyanosis, clubbing, or edema Skin:   No rashes, lesions, ulcers  palpation of skin: no induration or nodules Neurologic:   CN 2-12 intact  Sensation all 4 extremities intact Psychiatric:   judgement and insight appear normal  Mental status o Mood, affect appropriate o Orientation to person, place, time   Discharge Instructions  Discharge Instructions    Activity as tolerated - No restrictions   Complete by: As directed    Call MD for:  persistant nausea and vomiting   Complete by: As directed    Call MD for:  severe uncontrolled pain   Complete by: As directed    Diet general   Complete by: As directed    Pleasure feeds.   Discharge instructions   Complete by: As directed    Activity as tolerated. Follow up with hospice as outpatient. Follow up with PCP in 7-10 days.   Increase activity slowly   Complete by: As directed      Allergies as of 02/11/2019      Reactions   Contrast Media [iodinated Diagnostic Agents] Other (See Comments)   Burning sensation [SEE CONTRAST MEDIA]   Iohexol Anaphylaxis, Itching, Rash      Medication List    STOP taking these medications   10-12CC SYRINGE 12 ML Misc   ALPRAZolam 0.25 MG tablet Commonly known as: XANAX   diphenoxylate-atropine 2.5-0.025 MG tablet Commonly known as: LOMOTIL   docusate sodium 100 MG capsule Commonly known as: COLACE   oxyCODONE 5 MG immediate release tablet Commonly known as: Oxy IR/ROXICODONE   prochlorperazine 10 MG tablet Commonly known as: COMPAZINE   rivaroxaban 20 MG Tabs tablet Commonly known as: Xarelto   simethicone 125 MG chewable tablet Commonly known as: MYLICON   traMADol 50 MG tablet Commonly known as: ULTRAM     TAKE these medications   B-12 5000 MCG Tbdp Take 5,000 mcg by mouth daily.   bisacodyl 10  MG suppository Commonly known as: DULCOLAX Place 1 suppository (10 mg total) rectally daily as needed for moderate constipation or severe constipation.   esomeprazole 20 MG capsule Commonly known as: NEXIUM Take 20 mg by mouth daily after breakfast.   folic acid 1 MG tablet Commonly known as: FOLVITE Take 1 tablet (1 mg total) by mouth daily after breakfast.   lactulose 10 GM/15ML solution Commonly known as: CHRONULAC Take 45 mLs (30 g total) by mouth 3 (three) times daily. What changed:   when to take this  reasons to take this   LORazepam 2 MG/ML concentrated solution Commonly known as: LORazepam Intensol Take 0.5 mLs (1 mg total) by mouth every 8 (eight) hours.   magic mouthwash w/lidocaine Soln Take 5 mLs by mouth 4 (four) times daily as needed for mouth pain.   morphine 20 MG/5ML solution Take 1.3-2.5 mLs (5.2-10 mg total) by mouth every 2 (two) hours as needed for pain.   ondansetron 8 MG tablet Commonly known as: ZOFRAN Take 1 tablet (8 mg total) by mouth every 8 (eight) hours as needed for nausea or vomiting.   polyethylene glycol 17 g packet Commonly known as: MIRALAX / GLYCOLAX Take 17 g by mouth  2 (two) times daily. What changed:   when to take this  reasons to take this      Allergies  Allergen Reactions   Contrast Media [Iodinated Diagnostic Agents] Other (See Comments)    Burning sensation [SEE CONTRAST MEDIA]   Iohexol Anaphylaxis, Itching and Rash    The results of significant diagnostics from this hospitalization (including imaging, microbiology, ancillary and laboratory) are listed below for reference.    Significant Diagnostic Studies: Ct Abdomen Pelvis Wo Contrast  Result Date: 02/05/2019 CLINICAL DATA:  Bowel obstruction EXAM: CT ABDOMEN AND PELVIS WITHOUT CONTRAST TECHNIQUE: Multidetector CT imaging of the abdomen and pelvis was performed following the standard protocol without IV contrast. COMPARISON:  Radiograph 02/04/2019, CT  02/01/2019, 12/26/2018, 12/10/2018 FINDINGS: Lower chest: Lung bases demonstrate no acute consolidation or effusion. Stable nodular foci at the right middle lobe. Heart size upper normal. Esophageal tube within the distal esophagus, the tip terminates at the distal stomach. Hepatobiliary: No focal hepatic abnormality. Small amount of pneumobilia. Distal common duct stent. Pancreas: Atrophic.  No ductal dilatation Spleen: Normal in size without focal abnormality. Adrenals/Urinary Tract: Adrenal glands are unremarkable. Kidneys are normal, without renal calculi, focal lesion, or hydronephrosis. Bladder is slightly thick walled. Stomach/Bowel: The stomach is nonenlarged. There is no dilated small bowel. Diffusely enlarged colon, contains contrast. Cecal dilatation up 10.3 cm. This appears increased. Transverse colon dilated up to 7.1 cm. Irregular decompressed appearance of the rectosigmoid colon. Small amount of contrast within the rectum. Reflux contrast into slightly dilated distal small bowel. Vascular/Lymphatic: Mild aortic atherosclerosis without aneurysm. Reproductive: Prostate is unremarkable. Other: No free air. Moderate volume of ascites within the abdomen with small amount of ascites in the pelvis. Diffuse nodular infiltration of the omentum suspicious for carcinomatosis. Fat within the inguinal canals. Musculoskeletal: No acute or suspicious osseous abnormality. IMPRESSION: 1. Marked dilatation of the colon with cecal dilatation up to 10.3 cm, appears increased as compared with prior CT. Irregular collapsed segment of rectosigmoid colon, raises possibility of stricture. Contrast was administered rectally and did manage to pass this segment of bowel into the upstream colon. 2. Moderate ascites within the abdomen with nodular infiltration of the omentum and mesentery, concerning for carcinomatosis. 3. Small amount of pneumobilia, presumably related to distal common duct stent. Electronically Signed   By: Donavan Foil M.D.   On: 02/05/2019 04:05   Ct Abdomen Pelvis Wo Contrast  Result Date: 02/01/2019 CLINICAL DATA:  Abdominal distension, constipation, nausea, symptoms worse for 1 week, recent paracentesis, history pancreatic cancer, smoker EXAM: CT ABDOMEN AND PELVIS WITHOUT CONTRAST TECHNIQUE: Multidetector CT imaging of the abdomen and pelvis was performed following the standard protocol without IV contrast. Sagittal and coronal MPR images reconstructed from axial data set. Patient drank dilute oral contrast for exam COMPARISON:  12/26/2018 FINDINGS: Lower chest: Ill-defined nodular density LEFT lower lobe 4 mm diameter image 15 unchanged. Additional tiny subpleural density LEFT lower lobe laterally adjacent to major fissure image 14 unchanged. Hepatobiliary: Pneumobilia consistent likely due to noted distal CBD wall stent. Gallbladder surgically absent. No focal hepatic mass lesions. Pancreas: Atrophic pancreas. Fullness of pancreatic head versus body though no discrete mass or pancreatic ductal dilatation is seen. Spleen: Normal appearance Adrenals/Urinary Tract: Adrenal glands normal appearance. Kidneys, ureters, and bladder normal appearance Stomach/Bowel: Distended stomach. Diffuse colonic distension attending stool, with contrast in the RIGHT colon. Cecum 8.7 cm transverse. Rectosigmoid colon nondistended. Nonobstructed small bowel loops, oral contrast in RIGHT colon. Appendix not visualized. No definite bowel wall thickening.  Vascular/Lymphatic: Atherosclerotic calcifications Reproductive: Mild prostatic enlargement. Seminal vesicles unremarkable. Other: Scattered ascites. Diffuse omental stranding increased from previous exam. Areas of thickening and slight nodularity at peritoneal surfaces. Findings are consistent with peritoneal carcinomatosis. BILATERAL inguinal hernias containing fat. No free air. Tiny umbilical hernia containing fat. Interloop fluid. Musculoskeletal: No acute osseous lesions.  IMPRESSION: Increased ascites. Increased omental infiltration with scattered areas of thickening and slight nodularity a peritoneal surfaces in the pelvis, consistent with peritoneal carcinomatosis. Mild colonic distention proximally without discrete obstruction though the sigmoid colon and rectum appear decompressed and there appear to be adjacent pelvic tumor deposits. Prior CBD stenting. Electronically Signed   By: Lavonia Dana M.D.   On: 02/01/2019 15:28   Dg Abd 1 View  Result Date: 02/06/2019 CLINICAL DATA:  Sigmoid stent placement. EXAM: ABDOMEN - 1 VIEW; DG C-ARM 61-120 MIN COMPARISON:  CT abdomen pelvis from yesterday. FLUOROSCOPY TIME:  1 minutes, 26 seconds. C-arm fluoroscopic images were obtained intraoperatively and submitted for post operative interpretation. FINDINGS: Intraprocedural fluoroscopic images demonstrate interval placement of a sigmoid colonic stent. Prominent narrowing of the mid stent. IMPRESSION: 1. Intraprocedural fluoroscopic guidance for sigmoid colon stent placement. Electronically Signed   By: Titus Dubin M.D.   On: 02/06/2019 12:11   US Paracentesis  Result Date: 02/02/2019 INDICATION: Patient with history of metastatic pancreatic cancer, recurrent ascites. Request made for therapeutic paracentesis. EXAM: ULTRASOUND GUIDED THERAPEUTIC PARACENTESIS MEDICATIONS: None COMPLICATIONS: None immediate. PROCEDURE: Informed written consent was obtained from the patient after a discussion of the risks, benefits and alternatives to treatment. A timeout was performed prior to the initiation of the procedure. Initial ultrasound scanning demonstrates a small to moderate amount of ascites within the left lower abdominal quadrant. The left lower abdomen was prepped and draped in the usual sterile fashion. 1% lidocaine was used for local anesthesia. Following this, a 19 gauge, 7-cm, Yueh catheter was introduced. An ultrasound image was saved for documentation purposes. The paracentesis  was performed. The catheter was removed and a dressing was applied. The patient tolerated the procedure well without immediate post procedural complication. FINDINGS: A total of approximately 1.9 liters of slightly hazy, yellow fluid was removed. IMPRESSION: Successful ultrasound-guided therapeutic paracentesis yielding 1.9 liters of peritoneal fluid. Read by: Rowe Robert, PA-C Electronically Signed   By: Sandi Mariscal M.D.   On: 02/02/2019 17:04   US Paracentesis  Result Date: 01/30/2019 INDICATION: Pancreatic cancer with ascites. Request for diagnostic and therapeutic paracentesis. EXAM: ULTRASOUND GUIDED PARACENTESIS MEDICATIONS: 1% lidocaine 10 mL COMPLICATIONS: None immediate. PROCEDURE: Informed written consent was obtained from the patient after a discussion of the risks, benefits and alternatives to treatment. A timeout was performed prior to the initiation of the procedure. Initial ultrasound scanning demonstrates a moderate amount of ascites within the right lower abdominal quadrant. The right lower abdomen was prepped and draped in the usual sterile fashion. 1% lidocaine with epinephrine was used for local anesthesia. Following this, a 19 gauge, 7-cm, Yueh catheter was introduced. An ultrasound image was saved for documentation purposes. The paracentesis was performed. The catheter was removed and a dressing was applied. The patient tolerated the procedure well without immediate post procedural complication. FINDINGS: A total of approximately 2.5 L of clear yellow fluid was removed. Samples were sent to the laboratory as requested by the clinical team. IMPRESSION: Successful ultrasound-guided paracentesis yielding 2.5 liters of peritoneal fluid. Read by: Gareth Eagle PA-C Electronically Signed   By: Jerilynn Mages.  Shick M.D.   On: 01/30/2019 16:46   Ir  Perc Tun Perit Cath Wo Port  Result Date: 02/10/2019 INDICATION: 58 year old with pancreatic cancer and malignant ascites. Patient has recurrent ascites and  abdominal distention. Plan for tunneled peritoneal catheter placement. EXAM: PLACEMENT OF TUNNELED PERITONEAL CATHETER WITH ULTRASOUND AND FLUOROSCOPIC GUIDANCE MEDICATIONS: Ancef 2 g ANESTHESIA/SEDATION: Fentanyl 100 mcg IV; Versed 2.0 mg IV Moderate Sedation Time:  17 minutes The patient was continuously monitored during the procedure by the interventional radiology nurse under my direct supervision. COMPLICATIONS: None immediate. PROCEDURE: Informed written consent was obtained from the patient after a thorough discussion of the procedural risks, benefits and alternatives. All questions were addressed. Maximal Sterile Barrier Technique was utilized including caps, mask, sterile gowns, sterile gloves, sterile drape, hand hygiene and skin antiseptic. A timeout was performed prior to the initiation of the procedure. Ultrasound demonstrated a large amount of ascites particularly in the mid abdomen and left side of the abdomen. The left side of the abdomen was prepped and draped in sterile fashion. Skin was anesthetized with 1% lidocaine at 2 areas and 2 small incisions were made. Needle was directed into the peritoneal cavity using ultrasound guidance at the more lateral incision and clear yellow ascites was aspirated. Small catheter was left in place. PleurX catheter was tunneled between the 2 incisions and the cuff was placed underneath the skin. These small peritoneal catheter was removed over a stiff Amplatz wire and the tract was dilated to accommodate a peel-away sheath. Catheter was advanced through the peel-away sheath into the peritoneal cavity. 4.8 L of yellow ascites was removed. The peritoneal entrance skin site was closed using absorbable suture and Dermabond. The catheter was sutured to skin with Prolene. Dressing was placed over the catheter. Fluoroscopic and ultrasound images were taken and saved for documentation. FINDINGS: Colonic prosthesis or stent was identified in the sigmoid colon region.  Peritoneal catheter is located in the mid left abdomen. IMPRESSION: Successful placement of a tunneled peritoneal PleurX catheter. 4.8 L of fluid was removed. Electronically Signed   By: Markus Daft M.D.   On: 02/10/2019 14:38   Dg Abd Acute W/chest  Result Date: 02/04/2019 CLINICAL DATA:  Abdominal pain EXAM: DG ABDOMEN ACUTE W/ 1V CHEST COMPARISON:  CT [id number] FINDINGS: Metallic stent noted in the right abdomen, likely biliary. Marked gaseous distention of the transverse colon with air-fluid levels. Pneumobilia noted. Prior cholecystectomy. No free air organomegaly. Heart is normal size. Lungs clear. No effusions or acute bony abnormality. IMPRESSION: Prior cholecystectomy with biliary stent and pneumobilia. Marked gaseous distention of the colon, most notable in the transverse colon with air-fluid levels, similar to prior CT. Electronically Signed   By: Rolm Baptise M.D.   On: 02/04/2019 22:50   Dg Abd Portable 1v  Result Date: 02/10/2019 CLINICAL DATA:  Abdominal distention. EXAM: PORTABLE ABDOMEN - 1 VIEW COMPARISON:  02/08/2019 and 02/07/2019 and CT scan dated 02/05/2019 FINDINGS: There is air and stool within the colon to the level of the stent in the sigmoid region. Overall density of the abdomen consistent with ascites as previously demonstrated. Liver stent in place. No dilated small bowel. Stomach is not distended. IMPRESSION: Ascites. No significant change. Persistent stool and air in the colon to the level of the sigmoid region. Electronically Signed   By: Lorriane Shire M.D.   On: 02/10/2019 08:11   Dg Abd Portable 1v  Result Date: 02/09/2019 CLINICAL DATA:  Abdominal distention. EXAM: PORTABLE ABDOMEN - 1 VIEW COMPARISON:  02/08/2019. FINDINGS: Surgical clips right upper quadrant. Biliary stent and rectosigmoid  stent noted in stable position. Stool in the colon. Slightly distended loops of small and large bowel are noted most consistent with an adynamic ileus. Follow-up exam suggested in  order to demonstrate resolution and to exclude bowel obstruction. No free air. Degenerative changes lumbar spine and both hips. Pelvic calcifications consistent phleboliths. IMPRESSION: Biliary stent and rectosigmoid stent noted in stable position. Slightly distended loops of small and large bowel are noted most consistent adynamic ileus. Follow-up exam suggested in order to demonstrate resolution and to exclude bowel obstruction. Electronically Signed   By: Marcello Moores  Register   On: 02/09/2019 07:20   Dg Abd Portable 1v  Result Date: 02/08/2019 CLINICAL DATA:  Abdominal distension EXAM: PORTABLE ABDOMEN - 1 VIEW COMPARISON:  Multiple previous films.  The most recent is 02/07/2019 FINDINGS: The NG tube is not visualized and was likely removed. It could have also been pulled back into the esophagus. Recommend clinical correlation. The biliary stent and rectosigmoid stents are stable. There is less contrast in the right colon. Some persistent air distended transverse colon. Scattered air-filled small bowel loops but no distension. No definite free air. IMPRESSION: Some interval passage of colonic contrast. No findings for small bowel obstruction or free air. The NG tube is no longer visualized. Electronically Signed   By: Marijo Sanes M.D.   On: 02/08/2019 09:01   Dg Abd Portable 1v  Result Date: 02/07/2019 CLINICAL DATA:  Abdominal distension. EXAM: PORTABLE ABDOMEN - 1 VIEW COMPARISON:  Radiograph of February 04, 2019. FINDINGS: Distal tip of nasogastric tube is seen in the stomach. Biliary stent is noted and unchanged in position. There is been interval placement of sigmoid colon stent. Mild dilatation of right and transverse colon is noted which is air-filled. No small bowel dilatation is noted. IMPRESSION: Interval placement of sigmoid colon stent. Stable position of biliary stent and nasogastric tube. Stable dilatation of proximal colon is noted. Electronically Signed   By: Marijo Conception M.D.   On: 02/07/2019  09:42   Dg Abd Portable 1 View  Result Date: 02/04/2019 CLINICAL DATA:  NG tube placement EXAM: PORTABLE ABDOMEN - 1 VIEW COMPARISON:  02/04/2019 FINDINGS: NG tube has been placed with the tip in the distal stomach. Gaseous distention of the colon, predominantly transverse colon again noted, unchanged. IMPRESSION: NG tube tip in the distal stomach. Electronically Signed   By: Rolm Baptise M.D.   On: 02/04/2019 23:49   Dg C-arm 1-60 Min  Result Date: 02/06/2019 CLINICAL DATA:  Sigmoid stent placement. EXAM: ABDOMEN - 1 VIEW; DG C-ARM 61-120 MIN COMPARISON:  CT abdomen pelvis from yesterday. FLUOROSCOPY TIME:  1 minutes, 26 seconds. C-arm fluoroscopic images were obtained intraoperatively and submitted for post operative interpretation. FINDINGS: Intraprocedural fluoroscopic images demonstrate interval placement of a sigmoid colonic stent. Prominent narrowing of the mid stent. IMPRESSION: 1. Intraprocedural fluoroscopic guidance for sigmoid colon stent placement. Electronically Signed   By: Titus Dubin M.D.   On: 02/06/2019 12:11    Microbiology: Recent Results (from the past 240 hour(s))  SARS Coronavirus 2 (CEPHEID - Performed in [place] hospital lab), Hosp Order     Status: None   Collection Time: 02/04/19 11:32 PM   Specimen: Nasopharyngeal Swab  Result Value Ref Range Status   SARS Coronavirus 2 NEGATIVE NEGATIVE Final    Comment: (NOTE) If result is NEGATIVE SARS-CoV-2 target nucleic acids are NOT DETECTED. The SARS-CoV-2 RNA is generally detectable in upper and lower  respiratory specimens during the acute phase of infection. The  lowest  concentration of SARS-CoV-2 viral copies this assay can detect is 250  copies / mL. A negative result does not preclude SARS-CoV-2 infection  and should not be used as the sole basis for treatment or other  patient management decisions.  A negative result may occur with  improper specimen collection / handling, submission of specimen other    than nasopharyngeal swab, presence of viral mutation(s) within the  areas targeted by this assay, and inadequate number of viral copies  (<250 copies / mL). A negative result must be combined with clinical  observations, patient history, and epidemiological information. If result is POSITIVE SARS-CoV-2 target nucleic acids are DETECTED. The SARS-CoV-2 RNA is generally detectable in upper and lower  respiratory specimens dur ing the acute phase of infection.  Positive  results are indicative of active infection with SARS-CoV-2.  Clinical  correlation with patient history and other diagnostic information is  necessary to determine patient infection status.  Positive results do  not rule out bacterial infection or co-infection with other viruses. If result is PRESUMPTIVE POSTIVE SARS-CoV-2 nucleic acids MAY BE PRESENT.   A presumptive positive result was obtained on the submitted specimen  and confirmed on repeat testing.  While 2019 novel coronavirus  (SARS-CoV-2) nucleic acids may be present in the submitted sample  additional confirmatory testing may be necessary for epidemiological  and / or clinical management purposes  to differentiate between  SARS-CoV-2 and other Sarbecovirus currently known to infect humans.  If clinically indicated additional testing with an alternate test  methodology [phone]) is advised. The SARS-CoV-2 RNA is generally  detectable in upper and lower respiratory sp ecimens during the acute  phase of infection. The expected result is Negative. Fact Sheet for Patients:  StrictlyIdeas.no Fact Sheet for Healthcare Providers: BankingDealers.co.za This test is not yet approved or cleared by the [place] FDA and has been authorized for detection and/or diagnosis of SARS-CoV-2 by FDA under an Emergency Use Authorization (EUA).  This EUA will remain in effect (meaning this test can be used) for the duration of  the COVID-19 declaration under Section 564(b)(1) of the Act, 21 U.S.C. section 360bbb-3(b)(1), unless the authorization is terminated or revoked sooner. Performed at [facility], [place] [address], [place], [place] [address]   Novel Coronavirus, NAA (hospital order; send-out to ref lab)     Status: None   Collection Time: 02/06/19  7:37 AM   Specimen: Nasopharyngeal Swab; Respiratory  Result Value Ref Range Status   SARS-CoV-2, NAA NOT DETECTED NOT DETECTED Final    Comment: (NOTE) This test was developed and its performance characteristics determined by [employer]. This test has not been FDA cleared or approved. This test has been authorized by FDA under an Emergency Use Authorization (EUA). This test is only authorized for the duration of time the declaration that circumstances exist justifying the authorization of the emergency use of in vitro diagnostic tests for detection of SARS-CoV-2 virus and/or diagnosis of COVID-19 infection under section 564(b)(1) of the Act, 21 U.S.C. 824MPN-3(I)(1), unless the authorization is terminated or revoked sooner. When diagnostic testing is negative, the possibility of a false negative result should be considered in the context of a patient's recent exposures and the presence of clinical signs and symptoms consistent with COVID-19. An individual without symptoms of COVID-19 and who is not shedding SARS-CoV-2 virus would expect to have a negative (not detected) result in this assay. Performed  At: [facility] [address] [place], [place] [id number]  Rush Farmer MD IY:[id number]    Coronavirus Source NASOPHARYNGEAL  Final    Comment: Performed at [facility], [place] [address], [place], [place] [address]     Labs: Basic Metabolic Panel: Recent Labs  Lab 02/05/19 0515 02/07/19 0414 02/08/19 0816 02/08/19 2352 02/10/19 0401 02/11/19 0443  NA 131* 134* 135 131* 134*  --   K 4.2 4.1 3.8 3.9 4.6  --   CL  95* 99 102 100 99  --   CO2 26 25 24  21* 25  --   GLUCOSE 138* 122* 125* 139* 115*  --   BUN 8 8 9 11 14   --   CREATININE 0.73 0.86 0.77 0.82 0.83 0.97  CALCIUM 8.1* 8.6* 8.3* 8.4* 8.8*  --   MG  --   --   --   --  2.4  --    Liver Function Tests: Recent Labs  Lab 02/04/19 2212 02/05/19 0515  AST 29 25  ALT 41 35  ALKPHOS 100 93  BILITOT 0.8 0.5  PROT 6.1* 5.3*  ALBUMIN 2.8* 2.3*   No results for input(s): LIPASE, AMYLASE in the last 168 hours. No results for input(s): AMMONIA in the last 168 hours. CBC: Recent Labs  Lab 02/04/19 2212 02/05/19 0515 02/07/19 0414 02/08/19 0816 02/08/19 2352 02/10/19 0401  WBC 12.4* 9.0 10.2 10.1 11.1* 10.3  NEUTROABS 9.7*  --   --   --   --   --   HGB 12.9* 11.3* 12.4* 11.7* 12.4* 11.9*  HCT 39.2 34.0* 38.6* 35.5* 37.3* 36.6*  MCV 84.3 84.2 86.4 85.5 84.4 85.5  PLT 467* 419* 499* 420* 463* 414*   Cardiac Enzymes: No results for input(s): CKTOTAL, CKMB, CKMBINDEX, TROPONINI in the last 168 hours. BNP: BNP (last 3 results) Recent Labs    11/18/18 2248  BNP 106.2*    ProBNP (last 3 results) No results for input(s): PROBNP in the last 8760 hours.  CBG: No results for input(s): GLUCAP in the last 168 hours.  Principal Problem:   Intestinal obstruction (HCC) Active Problems:   Jaundice   Tobacco use   Adenocarcinoma of pancreas (HCC)   Nausea & vomiting   Time coordinating discharge: 38 minutes  Signed:        Chiyoko Torrico, DO [facility]  02/11/2019, 3:44 PM

## 2019-02-15 NOTE — Progress Notes (Deleted)
[place]   [phone] [phone]   Clinic Follow up Note   Patient Care Team: Street, [name], MD as PCP - General (Family Medicine) 02/15/2019  CHIEF COMPLAINT:   SUMMARY OF ONCOLOGIC HISTORY: Oncology History Overview Note  Cancer Staging Adenocarcinoma of pancreas [facility]) Staging form: Exocrine Pancreas, AJCC 8th Edition - Clinical stage from 06/10/2018: Stage IB (cT2, cN0, cM0) - Signed by [name], MD on 06/29/2018     Adenocarcinoma of pancreas ([place])  06/07/2018 Imaging   CT AP w Contrast IMPRESSION: 1. There is a low attenuation mass centered around the neck of pancreas which is concerning for neoplasm. Pancreatic adenocarcinoma favored. This results in common bile duct obstruction with mild intrahepatic biliary ductal dilatation. There also is involvement of the portal venous confluence. Further evaluation with nonemergent contrast enhanced MRI of the pancreas is recommended. 2. Small indeterminate low-attenuation structure is noted within segment 4 a of the liver. This could be better addressed at MRI.   06/09/2018 Imaging   MR ABD MRCP  The pancreatic mass involves approximately 40 percent of the main portal vein circumference at the portal splenic venous confluence, with associated mild narrowing of the portal splenic venous confluence. The SMV, splenic vein and main, right and left portal veins remain patent. The celiac trunk and SMA are not involved by the pancreatic mass.  IMPRESSION: 2.3 cm diameter hypoenhancing mass at the junction of the head and neck of pancreas with pancreatic ductal dilatation, likely adenocarcinoma.  Intra and extrahepatic bile duct dilatation with abrupt change in caliber at the mid common bile duct. This could indicate an obstructing mass or stricture.    06/10/2018 Initial Biopsy   Diagnosis PANCREAS, FINE NEEDLE ASPIRATION (SPECIMEN 1 OF 1 COLLECTED 06/10/18): MALIGNANT CELLS CONSISTENT WITH  ADENOCARCINOMA.   06/10/2018 Procedure   IMPRESSION: 1. 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 2. Placement of a metallic biliary stent.   06/10/2018 Procedure   EUS per Dr. Paulita Fujita Impression:  - There was dilation in the common bile duct which measured up to 12 mm. - A mass was identified in the pancreatic head. This was staged T3 N1 Mx by endosonographic criteria. Fine needle aspiration performed. - A few lymph nodes were visualized and measured in the peripancreatic region. - There was no evidence of significant pathology in the left lobe of the liver.   06/10/2018 Cancer Staging   Staging form: Exocrine Pancreas, AJCC 8th Edition - Clinical stage from 06/10/2018: Stage IB (cT2, cN0, cM0) - Signed by [name], MD on 06/29/2018   06/27/2018 Initial Diagnosis   Adenocarcinoma of pancreas ([place])   07/02/2018 Imaging   IMPRESSION: 1. Multiple small pulmonary nodules scattered throughout the lungs measuring up to 7 mm in size. These are nonspecific, but the possibility of metastatic disease should be considered, and close attention at time of routine followups is recommended. 2. In addition, today's study demonstrates new and enlarging low-attenuation lesions in the liver. This is poorly evaluated on today's noncontrast CT examination, but is concerning for potential metastatic disease. Further evaluation with repeat nonemergent MRI of the abdomen with and without IV gadolinium is suggested in the near future to better evaluate these findings. 3. Aortic atherosclerosis, in addition to left main and 2 vessel coronary artery disease. Please note that although the presence of coronary artery calcium documents the presence of coronary artery disease, the severity of this disease and any potential stenosis  cannot be assessed on this non-gated CT examination. Assessment for potential risk factor modification, dietary therapy or  pharmacologic therapy may be warranted, if clinically indicated. 4. Mild aneurysmal dilatation of the ascending thoracic aorta (4.8 cm in diameter). Ascending thoracic aortic aneurysm. Recommend semi-annual imaging followup by CTA or MRA and referral to cardiothoracic surgery if not already obtained. This recommendation follows 2010 ACCF/AHA/AATS/ACR/ASA/SCA/SCAI/SIR/STS/SVM Guidelines for the Diagnosis and Management of Patients With Thoracic Aortic Disease. Circulation. 2010; 121: N165-B903.  Aortic Atherosclerosis (ICD10-I70.0). Aortic aneurysm NOS (ICD10-I71.9).   07/05/2018 - 10/29/2018 Chemotherapy    FOLFIRINOX q2 weeks   07/15/2018 Imaging   07/15/2018 Liver US IMPRESSION: No liver lesions identified with ultrasound. Ultrasound-guided biopsy was not performed. Recommend further characterization for liver lesions with a repeat MRI, with and without contrast.   07/24/2018 Imaging   07/24/2018 MRI Abdomen IMPRESSION: 1. The hypoenhancing mass at the junction of the pancreatic body and head has reduced in size, previously 3.2 by 2.8 cm and currently 2.9 by 2.2 cm. A small peripancreatic lymph node adjacent to the mass was previously 0.9 cm in short axis and is currently 0.7 cm in short axis. The amount of contact between the pancreatic mass and the confluence of the splenic vein and SMV is similar to the prior exam. 2. There is a 6 mm probable hemangioma in segment 4a of the liver, based on the delayed enhancement pattern. This is somewhat ill-defined. The lesion appeared larger on the prior noncontrast CT but presumably may have been overestimated on that exam. There is also some hypodensity along the dome of the right hepatic lobe which appears to most likely be due to a slip of the diaphragm rather than a discernible lesion on MRI. 3. Aortic Atherosclerosis (ICD10-I70.0). Notably, there is a small amount of mural thrombus along the right side of the abdominal  aorta below the right renal artery level which has not been seen previously.   09/03/2018 Imaging   09/03/2018 MRI Abdomen IMPRESSION: 1. Stable mass (adenocarcinoma) in the neck of the pancreas with upstream duct dilatation. 2. No evidence of lymphadenopathy in the porta hepatis or peripancreatic fat. 3. No evidence hepatic metastasis. 4. Mild biliary duct dilatation LEFT hepatic lobe similar to comparison exam. Biliary stent within the common bile duct.   09/16/2018 Imaging   MR MRA CHEST W WO CONTRAST  IMPRESSION: VASCULAR  1. 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 2. Left subclavian port catheter to the SVC. 3. SVC is patent     10/24/2018 Imaging   MRI abdomen  IMPRESSION: 1. Slight interval decrease in size of the pancreatic mass. 2. Stable to slightly smaller adjacent lymph nodes. 3. No findings for metastatic disease involving the liver or lung bases. 4. Common bile duct stent in good position without complicating features.   11/10/2018 Surgery   PAC removal, Laparoscopic Cholecystectomy and Diagnostic Laparoscopic Liver Biopsy by Dre. Byerly  and Dr. Lucia Gaskins  11/10/18    11/10/2018 Pathology Results   Diagnosis 11/10/18 1. Liver, biopsy, Left - ADENOCARCINOMA. - SEE COMMENT. 2. Gallbladder - CHRONIC CHOLECYSTITIS WITH CHOLELITHIASIS. - ONE MORPHOLOGICALLY BENIGN LYMPH NODE.   12/10/2018 Imaging   CT AP  IMPRESSION: 1. Interval resolution of gallbladder fossa abscess. The percutaneous drainage catheter remains in good position. 2. Patent metallic biliary stent with expected pneumobilia. 3. Decreasing size of low-attenuation in hepatic segment 5 adjacent to the gallbladder fossa likely representing a small resolving intrahepatic abscess. 4. Additional ancillary findings  as above without significant interval change.   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  The CustomNext-Expanded gene panel offered by [facility] and includes sequencing and rearrangement analysis for the following 81 genes: AIP, ALK, APC*, ATM*, AXIN2, BAP1, BARD1, BLM, BMPR1A, BRCA1*, BRCA2*, BRIP1*, CDC73, CDH1*, CDK4, CDKN1B, CDKN2A, CHEK2*, CTNNA1, DICER1, FANCC, FH, FLCN, GALNT12, HOXB13, KIT, MAX, MEN1, MET, MLH1*, MRE11A, MSH2*, MSH6*, MUTYH*, NBN, NF1*, NF2, NTHL1, PALB2*, PDGFRA, PHOX2B, PMS2*, POLD1, POLE, POT1, PRKAR1A, PTCH1, PTEN*, RAD50, RAD51C*, RAD51D*, RB1, RET, SDHA, SDHAF2, SDHB, SDHC, SDHD, SMAD4, SMARCA4, SMARCB1, SMARCE1, STK11, SUFU, TMEM127, TP53*, TSC1, TSC2, VHL and XRCC2 (sequencing and deletion/duplication); CASR, CFTR, CPA1, CTRC, EGFR, MITF, PRSS1 and SPINK1 (sequencing only); EPCAM and GREM1 (deletion/duplication only). DNA and RNA analyses performed for * genes. The report date is Dec 19, 2018.   12/22/2018 -  Chemotherapy   second-line chemo with Gemcitabine and Abraxane 2-3 weeks on/1 week off starting 12/22/18 which single agent gemcitabine for cycle 1. Added Abraxane with cycle C1D8.     12/26/2018 Imaging   CT AP IMPRESSION: 1. No evidence of bowel obstruction. 2. New small amount of ascites with increased nodular soft tissue stranding throughout the omental fat. This could be inflammatory and indicate peritonitis. Early peritoneal carcinomatosis would be another explanation. 3. Patent biliary stent and persistent pneumobilia. No residual fluid collection identified in the cholecystectomy bed following percutaneous drain removal.   01/08/2019 Imaging   CT Chest 01/08/19 IMPRESSION: 1. Three small (less than 5 mm) pulmonary nodules in the RIGHT lower lobe are not identified on comparison CT from 07/02/2018 and therefore concerning for metastatic nodules.   2. Mild aneurysmal dilatation of the ascending thoracic aorta. Recommend annual imaging followup by CTA or MRA. This recommendation follows 2010  ACCF/AHA/AATS/ACR/ASA/SCA/SCAI/SIR/STS/SVM Guidelines for the Diagnosis and Management of Patients with Thoracic Aortic Disease. Circulation. 2010; 121: G644-I347. Aortic aneurysm NOS (ICD10-I71.9)     CURRENT THERAPY:   INTERVAL HISTORY:   REVIEW OF SYSTEMS:   Constitutional: Denies fevers, chills or abnormal weight loss Eyes: Denies blurriness of vision Ears, nose, mouth, throat, and face: Denies mucositis or sore throat Respiratory: Denies cough, dyspnea or wheezes Cardiovascular: Denies palpitation, chest discomfort or lower extremity swelling Gastrointestinal:  Denies nausea, heartburn or change in bowel habits Skin: Denies abnormal skin rashes Lymphatics: Denies new lymphadenopathy or easy bruising Neurological:Denies numbness, tingling or new weaknesses Behavioral/Psych: Mood is stable, no new changes  All other systems were reviewed with the patient and are negative.  MEDICAL HISTORY:  Past Medical History:  Diagnosis Date   Anxiety    Family history of breast cancer    Family history of colonic polyps    Family history of pancreatic cancer    Family history of prostate cancer    GERD (gastroesophageal reflux disease)    Intestinal obstruction ([place]) 02/05/2019   pancreatic ca 06/2018   Pancreatic    SURGICAL HISTORY: Past Surgical History:  Procedure Laterality Date   BILIARY STENT PLACEMENT  06/10/2018   Procedure: BILIARY STENT PLACEMENT;  Surgeon: Ronnette Juniper, MD;  Location: [place];  Service: Gastroenterology;;   CHOLECYSTECTOMY N/A 11/10/2018   Procedure: Laparoscopic Cholecystectomy;  Surgeon: Stark Klein, MD;  Location: [place];  Service: General;  Laterality: N/A;   COLONIC STENT PLACEMENT N/A 02/06/2019   Procedure: COLONIC STENT PLACEMENT;  Surgeon: Clarene Essex, MD;  Location: [place];  Service: Endoscopy;  Laterality: N/A;   COLONOSCOPY WITH PROPOFOL N/A 02/06/2019  Procedure: COLONOSCOPY WITH PROPOFOL for  colonic stent placement;   Surgeon: Clarene Essex, MD;  Location: [place];  Service: Endoscopy;  Laterality: N/A;   DIAGNOSTIC LAPAROSCOPIC LIVER BIOPSY N/A 11/10/2018   Procedure: Diagnostic Laparoscopic Liver Biopsy;  Surgeon: Stark Klein, MD;  Location: [place];  Service: General;  Laterality: N/A;   ERCP N/A 06/10/2018   Procedure: ENDOSCOPIC RETROGRADE CHOLANGIOPANCREATOGRAPHY (ERCP);  Surgeon: Ronnette Juniper, MD;  Location: [place];  Service: Gastroenterology;  Laterality: N/A;   ESOPHAGOGASTRODUODENOSCOPY (EGD) WITH PROPOFOL N/A 06/10/2018   Procedure: ESOPHAGOGASTRODUODENOSCOPY (EGD) WITH PROPOFOL;  Surgeon: Ronnette Juniper, MD;  Location: [place];  Service: Gastroenterology;  Laterality: N/A;   FINE NEEDLE ASPIRATION  06/10/2018   Procedure: FINE NEEDLE ASPIRATION (FNA) LINEAR;  Surgeon: Ronnette Juniper, MD;  Location: [facility];  Service: Gastroenterology;;   IR PERC TUN PERIT CATH WO PORT S&I [place]a [facility]  02/10/2019   IR RADIOLOGIST EVAL & MGMT  12/10/2018   PORT-A-CATH REMOVAL N/A 11/10/2018   Procedure: REMOVAL PORT-A-CATH;  Surgeon: Stark Klein, MD;  Location: [place];  Service: General;  Laterality: N/A;   PORTACATH PLACEMENT N/A 07/02/2018   Procedure: INSERTION PORT-A-CATH;  Surgeon: Stark Klein, MD;  Location: [place];  Service: General;  Laterality: N/A;   SPHINCTEROTOMY  06/10/2018   Procedure: SPHINCTEROTOMY;  Surgeon: Ronnette Juniper, MD;  Location: [place];  Service: Gastroenterology;;   UPPER ESOPHAGEAL ENDOSCOPIC ULTRASOUND (EUS) N/A 06/10/2018   Procedure: UPPER ESOPHAGEAL ENDOSCOPIC ULTRASOUND (EUS);  Surgeon: Ronnette Juniper, MD;  Location: [place];  Service: Gastroenterology;  Laterality: N/A;   VASECTOMY      I have reviewed the social history and family history with the patient and they are unchanged from previous note.  ALLERGIES:  is allergic to contrast media [iodinated diagnostic agents] and iohexol.  MEDICATIONS:  Current Outpatient Medications  Medication Sig Dispense Refill    bisacodyl (DULCOLAX) 10 MG suppository Place 1 suppository (10 mg total) rectally daily as needed for moderate constipation or severe constipation. 12 suppository 0   esomeprazole (NEXIUM) 20 MG capsule Take 20 mg by mouth daily after breakfast.      folic acid (FOLVITE) 1 MG tablet Take 1 tablet (1 mg total) by mouth daily after breakfast. 30 tablet 3   lactulose (CHRONULAC) 10 GM/15ML solution Take 45 mLs (30 g total) by mouth 3 (three) times daily. (Patient taking differently: Take 30 g by mouth 2 (two) times daily as needed for moderate constipation. ) 473 mL 2   LORazepam (LORAZEPAM INTENSOL) 2 MG/ML concentrated solution Take 0.5 mLs (1 mg total) by mouth every 8 (eight) hours. 30 mL 0   magic mouthwash w/lidocaine SOLN Take 5 mLs by mouth 4 (four) times daily as needed for mouth pain. 240 mL 2   Methylcobalamin (B-12) 5000 MCG TBDP Take 5,000 mcg by mouth daily.     morphine 20 MG/5ML solution Take 1.3-2.5 mLs (5.2-10 mg total) by mouth every 2 (two) hours as needed for pain. 30 mL 0   ondansetron (ZOFRAN) 8 MG tablet Take 1 tablet (8 mg total) by mouth every 8 (eight) hours as needed for nausea or vomiting. 30 tablet 1   polyethylene glycol (MIRALAX / GLYCOLAX) 17 g packet Take 17 g by mouth 2 (two) times daily. 14 each 0   No current facility-administered medications for this visit.     PHYSICAL EXAMINATION: ECOG PERFORMANCE STATUS: {CHL ONC ECOG PS:[phone]}  There were no vitals filed for this visit. There were no vitals filed for this visit.  GENERAL:alert, no distress and comfortable SKIN: skin color, texture, turgor are normal, no rashes or significant lesions EYES: normal, Conjunctiva are pink and non-injected, sclera clear OROPHARYNX:no exudate, no erythema and lips, buccal mucosa, and tongue normal  NECK: supple, thyroid normal size, non-tender, without nodularity LYMPH:  no palpable lymphadenopathy in the cervical, axillary or inguinal LUNGS: clear to  auscultation and percussion with normal breathing effort HEART: regular rate & rhythm and no murmurs and no lower extremity edema ABDOMEN:abdomen soft, non-tender and normal bowel sounds Musculoskeletal:no cyanosis of digits and no clubbing  NEURO: alert & oriented x 3 with fluent speech, no focal motor/sensory deficits  LABORATORY DATA:  I have reviewed the data as listed CBC Latest Ref Rng & Units 02/10/2019 02/08/2019 02/08/2019  WBC 4.0 - 10.5 K/uL 10.3 11.1(H) 10.1  Hemoglobin 13.0 - 17.0 g/dL 11.9(L) 12.4(L) 11.7(L)  Hematocrit 39.0 - 52.0 % 36.6(L) 37.3(L) 35.5(L)  Platelets 150 - 400 K/uL 414(H) 463(H) 420(H)     CMP Latest Ref Rng & Units 02/11/2019 02/10/2019 02/08/2019  Glucose 70 - 99 mg/dL - 115(H) 139(H)  BUN 6 - 20 mg/dL - 14 11  Creatinine 0.61 - 1.24 mg/dL 0.97 0.83 0.82  Sodium 135 - 145 mmol/L - 134(L) 131(L)  Potassium 3.5 - 5.1 mmol/L - 4.6 3.9  Chloride 98 - 111 mmol/L - 99 100  CO2 22 - 32 mmol/L - 25 21(L)  Calcium 8.9 - 10.3 mg/dL - 8.8(L) 8.4(L)  Total Protein 6.5 - 8.1 g/dL - - -  Total Bilirubin 0.3 - 1.2 mg/dL - - -  Alkaline Phos 38 - 126 U/L - - -  AST 15 - 41 U/L - - -  ALT 0 - 44 U/L - - -      RADIOGRAPHIC STUDIES: I have personally reviewed the radiological images as listed and agreed with the findings in the report. No results found.   ASSESSMENT & PLAN:  No problem-specific Assessment & Plan notes found for this encounter.   No orders of the defined types were placed in this encounter.  All questions were answered. The patient knows to call the clinic with any problems, questions or concerns. No barriers to learning was detected. I spent {CHL ONC TIME VISIT - MBOBO:[id number]} counseling the patient face to face. The total time spent in the appointment was {CHL ONC TIME VISIT - NHRVA:[id number]} and more than 50% was on counseling and review of test results     Alla Feeling, NP 02/15/19

## 2019-02-16 ENCOUNTER — Inpatient Hospital Stay: Payer: Managed Care, Other (non HMO) | Attending: Family Medicine

## 2019-02-16 ENCOUNTER — Inpatient Hospital Stay: Payer: Managed Care, Other (non HMO)

## 2019-02-16 ENCOUNTER — Ambulatory Visit: Payer: Managed Care, Other (non HMO) | Admitting: Nurse Practitioner

## 2019-02-16 ENCOUNTER — Telehealth: Payer: Self-pay | Admitting: Nurse Practitioner

## 2019-02-16 NOTE — Telephone Encounter (Signed)
I called patient's wife to f/u on hospital discharge with hospice, to ensure pt is comfortable and needs are met. I reviewed he has an appt today but OK to cancel per pt preference. No answer. She had personalized voicemail, I left a message requesting she return my call about today's appointment.  Cira Rue, NP  02/16/19

## 2019-02-23 ENCOUNTER — Telehealth: Payer: Self-pay

## 2019-02-23 NOTE — Telephone Encounter (Signed)
Patient's wife [name] called to let us know that he passed away 03/15/2023 am, I expressed our condolences.

## 2019-03-07 DEATH — deceased

## 2019-08-16 IMAGING — US US ABDOMEN LIMITED
1 series · 14 of 18 positions shown · non-contrast
Comparison: Chest CT 07/02/2018 and MRI 06/09/2018

CLINICAL DATA: 57-year-old with pancreatic cancer and concern for
liver lesions on recent chest CT. Evaluate for liver lesions and
possible liver lesion biopsy.

EXAM:
ULTRASOUND ABDOMEN LIMITED

[Series 1: us abdomen limited · 14 of 18 slices shown]
[im 1/18]
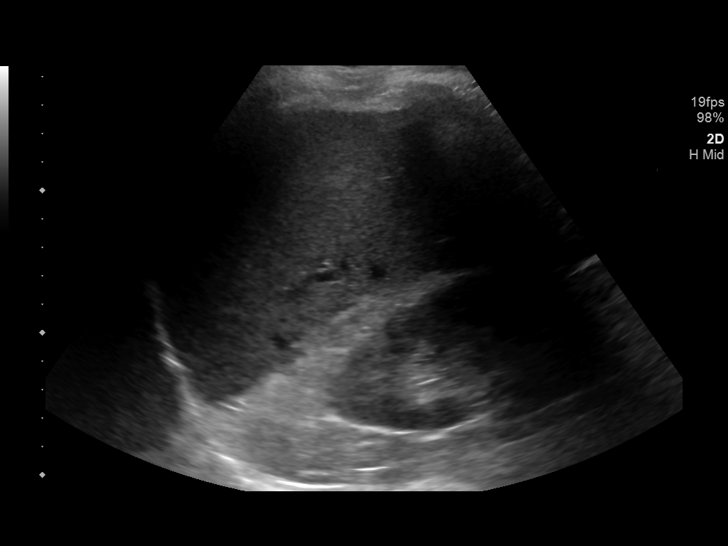
[im 2/18]
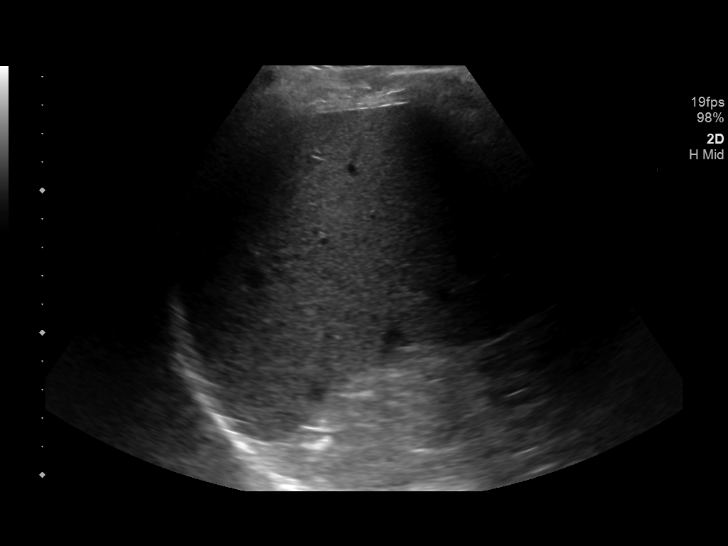
[im 4/18]
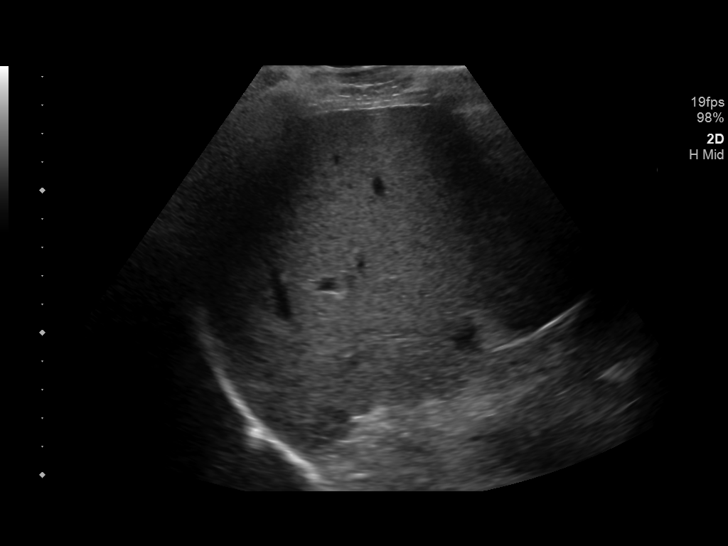
[im 5/18]
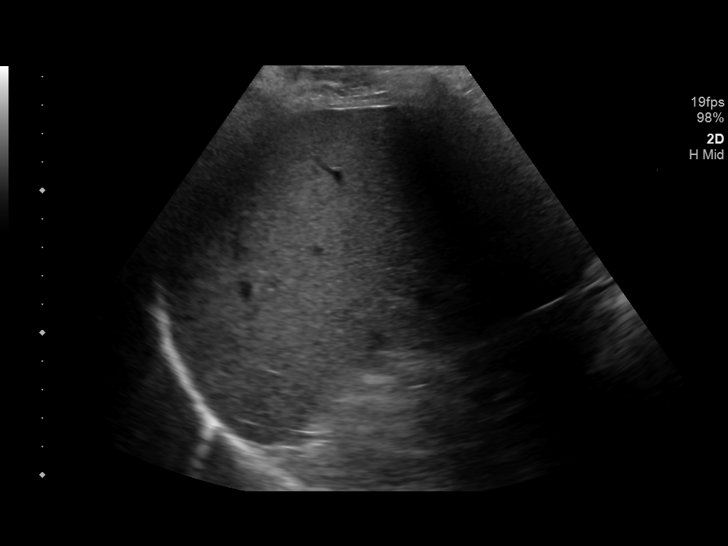
[im 6/18]
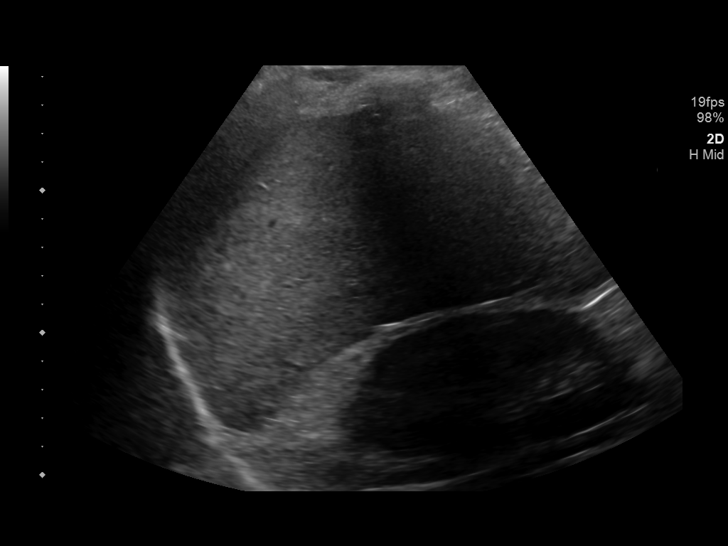
[im 8/18]
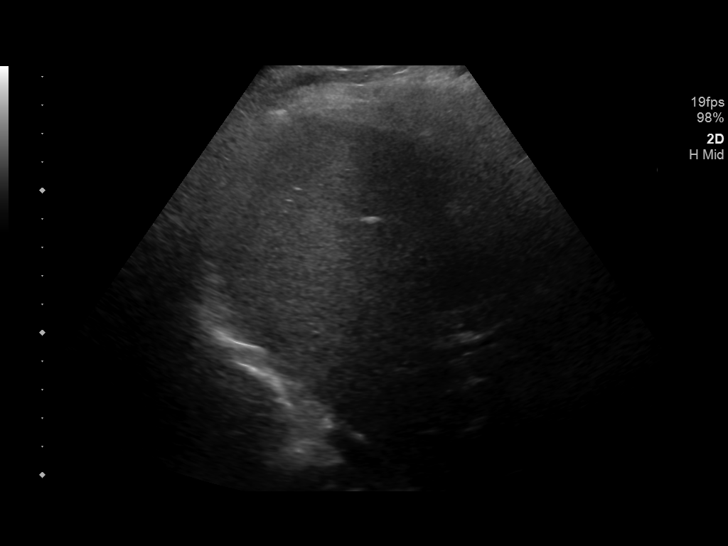
[im 9/18]
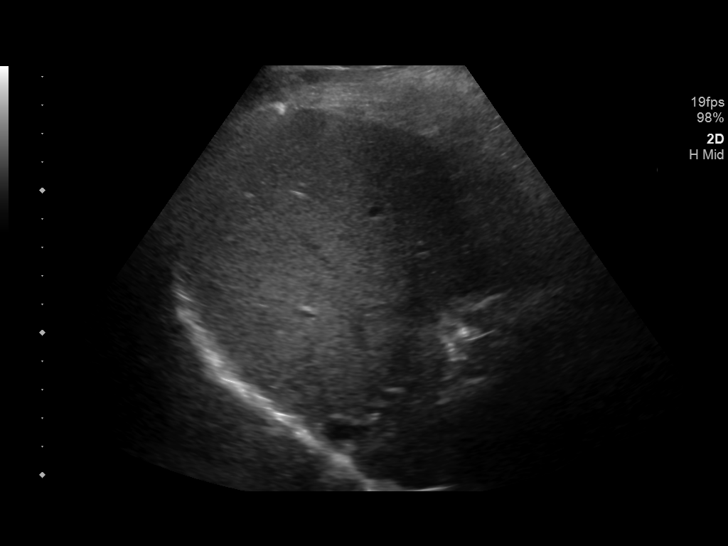
[im 10/18]
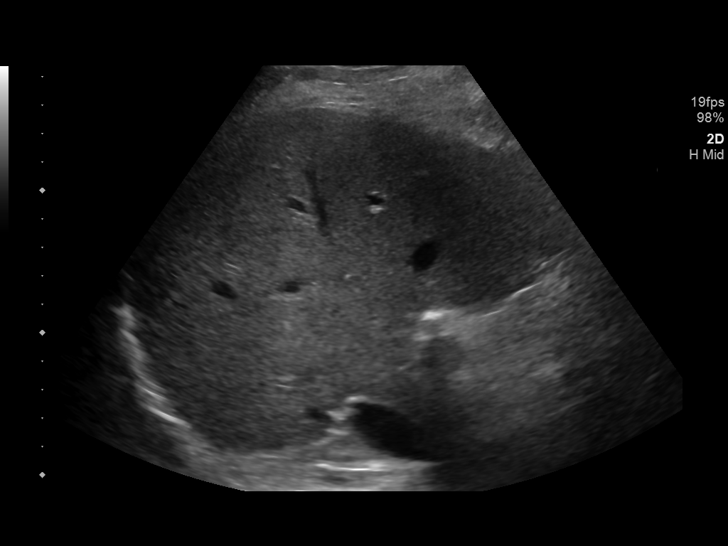
[im 11/18]
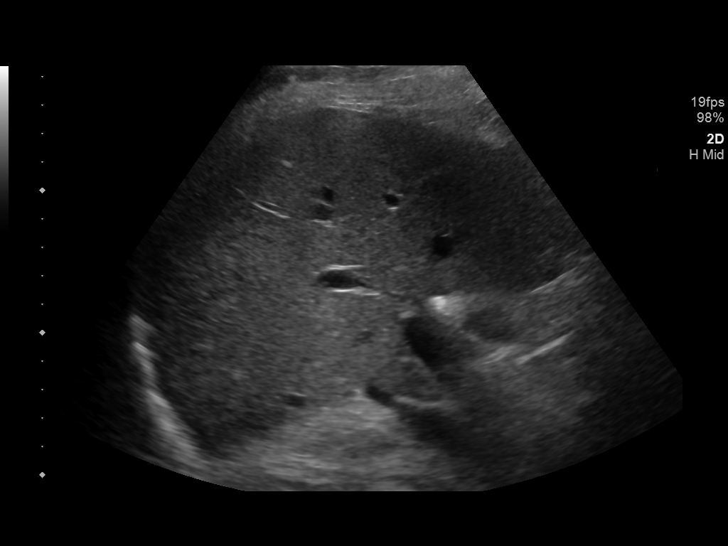
[im 13/18]
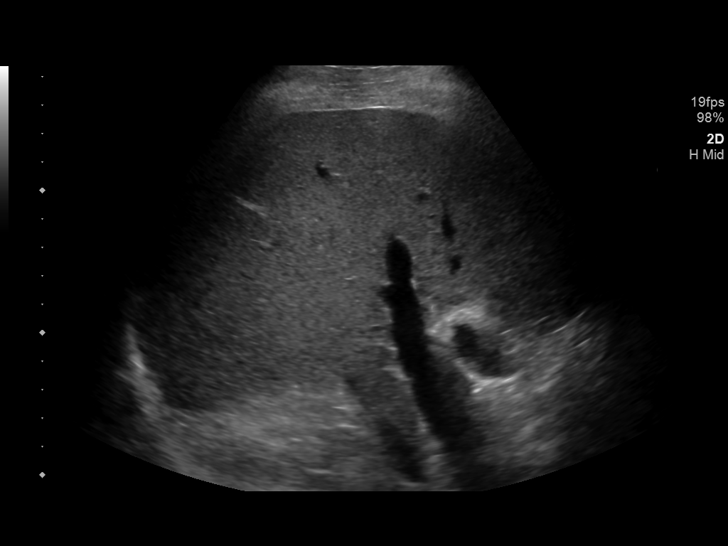
[im 14/18]
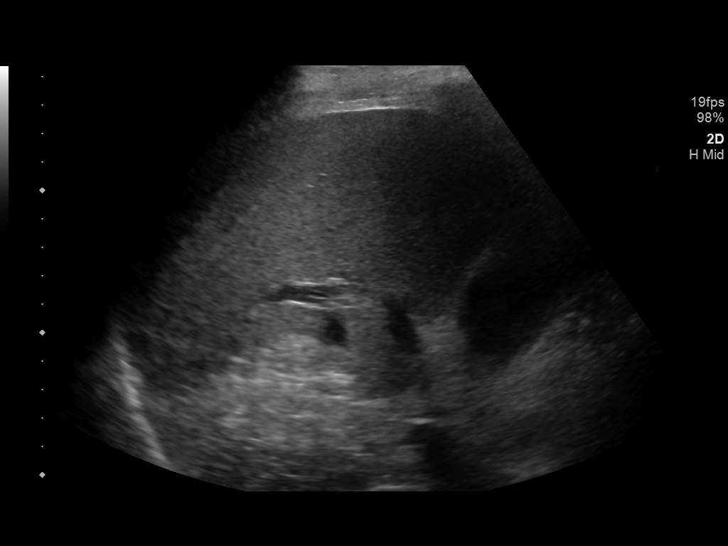
[im 15/18]
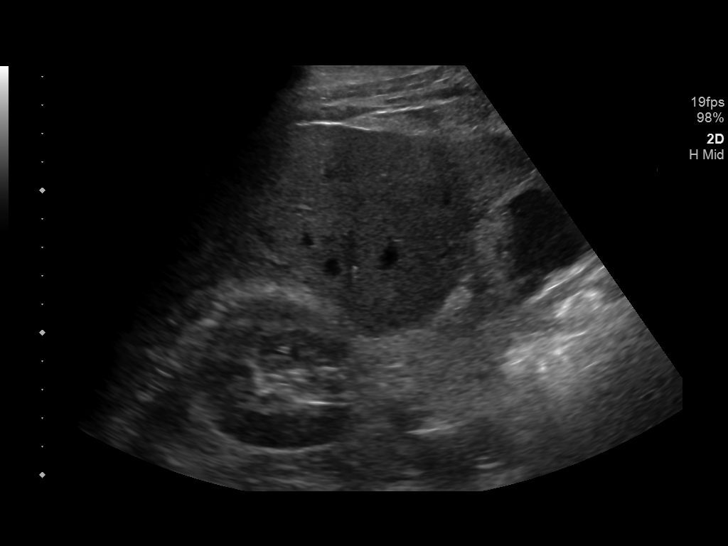
[im 17/18]
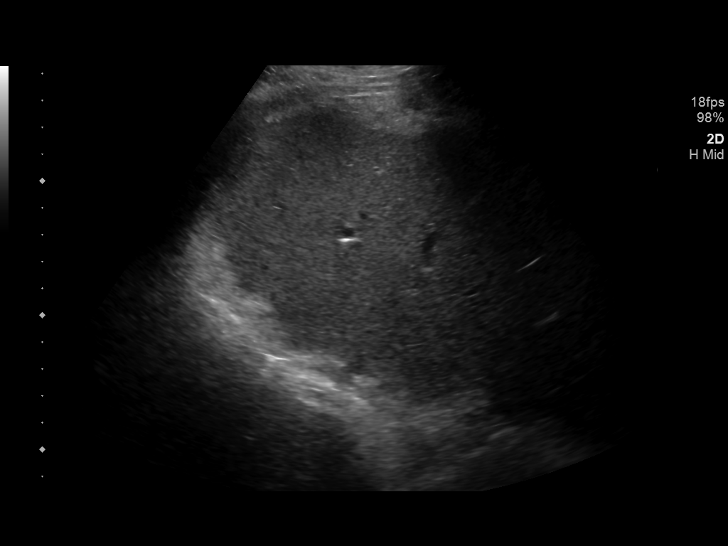
[im 18/18]
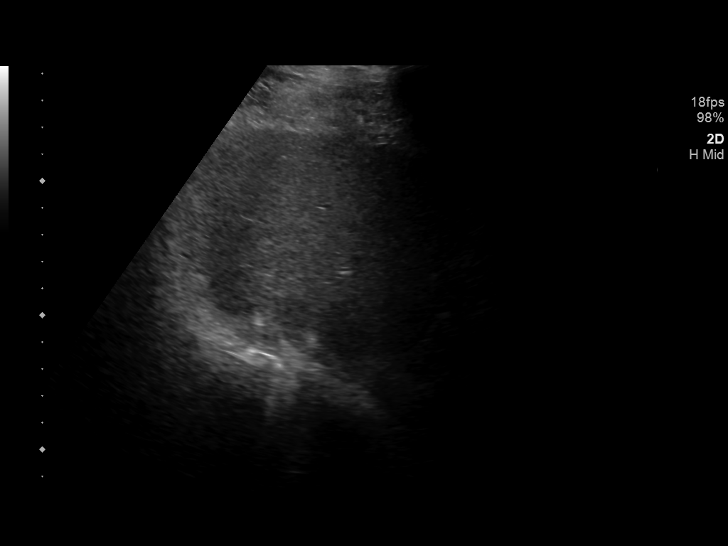

[14 of 18 positions shown; findings below may reference images not displayed]

FINDINGS: No discrete liver lesion identified. Echogenic material within the
biliary system compatible with pneumobilia related to the biliary
stent.
IMPRESSION: No liver lesions identified with ultrasound. Ultrasound-guided
biopsy was not performed. Recommend further characterization for
liver lesions with a repeat MRI, with and without contrast.

## 2019-08-19 IMAGING — CR DG CHEST 2V
2 series · 2 of 2 positions shown · non-contrast
Comparison: None.

CLINICAL DATA: Shortness of breath

EXAM:
CHEST - 2 VIEW

[w chest pa]
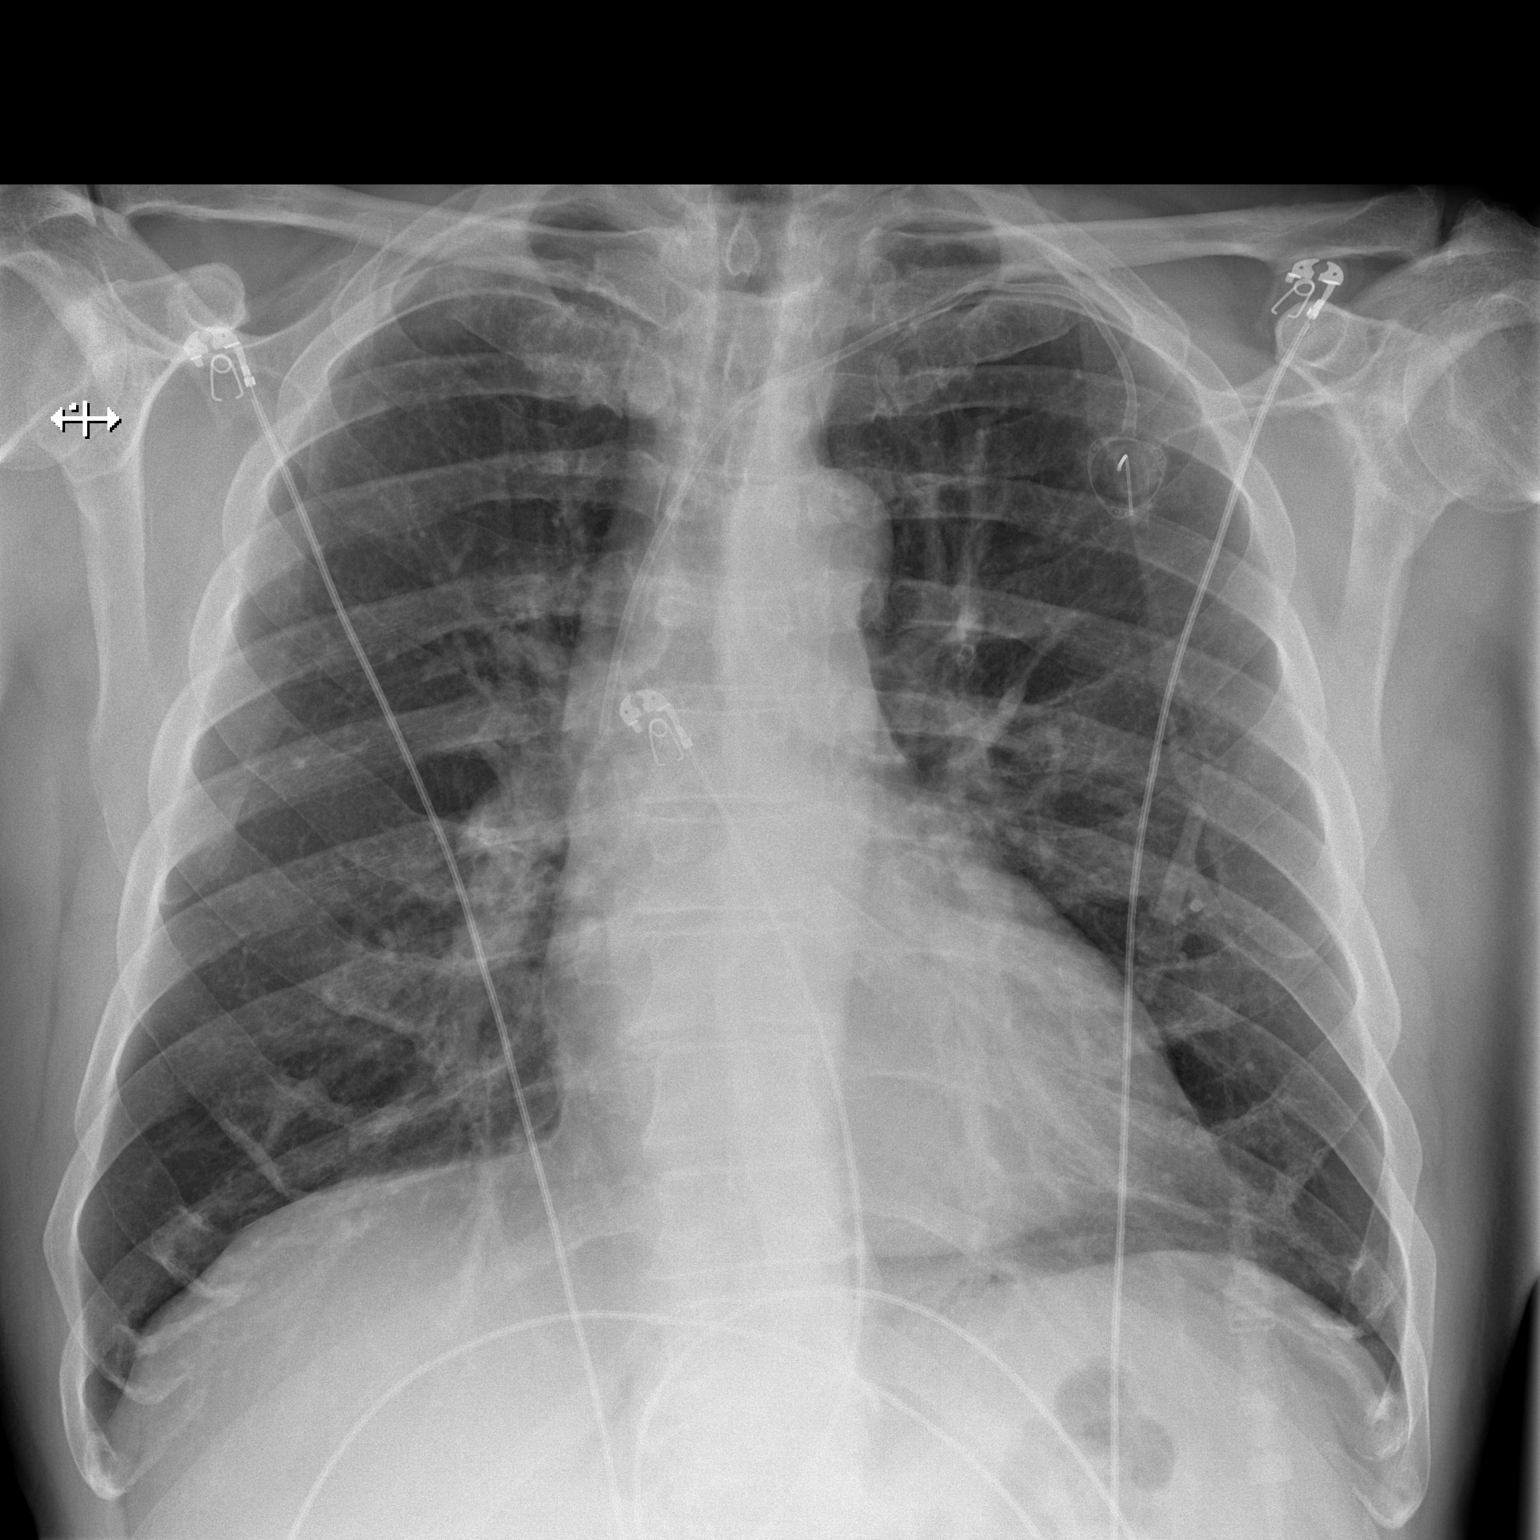

[w chest lat]
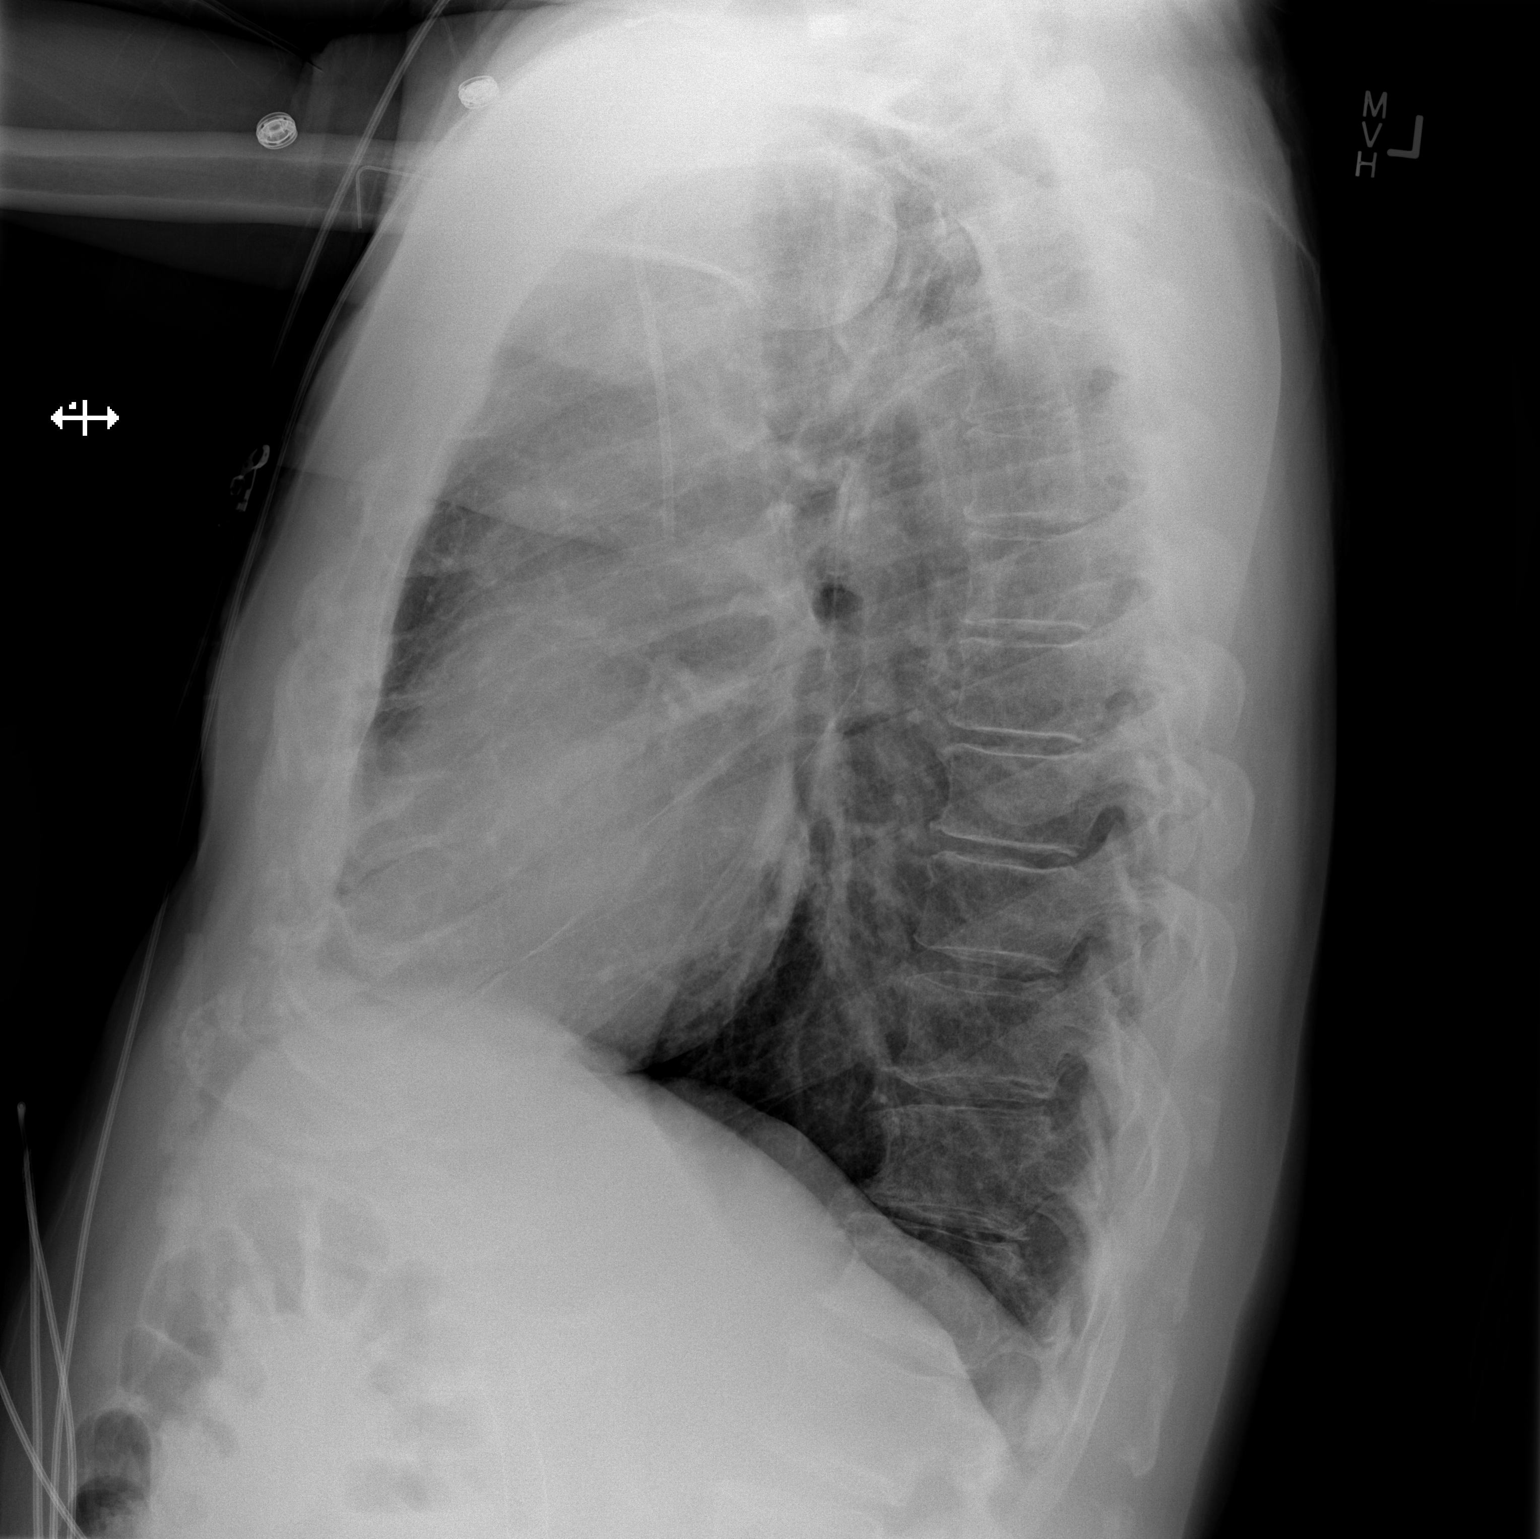

[2 of 2 positions shown; findings below may reference images not displayed]

FINDINGS: The heart size and mediastinal contours are within normal limits.
Both lungs are clear. The visualized skeletal structures are
unremarkable. Left chest wall Port-A-Cath tip in the lower SVC.
IMPRESSION: No active cardiopulmonary disease.

## 2019-10-18 IMAGING — MR MR MRA CHEST W/ OR W/O CM
22 of 24 series · 22 of 24 positions shown · IV contrast (gadavist)
Comparison: CT 07/02/2018

CLINICAL DATA: Op, hx pancreatic adenocarcinoma, new onset of left
arm/scubclavian area swelling, eval poss dvt Proto per Dr
Hemingway, gfr > 60

EXAM:
MRA CHEST WITH OR WITHOUT CONTRAST
TECHNIQUE: Angiographic images of the chest were obtained using MRA technique
before and after intravenous contrast.
CONTRAST:  8 mL Gadavist IV

[Series 3: bSSFP · axial · 8.0mm · 1.60mm/px · 1 of 43 slices shown (1 of 4)]
[im 1/43]
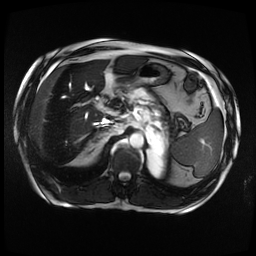

[Series 4: bSSFP · coronal · 8.0mm · 1.64mm/px · 1 of 36 slices shown (2 of 4)]
[im 1/36]
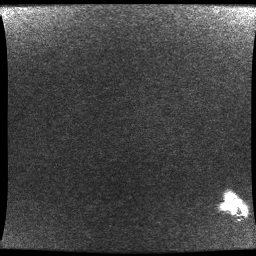

[Series 5: ax ssfse fs · axial · 8.0mm · 0.80mm/px · 1 of 50 slices shown]
[im 1/50]
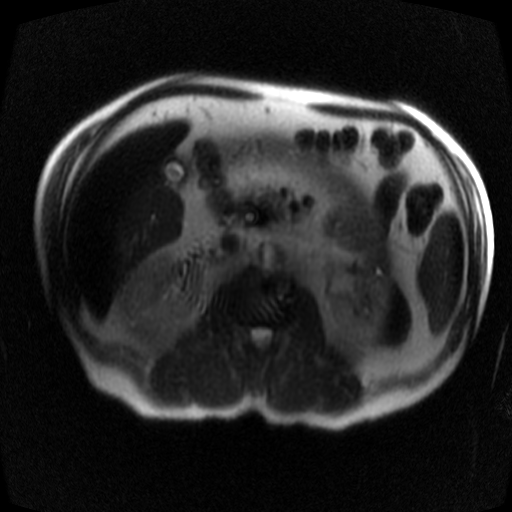

[Series 6: ax ssfse · axial · 8.0mm · 0.80mm/px · 1 of 43 slices shown (1 of 2)]
[im 1/43]
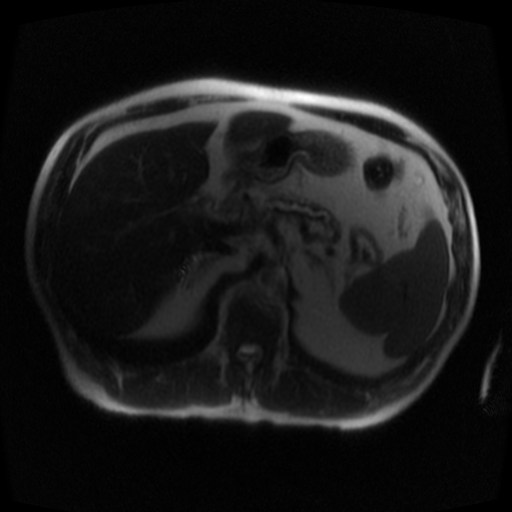

[Series 7: cor ssfse fs · coronal · 8.0mm · 0.82mm/px · 1 of 36 slices shown]
[im 1/36]
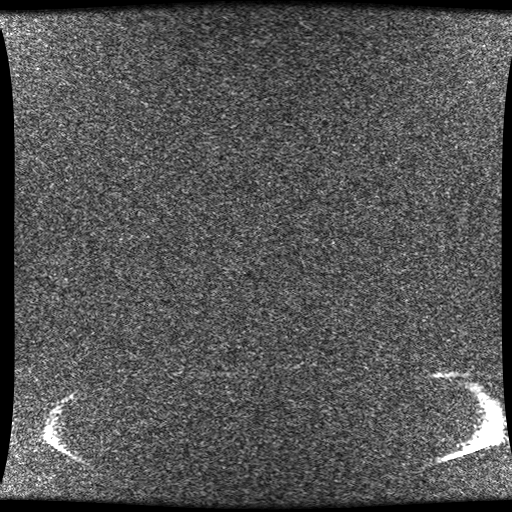

[Series 10: T1 · axial · 8.0mm · 0.80mm/px · 1 of 39 slices shown]
[im 1/39]
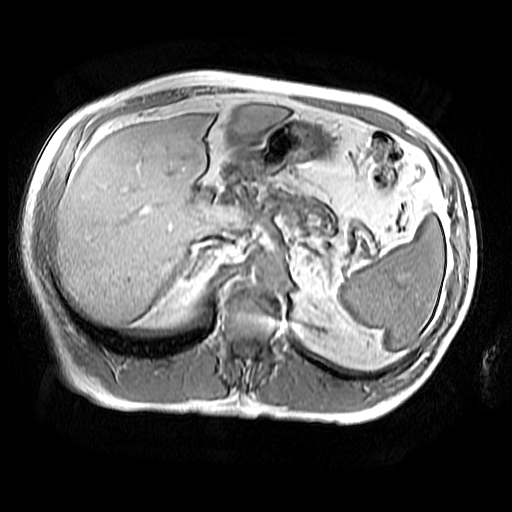

[Series 11: T1 dynamic · axial · non-contrast · 6.4mm · 0.78mm/px · 1 of 112 slices shown]
[im 1/112]
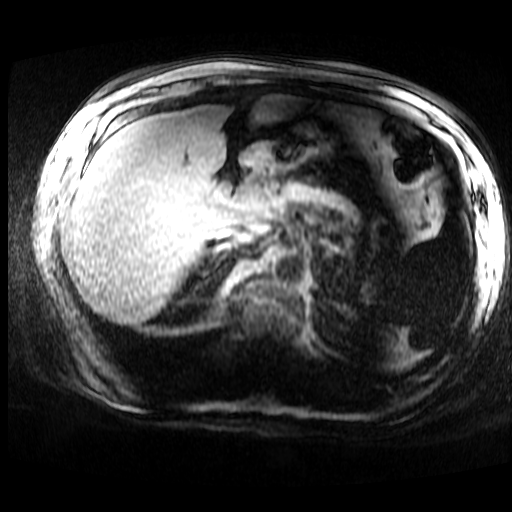

[Series 12: TOF · axial · 3.0mm · 1.56mm/px · 1 of 223 slices shown]
[im 1/223]
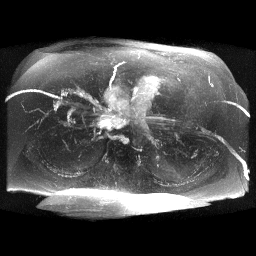

[Series 13: bSSFP · axial · 6.0mm · 1.41mm/px · 1 of 31 slices shown (3 of 4)]
[im 1/31]
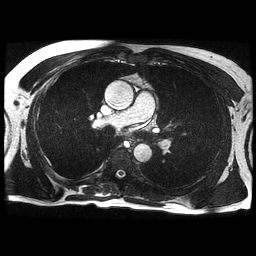

[Series 14: bSSFP · coronal · 5.0mm · 1.37mm/px · 1 of 36 slices shown (4 of 4)]
[im 1/36]
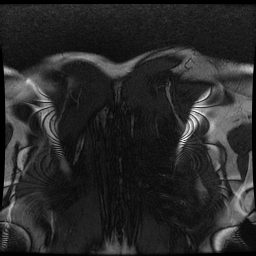

[Series 15: ax ssfse · axial · 8.0mm · 0.80mm/px · 1 of 24 slices shown (2 of 2)]
[im 1/24]
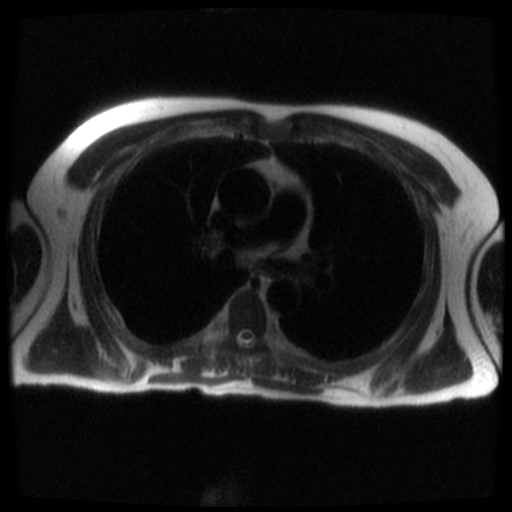

[Series 17: T1 dynamic post-contrast · axial · 6.4mm · 0.78mm/px · 1 of 112 slices shown (1 of 3)]
[im 1/112]
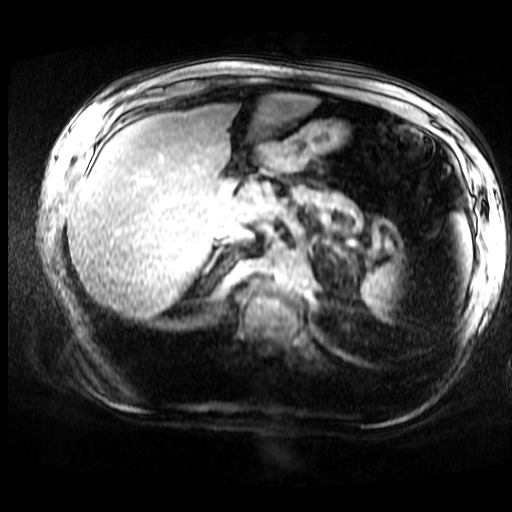

[Series 18: T1 dynamic post-contrast · coronal · 6.0mm · 0.72mm/px · 1 of 80 slices shown (2 of 3)]
[im 1/80]
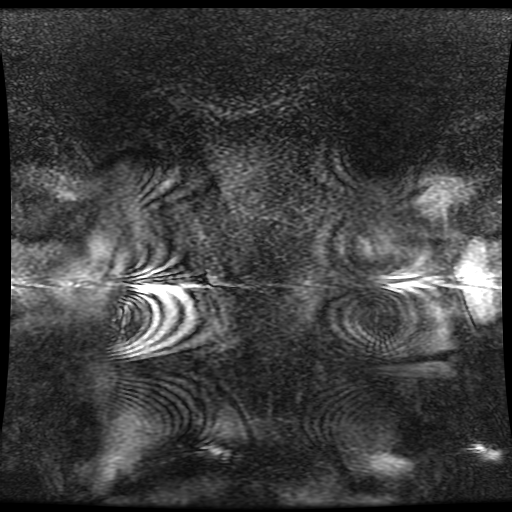

[Series 19: T1 dynamic post-contrast · axial · 4.4mm · 0.57mm/px · 1 of 112 slices shown (3 of 3)]
[im 1/112]
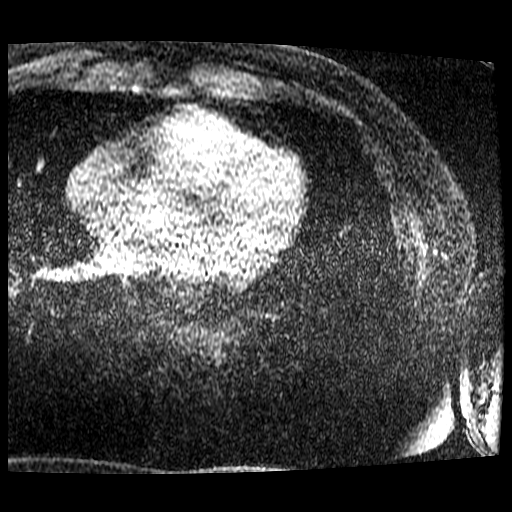

[Series 1200: pjn · oblique · 3.0mm · 0.78mm/px · 1 of 8 slices shown (1 of 2)]
[im 1/8]
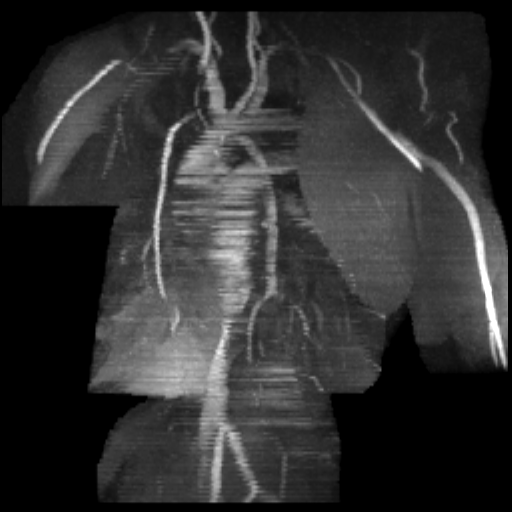

[Series 1600: MRA · coronal · 5.0mm · 0.70mm/px · 1 of 68 slices shown (1 of 3)]
[im 1/68]
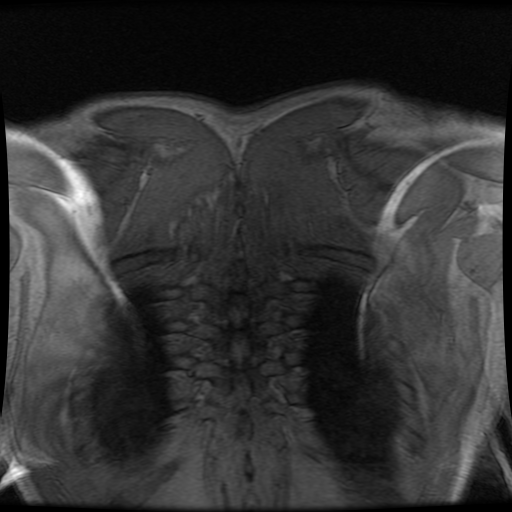

[Series 1602: MRA · coronal · 5.0mm · 0.70mm/px · 1 of 68 slices shown (2 of 3)]
[im 1/68]
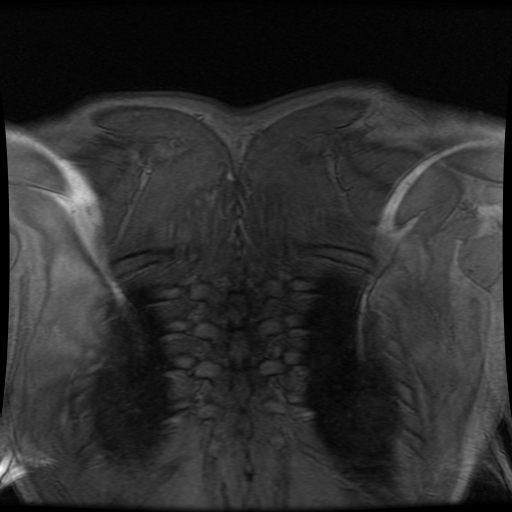

[Series 1603: MRA · coronal · 5.0mm · 0.70mm/px · 1 of 68 slices shown (3 of 3)]
[im 1/68]
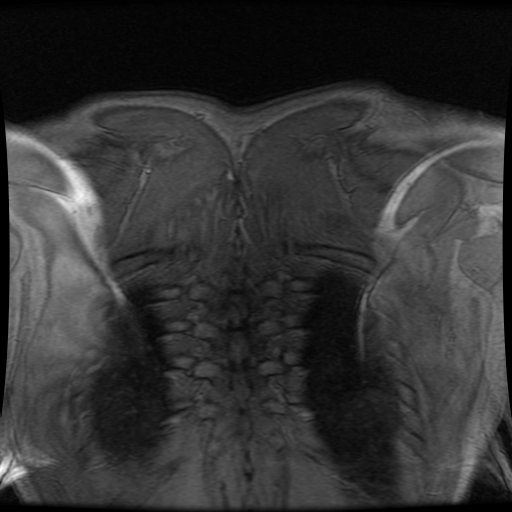

[((id)/(id)/1)-((id)/(id)/1) · coronal · 5.0mm · 0.70mm/px · 1 of 68 slices shown (1 of 3)]
[im 1/68]
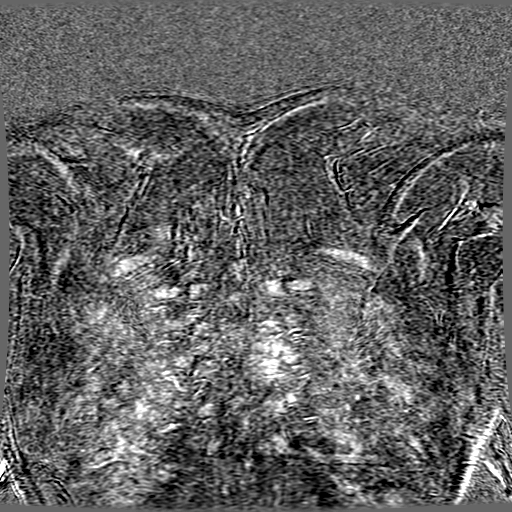

[((id)/(id)/1)-((id)/(id)/1) · coronal · 5.0mm · 0.70mm/px · 1 of 68 slices shown (2 of 3)]
[im 1/68]
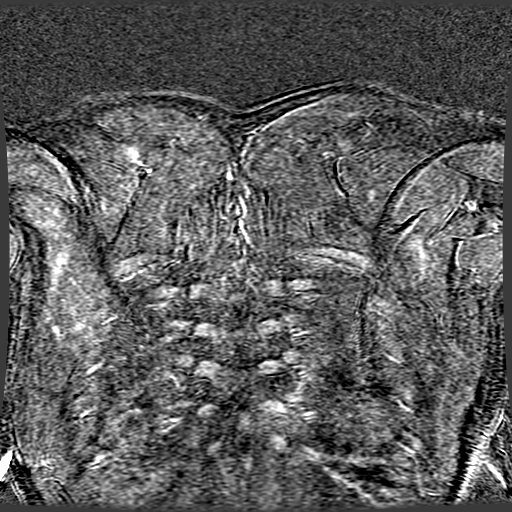

[((id)/(id)/1)-((id)/(id)/1) · coronal · 5.0mm · 0.70mm/px · 1 of 68 slices shown (3 of 3)]
[im 1/68]
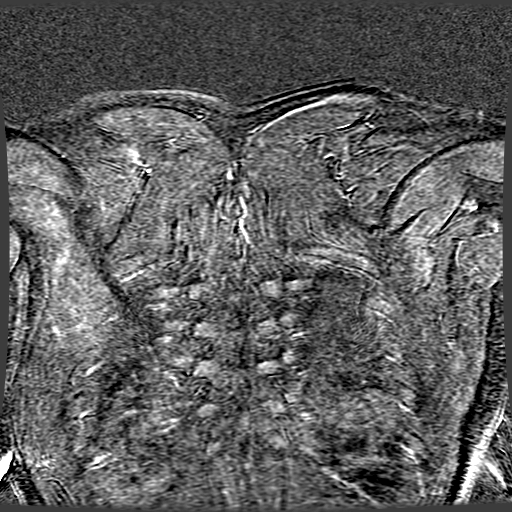

[pjn · sagittal · 5.0mm · 0.70mm/px · 1 of 3 slices shown (2 of 2)]
[im 1/3]
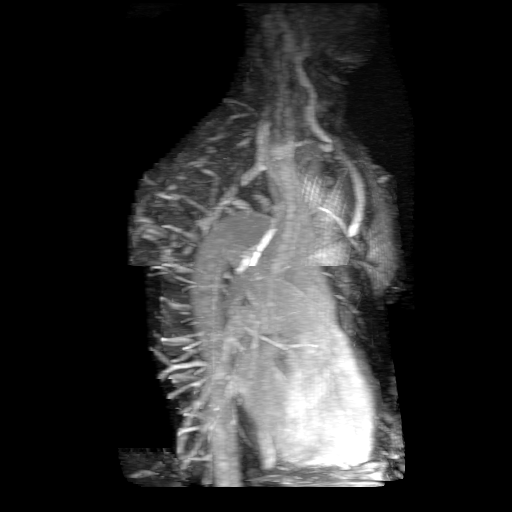

[22 of 24 positions shown; findings below may reference images not displayed]

FINDINGS: VASCULAR

Aorta: Mild dilatation of the ascending thoracic segment up to
cm diameter the arch and descending thoracic aorta is unremarkable.
Patent 3 vessel brachiocephalic arterial origin anatomy without
proximal stenosis..

Heart:   Normal in size.  No pericardial effusion.

Pulmonary Arteries: Unremarkable. Limited evaluation of segmental
and subsegmental branches.

Other: Left subclavian port catheter to the distal SVC. Left
subclavian axillary vein and peripheral aspect of left subclavian
vein patent. Thrombus in the central left subclavian vein and
innominate vein, at least partially occlusive. SVC remains patent.

NON-VASCULAR

Spinal cord: Negative limited evaluation

Brachial plexus: no mass or adenopathy.

Muscles and tendons: Negative limited evaluation

Bones: Negative limited evaluation

Joints: Not evaluated
IMPRESSION: VASCULAR

1. Thrombus in the central left subclavian vein and innominate vein,
at least partially occlusive.
2. Left subclavian port catheter to the SVC.
3. SVC is patent

NON-VASCULAR

1. No acute findings.

## 2021-04-01 NOTE — Telephone Encounter (Signed)
This encounter was created in error - please disregard.
